# Patient Record
Sex: Male | Born: 1960 | Race: White | Hispanic: No | Marital: Single | Smoking: Never smoker
Health system: Southern US, Community
[De-identification: ages and names within clinical notes are randomized; demographics above are authoritative.]

## PROBLEM LIST (undated history)

## (undated) DIAGNOSIS — J45909 Unspecified asthma, uncomplicated: Secondary | ICD-10-CM

## (undated) DIAGNOSIS — I10 Essential (primary) hypertension: Secondary | ICD-10-CM

## (undated) DIAGNOSIS — K760 Fatty (change of) liver, not elsewhere classified: Secondary | ICD-10-CM

## (undated) DIAGNOSIS — R37 Sexual dysfunction, unspecified: Secondary | ICD-10-CM

## (undated) DIAGNOSIS — E785 Hyperlipidemia, unspecified: Secondary | ICD-10-CM

## (undated) DIAGNOSIS — E669 Obesity, unspecified: Secondary | ICD-10-CM

## (undated) DIAGNOSIS — E559 Vitamin D deficiency, unspecified: Secondary | ICD-10-CM

## (undated) DIAGNOSIS — E119 Type 2 diabetes mellitus without complications: Secondary | ICD-10-CM

## (undated) DIAGNOSIS — D497 Neoplasm of unspecified behavior of endocrine glands and other parts of nervous system: Secondary | ICD-10-CM

## (undated) DIAGNOSIS — F5104 Psychophysiologic insomnia: Secondary | ICD-10-CM

## (undated) DIAGNOSIS — R7303 Prediabetes: Secondary | ICD-10-CM

## (undated) DIAGNOSIS — K219 Gastro-esophageal reflux disease without esophagitis: Secondary | ICD-10-CM

## (undated) DIAGNOSIS — R040 Epistaxis: Secondary | ICD-10-CM

## (undated) HISTORY — DX: Unspecified asthma, uncomplicated: J45.909

## (undated) HISTORY — DX: Fatty (change of) liver, not elsewhere classified: K76.0

## (undated) HISTORY — DX: Gastro-esophageal reflux disease without esophagitis: K21.9

## (undated) HISTORY — DX: Hyperlipidemia, unspecified: E78.5

## (undated) HISTORY — DX: Neoplasm of unspecified behavior of endocrine glands and other parts of nervous system: D49.7

## (undated) HISTORY — DX: Psychophysiologic insomnia: F51.04

## (undated) HISTORY — DX: Epistaxis: R04.0

## (undated) HISTORY — DX: Essential (primary) hypertension: I10

## (undated) HISTORY — PX: NOSE SURGERY: SHX723

## (undated) HISTORY — DX: Obesity, unspecified: E66.9

## (undated) HISTORY — DX: Vitamin D deficiency, unspecified: E55.9

## (undated) HISTORY — PX: COLONOSCOPY: SHX174

## (undated) HISTORY — DX: Sexual dysfunction, unspecified: R37

## (undated) HISTORY — DX: Prediabetes: R73.03

## (undated) HISTORY — PX: HX OTHER: 2100001105

## (undated) HISTORY — PX: HX SHOULDER SURGERY: 2100001311

---

## 2002-05-17 HISTORY — PX: HX OTHER: 2100001105

## 2008-10-29 ENCOUNTER — Ambulatory Visit (HOSPITAL_COMMUNITY): Payer: Self-pay

## 2013-05-30 DIAGNOSIS — S93499A Sprain of other ligament of unspecified ankle, initial encounter: Secondary | ICD-10-CM

## 2013-05-30 HISTORY — DX: Sprain of other ligament of unspecified ankle, initial encounter: S93.499A

## 2016-03-30 DIAGNOSIS — M25521 Pain in right elbow: Secondary | ICD-10-CM | POA: Insufficient documentation

## 2016-03-30 HISTORY — DX: Pain in right elbow: M25.521

## 2016-09-28 DIAGNOSIS — M170 Bilateral primary osteoarthritis of knee: Secondary | ICD-10-CM | POA: Insufficient documentation

## 2017-09-13 DIAGNOSIS — K5900 Constipation, unspecified: Secondary | ICD-10-CM

## 2017-09-13 HISTORY — DX: Constipation, unspecified: K59.00

## 2017-12-13 DIAGNOSIS — S56519A Strain of other extensor muscle, fascia and tendon at forearm level, unspecified arm, initial encounter: Secondary | ICD-10-CM | POA: Insufficient documentation

## 2017-12-13 HISTORY — DX: Strain of other extensor muscle, fascia and tendon at forearm level, unspecified arm, initial encounter: S56.519A

## 2017-12-15 HISTORY — PX: ELBOW SURGERY: SHX618

## 2017-12-28 DIAGNOSIS — M7542 Impingement syndrome of left shoulder: Secondary | ICD-10-CM

## 2017-12-28 HISTORY — DX: Impingement syndrome of left shoulder: M75.42

## 2018-02-15 ENCOUNTER — Other Ambulatory Visit (INDEPENDENT_AMBULATORY_CARE_PROVIDER_SITE_OTHER): Payer: Self-pay | Admitting: Family Medicine

## 2018-02-15 NOTE — Telephone Encounter (Signed)
01/28/2018 1 11/30/2017 ZOLPIDEM TARTRATE 10 MG TABLET 21.0 21 RA TAL [id number] [facility] (4106) 3 Medicare [place]     01/18/2018 1 01/18/2018 OXYCODONE-ACETAMINOPHEN 10-325 84.0 28 JA BRE [id number] [facility] ([id number]    [place] checked provider does not need to review.

## 2018-02-15 NOTE — Telephone Encounter (Signed)
zolpidem (AMBIEN) 10 mg Oral Tablet          Sig: take 1 tablet by mouth at bedtime if needed    Disp:  21 Tab  Refills:  3     [facility] to [facility] [place].

## 2018-03-06 ENCOUNTER — Encounter (INDEPENDENT_AMBULATORY_CARE_PROVIDER_SITE_OTHER): Payer: Self-pay

## 2018-03-14 LAB — COMPREHENSIVE METABOLIC PANEL
ALBUMIN: 4.6 g/dL
ALKALINE PHOSPHATASE: 66 IU/L
ALT (SGPT): 37 IU/L
AST (SGOT): 22 IU/L
BILIRUBIN, TOTAL: 1.5 mg/dL
BUN: 13 mg/dL
CALCIUM: 9.7 mg/dL
CARBON DIOXIDE, TOTAL: 29 mmol/L
CHLORIDE: 99 mmol/L
CREATININE: 1 mg/dL
GLUCOSE: 104 mg/dL
POTASSIUM: 3.3 mmol/L
PROTEIN, TOTAL: 7.5 g/dL
SODIUM: 137 mmol/L

## 2018-03-14 LAB — VITAMIN D 25 HYDROXY: VITAMIN D 25 HYDROXY: 43

## 2018-03-14 LAB — HEPATITIS C ANTIBODY: HCV ANTIBODY QUALITATIVE: NONREACTIVE

## 2018-03-15 ENCOUNTER — Encounter (INDEPENDENT_AMBULATORY_CARE_PROVIDER_SITE_OTHER): Payer: Self-pay | Admitting: Family Medicine

## 2018-03-16 ENCOUNTER — Encounter (INDEPENDENT_AMBULATORY_CARE_PROVIDER_SITE_OTHER): Payer: Self-pay | Admitting: Family Medicine

## 2018-03-21 ENCOUNTER — Encounter (INDEPENDENT_AMBULATORY_CARE_PROVIDER_SITE_OTHER): Payer: Self-pay | Admitting: Family Medicine

## 2018-03-21 DIAGNOSIS — J4530 Mild persistent asthma, uncomplicated: Secondary | ICD-10-CM

## 2018-03-21 DIAGNOSIS — M47816 Spondylosis without myelopathy or radiculopathy, lumbar region: Secondary | ICD-10-CM

## 2018-03-21 DIAGNOSIS — F5101 Primary insomnia: Secondary | ICD-10-CM | POA: Insufficient documentation

## 2018-03-21 DIAGNOSIS — M542 Cervicalgia: Secondary | ICD-10-CM | POA: Insufficient documentation

## 2018-03-21 DIAGNOSIS — IMO0001 Reserved for inherently not codable concepts without codable children: Secondary | ICD-10-CM | POA: Insufficient documentation

## 2018-03-21 DIAGNOSIS — I10 Essential (primary) hypertension: Secondary | ICD-10-CM | POA: Insufficient documentation

## 2018-03-21 DIAGNOSIS — E559 Vitamin D deficiency, unspecified: Secondary | ICD-10-CM | POA: Insufficient documentation

## 2018-03-21 DIAGNOSIS — M549 Dorsalgia, unspecified: Secondary | ICD-10-CM

## 2018-03-21 DIAGNOSIS — K2960 Other gastritis without bleeding: Secondary | ICD-10-CM | POA: Insufficient documentation

## 2018-03-21 DIAGNOSIS — R079 Chest pain, unspecified: Secondary | ICD-10-CM | POA: Insufficient documentation

## 2018-03-21 DIAGNOSIS — G8929 Other chronic pain: Secondary | ICD-10-CM | POA: Insufficient documentation

## 2018-03-21 DIAGNOSIS — E785 Hyperlipidemia, unspecified: Secondary | ICD-10-CM | POA: Insufficient documentation

## 2018-03-21 DIAGNOSIS — S93499A Sprain of other ligament of unspecified ankle, initial encounter: Secondary | ICD-10-CM | POA: Insufficient documentation

## 2018-03-21 DIAGNOSIS — M25559 Pain in unspecified hip: Secondary | ICD-10-CM | POA: Insufficient documentation

## 2018-03-21 DIAGNOSIS — E782 Mixed hyperlipidemia: Secondary | ICD-10-CM | POA: Insufficient documentation

## 2018-03-21 DIAGNOSIS — E669 Obesity, unspecified: Secondary | ICD-10-CM | POA: Insufficient documentation

## 2018-03-21 HISTORY — DX: Pain in unspecified hip: M25.559

## 2018-03-21 HISTORY — DX: Gilbert syndrome: E80.4

## 2018-03-21 HISTORY — DX: Spondylosis without myelopathy or radiculopathy, lumbar region: M47.816

## 2018-03-21 HISTORY — DX: Cervicalgia: M54.2

## 2018-03-21 HISTORY — DX: Other chronic pain: G89.29

## 2018-03-21 HISTORY — DX: Chest pain, unspecified: R07.9

## 2018-03-21 HISTORY — DX: Dorsalgia, unspecified: M54.9

## 2018-03-21 HISTORY — DX: Mild persistent asthma, uncomplicated: J45.30

## 2018-03-21 HISTORY — DX: Reserved for inherently not codable concepts without codable children: IMO0001

## 2018-03-22 ENCOUNTER — Ambulatory Visit (INDEPENDENT_AMBULATORY_CARE_PROVIDER_SITE_OTHER): Payer: Medicare Other | Admitting: Family Medicine

## 2018-03-22 ENCOUNTER — Encounter (INDEPENDENT_AMBULATORY_CARE_PROVIDER_SITE_OTHER): Payer: Self-pay | Admitting: Family Medicine

## 2018-03-22 VITALS — BP 118/78 | HR 88 | Temp 98.3°F | Ht 69.0 in | Wt 236.2 lb

## 2018-03-22 DIAGNOSIS — R7303 Prediabetes: Secondary | ICD-10-CM

## 2018-03-22 DIAGNOSIS — I10 Essential (primary) hypertension: Principal | ICD-10-CM

## 2018-03-22 DIAGNOSIS — J029 Acute pharyngitis, unspecified: Secondary | ICD-10-CM

## 2018-03-22 DIAGNOSIS — F5101 Primary insomnia: Secondary | ICD-10-CM

## 2018-03-22 DIAGNOSIS — E785 Hyperlipidemia, unspecified: Secondary | ICD-10-CM

## 2018-03-22 DIAGNOSIS — M7918 Myalgia, other site: Secondary | ICD-10-CM

## 2018-03-22 DIAGNOSIS — M170 Bilateral primary osteoarthritis of knee: Secondary | ICD-10-CM

## 2018-03-22 MED ORDER — PHENOL 1.4 % MUCOSAL AEROSOL SPRAY
1.0000 | INHALATION_SPRAY | 3 refills | Status: DC | PRN
Start: 2018-03-22 — End: 2018-10-04
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
## 2018-04-07 ENCOUNTER — Encounter (INDEPENDENT_AMBULATORY_CARE_PROVIDER_SITE_OTHER): Payer: Self-pay | Admitting: Family Medicine

## 2018-04-20 ENCOUNTER — Telehealth (INDEPENDENT_AMBULATORY_CARE_PROVIDER_SITE_OTHER): Payer: Self-pay | Admitting: Family Medicine

## 2018-04-20 DIAGNOSIS — G894 Chronic pain syndrome: Secondary | ICD-10-CM

## 2018-04-20 NOTE — Telephone Encounter (Signed)
Pt is needing a new order for PT for his neck, lower back and right shoulder.   This gets sent to [facility] here in [place]. His next apt is tomorrow.

## 2018-04-21 NOTE — Telephone Encounter (Signed)
Ordered

## 2018-04-21 NOTE — Telephone Encounter (Signed)
Pt called back.  Made patient aware that referral was sent to [facility].

## 2018-04-21 NOTE — Telephone Encounter (Signed)
ORDER AND DEMO FOR PT FAXED TO [facility] IN [place] # [phone]- LM FOR PT

## 2018-04-24 ENCOUNTER — Telehealth (INDEPENDENT_AMBULATORY_CARE_PROVIDER_SITE_OTHER): Payer: Self-pay | Admitting: Internal Medicine

## 2018-04-24 ENCOUNTER — Other Ambulatory Visit (INDEPENDENT_AMBULATORY_CARE_PROVIDER_SITE_OTHER): Payer: Self-pay | Admitting: Family Medicine

## 2018-04-24 DIAGNOSIS — M170 Bilateral primary osteoarthritis of knee: Secondary | ICD-10-CM

## 2018-04-24 DIAGNOSIS — M7918 Myalgia, other site: Secondary | ICD-10-CM

## 2018-04-24 NOTE — Telephone Encounter (Signed)
Patient calling for new orders for PT, [facility].  Said he received a letter from [facility] stating that he needs another referral.

## 2018-04-24 NOTE — Nursing Note (Signed)
Last filled 03-29-18 (90 tablets, 0 refills)  LV 03-28-18  NV 07-27-17

## 2018-04-25 NOTE — Telephone Encounter (Signed)
Ok to complete

## 2018-04-25 NOTE — Telephone Encounter (Signed)
Okay to continue physical therapy, may get order.  Thank you

## 2018-04-26 NOTE — Telephone Encounter (Signed)
New order generated per Dr.Talaman.  Attempted to call patient to determine which [facility] where order is to be sent, no answer, left message to call back.

## 2018-04-27 NOTE — Telephone Encounter (Addendum)
I FAXED ORDER AND DEMO TO [facility] IN [place]- FAX # [phone]- WE DO NOT DO REFERRALS FOR [facility], NOR DO WE DO THEM FOR PT- THEY GET AUTHORIZATIONS ON OWN - CHART STATES HE USES [facility] IN [place]
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
## 2018-05-11 ENCOUNTER — Other Ambulatory Visit (INDEPENDENT_AMBULATORY_CARE_PROVIDER_SITE_OTHER): Payer: Self-pay | Admitting: Family Medicine

## 2018-05-11 NOTE — Telephone Encounter (Signed)
Last filled- 02/15/18 #21, r-3  Refill rejected

## 2018-05-19 ENCOUNTER — Other Ambulatory Visit (INDEPENDENT_AMBULATORY_CARE_PROVIDER_SITE_OTHER): Payer: Self-pay | Admitting: Family Medicine

## 2018-05-19 NOTE — Telephone Encounter (Signed)
NV: 07/21/18  LV: 03/22/18      LF: 04/24/18 #90/0

## 2018-06-01 ENCOUNTER — Other Ambulatory Visit (INDEPENDENT_AMBULATORY_CARE_PROVIDER_SITE_OTHER): Payer: Self-pay | Admitting: Family Medicine

## 2018-06-01 NOTE — Telephone Encounter (Signed)
Reviewed active medication list. Parameters for refill met per departmental refill protocol. Refill sent electronically to pharmacy as noted in the signed orders section of this encounter. Order forwarded to the provider for co-signature.

## 2018-06-06 ENCOUNTER — Other Ambulatory Visit (INDEPENDENT_AMBULATORY_CARE_PROVIDER_SITE_OTHER): Payer: Self-pay | Admitting: Family Medicine

## 2018-06-07 ENCOUNTER — Telehealth (INDEPENDENT_AMBULATORY_CARE_PROVIDER_SITE_OTHER): Payer: Self-pay | Admitting: Family Medicine

## 2018-06-07 NOTE — Telephone Encounter (Signed)
Patient requesting refill of medication     Last ov 03/22/18  Next ov 07/21/18  Last filled  04/22/2018   1   02/15/2018   ZOLPIDEM TARTRATE 10 MG TABLET   21.0   21   RA TAL   [id number]   [facility] (4106)       I have reviewed the patient's controlled substance prescription history from the [place] and my orders/prescriptions reflect my decisions based on the patient's condition, reason for care, my evaluation, and PDMP data.  I queried the PDMP and no physician review of PDMP is required according to criteria. Pended prescription routed to the provider for review and signature.     Medication pended for signature if appropriate.

## 2018-06-07 NOTE — Telephone Encounter (Signed)
Please refer to refill encounter from 06-06-18

## 2018-06-07 NOTE — Telephone Encounter (Signed)
Please see message below.  It looks on the PDMP as if he was given 3 refills 1 it was filled in December.  Therefore he should not need to script.

## 2018-06-07 NOTE — Telephone Encounter (Signed)
When I look on the PDMP it looks as if the script he was given in December had 3 refills?

## 2018-06-07 NOTE — Telephone Encounter (Signed)
Patient called office asking why his Ambien was not filled yet.    Informed patient of the 72 hour policy.     Spoke with pharmicist from [facility].  They did fill a script on 04-22-18 for a 21 day supply.  The script does not have any refills.  It was the 3rd refill from the original script  Dated 02-15-18.      Please advise

## 2018-06-08 NOTE — Telephone Encounter (Signed)
Started.  I am misinterpreted the PDMP.  No advise identified.  Script sent to pharmacy.

## 2018-06-13 ENCOUNTER — Other Ambulatory Visit (INDEPENDENT_AMBULATORY_CARE_PROVIDER_SITE_OTHER): Payer: Self-pay | Admitting: Family Medicine

## 2018-06-13 NOTE — Telephone Encounter (Signed)
Last OV: 03/22/18  Scheduled OV: 07/21/18  Last Filled:  05/19/18 #90 r-1    Gabapentin pended for refill.

## 2018-06-13 NOTE — Telephone Encounter (Signed)
Gabapentin script sent.

## 2018-06-25 ENCOUNTER — Other Ambulatory Visit (INDEPENDENT_AMBULATORY_CARE_PROVIDER_SITE_OTHER): Payer: Self-pay | Admitting: Family

## 2018-06-26 ENCOUNTER — Telehealth (INDEPENDENT_AMBULATORY_CARE_PROVIDER_SITE_OTHER): Payer: Self-pay | Admitting: Family Medicine

## 2018-06-26 NOTE — Telephone Encounter (Signed)
Fax from [facility] - symbicort not covered.  Only medication covered is pulmicort.  All others require PA.  Will route to privider to determine if PA should be completed.     Fax scanned to chart.

## 2018-06-26 NOTE — Telephone Encounter (Signed)
Refill requested too soon.

## 2018-06-26 NOTE — Telephone Encounter (Signed)
Last filled 06-08-18 (21 tablets, 0 refills)    LV 03-22-18  NV 07-21-18      I have reviewed the patients controlled substance prescription history from the [place] web site. Physician review of [place]APDMP is not required. Pended prescription routed to provider for review and signature.

## 2018-07-01 ENCOUNTER — Other Ambulatory Visit (INDEPENDENT_AMBULATORY_CARE_PROVIDER_SITE_OTHER): Payer: Self-pay | Admitting: Family Medicine

## 2018-07-01 ENCOUNTER — Other Ambulatory Visit (INDEPENDENT_AMBULATORY_CARE_PROVIDER_SITE_OTHER): Payer: Self-pay | Admitting: Family

## 2018-07-03 NOTE — Telephone Encounter (Signed)
Albuterol script sent.

## 2018-07-03 NOTE — Telephone Encounter (Signed)
Patient requesting refill of medication     Last ov 03/22/18  Next ov 07/21/18  Last filled:  06/08/2018   1   06/08/2018   ZOLPIDEM TARTRATE 10 MG TABLET   21.0   21   TA M C   [id number]   [facility] (4106)   0     Medicare   [place]      I have reviewed the patient's controlled substance prescription history from the [place] and my orders/prescriptions reflect my decisions based on the patient's condition, reason for care, my evaluation, and PDMP data.  I queried the PDMP and no physician review of PDMP is required according to criteria. Pended prescription routed to the provider for review and signature.       Medication pended for signature if appropriate.

## 2018-07-03 NOTE — Telephone Encounter (Signed)
Patient requesting refill of medication     Last ov 03/22/18  Next ov 07/21/18    Medication pended for signature if appropriate.

## 2018-07-03 NOTE — Telephone Encounter (Signed)
Refill declined.  Too soon for refill.  Medication not to be used on a daily basis.

## 2018-07-04 NOTE — Telephone Encounter (Signed)
Spoke with patient and advised medication refused.  Patient yelling into phone "I have been taking this medication every night for years!  Without if I don't sleep at all!".  Asked patient if he had ever tried anything else for sleep. Patient says that he has tried two different medications in the past, one made him sick and the other did not work so Dr.Talaman placed him back on [place].     Patient says that he does require this medication nightly as he does not sleep without it.

## 2018-07-08 ENCOUNTER — Other Ambulatory Visit (INDEPENDENT_AMBULATORY_CARE_PROVIDER_SITE_OTHER): Payer: Self-pay | Admitting: Internal Medicine

## 2018-07-10 ENCOUNTER — Telehealth (INDEPENDENT_AMBULATORY_CARE_PROVIDER_SITE_OTHER): Payer: Self-pay | Admitting: Family Medicine

## 2018-07-10 NOTE — Telephone Encounter (Signed)
Gabapentin script sent.

## 2018-07-10 NOTE — Telephone Encounter (Signed)
Received a call from the patient asking about his Ambien medication.     Advised patient per telephone encounter from 07/04/18 that we are unable to refill as Dr. Lockie Pares determined it was too soon for the refill.    Patient began yelling at me stating his Ambien is gone and he only got a 21 day supply. I re-educated the patient that the medication is not to be used on a daily basis.     He began yelling again that he needs this medication.     I was advising that Dr. Carlisle Cater returns Wed and we could discuss this with her but at this time it would not be refilled.     The patient hung up.

## 2018-07-10 NOTE — Telephone Encounter (Signed)
NV: 07/21/18  LV: 03/22/18     LF: 06/13/18 #90/0

## 2018-07-12 ENCOUNTER — Other Ambulatory Visit (INDEPENDENT_AMBULATORY_CARE_PROVIDER_SITE_OTHER): Payer: Self-pay | Admitting: Family Medicine

## 2018-07-12 MED ORDER — ZOLPIDEM 10 MG TABLET
ORAL_TABLET | ORAL | 0 refills | Status: DC
Start: 2018-07-12 — End: 2018-08-01

## 2018-07-12 NOTE — Telephone Encounter (Signed)
Patient called requesting refill of Ambien. He was told while Dr. Carlisle Cater was out that he would have to await her return. Medication pended.     Last OV: 03/22/18  Scheduled OV: 07/21/18  Last Filled:  06/08/18 #21 r-0    I have reviewed the patient's controlled substance prescription history from the [place] and my orders/prescriptions reflect my decisions based on the patient's condition, reason for care, my evaluation, and PDMP data.  I queried the PDMP and no physician review of PDMP is required according to criteria. Pended prescription routed to MD for review and signature.

## 2018-07-17 ENCOUNTER — Other Ambulatory Visit (INDEPENDENT_AMBULATORY_CARE_PROVIDER_SITE_OTHER): Payer: Self-pay | Admitting: Family Medicine

## 2018-07-21 ENCOUNTER — Encounter (INDEPENDENT_AMBULATORY_CARE_PROVIDER_SITE_OTHER): Payer: Self-pay | Admitting: Family Medicine

## 2018-07-29 ENCOUNTER — Other Ambulatory Visit (INDEPENDENT_AMBULATORY_CARE_PROVIDER_SITE_OTHER): Payer: Self-pay | Admitting: Family Medicine

## 2018-08-01 ENCOUNTER — Other Ambulatory Visit (INDEPENDENT_AMBULATORY_CARE_PROVIDER_SITE_OTHER): Payer: Self-pay | Admitting: Family Medicine

## 2018-08-01 ENCOUNTER — Telehealth (INDEPENDENT_AMBULATORY_CARE_PROVIDER_SITE_OTHER): Payer: Self-pay | Admitting: Family Medicine

## 2018-08-01 MED ORDER — OMEPRAZOLE 40 MG CAPSULE,DELAYED RELEASE: Cap | ORAL | 1 refills | 0 days | Status: CN

## 2018-08-01 MED ORDER — OMEPRAZOLE 40 MG CAPSULE,DELAYED RELEASE: Cap | ORAL | 0 refills | 0 days | Status: AC

## 2018-08-01 NOTE — Telephone Encounter (Signed)
I will refill his Prilosec and also agrees with going to the ER for further evaluation and management.  Thank you

## 2018-08-01 NOTE — Telephone Encounter (Signed)
Reviewed active medication list.  Parameters for refill/s met per departmental refill protocol.  Refill sent electronically/called into pharmacy as noted in the signed orders section of this encounter.  Order forwarded to provider for co-signature.

## 2018-08-01 NOTE — Telephone Encounter (Signed)
Last filled 07-12-18 (21 tablets, 0 refills)  LV 07-21-18  NV 10-04-18      I have reviewed the patients controlled substance prescription history from the PAPDMP web site. Physician review of [place]DMP is not required. Pended prescription routed to provider for review and signature.

## 2018-08-01 NOTE — Telephone Encounter (Signed)
LMOM for patient to return call.

## 2018-08-01 NOTE — Telephone Encounter (Signed)
Call from patient, states that for the last 2 weeks he has been experiencing stomach pain and throat "burning" and says that he needs his Prilosec refilled because he has been out of refills. Prilosec sent in another encounter.     Also, patient says that for over two weeks he has been having episodes of chest tightness and pain, diaphoresis, fatigue and shortness of breath with activities.  Patient says that when he has this experience he tries to use inhalers and these do not help.  Advised patient to seek care at ED as these symptoms could be cardiac in nature.  Patient states that he does not want to go to ED.  Did again encourage him to go to ED to be examined.  Patient states "okay" and disconnected call.

## 2018-08-02 NOTE — Telephone Encounter (Signed)
Called patient, no answer, LMOM for patient to call office back.     Unable to contact letter mailed.

## 2018-08-04 ENCOUNTER — Other Ambulatory Visit (INDEPENDENT_AMBULATORY_CARE_PROVIDER_SITE_OTHER): Payer: Self-pay | Admitting: Internal Medicine

## 2018-08-04 NOTE — Telephone Encounter (Signed)
Last filled 07-10-18 (90 tablets, 0 refills)  LV 03-22-18  NV 10-04-18

## 2018-08-11 ENCOUNTER — Other Ambulatory Visit (INDEPENDENT_AMBULATORY_CARE_PROVIDER_SITE_OTHER): Payer: Self-pay | Admitting: Family Medicine

## 2018-08-11 NOTE — Telephone Encounter (Signed)
Patient requesting refill of medication     Last ov 03/22/2018  Next ov 10/04/18    Medication pended for signature if appropriate.

## 2018-08-15 ENCOUNTER — Telehealth (INDEPENDENT_AMBULATORY_CARE_PROVIDER_SITE_OTHER): Payer: Self-pay | Admitting: Family Medicine

## 2018-08-15 DIAGNOSIS — R05 Cough: Principal | ICD-10-CM

## 2018-08-15 DIAGNOSIS — R059 Cough, unspecified: Secondary | ICD-10-CM

## 2018-08-15 DIAGNOSIS — R6883 Chills (without fever): Secondary | ICD-10-CM

## 2018-08-15 DIAGNOSIS — R0602 Shortness of breath: Secondary | ICD-10-CM

## 2018-08-15 NOTE — Telephone Encounter (Signed)
Please schedule telephone visit. May get covid test. Thank you.

## 2018-08-15 NOTE — Telephone Encounter (Signed)
URI    Symptoms:   Cough? yes    Productive? sometimes    Sputum Color? green   Congestion? yes   Fever? sometimes    Duration:   How long have symptoms been present? For a couple weeks    Treatment:   Have you tried anything over the counter? yes    If yes, what? variety   Treated at a Hospital or Urgent Care? no    If yes, where and when?    Allergies: no    Pharmacy: [facility] [address]    Additional documentation:  Shortness of breathe  fatique  Body aches  Chills  Traveled to [place] last week.    Ask about having testing.

## 2018-08-15 NOTE — Telephone Encounter (Signed)
Please schedule patient for appointment and advise script ordered. Can be faxed to [facility] testing site at [facility].

## 2018-08-15 NOTE — Telephone Encounter (Signed)
Faxed orders to registration for the patient, also patient is scheduled for a virtual visit on 08/16/18 at 8:30 am with Dr. Carlisle Cater.

## 2018-08-16 ENCOUNTER — Telehealth (INDEPENDENT_AMBULATORY_CARE_PROVIDER_SITE_OTHER): Payer: Self-pay | Admitting: Family Medicine

## 2018-08-16 ENCOUNTER — Other Ambulatory Visit: Payer: Self-pay

## 2018-08-16 ENCOUNTER — Telehealth (INDEPENDENT_AMBULATORY_CARE_PROVIDER_SITE_OTHER): Payer: Medicare Other | Admitting: Family Medicine

## 2018-08-16 DIAGNOSIS — R0789 Other chest pain: Secondary | ICD-10-CM

## 2018-08-16 DIAGNOSIS — R0602 Shortness of breath: Secondary | ICD-10-CM

## 2018-08-16 DIAGNOSIS — J4541 Moderate persistent asthma with (acute) exacerbation: Secondary | ICD-10-CM

## 2018-08-16 DIAGNOSIS — R1084 Generalized abdominal pain: Secondary | ICD-10-CM

## 2018-08-16 MED ORDER — BUDESONIDE-FORMOTEROL HFA 160 MCG-4.5 MCG/ACTUATION AEROSOL INHALER
2.00 | INHALATION_SPRAY | Freq: Two times a day (BID) | RESPIRATORY_TRACT | 3 refills | Status: DC
Start: 2018-08-16 — End: 2019-03-02

## 2018-08-16 MED ORDER — ZAFIRLUKAST 20 MG TABLET
20.00 mg | ORAL_TABLET | Freq: Two times a day (BID) | ORAL | 3 refills | Status: DC
Start: 2018-08-16 — End: 2018-12-07

## 2018-08-16 NOTE — Progress Notes (Signed)
Subjective:      Chief Complaint   Patient presents with   . Cough,Cold,Sore Throat   . Abdominal Pain   . COPD     Steve Young is an 58 y.o. male for evaluation over the phone. He has cough which is nonproductive now but states he had episode of cough with greenish phlegm. Onset of symptoms was a few weeks ago, gradually worsening since that time. He sometimes has fever. He has tried multiple OTC medications for cough. The cough is without wheezing or hemoptysis and is aggravated by exercise. Associated symptoms include chest pain, SOB, fatigue, body aches, chills and sore throat. He denies having palpitations or orthopnea. Patient does have a history of asthma. Patient does not have a history of environmental allergens. Patient has recent travel. He was in [place] about 1 and a half weeks ago. Patient does not have a history of smoking. Patient  had previous chest x-ray on 02/2018 which was negative. He states he is bothered by chronic right sided pain in his chest under his rib cage. He had normal Lexiscan cardiac test on 10/2015. He states he also has chronic generalized abdominal pain for several months. He describes the pain as achy and severe at times. He states he gets full easily and when he gets full his stomach hurts. He has no loss of appetite. He has no changes in bowel movements. He denies having significant weight loss. He has no nausea or vomiting. His colonoscopy is UTD and is due on 05/2019.    Past Medical History:   Diagnosis Date   . Asthma    . Esophageal reflux    . Hypertension    . Obesity    . Pure hyperglyceridemia    . 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 albuterol sulfate (PROVENTIL OR VENTOLIN OR PROAIR) 90 mcg/actuation Inhalation HFA Aerosol Inhaler inhale 1 puff by mouth every 4 hours if needed wheezing 18 g 1   . amLODIPine (NORVASC) 10 mg Oral Tablet Take 10 mg by mouth     . aspirin  (ECOTRIN) 81 mg Oral Tablet, Delayed Release (E.C.) Take 81 mg by mouth     . atorvastatin (LIPITOR) 80 mg Oral Tablet Take 80 mg by mouth     . cetirizine (ZYRTEC) 10 mg Oral Tablet Take 10 mg by mouth     . chlorthalidone (HYGROTON) 25 mg Oral Tablet Take 25 mg by mouth     . cholecalciferol, vitamin D3, 5,000 unit Oral Capsule Take 5,000 Units by mouth     . gabapentin (NEURONTIN) 600 mg Oral Tablet take 1 tablet by mouth three times a day 90 Tab 0   . losartan (COZAAR) 100 mg Oral Tablet Take 100 mg by mouth     . magnesium oxide (MAG-OX) 400 mg (241.3 mg magnesium) Oral Tablet Take 400 mg by mouth     . methocarbamol (ROBAXIN) 500 mg Oral Tablet      . naproxen (NAPROSYN) 500 mg Oral Tablet Take 500 mg by mouth     . omega-3 fatty acid (LOVAZA) 1 gram Oral Capsule Take 2 g by mouth     . omeprazole (PRILOSEC) 40 mg Oral Capsule, Delayed Release(E.C.) take 1 capsule by mouth once daily 60 Cap 0   . oxyCODONE-acetaminophen (PERCOCET) 10-325 mg Oral Tablet take 1 tablet by mouth three times a day if  needed for pain  0   . phenol 1.4% (CHLORASEPTIC THROAT SPRAY) 1.4 % Mucous Membrane Aerosol, Spray 1 Spray by Mouth/Throat route Every 4 hours as needed 1 Bottle 3   . polyethylene glycol (MIRALAX) 17 gram Oral Powder in Packet Take 17 g by mouth     . SYMBICORT 80-4.5 mcg/actuation Inhalation HFA Aerosol Inhaler inhale 2 puffs by mouth twice a day 10.2 g 3   . zolpidem (AMBIEN) 10 mg Oral Tablet take 1 tablet by mouth at bedtime if needed 21 Tab 0     No Known Allergies     Social History     Tobacco Use   . Smoking status: Never Smoker   . Smokeless tobacco: Never Used   Substance Use Topics   . Alcohol use: Not Currently     Review of Systems  Other than ROS in the HPI, all other systems were negative.    Objective:     He was alert and oriented. He had no distress over the phone. No wheezing when talking. Cooperative. Speech clear and coherent.     Assessment:       ICD-10-CM    1. SOB (shortness of breath) R06.02  ECG 12-LEAD     XR CHEST PA AND LATERAL     PULMONARY FUNCTION TESTING - ADULT  He will get COVID-19 screening ordered yesterday.   2. Chest discomfort R07.89 ECG 12-LEAD     XR CHEST PA AND LATERAL   3. Generalized abdominal pain R10.84 US ABDOMEN COMPLETE (ADULT)   4. Moderate persistent asthma with acute exacerbation J45.41 budesonide-formoteroL (SYMBICORT) 160-4.5 mcg/actuation Inhalation HFA Aerosol Inhaler - dose increased     zafirlukast (ACCOLATE) 20 mg Oral Tablet - new     PULMONARY FUNCTION TESTING - ADULT     Orders Placed This Encounter   . XR CHEST PA AND LATERAL   . US ABDOMEN COMPLETE (ADULT)   . ECG 12-LEAD   . PULMONARY FUNCTION TESTING - ADULT   . budesonide-formoteroL (SYMBICORT) 160-4.5 mcg/actuation Inhalation HFA Aerosol Inhaler   . zafirlukast (ACCOLATE) 20 mg Oral Tablet       Plan:     1. CXR. Full PFT. Increased dose of Symbicort. Added Accolate.  2. Call if with shortness of breath that worsens, blood in sputum, change in character of cough, development of fever or chills, inability to maintain nutrition and hydration. Avoid exposure to tobacco smoke, fumes.  3. Call sooner as needed.   Medications as ordered. Medication side effects discussed and patient verbalized understanding. Patient agrees with above treatment. Questions answered to patients satisfaction. Risk, benefits, and alternatives to above treatment discussed with patient.   Stop new medication and call office if with adverse reactions.  He  was advised to contact the office with any questions or concerns.  Time spent with patient was 16:18 minutes.

## 2018-08-16 NOTE — Telephone Encounter (Signed)
Called the patient to let him know that his [place] is scheduled at the [facility] on 08/17/18 7:30 am arrival, 8 am test.  Also informed the patient he is to have Covid-19, xray & EKG at the same time.  Faxed these orders to registration.    He is scheduled for his PFT's at the Hospital on 10/17/18 8:15 am arrival, 8:45 am test.  I am sending this order along with his AVS in the mail.    Patient is aware and understands.

## 2018-08-16 NOTE — Telephone Encounter (Signed)
[facility] called stated pt was scheduled for [place] they had to reschedule pt for may 19 th because pt is getting tested for covid. Please advise

## 2018-08-16 NOTE — Telephone Encounter (Signed)
Placed orders for patient. Covid test, PFT, Chest XR, EKG and abdominal US. Please send. Thank you.

## 2018-08-17 ENCOUNTER — Other Ambulatory Visit (INDEPENDENT_AMBULATORY_CARE_PROVIDER_SITE_OTHER): Payer: Self-pay | Admitting: Family Medicine

## 2018-08-18 ENCOUNTER — Telehealth (INDEPENDENT_AMBULATORY_CARE_PROVIDER_SITE_OTHER): Payer: Self-pay | Admitting: Family Medicine

## 2018-08-18 MED ORDER — IBUPROFEN 800 MG TABLET
800.00 mg | ORAL_TABLET | Freq: Three times a day (TID) | ORAL | 0 refills | Status: DC | PRN
Start: 2018-08-18 — End: 2018-10-04

## 2018-08-18 NOTE — Telephone Encounter (Signed)
Reviewed active medication list.  Parameters for refill met per departmental refill protocol.  Refill sent electronically to pharmacy as noted in the signed orders section of this encounter.  Order forwarded to provider for co-signature.

## 2018-08-18 NOTE — Telephone Encounter (Signed)
May discontinue Naproxen if he is to take Ibuprofen. Thank you.

## 2018-08-18 NOTE — Telephone Encounter (Signed)
Attempted to contact patient.  Left message to return call to office.

## 2018-08-18 NOTE — Telephone Encounter (Signed)
PT CALLED AND MADE AWARE AND VOICED UNDERSTANDING

## 2018-08-18 NOTE — Telephone Encounter (Signed)
Patient requesting Ibuprofen 800mg   States he is now cutting grass again and it causes his back to ache    Please advise

## 2018-08-19 ENCOUNTER — Other Ambulatory Visit (INDEPENDENT_AMBULATORY_CARE_PROVIDER_SITE_OTHER): Payer: Self-pay | Admitting: Family Medicine

## 2018-08-21 ENCOUNTER — Other Ambulatory Visit (INDEPENDENT_AMBULATORY_CARE_PROVIDER_SITE_OTHER): Payer: Self-pay | Admitting: Family Medicine

## 2018-08-21 NOTE — Telephone Encounter (Signed)
Reviewed active medication list.  Parameters for refill met per departmental refill protocol.  Refill sent electronically to pharmacy as noted in the signed orders section of this encounter.  Order forwarded to provider for co-signature.

## 2018-08-21 NOTE — Telephone Encounter (Signed)
Last filled- 08/01/18  Last OV- 03/22/18, Scheduled OV- 09/20/18    I have reviewed the patient's controlled substance prescription history from the [place] PDMP and my orders/prescriptions reflect my decisions based on the patient's condition, reason for care, my evaluation, and PDMP data.  I queried the PDMP and no physician review of PDMP is required according to criteria. Pended prescription routed to provider for review and signature.

## 2018-08-28 ENCOUNTER — Other Ambulatory Visit (INDEPENDENT_AMBULATORY_CARE_PROVIDER_SITE_OTHER): Payer: Self-pay | Admitting: Internal Medicine

## 2018-08-28 NOTE — Telephone Encounter (Signed)
Last filled 08-24-18

## 2018-08-29 ENCOUNTER — Other Ambulatory Visit (INDEPENDENT_AMBULATORY_CARE_PROVIDER_SITE_OTHER): Payer: Self-pay | Admitting: Family Medicine

## 2018-08-29 NOTE — Telephone Encounter (Signed)
Patient requesting refill of medication     Last ov 08/16/2018 telephone visit   Next ov 10/04/2018    Medication pended for signature if appropriate.

## 2018-08-31 ENCOUNTER — Other Ambulatory Visit (INDEPENDENT_AMBULATORY_CARE_PROVIDER_SITE_OTHER): Payer: Self-pay | Admitting: Family Medicine

## 2018-08-31 DIAGNOSIS — R0602 Shortness of breath: Secondary | ICD-10-CM

## 2018-08-31 DIAGNOSIS — R05 Cough: Secondary | ICD-10-CM

## 2018-08-31 DIAGNOSIS — R6883 Chills (without fever): Secondary | ICD-10-CM

## 2018-08-31 DIAGNOSIS — R059 Cough, unspecified: Secondary | ICD-10-CM

## 2018-09-07 ENCOUNTER — Other Ambulatory Visit (INDEPENDENT_AMBULATORY_CARE_PROVIDER_SITE_OTHER): Payer: Self-pay | Admitting: Family Medicine

## 2018-09-07 NOTE — Telephone Encounter (Signed)
Last filled 08-21-18 (21 tablets, 0 refills)  LV 08-16-18  NV 10-04-18    Reviewed active medication list. Parameters for refill/s met per department protocol.  Refill sent electronically/called into pharmacy as noted in the signed orders section of this encounter.  Order forwarded to provider for signature.

## 2018-09-22 ENCOUNTER — Other Ambulatory Visit (INDEPENDENT_AMBULATORY_CARE_PROVIDER_SITE_OTHER): Payer: Self-pay | Admitting: Internal Medicine

## 2018-09-22 NOTE — Telephone Encounter (Signed)
Reviewed active medication list.  Parameters for refill/s met per departmental refill protocol.  Refill sent electronically/called into pharmacy as noted in the signed orders section of this encounter.  Order forwarded to provider for co-signature.

## 2018-09-22 NOTE — Telephone Encounter (Signed)
Reviewed active medication list. Parameters for refill met per departmental refill protocol. Refill sent electronically to pharmacy as noted in the signed orders section of this encounter. Order forwarded to the provider for co-signature.

## 2018-09-23 ENCOUNTER — Other Ambulatory Visit (INDEPENDENT_AMBULATORY_CARE_PROVIDER_SITE_OTHER): Payer: Self-pay | Admitting: Family Medicine

## 2018-09-25 ENCOUNTER — Other Ambulatory Visit (INDEPENDENT_AMBULATORY_CARE_PROVIDER_SITE_OTHER): Payer: Self-pay | Admitting: Family Medicine

## 2018-09-25 NOTE — Telephone Encounter (Signed)
NV: 10/04/18  LV: 03/22/18    LF: 08/29/18 #90/0

## 2018-09-25 NOTE — Telephone Encounter (Signed)
NV: 10/04/18  LV: 03/22/18    LF: 09/07/18 #21

## 2018-10-04 ENCOUNTER — Telehealth (INDEPENDENT_AMBULATORY_CARE_PROVIDER_SITE_OTHER): Payer: Self-pay | Admitting: Family Medicine

## 2018-10-04 ENCOUNTER — Encounter (INDEPENDENT_AMBULATORY_CARE_PROVIDER_SITE_OTHER): Payer: Self-pay | Admitting: Family Medicine

## 2018-10-04 ENCOUNTER — Other Ambulatory Visit: Payer: Self-pay

## 2018-10-04 ENCOUNTER — Ambulatory Visit (INDEPENDENT_AMBULATORY_CARE_PROVIDER_SITE_OTHER): Payer: Medicare Other | Admitting: Family Medicine

## 2018-10-04 VITALS — BP 124/76 | HR 59 | Temp 96.0°F | Resp 16 | Ht 69.0 in | Wt 240.0 lb

## 2018-10-04 DIAGNOSIS — R109 Unspecified abdominal pain: Secondary | ICD-10-CM

## 2018-10-04 DIAGNOSIS — G8929 Other chronic pain: Secondary | ICD-10-CM

## 2018-10-04 DIAGNOSIS — I10 Essential (primary) hypertension: Secondary | ICD-10-CM

## 2018-10-04 DIAGNOSIS — J4540 Moderate persistent asthma, uncomplicated: Secondary | ICD-10-CM | POA: Insufficient documentation

## 2018-10-04 DIAGNOSIS — K760 Fatty (change of) liver, not elsewhere classified: Secondary | ICD-10-CM | POA: Insufficient documentation

## 2018-10-04 DIAGNOSIS — R911 Solitary pulmonary nodule: Secondary | ICD-10-CM

## 2018-10-04 DIAGNOSIS — M7918 Myalgia, other site: Secondary | ICD-10-CM

## 2018-10-04 DIAGNOSIS — R1084 Generalized abdominal pain: Secondary | ICD-10-CM

## 2018-10-04 DIAGNOSIS — K769 Liver disease, unspecified: Secondary | ICD-10-CM

## 2018-10-04 DIAGNOSIS — F5101 Primary insomnia: Secondary | ICD-10-CM

## 2018-10-04 DIAGNOSIS — R0781 Pleurodynia: Secondary | ICD-10-CM

## 2018-10-04 DIAGNOSIS — Z5181 Encounter for therapeutic drug level monitoring: Secondary | ICD-10-CM

## 2018-10-04 DIAGNOSIS — Z125 Encounter for screening for malignant neoplasm of prostate: Secondary | ICD-10-CM

## 2018-10-04 DIAGNOSIS — E785 Hyperlipidemia, unspecified: Secondary | ICD-10-CM

## 2018-10-04 DIAGNOSIS — K5903 Drug induced constipation: Secondary | ICD-10-CM

## 2018-10-04 HISTORY — DX: Fatty (change of) liver, not elsewhere classified: K76.0

## 2018-10-04 HISTORY — DX: Unspecified abdominal pain: R10.9

## 2018-10-04 HISTORY — DX: Other chronic pain: G89.29

## 2018-10-04 HISTORY — DX: Liver disease, unspecified: K76.9

## 2018-10-04 HISTORY — DX: Pleurodynia: R07.81

## 2018-10-04 MED ORDER — LUBIPROSTONE 24 MCG CAPSULE
24.0000 ug | ORAL_CAPSULE | Freq: Two times a day (BID) | ORAL | 3 refills | Status: DC
Start: 2018-10-04 — End: 2023-01-10

## 2018-10-04 NOTE — Telephone Encounter (Signed)
Faxed over referral to [facility]

## 2018-10-04 NOTE — Progress Notes (Signed)
Chief Complaint   Patient presents with   . Follow Up 6 Months     SUBJECTIVE:  Steve Young is a 58 y.o. male who presents for follow-up.  He is having chronic stomach abdominal discomfort which he describes as pressure.  He had abdominal ultrasound yesterday which showed diffuse hepatic steatosis and indeterminate hepatic lesion. He also has chronic rib pain which is not improving.  He had chest x-ray on 02/2018 which showed no acute cardiopulmonary pathology. There was a report of a nodule overlies the right 4th rib anteriorly and the right 5th rib posteriorly. The finding was similar to previous study on 10/09/2015, 12/08/2017 and 12/26/2017. He is worried because he has persistent pain. He has no recent chest trauma. He has chronic pain and goes to pain clinic. He has been having constipation from his pain medicine. His colonoscopy is up-to-date. The patient's hypertension is stable on current therapy. He is feeling well and denies any symptoms referable to his hypertension. He has hyperlipidemia and has no problems on current therapy. Compliance with treatment thus far has been good.  A fasting lipid profile was not done this year. Asthma is stable. He gets shortness of breath at times from his asthma. He has no increased use of his rescue inhaler. Insomnia is better with medication.  He would like to get prostate cancer screening in his next set of blood work. He denies the following symptoms today: persistent cough, wheezing, severe headache, vision changes, epistaxis, chest discomfort, TIA or stroke symptoms and palpitations. Side effects of medication: no other current medication side effects.     Current Outpatient Medications   Medication Sig   . albuterol sulfate (PROVENTIL OR VENTOLIN OR PROAIR) 90 mcg/actuation Inhalation HFA Aerosol Inhaler inhale 1 puff by mouth every 4 hours if needed wheezing   . amLODIPine (NORVASC) 10 mg Oral Tablet take 1 tablet by mouth once daily   . aspirin (ECOTRIN)  81 mg Oral Tablet, Delayed Release (E.C.) take 1 tablet by mouth once daily   . atorvastatin (LIPITOR) 80 mg Oral Tablet take 1 tablet by mouth at bedtime   . budesonide-formoteroL (SYMBICORT) 160-4.5 mcg/actuation Inhalation HFA Aerosol Inhaler Take 2 Puffs by inhalation Twice daily   . chlorthalidone (HYGROTON) 25 mg Oral Tablet take 1 tablet by mouth once daily   . cholecalciferol, vitamin D3, 125 mcg (5,000 unit) Oral Capsule take 1 capsule by mouth once daily   . gabapentin (NEURONTIN) 600 mg Oral Tablet take 1 tablet by mouth three times a day   . losartan (COZAAR) 100 mg Oral Tablet take 1 tablet by mouth once daily   . magnesium oxide (MAG-OX) 400 mg (241.3 mg magnesium) Oral Tablet Take 400 mg by mouth   . methocarbamol (ROBAXIN) 500 mg Oral Tablet    . NARCAN 4 mg/actuation Nasal Spray, Non-Aerosol INSTILL 1 SPRAY NASALLY AS NEEDED PER CDC RECOMMENDATIONS   . omega-3 fatty acid (LOVAZA) 1 gram Oral Capsule take 2 capsules by mouth twice a day   . omeprazole (PRILOSEC) 40 mg Oral Capsule, Delayed Release(E.C.) take 1 capsule by mouth once daily   . oxyCODONE-acetaminophen (PERCOCET) 10-325 mg Oral Tablet take 1 tablet by mouth three times a day if needed for pain   . zafirlukast (ACCOLATE) 20 mg Oral Tablet Take 1 Tab (20 mg total) by mouth Twice a day before meals   . zolpidem (AMBIEN) 10 mg Oral Tablet take 1 tablet by mouth at bedtime if needed  No Known Allergies     Past Surgical History:   Procedure Laterality Date   . COLONOSCOPY     . ELBOW SURGERY Left 12/2017   . HX OTHER  2004     SPINAL FUSION,ANT,EA ADNL LEVEL c3-c7     Social History     Tobacco Use   . Smoking status: Never Smoker   . Smokeless tobacco: Never Used   Substance Use Topics   . Alcohol use: Not Currently   . Drug use: Never     Family Medical History:     Family history is unknown by patient.        Past Medical History:   Diagnosis Date   . Asthma    . Esophageal reflux    . Hypertension    . Obesity    . Pure  hyperglyceridemia    . Tear of talofibular ligament 05/30/2013    RIGHT     Review of Systems:  Constitutional: No fever, no chills, no weight loss  ENT: No nasal congestion, No sore throat  Respiratory: No hemoptysis, No pleurisy  Cardiovascular: No chest pain, no palpitations  Gastrointestinal: No diarrhea, no vomiting  Musculoskeletal: No recent trauma, chronic pain  Neurologic: No seizure, no dizziness  Endocrine: No excessive urination r or thirst.  Integumentary : No rash, no pruritus  Psychiatric: No anger, no depression    OBJECTIVE:   The patient appears to be in no acute distress.  Vitals: BP 124/76 (Site: Left, Patient Position: Sitting, Cuff Size: Adult Large)   Pulse 59   Temp 35.6 C (96 F) (Tympanic)   Resp 16   Ht 1.753 m (5\' 9" )   Wt 109 kg (240 lb)   SpO2 96%   BMI 35.44 kg/m        General: alert, no acute distress  Head: normocephalic, atraumatic  Hearing: Hears conversational voices  Eye: EOM intact, normal conjunctiva  Nose: No discharge, no epistaxis  Mouth: Moist lips, no ulcer  Abdomen: Bowel sounds present, soft, protuberant, mild upper quadrant tenderness  Respiratory: Good diaphragmatic excursion. Lungs clear.  Heart: RRR  Extremities: Ambulatory, no edema  Neurologic: Awake, alert, oriented, speech clear and coherent  Skin: No jaundice, warm, no cyanosis  Psychiatric: Cooperative, pleasant, normal affect.    Blood Pressure treatment target: 140/90     ASSESSMENT:     ICD-10-CM    1. Chronic abdominal pain R10.9 COMPREHENSIVE METABOLIC PNL, FASTING    B04.88 CBC     CT CHEST ABDOMEN PELVIS W/WO IV CONTRAST     OUTSIDE CONSULT/REFERRAL PROVIDER(AMB) - gastroenterology   2. Essential hypertension I10 COMPREHENSIVE METABOLIC PNL, FASTING     CBC     LIPID PANEL     URIC ACID     MAGNESIUM     VITAMIN D  Follow a heart healthy diet, exercise as tolerated, weight loss.   3. Hyperlipidemia LDL goal <130 E78.5 COMPREHENSIVE METABOLIC PNL, FASTING     CBC     LIPID PANEL   4. Primary  insomnia F51.01 COMPREHENSIVE METABOLIC PNL, FASTING     CBC   5. Chronic musculoskeletal pain M79.18 NARCAN 4 mg/actuation Nasal Spray, Non-Aerosol    G89.29 COMPREHENSIVE METABOLIC PNL, FASTING     CBC     CT CHEST ABDOMEN PELVIS W/WO IV CONTRAST     URIC ACID     MAGNESIUM     VITAMIN D  Continue pain management.   6. Lung nodule (questionable) R91.1 COMPREHENSIVE METABOLIC  PNL, FASTING     CBC     CT CHEST ABDOMEN PELVIS W/WO IV CONTRAST   7. Hepatic steatosis K76.0 COMPREHENSIVE METABOLIC PNL, FASTING     CBC     OUTSIDE CONSULT/REFERRAL PROVIDER(AMB) - gastroenterology  Recommended weight loss.   8. Hepatic lesion K76.9 COMPREHENSIVE METABOLIC PNL, FASTING     CBC     CT CHEST ABDOMEN PELVIS W/WO IV CONTRAST     OUTSIDE CONSULT/REFERRAL PROVIDER(AMB) - gastroenterology   9. Rib pain R07.81 COMPREHENSIVE METABOLIC PNL, FASTING     CBC     CT CHEST ABDOMEN PELVIS W/WO IV CONTRAST   10. Drug-induced constipation K59.03 lubiprostone (AMITIZA) 24 mcg Oral Capsule - new   11. Medication monitoring encounter Z51.81 URIC ACID     MAGNESIUM     VITAMIN D   12. Screening PSA (prostate specific antigen) Z12.5 PSA SCREENING   13. Moderate persistent asthma without complication R42.70 COMPREHENSIVE METABOLIC PNL, FASTING     CBC       Orders Placed This Encounter   . CT CHEST ABDOMEN PELVIS W/WO IV CONTRAST   . COMPREHENSIVE METABOLIC PNL, FASTING   . CBC   . LIPID PANEL   . URIC ACID   . MAGNESIUM   . VITAMIN D   . PSA SCREENING   . OUTSIDE CONSULT/REFERRAL PROVIDER(AMB) - gastroenterology   . lubiprostone (AMITIZA) 24 mcg Oral Capsule     PLAN:  Medication as ordered. Medication side effects discussed and patient verbalized understanding. Patient agreed with above treatment. Questions answered to patient's satisfaction. Risk, benefits, and alternatives to above treatment discussed with patient. Continue other maintenance medications.  Follow-up in 3 months , sooner should new symptoms or problems arise.   Stop new  medication and call office if with adverse reactions.  He  was advised to contact the office if with any questions or concerns.

## 2018-10-04 NOTE — Nursing Note (Signed)
Patient here for follow up  Having chronic stomach trouble/pressure  Ribs hurt   Had [place] yesterday

## 2018-10-04 NOTE — Telephone Encounter (Addendum)
Patient is scheduled for CT chest Abdomen Pelvis w/wo.  Can you please get an Auth?    [employer]

## 2018-10-05 NOTE — Telephone Encounter (Signed)
Pending with insurance

## 2018-10-10 NOTE — Telephone Encounter (Signed)
CT Chest Abdomen Pelvis   Auth # CT Chest [id number]  Auth # CT [id number]/pelvis [id number]    Please schedule at [facility].

## 2018-10-11 ENCOUNTER — Telehealth (INDEPENDENT_AMBULATORY_CARE_PROVIDER_SITE_OTHER): Payer: Self-pay | Admitting: Family Medicine

## 2018-10-11 DIAGNOSIS — R109 Unspecified abdominal pain: Secondary | ICD-10-CM

## 2018-10-11 DIAGNOSIS — J4540 Moderate persistent asthma, uncomplicated: Secondary | ICD-10-CM

## 2018-10-11 DIAGNOSIS — G8929 Other chronic pain: Secondary | ICD-10-CM

## 2018-10-11 DIAGNOSIS — K769 Liver disease, unspecified: Secondary | ICD-10-CM

## 2018-10-11 DIAGNOSIS — R0781 Pleurodynia: Secondary | ICD-10-CM

## 2018-10-11 DIAGNOSIS — R911 Solitary pulmonary nodule: Secondary | ICD-10-CM

## 2018-10-11 NOTE — Telephone Encounter (Signed)
[facility] only does with or without contrast not both.    Dr. Carlisle Cater ordered w/o.    It is scheduled for 10/13/18 at the hospital at 6:30 am arrival, 8 am test.  Need to fast 3 hours prior to the testing.    Called and left a message for the patient to let him know the date and time of CT on answering machine.

## 2018-10-11 NOTE — Telephone Encounter (Signed)
Per Ruthann: [facility] will only complete CT Chest Abdomen Pelvis without contrast.     Spoke with [facility]: Reference [id number]    No change to auth numbers.

## 2018-10-16 ENCOUNTER — Telehealth (INDEPENDENT_AMBULATORY_CARE_PROVIDER_SITE_OTHER): Payer: Self-pay | Admitting: Family Medicine

## 2018-10-16 ENCOUNTER — Encounter (INDEPENDENT_AMBULATORY_CARE_PROVIDER_SITE_OTHER): Payer: Self-pay | Admitting: Family Medicine

## 2018-10-16 DIAGNOSIS — M7918 Myalgia, other site: Secondary | ICD-10-CM

## 2018-10-16 DIAGNOSIS — R0781 Pleurodynia: Secondary | ICD-10-CM

## 2018-10-16 DIAGNOSIS — R911 Solitary pulmonary nodule: Secondary | ICD-10-CM

## 2018-10-16 DIAGNOSIS — R109 Unspecified abdominal pain: Secondary | ICD-10-CM

## 2018-10-16 DIAGNOSIS — K769 Liver disease, unspecified: Secondary | ICD-10-CM

## 2018-10-16 DIAGNOSIS — J4540 Moderate persistent asthma, uncomplicated: Secondary | ICD-10-CM

## 2018-10-16 DIAGNOSIS — G8929 Other chronic pain: Secondary | ICD-10-CM

## 2018-10-16 NOTE — Telephone Encounter (Signed)
Patient called asking for results of CT Scan completed on 10/13/18.    Results in chart for review.

## 2018-10-16 NOTE — Telephone Encounter (Signed)
No acute process.  Fatty liver noted and calcifications in the blood vessel of heart.  Continue statin therapy. Will target LDL goal of less than 70. Please get the blood work ordered if not done yet.  Thank you

## 2018-10-16 NOTE — Telephone Encounter (Signed)
Patient informed of results and verbalized understanding.

## 2018-10-18 ENCOUNTER — Encounter (INDEPENDENT_AMBULATORY_CARE_PROVIDER_SITE_OTHER): Payer: Self-pay | Admitting: Family Medicine

## 2018-10-20 ENCOUNTER — Other Ambulatory Visit (INDEPENDENT_AMBULATORY_CARE_PROVIDER_SITE_OTHER): Payer: Self-pay | Admitting: Family Medicine

## 2018-10-20 NOTE — Telephone Encounter (Signed)
Ambien last filled 09-27-18 (21 tablets, 0 refills)  Gabapentin last filled 09-25-18 (90 tablets, 0 refills)  LV 10-04-18  NV 01-04-19      I have reviewed the patients controlled substance prescription history from the [place] web site. Physician review of [place]MP is not required. Pended prescription routed to provider for review and signature.

## 2018-10-23 ENCOUNTER — Encounter (INDEPENDENT_AMBULATORY_CARE_PROVIDER_SITE_OTHER): Payer: Self-pay | Admitting: Family Medicine

## 2018-10-24 ENCOUNTER — Other Ambulatory Visit (INDEPENDENT_AMBULATORY_CARE_PROVIDER_SITE_OTHER): Payer: Self-pay | Admitting: Family Medicine

## 2018-10-24 DIAGNOSIS — R0602 Shortness of breath: Secondary | ICD-10-CM

## 2018-10-24 DIAGNOSIS — J4541 Moderate persistent asthma with (acute) exacerbation: Secondary | ICD-10-CM

## 2018-11-03 ENCOUNTER — Other Ambulatory Visit (INDEPENDENT_AMBULATORY_CARE_PROVIDER_SITE_OTHER): Payer: Self-pay | Admitting: Family Medicine

## 2018-11-03 NOTE — Telephone Encounter (Signed)
Script sent 08-18-18 (90 capsules, 1 refill)

## 2018-11-05 ENCOUNTER — Other Ambulatory Visit (INDEPENDENT_AMBULATORY_CARE_PROVIDER_SITE_OTHER): Payer: Self-pay | Admitting: Family Medicine

## 2018-11-10 ENCOUNTER — Encounter (INDEPENDENT_AMBULATORY_CARE_PROVIDER_SITE_OTHER): Payer: Self-pay | Admitting: Family Medicine

## 2018-11-19 ENCOUNTER — Other Ambulatory Visit (INDEPENDENT_AMBULATORY_CARE_PROVIDER_SITE_OTHER): Payer: Self-pay | Admitting: Family Medicine

## 2018-11-20 NOTE — Telephone Encounter (Signed)
NV: 01/04/19  LV: 10/04/18    LF:   Omeprazole: 08/01/18 #60/0  Gabapentin: 10/20/18 #90/0

## 2018-11-22 ENCOUNTER — Other Ambulatory Visit (INDEPENDENT_AMBULATORY_CARE_PROVIDER_SITE_OTHER): Payer: Self-pay | Admitting: Family Medicine

## 2018-11-22 NOTE — Telephone Encounter (Signed)
Reviewed active medication list. Parameters for refill/s met per department protocol.  Refill sent electronically/called into pharmacy as noted in the signed orders section of this encounter.  Order forwarded to provider for signature.

## 2018-11-27 ENCOUNTER — Telehealth (INDEPENDENT_AMBULATORY_CARE_PROVIDER_SITE_OTHER): Payer: Self-pay | Admitting: Family Medicine

## 2018-11-27 NOTE — Telephone Encounter (Signed)
Form dropped off from patient for disability parking placard. Patient seen on 10/04/18 and no mention of disability placard. Patient would need seen for evaluation.

## 2018-11-27 NOTE — Telephone Encounter (Signed)
I left detailed message for pt to schedule appt for form

## 2018-11-28 ENCOUNTER — Ambulatory Visit (INDEPENDENT_AMBULATORY_CARE_PROVIDER_SITE_OTHER): Payer: Medicare Other | Admitting: Family Medicine

## 2018-11-28 ENCOUNTER — Encounter (INDEPENDENT_AMBULATORY_CARE_PROVIDER_SITE_OTHER): Payer: Self-pay | Admitting: Family Medicine

## 2018-11-28 ENCOUNTER — Telehealth (INDEPENDENT_AMBULATORY_CARE_PROVIDER_SITE_OTHER): Payer: Self-pay | Admitting: Family Medicine

## 2018-11-28 ENCOUNTER — Other Ambulatory Visit: Payer: Self-pay

## 2018-11-28 VITALS — BP 120/80 | HR 63 | Temp 97.3°F | Ht 69.0 in | Wt 240.0 lb

## 2018-11-28 DIAGNOSIS — G8929 Other chronic pain: Secondary | ICD-10-CM

## 2018-11-28 DIAGNOSIS — M170 Bilateral primary osteoarthritis of knee: Secondary | ICD-10-CM

## 2018-11-28 DIAGNOSIS — K2960 Other gastritis without bleeding: Secondary | ICD-10-CM

## 2018-11-28 DIAGNOSIS — E785 Hyperlipidemia, unspecified: Secondary | ICD-10-CM

## 2018-11-28 DIAGNOSIS — I10 Essential (primary) hypertension: Secondary | ICD-10-CM

## 2018-11-28 DIAGNOSIS — Z125 Encounter for screening for malignant neoplasm of prostate: Secondary | ICD-10-CM

## 2018-11-28 DIAGNOSIS — K760 Fatty (change of) liver, not elsewhere classified: Secondary | ICD-10-CM

## 2018-11-28 DIAGNOSIS — M7918 Myalgia, other site: Secondary | ICD-10-CM

## 2018-11-28 DIAGNOSIS — E559 Vitamin D deficiency, unspecified: Secondary | ICD-10-CM

## 2018-11-28 DIAGNOSIS — H538 Other visual disturbances: Secondary | ICD-10-CM

## 2018-11-28 DIAGNOSIS — J4540 Moderate persistent asthma, uncomplicated: Secondary | ICD-10-CM

## 2018-11-28 DIAGNOSIS — F5101 Primary insomnia: Secondary | ICD-10-CM

## 2018-11-28 DIAGNOSIS — K5903 Drug induced constipation: Secondary | ICD-10-CM

## 2018-11-28 MED ORDER — IBUPROFEN 800 MG TABLET
800.00 mg | ORAL_TABLET | Freq: Three times a day (TID) | ORAL | 1 refills | Status: DC | PRN
Start: 2018-11-28 — End: 2019-06-05

## 2018-11-28 NOTE — Nursing Note (Signed)
Disability Forms

## 2018-11-28 NOTE — Progress Notes (Signed)
Chief Complaint   Patient presents with   . Form Needs Completed   . Follow-up     SUBJECTIVE:  Steve Young is a 58 y.o. male who presents for follow-up.  He states he needs a parking disability placard because he has difficulty walking due to arthritis.  He has known history of having chronic musculoskeletal pain.  He is still having problems with the pain of both knees due to osteoarthritis.  He goes to pain clinic.  He has no substance abuse.  His weight is about the same as last visit. The patient's hypertension is stable on current therapy. He is feeling well and denies any symptoms referable to his hypertension. He is adherent to low salt diet.  He has hyperlipidemia and has no problems on current therapy. Compliance with treatment thus far has been good.  A fasting lipid profile was not done this year. The patient does a lot of housework like mowing grass.  He would like to get a refill of ibuprofen because it helps with the pain as needed.  Insomnia is stable.  Reflux gastritis is better with medication.  He has hepatic steatosis and has seen gastroenterologist.  Constipation is better with medication.  Has vitamin-D deficiency and has no problems taking his vitamin-D supplement.  Asthma is controlled.  He has no recent breathing problems.  He still has to get blood work ordered last time including PSA for prostate cancer screening.  He states he has blurred vision and has problems with watching TV and reading.  He has eyeglasses and would like to see the eye doctor. The patient is not known to have coronary artery disease. He denies the following symptoms today: cough, severe headache, epistaxis, chest discomfort, TIA or stroke symptoms, shortness of breath, dyspnea on exertion or palpitations. Side effects of medication: no current medication side effects.     Current Outpatient Medications   Medication Sig   . albuterol sulfate (PROVENTIL OR VENTOLIN OR PROAIR) 90 mcg/actuation Inhalation HFA  Aerosol Inhaler inhale 1 puff by mouth every 4 hours if needed wheezing   . amLODIPine (NORVASC) 10 mg Oral Tablet take 1 tablet by mouth once daily   . aspirin (ECOTRIN) 81 mg Oral Tablet, Delayed Release (E.C.) take 1 tablet by mouth once daily   . atorvastatin (LIPITOR) 80 mg Oral Tablet take 1 tablet by mouth at bedtime   . budesonide-formoteroL (SYMBICORT) 160-4.5 mcg/actuation Inhalation HFA Aerosol Inhaler Take 2 Puffs by inhalation Twice daily   . chlorthalidone (HYGROTON) 25 mg Oral Tablet take 1 tablet by mouth once daily   . cholecalciferol, vitamin D3, 125 mcg (5,000 unit) Oral Capsule take 1 capsule by mouth once daily   . gabapentin (NEURONTIN) 600 mg Oral Tablet take 1 tablet by mouth three times a day   . losartan (COZAAR) 100 mg Oral Tablet take 1 tablet by mouth once daily   . lubiprostone (AMITIZA) 24 mcg Oral Capsule Take 1 Cap (24 mcg total) by mouth Twice daily   . magnesium oxide (MAG-OX) 400 mg (241.3 mg magnesium) Oral Tablet Take 400 mg by mouth   . methocarbamol (ROBAXIN) 500 mg Oral Tablet    . NARCAN 4 mg/actuation Nasal Spray, Non-Aerosol INSTILL 1 SPRAY NASALLY AS NEEDED PER CDC RECOMMENDATIONS   . omega-3 fatty acid (LOVAZA) 1 gram Oral Capsule take 2 capsules by mouth twice a day   . omeprazole (PRILOSEC) 40 mg Oral Capsule, Delayed Release(E.C.) take 1 capsule by mouth once daily   .  oxyCODONE-acetaminophen (PERCOCET) 10-325 mg Oral Tablet take 1 tablet by mouth three times a day if needed for pain   . zafirlukast (ACCOLATE) 20 mg Oral Tablet Take 1 Tab (20 mg total) by mouth Twice a day before meals   . zolpidem (AMBIEN) 10 mg Oral Tablet take 1 tablet by mouth at bedtime if needed      No Known Allergies     Past Surgical History:   Procedure Laterality Date   . COLONOSCOPY     . ELBOW SURGERY Left 12/2017   . HX OTHER  2004     SPINAL FUSION,ANT,EA ADNL LEVEL c3-c7     Social History     Tobacco Use   . Smoking status: Never Smoker   . Smokeless tobacco: Never Used   Substance  Use Topics   . Alcohol use: Not Currently   . Drug use: Never     Review of Systems:  Constitutional: No fever, no chills  ENT: No nasal congestion, No sore throat  Respiratory: No hemoptysis, No pleurisy  Cardiovascular: No chest pain, no palpitations  Gastrointestinal: No abdominal pain, no vomiting  Musculoskeletal: No recent trauma, chronic pain  Neurologic: No confusion, no dizziness  Endocrine: No excessive hunger or thirst.  Integumentary : No rash, no pruritus  Psychiatric: No anger, no depression    OBJECTIVE:   The patient appears to be in no acute distress.  Vitals: BP 120/80   Pulse 63   Temp 36.3 C (97.3 F)   Ht 1.753 m (5\' 9" )   Wt 109 kg (240 lb)   SpO2 97%   BMI 35.44 kg/m     General: alert, no acute distress  Head: normocephalic, atraumatic  Hearing: Hears conversational voices  Eye: EOM intact, normal conjunctiva  Nose: No discharge, no epistaxis  Mouth: Moist lips, no cold sores  Abdomen: Bowel sounds present, soft, non-tender  Respiratory: Good diaphragmatic excursion. Lungs clear.  Heart: RRR  Extremities: Ambulatory with antalgic gait, no edema  Neurologic: Awake, alert, oriented, speech clear and coherent  Skin: No jaundice, warm   Psychiatric: Cooperative, pleasant, normal affect     Blood Pressure treatment target: 140/90     ASSESSMENT:     ICD-10-CM    1. Essential hypertension I10 COMPREHENSIVE METABOLIC PNL, FASTING     URIC ACID     MAGNESIUM     CBC     OUTSIDE CONSULT/REFERRAL PROVIDER(AMB) - ophthalmology  Continue heart healthy diet, regular exercise as tolerated, weight loss.   2. Hyperlipidemia LDL goal <130 E78.5 COMPREHENSIVE METABOLIC PNL, FASTING     LIPID PANEL   3. Chronic musculoskeletal pain M79.18 COMPREHENSIVE METABOLIC PNL, FASTING  Continue pain management.    G89.29 CBC     Ibuprofen (MOTRIN) 800 mg Oral Tablet   4. Moderate persistent asthma without complication Y86.57 COMPREHENSIVE METABOLIC PNL, FASTING     CBC   5. Primary insomnia F51.01 COMPREHENSIVE  METABOLIC PNL, FASTING     CBC   6. Reflux gastritis K29.60 COMPREHENSIVE METABOLIC PNL, FASTING     CBC   7. Hepatic steatosis K76.0 COMPREHENSIVE METABOLIC PNL, FASTING     CBC   8. Drug-induced constipation K59.03 COMPREHENSIVE METABOLIC PNL, FASTING     CBC   9. Vitamin D deficiency E55.9 COMPREHENSIVE METABOLIC PNL, FASTING     VITAMIN D     CBC   10. Primary osteoarthritis of both knees M17.0 COMPREHENSIVE METABOLIC PNL, FASTING     URIC ACID  MAGNESIUM     CBC     Ibuprofen (MOTRIN) 800 mg Oral Tablet  Will complete parking disability placard form.  Recommended weight loss.   11. Screening PSA (prostate specific antigen) Z12.5 PSA SCREENING   12. Blurred vision, bilateral H53.8 OUTSIDE CONSULT/REFERRAL PROVIDER(AMB) - Ophthalmology       Orders Placed This Encounter   . COMPREHENSIVE METABOLIC PNL, FASTING   . LIPID PANEL   . URIC ACID   . MAGNESIUM   . VITAMIN D   . PSA SCREENING   . CBC   . OUTSIDE CONSULT/REFERRAL PROVIDER(AMB) - ophthalmology   . Ibuprofen (MOTRIN) 800 mg Oral Tablet     PLAN:  Medication as ordered. Patient agreed with above treatment and plan. Questions answered to patient's satisfaction. Continue other maintenance medications.  Follow-up in 3 months , sooner should new symptoms or problems arise.   He  was advised to contact the office if with any questions or concerns.    Jarome Lamas. Shona Simpson, MD  Note: This chart was transcribed using voice recognition software and may contain unintended word substitution or minor typographical errors.

## 2018-11-28 NOTE — Telephone Encounter (Signed)
Gave patient number to call Dr Clabe Seal and schedule appt      Gave script to patient

## 2018-11-29 NOTE — Telephone Encounter (Signed)
OV 11-28-18  Form completed 11-29-18  Notified patient   Form at gate keepers desk

## 2018-12-01 ENCOUNTER — Other Ambulatory Visit (INDEPENDENT_AMBULATORY_CARE_PROVIDER_SITE_OTHER): Payer: Self-pay | Admitting: Family Medicine

## 2018-12-01 NOTE — Telephone Encounter (Signed)
Last filled 11-13-18 (21 tablets, 0r refills)  LV 11-28-18  NV 03-06-19      I have reviewed the patients controlled substance prescription history from the [place] web site. Physician review of [place] is not required. Pended prescription routed to provider for review and signature.

## 2018-12-02 ENCOUNTER — Other Ambulatory Visit (INDEPENDENT_AMBULATORY_CARE_PROVIDER_SITE_OTHER): Payer: Self-pay | Admitting: Family Medicine

## 2018-12-04 NOTE — Telephone Encounter (Addendum)
Incorrect dose. Declined.

## 2018-12-07 ENCOUNTER — Other Ambulatory Visit (INDEPENDENT_AMBULATORY_CARE_PROVIDER_SITE_OTHER): Payer: Self-pay | Admitting: Family Medicine

## 2018-12-07 DIAGNOSIS — J4541 Moderate persistent asthma with (acute) exacerbation: Secondary | ICD-10-CM

## 2018-12-07 NOTE — Telephone Encounter (Signed)
Patient requesting refill of medication     Last ov 11/28/2018  Next ov 03/06/2019  Last filled: 08/16/18 #60 Rf-3    Medication pended for signature if appropriate.

## 2018-12-19 ENCOUNTER — Other Ambulatory Visit (INDEPENDENT_AMBULATORY_CARE_PROVIDER_SITE_OTHER): Payer: Self-pay | Admitting: Family Medicine

## 2018-12-19 NOTE — Telephone Encounter (Signed)
Last scheduled appointment with you was 11/28/2018.  Currently scheduled future appointment is 03/06/2019.    Patient has been seen within the last year: Yes.    Confirmed preferred pharmacy for this refill encounter is   Preferred Pharmacy     [facility], [place] - [place]    [place] [address]    Phone: [phone] Fax: [phone]    Not a 24 hour pharmacy; exact hours not known.      Thom Chimes, RN  12/19/2018, 10:37

## 2019-01-03 ENCOUNTER — Other Ambulatory Visit (INDEPENDENT_AMBULATORY_CARE_PROVIDER_SITE_OTHER): Payer: Self-pay | Admitting: Family Medicine

## 2019-01-03 NOTE — Telephone Encounter (Signed)
Last scheduled appointment with you was 11/28/2018.  Currently scheduled future appointment is 03/06/2019.    Patient has been seen within the last year: Yes.    Confirmed preferred pharmacy for this refill encounter is   Preferred Pharmacy     [facility], [place] - [place]    [place] [address]    Phone: [phone] Fax: [phone]    Not a 24 hour pharmacy; exact hours not known.      Thom Chimes, RN  01/03/2019, 11:53

## 2019-01-04 ENCOUNTER — Encounter (INDEPENDENT_AMBULATORY_CARE_PROVIDER_SITE_OTHER): Payer: Self-pay | Admitting: Family Medicine

## 2019-01-08 ENCOUNTER — Telehealth (INDEPENDENT_AMBULATORY_CARE_PROVIDER_SITE_OTHER): Payer: Self-pay | Admitting: Family Medicine

## 2019-01-08 NOTE — Telephone Encounter (Signed)
Called and left a message for the patient regarding Medicare Wellness appt

## 2019-01-13 ENCOUNTER — Other Ambulatory Visit (INDEPENDENT_AMBULATORY_CARE_PROVIDER_SITE_OTHER): Payer: Self-pay | Admitting: Family Medicine

## 2019-01-15 ENCOUNTER — Other Ambulatory Visit (INDEPENDENT_AMBULATORY_CARE_PROVIDER_SITE_OTHER): Payer: Self-pay | Admitting: Family Medicine

## 2019-01-15 NOTE — Telephone Encounter (Signed)
Last scheduled appointment with you was 11/28/2018.  Currently scheduled future appointment is 01/15/2019.    Patient has been seen within the last year: Yes.    Confirmed preferred pharmacy for this refill encounter is   Preferred Pharmacy     [facility], [place] [address] [place]    [place] [address]    Phone: [phone] Fax: [phone]    Not a 24 hour pharmacy; exact hours not known.      Thom Chimes, RN  01/15/2019, 14:44

## 2019-01-16 NOTE — Telephone Encounter (Signed)
[place].  He is on chronic opiate therapy for pain and also this controlled medication for sleep.  Is he willing to try this time non controlled medication sleep aid?  Thank you

## 2019-01-16 NOTE — Telephone Encounter (Signed)
Will refill for now, please schedule office visit this month to discuss sleep. Thank you.

## 2019-01-16 NOTE — Telephone Encounter (Signed)
Last scheduled appointment with you was 11/28/2018.  Currently scheduled future appointment is 03/06/2019.    Patient has been seen within the last year: Yes.    Confirmed preferred pharmacy for this refill encounter is   Preferred Pharmacy     [facility], [place] - [place]    [place] [address]    Phone: [phone] Fax: [phone]    Not a 24 hour pharmacy; exact hours not known.      Thom Chimes, RN  01/16/2019, [id number]

## 2019-01-16 NOTE — Telephone Encounter (Signed)
Spoke with patient and advised per Dr Carlisle Cater- he became upset yelling "no no no we tried that before and it didn't work."  Patient reports only Ambien is effective with insomnia.  Please advise.

## 2019-01-17 ENCOUNTER — Other Ambulatory Visit (INDEPENDENT_AMBULATORY_CARE_PROVIDER_SITE_OTHER): Payer: Self-pay | Admitting: Family Medicine

## 2019-01-17 NOTE — Telephone Encounter (Signed)
Pt called back. Let him know already has apt scheduled and med was refilled.

## 2019-01-17 NOTE — Telephone Encounter (Signed)
Last scheduled appointment with you was 11/28/2018.  Currently scheduled future appointment is 03/06/2019.    Patient has been seen within the last year: Yes.    Confirmed preferred pharmacy for this refill encounter is   Preferred Pharmacy     [facility], [place] - [place]    [place] [address]    Phone: [phone] Fax: [phone]    Not a 24 hour pharmacy; exact hours not known.      Kathryne Gin, RN  01/17/2019, 10:18

## 2019-01-17 NOTE — Telephone Encounter (Signed)
Called and left a message asking the patient to call in and make an appt

## 2019-01-31 ENCOUNTER — Other Ambulatory Visit (INDEPENDENT_AMBULATORY_CARE_PROVIDER_SITE_OTHER): Payer: Self-pay | Admitting: Family Medicine

## 2019-01-31 NOTE — Telephone Encounter (Signed)
Last scheduled appointment with you was 11/28/2018.  Currently scheduled future appointment is 03/06/2019.    Patient has been seen within the last year: Yes.    Confirmed preferred pharmacy for this refill encounter is   Preferred Pharmacy     [facility], [place] - [place]    [place] [address]    Phone: [phone] Fax: [phone]    Not a 24 hour pharmacy; exact hours not known.      Kathryne Gin, RN  01/31/2019, 12:29

## 2019-02-03 ENCOUNTER — Other Ambulatory Visit (INDEPENDENT_AMBULATORY_CARE_PROVIDER_SITE_OTHER): Payer: Self-pay | Admitting: Family Medicine

## 2019-02-05 ENCOUNTER — Other Ambulatory Visit (INDEPENDENT_AMBULATORY_CARE_PROVIDER_SITE_OTHER): Payer: Self-pay | Admitting: Family Medicine

## 2019-02-05 NOTE — Telephone Encounter (Signed)
Last scheduled appointment with you was 11/28/2018.  Currently scheduled future appointment is 02/05/2019.    Patient has been seen within the last year: Yes.    Confirmed preferred pharmacy for this refill encounter is   Preferred Pharmacy     [facility], [place] - [place]    [place] [address]    Phone: [phone] Fax: [phone]    Not a 24 hour pharmacy; exact hours not known.      Last filled Ambien 01/16/19  I have reviewed the patient's controlled substance prescription history from the [place] PDMP and my orders/prescriptions reflect my decisions based on the patient's condition, reason for care, my evaluation, and PDMP data.  I queried the PDMP and no physician review of PDMP is required according to criteria. Pended prescription routed to MD for review and signature.          Lupita Leash, RN  02/05/2019, 11:25

## 2019-02-05 NOTE — Telephone Encounter (Signed)
He is not due yet for refill.  He is given 21 tablets per month to help him sleep but he should have days off from taking Ambien.  Please schedule appointment to discuss sleep again.  Thank you

## 2019-02-06 NOTE — Telephone Encounter (Signed)
Last scheduled appointment with you was 11/28/2018.  Currently scheduled future appointment is 03/06/2019.    Patient has been seen within the last year: Yes.    Confirmed preferred pharmacy for this refill encounter is   Preferred Pharmacy     [facility], [place] - [place]    [place] [address]    Phone: [phone] Fax: [phone]    Not a 24 hour pharmacy; exact hours not known.      Kathryne Gin, RN  02/06/2019, 09:27

## 2019-02-06 NOTE — Telephone Encounter (Signed)
Advised patient too soon for fill. He verbalized understanding.

## 2019-02-06 NOTE — Telephone Encounter (Signed)
Called patient to advise too early for refill and of how to use PRN medications.  No answer, LMOM for patient to call office back.

## 2019-02-11 ENCOUNTER — Other Ambulatory Visit (INDEPENDENT_AMBULATORY_CARE_PROVIDER_SITE_OTHER): Payer: Self-pay | Admitting: Family Medicine

## 2019-02-14 ENCOUNTER — Other Ambulatory Visit (INDEPENDENT_AMBULATORY_CARE_PROVIDER_SITE_OTHER): Payer: Self-pay | Admitting: Family Medicine

## 2019-02-14 NOTE — Telephone Encounter (Signed)
Last scheduled appointment with you was 11/28/2018.  Currently scheduled future appointment is 03/06/2019.    Patient has been seen within the last year: Yes.      I have reviewed the patient's controlled substance prescription history [place] and my orders/prescriptions reflect my decisions based on the patient's condition, reason for care, my evaluation, and PDMP data.  I queried the PDMP and no physician review of PDMP is required according to criteria. Pended prescription routed to the provider for review and signature.       01/17/2019 1 01/16/2019   ZOLPIDEM TARTRATE 10 MG TABLET  21.0 21 RA TAL [id number]    Confirmed preferred pharmacy for this refill encounter is   Preferred Pharmacy     [facility], [place]    [place] [address]    Phone: [phone] Fax: [phone]    Not a 24 hour pharmacy; exact hours not known.      Christianne Dolin, RN  02/14/2019, 15:39

## 2019-02-14 NOTE — Telephone Encounter (Signed)
Please review [place]. Thank you.

## 2019-02-14 NOTE — Telephone Encounter (Signed)
Last scheduled appointment with you was 11/28/2018.  Currently scheduled future appointment is 03/06/2019.    Patient has been seen within the last year: Yes.    Confirmed preferred pharmacy for this refill encounter is   Preferred Pharmacy     [facility], [place] - [place]    [place] [address]    Phone: [phone] Fax: [phone]    Not a 24 hour pharmacy; exact hours not known.      Kathryne Gin, RN  02/14/2019, 13:09

## 2019-02-15 NOTE — Telephone Encounter (Signed)
I have reviewed the patient's controlled substance prescription history [place] and my orders/prescriptions reflect my decisions based on the patient's condition, reason for care, my evaluation, and PDMP data.  I queried the PDMP and no physician review of PDMP is required according to criteria. Pended prescription routed to the provider for review and signature.

## 2019-02-27 ENCOUNTER — Telehealth (INDEPENDENT_AMBULATORY_CARE_PROVIDER_SITE_OTHER): Payer: Self-pay | Admitting: Family Medicine

## 2019-02-27 MED ORDER — SILDENAFIL 25 MG TABLET
25.00 mg | ORAL_TABLET | ORAL | 1 refills | Status: DC | PRN
Start: 2019-02-27 — End: 2019-03-06

## 2019-02-27 NOTE — Telephone Encounter (Signed)
I sent medication.  Sometimes insurance does not pay for the medication so if affordable may pay.  Will start with 25 mg if not effective will try up to 100 mg of Viagra.  Thank you

## 2019-02-27 NOTE — Telephone Encounter (Signed)
Pt was here with another pt and said he asked about getting medication for ED.  He said that you advised him to come and talk to [place].  He said it was the blue pill.  Can you advise if he may need an appt or if this is something you can order.  Thank you.

## 2019-02-28 NOTE — Telephone Encounter (Signed)
Spoke with patient informed him per Dr Carlisle Cater sent medication.  Sometimes insurance does not pay for the medication so if affordable may pay.  Will start with 25 mg if not effective will try up to 100 mg of Viagra.    He verbalized understanding and denied questions

## 2019-03-02 ENCOUNTER — Other Ambulatory Visit (INDEPENDENT_AMBULATORY_CARE_PROVIDER_SITE_OTHER): Payer: Self-pay | Admitting: Family Medicine

## 2019-03-02 DIAGNOSIS — J4541 Moderate persistent asthma with (acute) exacerbation: Secondary | ICD-10-CM

## 2019-03-02 NOTE — Telephone Encounter (Signed)
Last Visit:11/28/2018     Upcoming appointments: 03/06/2019           Christianne Dolin, RN  03/02/2019, 14:54

## 2019-03-03 ENCOUNTER — Other Ambulatory Visit (INDEPENDENT_AMBULATORY_CARE_PROVIDER_SITE_OTHER): Payer: Self-pay | Admitting: Family Medicine

## 2019-03-05 LAB — COMPREHENSIVE METABOLIC PNL, FASTING
ALBUMIN: 3.8
ALKALINE PHOSPHATASE: 50
ALT (SGPT): 17
ANION GAP: 5
AST (SGOT): 13
BILIRUBIN, TOTAL: 1.1
BUN: 15
CALCIUM: 9.3
CARBON DIOXIDE: 32
CHLORIDE: 103
CREATININE: 1.1
GLUCOSE, FASTING: 114
POTASSIUM: 3.4
SODIUM: 140
TOTAL PROTEIN: 6.1

## 2019-03-05 LAB — VITAMIN D: VITAMIN D: 58 ng/mL

## 2019-03-05 LAB — CBC
HCT: 40
HGB: 13.6
MCH: 29.7
MCHC: 34.1
MCV: 87.2
MPV: 9.9
PLATELET COUNT: 192
RBC: 4.59
RDW: 14.6
WBC: 8.8

## 2019-03-05 LAB — PSA SCREENING: PROSTATE SPECIFIC AG: 1.5

## 2019-03-05 LAB — MAGNESIUM: MAGNESIUM: 1.8

## 2019-03-05 LAB — LIPID PANEL
CHOLESTEROL: 119
HDL-CHOLESTEROL: 33
LDL (CALCULATED): 55
TRIGLYCERIDES: 155

## 2019-03-05 LAB — URIC ACID: URIC ACID: 4.8

## 2019-03-05 NOTE — Telephone Encounter (Signed)
Last Visit:11/28/2018     Upcoming appointments: 03/06/2019           Kathryne Gin, RN  03/05/2019, 16:05    I have reviewed the patient's controlled substance [place] and my orders/prescriptions reflect my decisions based on the patient's condition, reason for care, my evaluation, and PDMP data.  I queried the PDMP and no physician review of PDMP is required according to criteria. Pended prescription routed to the provider for review and signature.

## 2019-03-06 ENCOUNTER — Other Ambulatory Visit (INDEPENDENT_AMBULATORY_CARE_PROVIDER_SITE_OTHER): Payer: Self-pay

## 2019-03-06 ENCOUNTER — Ambulatory Visit (INDEPENDENT_AMBULATORY_CARE_PROVIDER_SITE_OTHER): Payer: Medicare Other | Admitting: Family Medicine

## 2019-03-06 ENCOUNTER — Other Ambulatory Visit: Payer: Self-pay

## 2019-03-06 ENCOUNTER — Encounter (INDEPENDENT_AMBULATORY_CARE_PROVIDER_SITE_OTHER): Payer: Self-pay | Admitting: Family Medicine

## 2019-03-06 VITALS — BP 130/84 | HR 68 | Temp 97.5°F | Ht 69.0 in | Wt 232.0 lb

## 2019-03-06 DIAGNOSIS — R37 Sexual dysfunction, unspecified: Secondary | ICD-10-CM

## 2019-03-06 DIAGNOSIS — E785 Hyperlipidemia, unspecified: Secondary | ICD-10-CM

## 2019-03-06 DIAGNOSIS — J4540 Moderate persistent asthma, uncomplicated: Secondary | ICD-10-CM

## 2019-03-06 DIAGNOSIS — M7918 Myalgia, other site: Secondary | ICD-10-CM

## 2019-03-06 DIAGNOSIS — K2960 Other gastritis without bleeding: Secondary | ICD-10-CM

## 2019-03-06 DIAGNOSIS — K5903 Drug induced constipation: Secondary | ICD-10-CM

## 2019-03-06 DIAGNOSIS — I10 Essential (primary) hypertension: Secondary | ICD-10-CM

## 2019-03-06 DIAGNOSIS — K760 Fatty (change of) liver, not elsewhere classified: Secondary | ICD-10-CM

## 2019-03-06 DIAGNOSIS — G8929 Other chronic pain: Secondary | ICD-10-CM

## 2019-03-06 DIAGNOSIS — R7303 Prediabetes: Secondary | ICD-10-CM

## 2019-03-06 DIAGNOSIS — F4310 Post-traumatic stress disorder, unspecified: Secondary | ICD-10-CM

## 2019-03-06 DIAGNOSIS — F5101 Primary insomnia: Secondary | ICD-10-CM

## 2019-03-06 DIAGNOSIS — E559 Vitamin D deficiency, unspecified: Secondary | ICD-10-CM

## 2019-03-06 DIAGNOSIS — M170 Bilateral primary osteoarthritis of knee: Secondary | ICD-10-CM

## 2019-03-06 DIAGNOSIS — F5104 Psychophysiologic insomnia: Secondary | ICD-10-CM | POA: Insufficient documentation

## 2019-03-06 MED ORDER — FOLIC ACID 1 MG TABLET
1.0000 mg | ORAL_TABLET | Freq: Every day | ORAL | 3 refills | Status: DC
Start: 2019-03-06 — End: 2020-01-30

## 2019-03-06 MED ORDER — SILDENAFIL (PULMONARY HYPERTENSION) 20 MG TABLET
ORAL_TABLET | ORAL | 3 refills | Status: DC
Start: 2019-03-06 — End: 2019-05-08

## 2019-03-06 MED ORDER — PAROXETINE 10 MG TABLET
10.00 mg | ORAL_TABLET | Freq: Every morning | ORAL | 3 refills | Status: DC
Start: 2019-03-06 — End: 2019-05-07

## 2019-03-06 NOTE — Nursing Note (Signed)
Pt is here for a f/u. Pt states the bottoms of his feet her and feels like tingling. Pt states this has been happening for a awhile.

## 2019-03-06 NOTE — Progress Notes (Signed)
Chief Complaint   Patient presents with   . Follow-up       SUBJECTIVE:  Steve Young is a 58 y.o. male who presents for follow-up.  He states the bottoms of his feet are hurting but worse in the right foot.  He has intermittent tingling sensation and is concerned about having diabetes.  He has lost weight because he has been trying to lose weight.  His recent fasting glucose was 114. The patient's hypertension is stable on current therapy. He is feeling well and denies any symptoms referable to his hypertension. He has hyperlipidemia and has no problems on current therapy. Compliance with treatment thus far has been good.  A fasting lipid profile was done recently and  His LDL was 55 with HDL of 33, cholesterol of 119 and triglycerides of 115. The patient is known to have chronic musculoskeletal pain and goes to pain clinic.  He denies substance abuse.  He has vitamin-D deficiency and has no problems taking his vitamin-D supplement.  Asthma is controlled.  He has chronic insomnia and sleeps well with Ambien.  He states he always has to take Ambien every night because he is suffering from posttraumatic stress syndrome.  He states in 1987 he injured his [place] forearm [place] and in 2002 he injured his neck while working in [place].  He states he used to work in the [employer] and fell 30 ft from the ladder.  He has sexual dysfunction and gets relief from taking Viagra but the cost is too expensive.  He would like to have an alternative medication for erectile dysfunction. He does not have the following symptoms today: cough, headache, epistaxis, chest discomfort, TIA or stroke symptoms, shortness of breath, dyspnea on exertion or palpitations. Side effects of medications: no current medication side effects.     Current Outpatient Medications   Medication Sig   . albuterol sulfate (PROVENTIL OR VENTOLIN OR PROAIR) 90 mcg/actuation Inhalation HFA Aerosol Inhaler inhale 1 puff by mouth  every 4 hours if needed wheezing   . amLODIPine (NORVASC) 10 mg Oral Tablet take 1 tablet by mouth once daily   . aspirin (ECOTRIN) 81 mg Oral Tablet, Delayed Release (E.C.) take 1 tablet by mouth once daily   . atorvastatin (LIPITOR) 80 mg Oral Tablet take 1 tablet by mouth at bedtime   . chlorthalidone (HYGROTON) 25 mg Oral Tablet take 1 tablet by mouth once daily   . cholecalciferol, vitamin D3, 125 mcg (5,000 unit) Oral Capsule take 1 capsule by mouth once daily   . gabapentin (NEURONTIN) 600 mg Oral Tablet take 1 tablet by mouth three times a day   . Ibuprofen (MOTRIN) 800 mg Oral Tablet Take 1 Tab (800 mg total) by mouth Three times a day as needed for Pain (with food)   . losartan (COZAAR) 100 mg Oral Tablet take 1 tablet by mouth once daily   . lubiprostone (AMITIZA) 24 mcg Oral Capsule Take 1 Cap (24 mcg total) by mouth Twice daily   . magnesium oxide (MAG-OX) 400 mg (241.3 mg magnesium) Oral Tablet Take 400 mg by mouth   . methocarbamol (ROBAXIN) 500 mg Oral Tablet    . NARCAN 4 mg/actuation Nasal Spray, Non-Aerosol INSTILL 1 SPRAY NASALLY AS NEEDED PER CDC RECOMMENDATIONS   . omega-3 fatty acid (LOVAZA) 1 gram Oral Capsule take 2 capsules by mouth twice a day   . omeprazole (PRILOSEC) 40 mg Oral Capsule, Delayed Release(E.C.) take 1 capsule by mouth once  daily   . oxyCODONE-acetaminophen (PERCOCET) 10-325 mg Oral Tablet take 1 tablet by mouth three times a day if needed for pain   . Sildenafil (VIAGRA) 25 mg Oral Tablet Take 1 Tab (25 mg total) by mouth Every 24 hours as needed   . SYMBICORT 160-4.5 mcg/actuation Inhalation HFA Aerosol Inhaler inhale 2 puffs by mouth twice a day   . zafirlukast (ACCOLATE) 20 mg Oral Tablet take 1 tablet by mouth twice a day before meals   . zolpidem (AMBIEN) 10 mg Oral Tablet take 1 tablet by mouth at bedtime if needed      No Known Allergies    Past Surgical History:   Procedure Laterality Date   . COLONOSCOPY     . ELBOW SURGERY Left 12/2017   . HX OTHER  2004      SPINAL FUSION,ANT,EA ADNL LEVEL c3-c7       Social History     Tobacco Use   . Smoking status: Never Smoker   . Smokeless tobacco: Never Used   Substance Use Topics   . Alcohol use: Not Currently   . Drug use: Never       Review of Systems:  Constitutional: No fever, no chills  ENT: No nasal congestion, No sore throat  Respiratory: No wheezing, No pleurisy  Cardiovascular: No chest pain, no palpitations  Gastrointestinal: No abdominal pain, no vomiting  Musculoskeletal: No recent fall, chronic pain  Neurologic: No confusion, no dizziness  Endocrine: No excessive hunger or thirst.  Integumentary : No rash, no pruritus  Psychiatric: No anger, chronic insomnia    OBJECTIVE:   The patient appears to be in no acute distress.  Vitals: BP 130/84 (Site: Left, Patient Position: Sitting, Cuff Size: Adult Large)   Pulse 68   Temp 36.4 C (97.5 F) (Thermal Scan)   Ht 1.753 m (5\' 9" )   Wt 105 kg (232 lb)   SpO2 96%   BMI 34.26 kg/m     General: alert, no acute distress  Head: normocephalic, atraumatic  Hearing: Hears conversational voices  Eye: EOM intact, normal conjunctiva  Nose: No discharge, no epistaxis  Mouth: Moist lips, no cold sores  Abdomen: Bowel sounds present, soft, non-tender  Respiratory: Good diaphragmatic excursion. Lungs clear.  Heart: RRR  Extremities: Ambulatory with normal gait, no edema, bilateral foot deformity, right foot with plantar tenderness  Neurologic: Awake, alert, oriented, speech clear and coherent  Skin: No jaundice, warm   Psychiatric: Cooperative, normal judgment    Blood Pressure treatment target below: 140/90     ASSESSMENT AND PLAN:     ICD-10-CM    1. Essential hypertension  I10 TRIGLYCERIDE     H & H   2. Hyperlipidemia LDL goal <130  E78.5 TRIGLYCERIDE   3. Chronic musculoskeletal pain  M79.18 Foot, Right Xray    G89.29 Continue pain management.   4. Moderate persistent asthma without complication  123456 H & H  Continue inhalers.   5. Vitamin D deficiency  E55.9 Continue  vitamin-D supplement.   6. Chronic insomnia  F51.04 H & H   7. Prediabetes  R73.03 HGA1C (HEMOGLOBIN A1C WITH EST AVG GLUCOSE)  Follow a healthy ADA diet, continue regular exercise as tolerated, weight loss.     TRIGLYCERIDE     H & H   8. Sexual dysfunction  R37 sildenafiL, pulm.hypertension, (REVATIO) 20 mg Tablet - new  Will change Viagra to Revatio due to cost.   9. PTSD (post-traumatic stress disorder)  0000000 folic  acid (FOLVITE) 1 mg Oral Tablet - new     PARoxetine (PAXIL) 10 mg Oral Tablet - new       Orders Placed This Encounter   . Foot, Right Xray   . HGA1C (HEMOGLOBIN A1C WITH EST AVG GLUCOSE)   . TRIGLYCERIDE   . H & H   . sildenafiL, pulm.hypertension, (REVATIO) 20 mg Tablet   . folic acid (FOLVITE) 1 mg Oral Tablet   . PARoxetine (PAXIL) 10 mg Oral Tablet       PLAN:  Medications as ordered. Most common medication side effects of new medications discussed and patient verbalized understanding. Patient agreed with above treatment and plan. Questions answered to patient's satisfaction. Risk, benefits, and alternatives to above treatment discussed with the patient. Continue other maintenance medications. Recheck blood work prior to next visit.  Follow-up in 2 months, sooner should new symptoms or problems arise.   Stop new medications and call office if with adverse reactions.  He was advised to contact the office if with any questions or concerns.    Jarome Lamas. Shona Simpson, MD  Note: This chart was transcribed using voice recognition software and may contain unintended word substitution or minor typographical errors.

## 2019-03-06 NOTE — Patient Instructions (Signed)
Treating Insomnia     Learning to relax before bedtime can improve your sleep.   Good sleeping habits are a key part of treatment. If needed, some medicines may help you sleep better at first. But, making healthy lifestyle changes and learning to relax can improve your sleep long-term. Treating insomnia takes commitment. But trust that your efforts will pay off. Be sure to talk with your healthcare provider before taking any medicine.   Healthy lifestyle  Your lifestyle affects your health and your sleep. Here are some healthy habits:   Keep a regular sleep schedule. Go to bed and get up at the same time each day.   Exercise regularly. It may help you reduce stress. Don't do strenuous exercise for 2 to 4 hours before bedtime.   Avoid or limit naps, especially in the late afternoon.   Use your bed only for sleep and sex.   Don't spend too much time in bed trying to fall asleep. If you can't fall asleep, get up and do something (no electronics)until you become tired and drowsy.   Avoid or limit caffeine and nicotinefor up to 6 hours before bedtime. They can keep you awake at night.   Also avoid alcoholfor at least 4 to 6 hoursbefore bedtime. It may help you fall asleep at first. Butyou will have more awakenings during the night. Andyour sleep will not be restful.  Before bedtime  To sleep better every night, try these tips:   Have a bedtime routine to let your body and mind know when it's time to sleep.   Think of going to bed as relaxing and enjoyable. Sleep will come sooner.   If your worries don't let you sleep, write them down in a diary. Then close it, and go to bed.   Make sure the room is not too hot or too cold. If it's not dark enough, an eye mask can help. If it's noisy, try using earplugs.   Don't eat alarge meal just before bedtime. If you are hungry, eat a light, healthy snack.   Remove noises, bright lights, TVs, cell phones, and computers from your sleeping environment.   Use a  comfortable mattress and pillow.  Learn to relax  Stress, anxiety, and body tension may keep you awake at night. To unwind before bedtime, try a warm bath, meditation, or yoga. Also try the following:    Deep breathing. Sit or lie back in a chair. Take a slow, deep breath. Hold it for 5 counts. Then breathe out slowly through your mouth. Keep doing this until you feel relaxed.   Progressive muscle relaxation. Tense and then relax the muscles in your body as you breathe deeply. Start with your feet and work up your body to your neck and face.  [employer]Well last reviewed this educational content on 01/15/2018   2000-2020 [employer]. [address] [place] [address]. All rights reserved. This information is not intended as a substitute for professional medical care. Always follow your healthcare professional's instructions.        Insomnia  Insomnia is repeated difficulty going to sleep or staying asleep, or both. Whether you have insomnia is not defined by a specific amount of sleep. Different people need different amounts of sleep, and you may need more or less sleep at different times of your life.  There are3 major types of insomnia: short-term, chronic, and "other." Short-term, or acute insomnia lasts less than3 months. The symptoms are temporary and can  be linked directly to a stressor, such as the death of a loved one, financial problems, or a new physical problem. Short-term insomnia stops when the stressor resolves or the person adapts to its presence.  Chronic insomnia occurs at least3 times a week and lasts longer than 3 months. Chronic insomnia can occur when either the cause of the sleeping problem is not clear, or the insomnia does not get better when the stressor is resolved. A number of other criteria are also used to make the diagnosis of chronic insomnia.  "Other insomnia" is the third type of insomnia-related sleep disorders. This description applies to people who have  problems getting to sleep or staying asleep, but don't meet all of the factors that describe either short-term or chronic insomnia.  Many things cause insomnia. Different people may have different causes. It can be from an underlying medical or psychological condition, or lifestyle. It can also be primary insomnia, which means no cause can be found.  Causes of insomnia include:   Chronic medical problems- heart disease, gastrointestinal problems, hormonal changes, breathing problems   Anxiety   Stress   Depression   Pain   Work schedule   Sleep apnea   Illegal drugs   Certain medicines  Many different medicines can affect your sleep, such as stimulants, caffeine, alcohol, some decongestants, and diet pills. Other medicines may include some types of blood pressure pills, steroids, asthma medicines, antihistamines, antidepressants, seizure medicines and statins. Not all of these will affect your sleep,and they shouldn't be stopped without talking to your doctor.  Symptoms of insomnia can include:   Lying awake for long periods at night before falling asleep   Waking up several times during the night   Waking up early in the morning and not being able to get back to sleep   Feeling tired and not refreshed by sleep   Not being able to function properly during the day and finding it hard to concentrate   Irritability   Tiredness and fatigue during the day  Home care   Review your medicines with your doctor or pharmacist to find out if they can cause insomnia. Not all medicines will affect your sleep,but they shouldn't be stopped without reviewing them with your doctor. There may be serious side effects and consequences from suddenly stopping your medicines. Not taking them may cause strokes, heart attacks, and many other problems.   Caffeine, smoking, and alcohol also affect sleep. Limit your daily use and don't use these before bedtime. Alcohol may make you sleepy at first, but as its effects wear  off, you may awaken a few hours later and have trouble returning to sleep.   Don't exercise, eat or drink large amounts of liquid within 2 hours of your bedtime.   Improve your sleep habits. Have a fixed bed and wake-up time. Try to keep noise, light and heat in your bedroom at a comfortable level. Try using earplugs or eyeshades if needed.   Don't watch TV in bed.   If you don't fall asleep within 30 minutes, try to relax by reading or listening to soft music.   Limit daytime napping to one 30 minute period, early in the day.   Get regular exercise. Find other ways to lessen your stress level.   If a medicine was prescribed to help reset your sleep patterns, take it as directed. Sleeping pills are intended for short-term use, only. If taken for too long, the effect wears off while the risk  of physical addiction and psychological dependence increases.  Sleep diary  If the cause isn't obvious and it is not improving, try keeping a "sleep diary" for a couple of weeks. Include in it:   The time you go to bed   How long it takes to fall asleep   How many times you wake up   What time you wake up   Your meal times and what you eat   What time you drink alcohol and caffeine   Your exercise habits and times  Follow-up care  Follow up with your healthcare provider, or as advised. If an X-ray or CT scan was done, you will be notified if there is a change in the reading, especially if it affects treatment.  Call 911  Call 911 if any of these occur:   Trouble breathing   Confusion or trouble waking   Fainting or loss of consciousness   Rapid heart rate   New chest, arm, shoulder, neck or upper back pain   Trouble with speech or vision, weakness of an arm or leg   Trouble walking or talking, loss of balance, numbness or weakness in one side of your body, facial droop  When to seek medical advice  Call your healthcare provider right away if any of these occur:   Extreme restlessness or  irritability   Confusion or hallucinations (seeing or hearing things that are not there)   Anxiety, depression   Several days without sleeping  StayWell last reviewed this educational content on 09/14/2016   2000-2020 [employer]. [address], [place] [address]. All rights reserved. This information is not intended as a substitute for professional medical care. Always follow your healthcare professional's instructions.

## 2019-03-08 ENCOUNTER — Other Ambulatory Visit (INDEPENDENT_AMBULATORY_CARE_PROVIDER_SITE_OTHER): Payer: Self-pay

## 2019-03-08 DIAGNOSIS — Z125 Encounter for screening for malignant neoplasm of prostate: Secondary | ICD-10-CM

## 2019-03-12 ENCOUNTER — Encounter (INDEPENDENT_AMBULATORY_CARE_PROVIDER_SITE_OTHER): Payer: Self-pay | Admitting: Family Medicine

## 2019-03-13 ENCOUNTER — Other Ambulatory Visit (INDEPENDENT_AMBULATORY_CARE_PROVIDER_SITE_OTHER): Payer: Self-pay

## 2019-03-13 DIAGNOSIS — E559 Vitamin D deficiency, unspecified: Secondary | ICD-10-CM

## 2019-03-15 ENCOUNTER — Other Ambulatory Visit (INDEPENDENT_AMBULATORY_CARE_PROVIDER_SITE_OTHER): Payer: Self-pay | Admitting: Family Medicine

## 2019-03-15 NOTE — Telephone Encounter (Signed)
Last Visit:03/06/2019     Upcoming appointments: 05/07/2019           Kathryne Gin, RN  03/15/2019, 09:43

## 2019-03-19 ENCOUNTER — Other Ambulatory Visit (INDEPENDENT_AMBULATORY_CARE_PROVIDER_SITE_OTHER): Payer: Self-pay | Admitting: Family Medicine

## 2019-03-20 NOTE — Telephone Encounter (Signed)
Last Visit:03/06/2019     Upcoming appointments: 05/07/2019           Erskine Speed, RN  03/20/2019, 13:48

## 2019-03-23 ENCOUNTER — Other Ambulatory Visit (INDEPENDENT_AMBULATORY_CARE_PROVIDER_SITE_OTHER): Payer: Self-pay | Admitting: Family Medicine

## 2019-03-23 NOTE — Telephone Encounter (Signed)
Last Visit:03/06/2019     Upcoming appointments: 05/07/2019           Erskine Speed, RN  03/23/2019, 13:28

## 2019-03-25 ENCOUNTER — Other Ambulatory Visit (INDEPENDENT_AMBULATORY_CARE_PROVIDER_SITE_OTHER): Payer: Self-pay | Admitting: Family Medicine

## 2019-03-25 DIAGNOSIS — J4541 Moderate persistent asthma with (acute) exacerbation: Secondary | ICD-10-CM

## 2019-03-26 NOTE — Telephone Encounter (Signed)
Last Visit:03/06/2019     Upcoming appointments: 05/07/2019           Erskine Speed, RN  03/26/2019, 10:59

## 2019-04-01 ENCOUNTER — Other Ambulatory Visit (INDEPENDENT_AMBULATORY_CARE_PROVIDER_SITE_OTHER): Payer: Self-pay | Admitting: Family Medicine

## 2019-04-02 NOTE — Telephone Encounter (Signed)
Last Visit:03/06/2019     Upcoming appointments: 05/07/2019           Erskine Speed, RN  04/02/2019, 10:52

## 2019-04-09 ENCOUNTER — Other Ambulatory Visit (INDEPENDENT_AMBULATORY_CARE_PROVIDER_SITE_OTHER): Payer: Self-pay | Admitting: Family Medicine

## 2019-04-09 NOTE — Telephone Encounter (Signed)
Last Visit:03/06/2019     Upcoming appointments: 05/07/2019           Erskine Speed, RN  04/09/2019, 11:35

## 2019-04-12 ENCOUNTER — Other Ambulatory Visit (INDEPENDENT_AMBULATORY_CARE_PROVIDER_SITE_OTHER): Payer: Self-pay | Admitting: Family Medicine

## 2019-04-16 NOTE — Telephone Encounter (Signed)
Last Visit:03/06/2019     Upcoming appointments: 05/07/2019           Erskine Speed, RN  04/16/2019, 09:08

## 2019-04-16 NOTE — Telephone Encounter (Signed)
[place]MP reviewed

## 2019-04-25 ENCOUNTER — Other Ambulatory Visit (INDEPENDENT_AMBULATORY_CARE_PROVIDER_SITE_OTHER): Payer: Self-pay | Admitting: Family Medicine

## 2019-04-25 NOTE — Telephone Encounter (Signed)
Last Visit:03/06/2019     Upcoming appointments: 05/07/2019           Erskine Speed, RN  04/25/2019, 13:51

## 2019-05-06 ENCOUNTER — Other Ambulatory Visit (INDEPENDENT_AMBULATORY_CARE_PROVIDER_SITE_OTHER): Payer: Self-pay | Admitting: Family Medicine

## 2019-05-07 ENCOUNTER — Encounter (INDEPENDENT_AMBULATORY_CARE_PROVIDER_SITE_OTHER): Payer: Self-pay | Admitting: Internal Medicine

## 2019-05-07 ENCOUNTER — Encounter (INDEPENDENT_AMBULATORY_CARE_PROVIDER_SITE_OTHER): Payer: Self-pay | Admitting: Family Medicine

## 2019-05-07 ENCOUNTER — Ambulatory Visit (INDEPENDENT_AMBULATORY_CARE_PROVIDER_SITE_OTHER): Payer: Medicare Other | Admitting: Internal Medicine

## 2019-05-07 ENCOUNTER — Other Ambulatory Visit (INDEPENDENT_AMBULATORY_CARE_PROVIDER_SITE_OTHER): Payer: Self-pay | Admitting: Family Medicine

## 2019-05-07 ENCOUNTER — Other Ambulatory Visit: Payer: Self-pay

## 2019-05-07 VITALS — BP 120/80 | HR 70 | Temp 97.1°F | Ht 69.0 in | Wt 220.5 lb

## 2019-05-07 DIAGNOSIS — I10 Essential (primary) hypertension: Secondary | ICD-10-CM

## 2019-05-07 DIAGNOSIS — R079 Chest pain, unspecified: Secondary | ICD-10-CM

## 2019-05-07 DIAGNOSIS — E559 Vitamin D deficiency, unspecified: Secondary | ICD-10-CM

## 2019-05-07 DIAGNOSIS — Z23 Encounter for immunization: Secondary | ICD-10-CM

## 2019-05-07 DIAGNOSIS — R7303 Prediabetes: Secondary | ICD-10-CM

## 2019-05-07 DIAGNOSIS — F5104 Psychophysiologic insomnia: Secondary | ICD-10-CM

## 2019-05-07 DIAGNOSIS — R37 Sexual dysfunction, unspecified: Secondary | ICD-10-CM

## 2019-05-07 DIAGNOSIS — F4310 Post-traumatic stress disorder, unspecified: Secondary | ICD-10-CM

## 2019-05-07 DIAGNOSIS — J4540 Moderate persistent asthma, uncomplicated: Secondary | ICD-10-CM

## 2019-05-07 DIAGNOSIS — E785 Hyperlipidemia, unspecified: Secondary | ICD-10-CM

## 2019-05-07 DIAGNOSIS — G8929 Other chronic pain: Secondary | ICD-10-CM

## 2019-05-07 DIAGNOSIS — M7918 Myalgia, other site: Secondary | ICD-10-CM

## 2019-05-07 NOTE — Telephone Encounter (Signed)
Last Visit:03/06/2019     Upcoming appointments: 05/07/2019           Rocco Serene, PA-C  05/07/2019, 16:49

## 2019-05-07 NOTE — Progress Notes (Signed)
[facility]  [address]  [place] [id number]    Chief Complaint:    Chief Complaint   Patient presents with   . Follow-up     HPI:  Patient presents for follow-up regarding chronic medical problems.  He has hypertension.  Denies any problems with his medication.  Denies any palpitations but he does note presence of chest pain or pressure.  He states that is been somewhat constant.  He had a stress test several years ago that was negative.  He is not sure if it is related to his asthma or not.  It does not seem to radiate.  He has hyperlipidemia treated with Lipitor.  Tolerating medication well without leg pain or muscle cramping.  He is trying to watch his diet.  He had some prior chronic musculoskeletal pain complaints previously.  He had some prior complaints of foot pain.  Dr. Carlisle Cater had ordered an x-ray of his foot but he did not have that completed.  He states that his symptoms improved.  He does have asthma.  He had prior PFTs that were stable.  He uses albuterol on a somewhat regular basis.  He does use at goal late and Symbicort.  No recent ED visit, antibiotic or steroid uses.  He does uses albuterol on a daily basis but some of the doses are because he wants to take the medication to "make sure" things are stable.  He denies any nighttime symptoms.  He does not smoke.  He is on vitamin-D supplement.  Denies any falls or fractures.  Has chronic insomnia.  Uses Ambien for treatment.  Denies drinking a lot of caffeine in the evening hours or per pop consumption.  However, he does take his albuterol pretty regularly at bedtime before he goes to sleep.  He does have PTSD.  He thought maybe is Paxil dose needed increased but then he changed his mind.  He wants to continue Revatio for treatment of sexual dysfunction.  Reports that he has had asthma for about 18 years.  Prior chest x-ray was negative.  He did work in a shipyard in the past.    Past Medical History:      Diagnosis Date   . Arthralgia of hip 03/21/2018   . Arthralgia of right upper arm 03/30/2016   . Asthma    . Cervicalgia 03/21/2018   . Chronic abdominal pain 10/04/2018   . Chronic back pain 03/21/2018   . Chronic chest pain 03/21/2018   . Chronic insomnia    . Constipation in male 09/13/2017   . Esophageal reflux    . 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 Hepatic lesion 10/04/2018   . Hyperlipidemia    . Hypertension    . Impingement syndrome of left shoulder 12/28/2017   . Mild persistent asthma without complication 0000000   . Normal cardiac stress test 03/21/2018   . Obesity    . 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 Partial tear of common extensor tendon of elbow 12/13/2017   . Pre-diabetes    . Rib pain 10/04/2018   . Sexual dysfunction    . Tear of talofibular ligament 05/30/2013    RIGHT   . Vitamin D deficiency      Past Surgical History:   Procedure Laterality Date   . COLONOSCOPY     . ELBOW SURGERY Left 12/2017   . HX OTHER  2004     SPINAL FUSION,ANT,EA ADNL LEVEL c3-c7  Family Medical History:     Family history is unknown by patient.        Social History     Tobacco Use   . Smoking status: Never Smoker   . Smokeless tobacco: Never Used   Substance Use Topics   . Alcohol use: Not Currently   . Drug use: Never     No Known Allergies    Current Outpatient Medications   Medication Sig   . albuterol sulfate (PROVENTIL OR VENTOLIN OR PROAIR) 90 mcg/actuation Inhalation HFA Aerosol Inhaler inhale 1 puff by mouth every 4 hours if needed wheezing   . amLODIPine (NORVASC) 10 mg Oral Tablet take 1 tablet by mouth once daily   . aspirin (ECOTRIN) 81 mg Oral Tablet, Delayed Release (E.C.) take 1 tablet by mouth once daily   . atorvastatin (LIPITOR) 80 mg Oral Tablet take 1 tablet by mouth at bedtime   . chlorthalidone (HYGROTON) 25 mg Oral Tablet take 1 tablet by mouth once daily   . cholecalciferol, vitamin D3, 125 mcg (5,000 unit) Oral Capsule take 1 capsule by mouth once daily   . folic acid  (FOLVITE) 1 mg Oral Tablet Take 1 Tab (1 mg total) by mouth Once a day   . gabapentin (NEURONTIN) 600 mg Oral Tablet take 1 tablet by mouth three times a day   . Ibuprofen (MOTRIN) 800 mg Oral Tablet Take 1 Tab (800 mg total) by mouth Three times a day as needed for Pain (with food)   . losartan (COZAAR) 100 mg Oral Tablet take 1 tablet by mouth once daily   . lubiprostone (AMITIZA) 24 mcg Oral Capsule Take 1 Cap (24 mcg total) by mouth Twice daily   . magnesium oxide (MAG-OX) 400 mg (241.3 mg magnesium) Oral Tablet Take 400 mg by mouth   . methocarbamol (ROBAXIN) 500 mg Oral Tablet    . NARCAN 4 mg/actuation Nasal Spray, Non-Aerosol INSTILL 1 SPRAY NASALLY AS NEEDED PER CDC RECOMMENDATIONS   . omega-3 fatty acid (LOVAZA) 1 gram Oral Capsule take 2 capsules by mouth twice a day   . omeprazole (PRILOSEC) 40 mg Oral Capsule, Delayed Release(E.C.) take 1 capsule by mouth once daily   . oxyCODONE-acetaminophen (PERCOCET) 10-325 mg Oral Tablet take 1 tablet by mouth three times a day if needed for pain   . PARoxetine (PAXIL) 10 mg Oral Tablet Take 1 Tab (10 mg total) by mouth Every morning   . sildenafiL, pulm.hypertension, (REVATIO) 20 mg Tablet Use 1-5 tablets as needed prior to intercourse for ED   . SYMBICORT 160-4.5 mcg/actuation Inhalation HFA Aerosol Inhaler inhale 2 puffs by mouth twice a day   . zafirlukast (ACCOLATE) 20 mg Oral Tablet take 1 tablet by mouth twice a day before meals   . zolpidem (AMBIEN) 10 mg Oral Tablet take 1 tablet by mouth at bedtime if needed     ROS:  Constitutional:  No fever or chills.  No recent illnesses.  Denies any known exposure to individuals with coronavirus infection.  HENT:  No nasal drainage or congestion, URI symptoms or pharyngitis.  Eyes:  No eye drainage or redness.  Respiratory:  As noted above.  No current cough or wheezing.  Cardiovascular:  Chest pain as noted above.  Denies palpitations or lower extremity edema.  GI:  No abdominal pain or rectal bleeding.  Notes  occasional heartburn.  GU:  No dysuria or hematuria.  Musculoskeletal:  Prior foot pain resolved as noted above.  No recent joint injuries, falls  or fractures.  Skin:  No rashes or lesions.  No jaundice.  Neuro:  No seizures, tremors or syncope.  Psych/Behavioral:  Insomnia and PTSD as noted above.  Heme:  No bruising or bleeding.    Physical Exam:  BP 120/80   Pulse 70   Temp 36.2 C (97.1 F)   Ht 1.753 m (5\' 9" )   Wt 100 kg (220 lb 8 oz)   SpO2 97%   BMI 32.56 kg/m     Alert and pleasant.  Talkative.  No acute distress.  HEENT:  Pupils reactive without icterus.  No eye drainage.  Mild cerumen in the ear canals bilaterally.  Nares patent without drainage.  Moist mucous membranes without erythema or exudate.  Neck:  Supple, nontender without adenopathy or masses.  No carotid bruits.  Lungs:  Clear to auscultation bilaterally.  No wheezing, rales or rhonchi.  Chest wall expansion symmetrical.  No labored breathing.  No cough.  Cardiac:  Regular rate and rhythm.  No murmur, gallop or rub.  No ectopic beats.  No irregularity.  Abdomen:  Soft, nontender, non-distended with normoactive bowel sounds.  No appreciable organomegaly or masses.  Extremities:  Warm with no cyanosis or edema.  No erythema.  Capillary refill less than 3 seconds.  Normal skin turgor.  Neuro:  Speech is clear and tongue is midline.  No facial asymmetry.  No tremor.  Ambulated without use of assistive device.  Skin:  No rash or jaundice.  Psych:  Answered questions appropriately.  Did not appear anxious.  Patient was alert and interactive during the visit.  Affect appeared upbeat.      ICD-10-CM    1. Essential hypertension  I10    2. Hyperlipidemia LDL goal <130  E78.5    3. Chronic musculoskeletal pain  M79.18     G89.29    4. Moderate persistent asthma without complication  123456    5. Vitamin D deficiency  E55.9    6. Chronic insomnia  F51.04    7. PTSD (post-traumatic stress disorder)  F43.10    8. Prediabetes  R73.03    9. Sexual  dysfunction  R37    10. Chest pain, unspecified type  R07.9 Stress Test-Adult   11. Need for pneumococcal vaccination  Z23 PNEUMOCOCCAL POLYVALENT VACCINE(PNEUMOVAX)(ADMIN)     Labs completed 03/05/2019 with stable electrolytes which GFR greater than 60.  Glucose level was 114. Hepatic studies stable.  Cholesterol 119, triglycerides 155 and LDL 55. Uric acid level 4.8.  Magnesium 1.8.  CBC with normal WBC, hemoglobin 13.6 and platelets 192. PSA level 1.5.  Continue current cardiac regimen as blood pressure currently stable.  Continue Lipitor for treatment of hyperlipidemia.  Patient did not have x-ray completed of the foot.  Reports resolution of prior complaints of pain.  Prior PFTs appear to be stable.  Patient had stress test completed several years ago per review of Care everywhere.  Will re-evaluate cardiac status with stress test.  Discussed proper use of albuterol with patient.  Continue Symbicort at present dosing.  Continue vitamin-D supplementation.  Nighttime use of albuterol may be factoring in with his insomnia complaints.  Discussed use of Ambien on an intermittent basis rather than nightly.  Discussed potentially changing Paxil dose to 20 mg but patient declined 1 disc stay at the current dosing.  Continue Revatio for treatment of sexual dysfunction.  Discussed that this can sometimes be a side effect of medications such as Paxil.  Patient wishes to continue current therapy  as noted above.  Pneumovax ordered.  Follow-up in 2 months or sooner as needed.    Lorelee New, MD    This dictation was transcribed using voice recognition software and may contain minor typographical errors.

## 2019-05-07 NOTE — Nursing Note (Signed)
Follow UP

## 2019-05-14 ENCOUNTER — Other Ambulatory Visit: Payer: Self-pay

## 2019-05-14 ENCOUNTER — Emergency Department: Payer: Medicare (Managed Care)

## 2019-05-14 ENCOUNTER — Emergency Department
Admission: EM | Admit: 2019-05-14 | Discharge: 2019-05-14 | Disposition: A | Discharge status: Home / Self Care | Payer: Medicare (Managed Care) | Attending: Student | Admitting: Student

## 2019-05-14 DIAGNOSIS — E119 Type 2 diabetes mellitus without complications: Secondary | ICD-10-CM | POA: Insufficient documentation

## 2019-05-14 DIAGNOSIS — J45909 Unspecified asthma, uncomplicated: Secondary | ICD-10-CM | POA: Diagnosis not present

## 2019-05-14 DIAGNOSIS — R55 Syncope and collapse: Secondary | ICD-10-CM | POA: Insufficient documentation

## 2019-05-14 DIAGNOSIS — I10 Essential (primary) hypertension: Secondary | ICD-10-CM | POA: Insufficient documentation

## 2019-05-14 DIAGNOSIS — I259 Chronic ischemic heart disease, unspecified: Secondary | ICD-10-CM | POA: Diagnosis not present

## 2019-05-14 DIAGNOSIS — R079 Chest pain, unspecified: Secondary | ICD-10-CM | POA: Insufficient documentation

## 2019-05-14 DIAGNOSIS — R4020 Unspecified coma: Secondary | ICD-10-CM

## 2019-05-14 DIAGNOSIS — R1013 Epigastric pain: Secondary | ICD-10-CM | POA: Diagnosis not present

## 2019-05-14 HISTORY — DX: Essential (primary) hypertension: I10

## 2019-05-14 HISTORY — DX: Unspecified asthma, uncomplicated: J45.909

## 2019-05-14 LAB — URINALYSIS, COMPLETE (UACMP) WITH MICROSCOPIC
Bacteria, UA: NONE SEEN
Bilirubin Urine: NEGATIVE
Glucose, UA: NEGATIVE mg/dL
Hgb urine dipstick: NEGATIVE
Ketones, ur: NEGATIVE mg/dL
Leukocytes,Ua: NEGATIVE
Nitrite: NEGATIVE
Protein, ur: NEGATIVE mg/dL
Specific Gravity, Urine: 1.011 (ref 1.005–1.030)
Squamous Epithelial / HPF: NONE SEEN (ref 0–5)
WBC, UA: NONE SEEN WBC/hpf (ref 0–5)
pH: 7 (ref 5.0–8.0)

## 2019-05-14 LAB — URINE DRUG SCREEN, QUALITATIVE (ARMC ONLY)
Amphetamines, Ur Screen: NOT DETECTED
Barbiturates, Ur Screen: NOT DETECTED
Benzodiazepine, Ur Scrn: NOT DETECTED
Cannabinoid 50 Ng, Ur ~~LOC~~: NOT DETECTED
Cocaine Metabolite,Ur ~~LOC~~: NOT DETECTED
MDMA (Ecstasy)Ur Screen: NOT DETECTED
Methadone Scn, Ur: NOT DETECTED
Opiate, Ur Screen: POSITIVE — AB
Phencyclidine (PCP) Ur S: NOT DETECTED
Tricyclic, Ur Screen: NOT DETECTED

## 2019-05-14 LAB — CBC WITH DIFFERENTIAL/PLATELET
Abs Immature Granulocytes: 0.02 10*3/uL (ref 0.00–0.07)
Basophils Absolute: 0.1 10*3/uL (ref 0.0–0.1)
Basophils Relative: 1 %
Eosinophils Absolute: 0.2 10*3/uL (ref 0.0–0.5)
Eosinophils Relative: 2 %
HCT: 43 % (ref 39.0–52.0)
Hemoglobin: 15.1 g/dL (ref 13.0–17.0)
Immature Granulocytes: 0 %
Lymphocytes Relative: 33 %
Lymphs Abs: 2.7 10*3/uL (ref 0.7–4.0)
MCH: 28 pg (ref 26.0–34.0)
MCHC: 35.1 g/dL (ref 30.0–36.0)
MCV: 79.8 fL — ABNORMAL LOW (ref 80.0–100.0)
Monocytes Absolute: 0.7 10*3/uL (ref 0.1–1.0)
Monocytes Relative: 9 %
Neutro Abs: 4.6 10*3/uL (ref 1.7–7.7)
Neutrophils Relative %: 55 %
Platelets: 218 10*3/uL (ref 150–400)
RBC: 5.39 MIL/uL (ref 4.22–5.81)
RDW: 13.5 % (ref 11.5–15.5)
WBC: 8.3 10*3/uL (ref 4.0–10.5)
nRBC: 0 % (ref 0.0–0.2)

## 2019-05-14 LAB — COMPREHENSIVE METABOLIC PANEL
ALT: 17 U/L (ref 0–44)
AST: 15 U/L (ref 15–41)
Albumin: 4.2 g/dL (ref 3.5–5.0)
Alkaline Phosphatase: 52 U/L (ref 38–126)
Anion gap: 13 (ref 5–15)
BUN: 16 mg/dL (ref 6–20)
CO2: 22 mmol/L (ref 22–32)
Calcium: 9.5 mg/dL (ref 8.9–10.3)
Chloride: 103 mmol/L (ref 98–111)
Creatinine, Ser: 0.88 mg/dL (ref 0.61–1.24)
GFR calc Af Amer: >60 mL/min (ref >60)
GFR calc non Af Amer: >60 mL/min (ref >60)
Glucose, Bld: 96 mg/dL (ref 70–99)
Potassium: 3.5 mmol/L (ref 3.5–5.1)
Sodium: 138 mmol/L (ref 135–145)
Total Bilirubin: 0.7 mg/dL (ref 0.3–1.2)
Total Protein: 6.9 g/dL (ref 6.5–8.1)

## 2019-05-14 LAB — GLUCOSE, CAPILLARY: Glucose-Capillary: 94 mg/dL (ref 70–99)

## 2019-05-14 LAB — LIPASE, BLOOD: Lipase: 21 U/L (ref 11–51)

## 2019-05-14 LAB — TROPONIN I (HIGH SENSITIVITY): Troponin I (High Sensitivity): 4 ng/L (ref <18)

## 2019-05-14 IMAGING — MR MR HEAD W/O CM
11 series · 42 of 48 positions shown · non-contrast
Comparison: CT head [DATE]

CLINICAL DATA: Confusion.  Word finding difficulty

EXAM:
MRI HEAD WITHOUT CONTRAST
TECHNIQUE: Multiplanar, multiecho pulse sequences of the brain and surrounding
structures were obtained without intravenous contrast.

[Series 5: ax dwi_tracew · axial · 3.0mm · 0.60mm/px · z∈[-85,+65]mm · 5 of 48 slices shown]
[im 1/48]
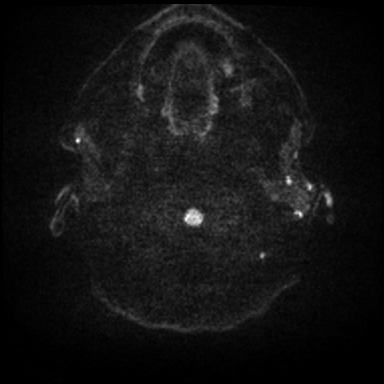
[im 12/48]
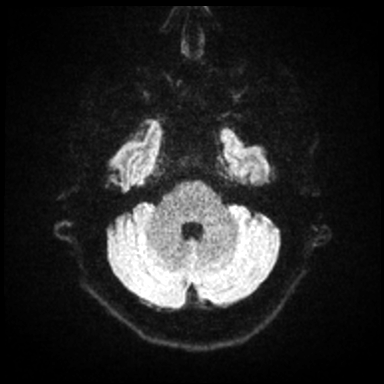
[im 24/48]
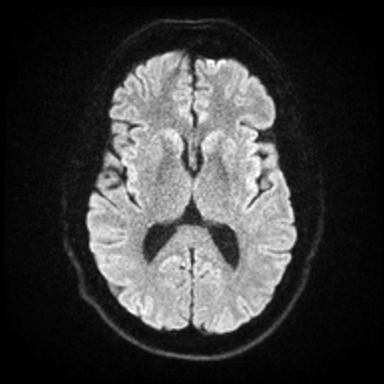
[im 36/48]
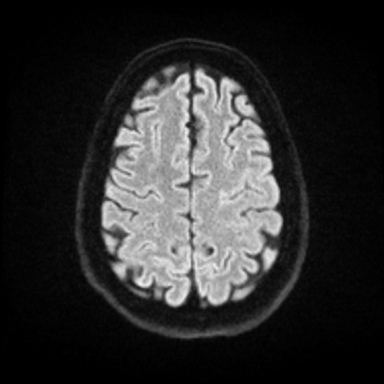
[im 48/48]
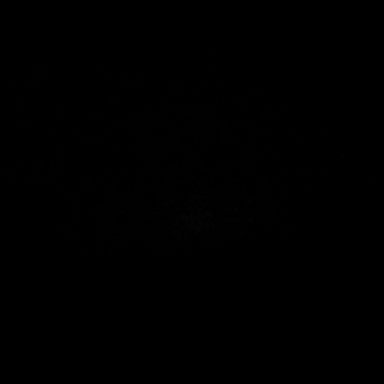

[Series 6: ax dwi_adc · axial · 3.0mm · 0.60mm/px · z∈[-85,+62]mm · 4 of 47 slices shown]
[im 1/47]
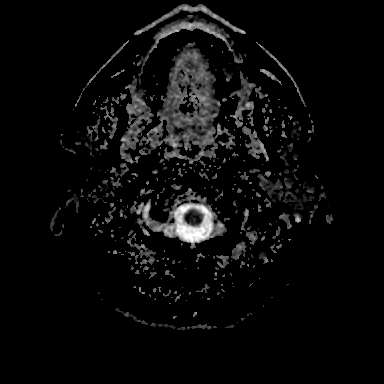
[im 16/47]
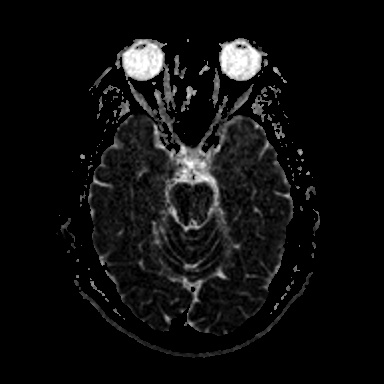
[im 31/47]
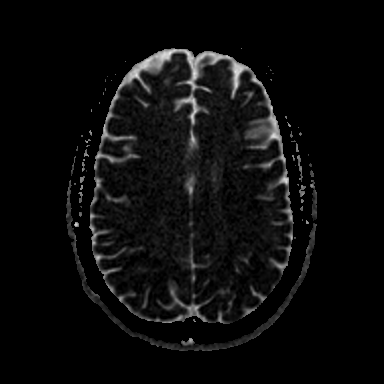
[im 47/47]
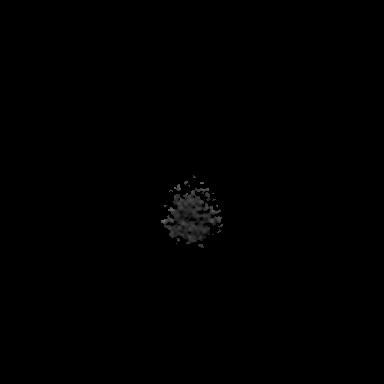

[Series 7: cor dwi_tracew · coronal · 5.0mm · 0.60mm/px · 3 of 40 slices shown]
[im 1/40]
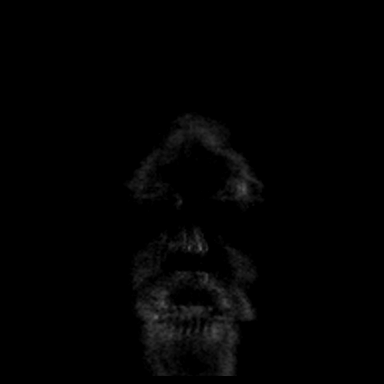
[im 20/40]
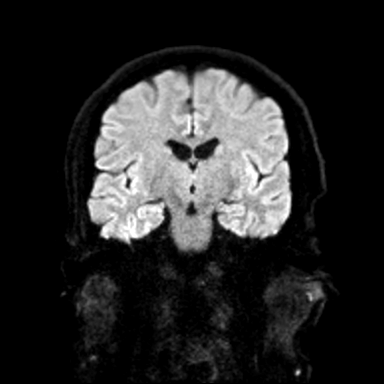
[im 40/40]
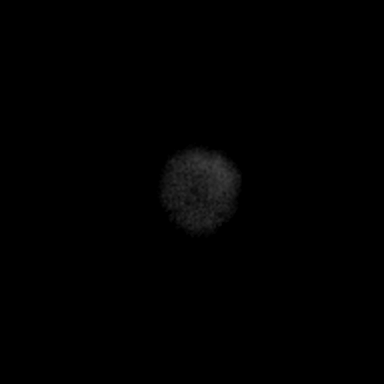

[Series 8: cor dwi_adc · coronal · 5.0mm · 0.60mm/px · 3 of 40 slices shown]
[im 1/40]
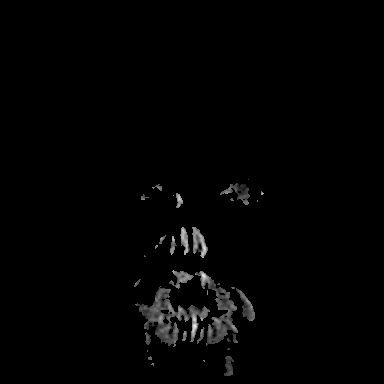
[im 20/40]
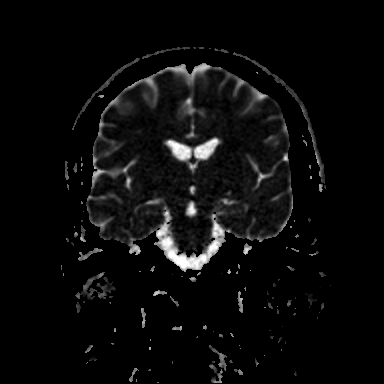
[im 40/40]
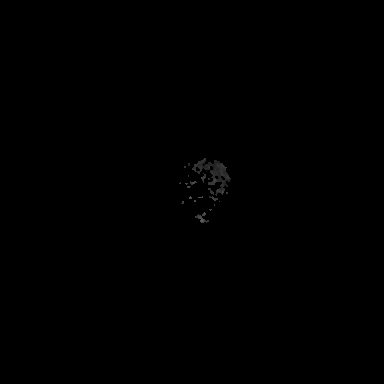

[Series 9: T1 · sagittal · 5.0mm · 0.62mm/px · 2 of 25 slices shown (1 of 2)]
[im 1/25]
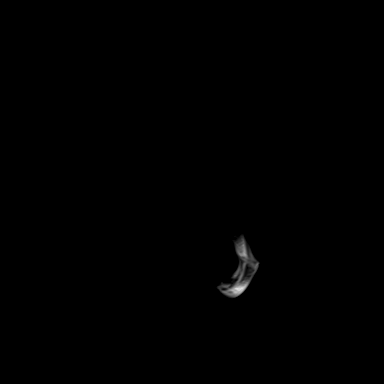
[im 25/25]
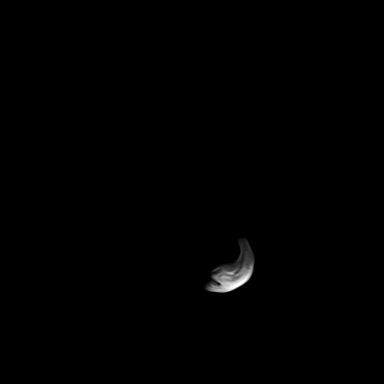

[Series 10: T2 · axial · 5.0mm · 0.53mm/px · z∈[-78,+61]mm · 2 of 25 slices shown (1 of 2)]
[im 1/25]
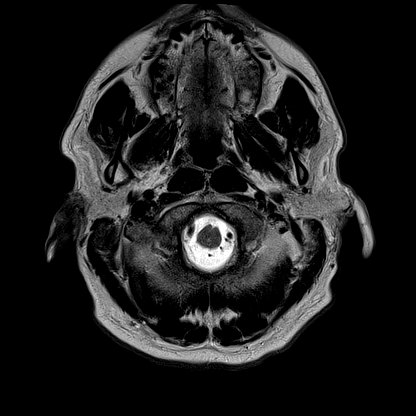
[im 25/25]
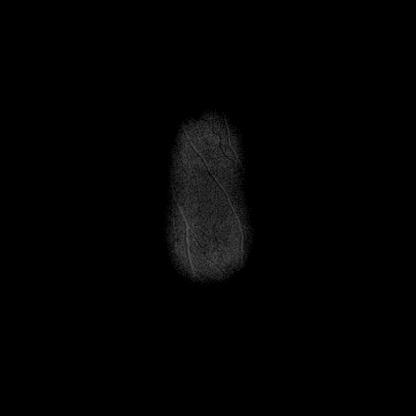

[Series 12: pha_images · axial · 3.0mm · 0.90mm/px · z∈[-95,+65]mm · 4 of 56 slices shown]
[im 1/56]
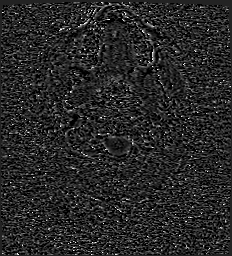
[im 19/56]
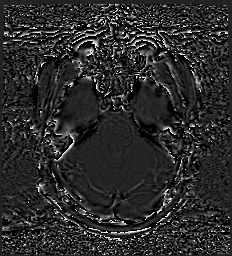
[im 37/56]
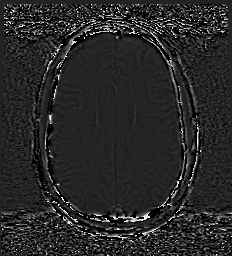
[im 56/56]
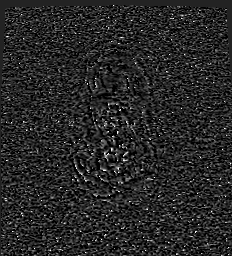

[Series 13: swi_images · axial · 3.0mm · 0.90mm/px · z∈[-95,+77]mm · 5 of 60 slices shown]
[im 1/60]
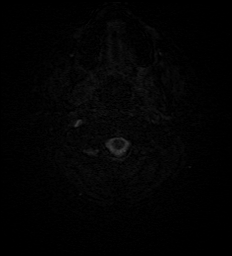
[im 15/60]
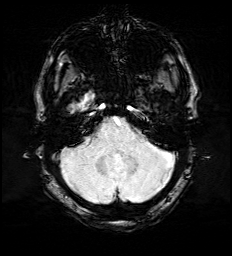
[im 30/60]
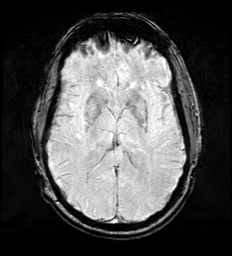
[im 45/60]
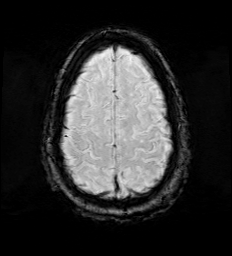
[im 60/60]
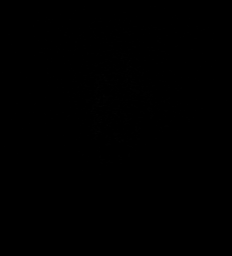

[Series 15: FLAIR · axial · 3.0mm · 0.53mm/px · z∈[-86,+70]mm · 4 of 55 slices shown]
[im 1/55]
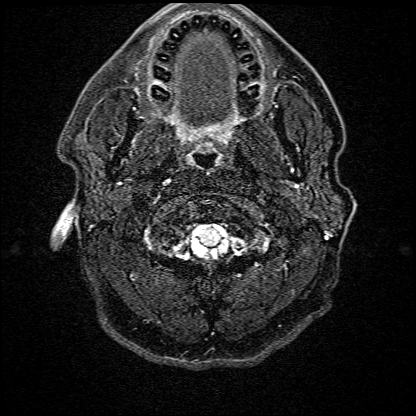
[im 19/55]
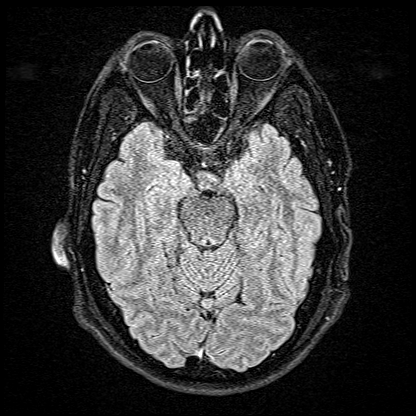
[im 37/55]
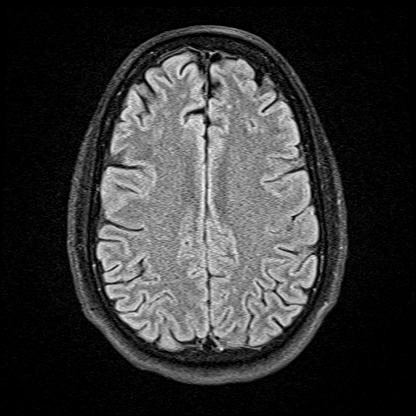
[im 55/55]
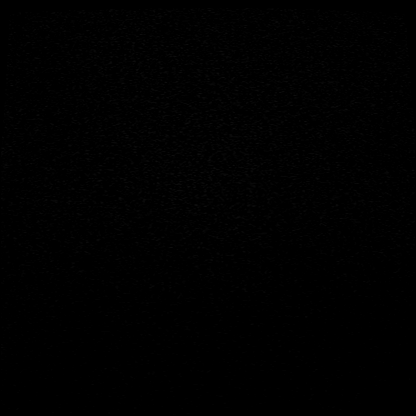

[Series 16: T1 · axial · 1.0mm · 0.98mm/px · z∈[-103,+66]mm · 8 of 176 slices shown (2 of 2)]
[im 1/176]
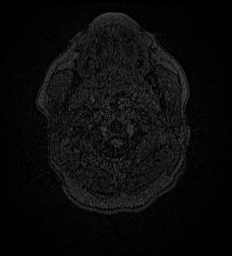
[im 27/176]
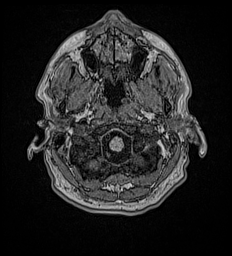
[im 54/176]
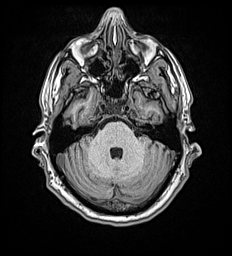
[im 81/176]
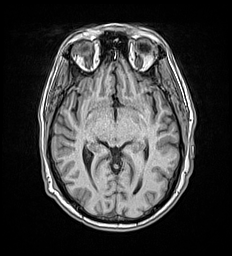
[im 95/176]
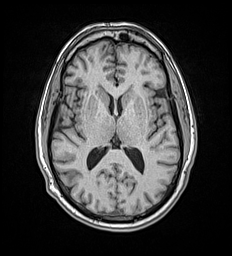
[im 122/176]
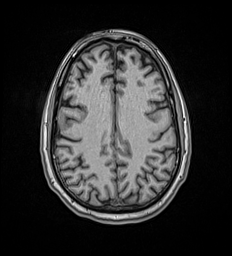
[im 149/176]
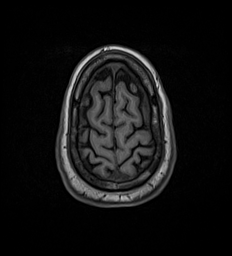
[im 176/176]
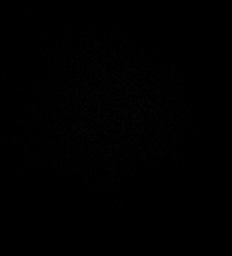

[Series 17: T2 · coronal · 5.0mm · 0.57mm/px · 2 of 31 slices shown (2 of 2)]
[im 1/31]
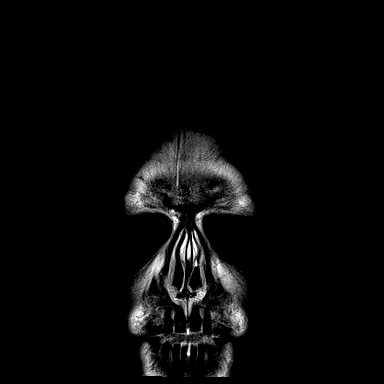
[im 31/31]
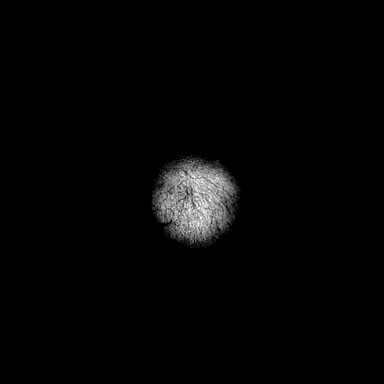

[42 of 48 positions shown; findings below may reference images not displayed]

FINDINGS: Brain: Negative for acute infarct. Chronic encephalomalacia right
anterior and inferior frontal lobe. Small area of encephalomalacia
left anterior frontal lobe. Probable chronic trauma. Patchy white
matter hypodensities, small and mostly in the frontal lobe.
Brainstem and cerebellum normal. Negative for hemorrhage

9 mm nodule in the sella best seen on sagittal images. This is not
hyperdense on CT. This appears hypointense on T2. Limited evaluation
due to small size.

Vascular: Normal arterial flow voids.

Skull and upper cervical spine: No focal skeletal lesion.

Sinuses/Orbits: Negative

Other: None
IMPRESSION: No acute abnormality

Mild encephalomalacia in both frontal lobes anteriorly likely due to
prior head trauma.

Multiple small white matter hyperintensities. Possible chronic
microvascular ischemia, vasculitis, complex migraine headache.

9 mm lesion within the sella possibly Rathke's cleft cyst or
pituitary adenoma.

## 2019-05-14 MED ORDER — IOHEXOL 350 MG/ML SOLN
100.0000 mL | Freq: Once | INTRAVENOUS | Status: AC | PRN
  Administered 2019-05-14: 100 mL via INTRAVENOUS

## 2019-05-14 MED ORDER — ALUM & MAG HYDROXIDE-SIMETH 200-200-20 MG/5ML PO SUSP
30.0000 mL | Freq: Once | ORAL | Status: AC
  Administered 2019-05-14: 20:00:00 30 mL via ORAL
  Filled 2019-05-14: qty 30

## 2019-05-14 MED ORDER — MORPHINE SULFATE (PF) 4 MG/ML IV SOLN
4.0000 mg | Freq: Once | INTRAVENOUS | Status: AC
  Administered 2019-05-14: 4 mg via INTRAVENOUS
  Filled 2019-05-14: qty 1

## 2019-05-14 MED ORDER — SODIUM CHLORIDE 0.9 % IV BOLUS
1000.0000 mL | Freq: Once | INTRAVENOUS | Status: AC
  Administered 2019-05-14: 1000 mL via INTRAVENOUS

## 2019-05-14 NOTE — ED Notes (Signed)
Called CT to inquire when would be completed. Stated waiting on labs to result.

## 2019-05-14 NOTE — ED Notes (Signed)
Clarence Lee pt's significant other updated by pt's verbal request.

## 2019-05-14 NOTE — ED Notes (Signed)
EKG completed

## 2019-05-14 NOTE — ED Notes (Signed)
Called CT. CT states will take pt for imaging soon.

## 2019-05-14 NOTE — ED Notes (Signed)
Pt given warm blanket; bed locked low; rails up; call bell within reach; urinal attached to bedrail. Pt agrees to provide urine sample when possible.
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
## 2019-05-14 NOTE — ED Notes (Signed)
2nd EKG completed and given to [facility] as pt reported pain getting worse in chest and arms.

## 2019-05-14 NOTE — ED Notes (Signed)
Pt signed printed d/c paperwork as topaz frozen. Shelly, pt's significant other, is out at front of ER.

## 2019-05-14 NOTE — Discharge Instructions (Addendum)
Thank you for letting us take care of you in the emergency department today.   Please continue to take any regular, prescribed medications.   Please establish care with a primary care doctor, as well as a Neurologist, and follow-up in the clinic.  Information for this is provided below.  It is important that you have follow-up for your MRI results, which we have listed below.  Please return to the ER for any new or worsening symptoms.   MRI: IMPRESSION: No acute abnormality Mild encephalomalacia in both frontal lobes anteriorly likely due to prior head trauma. Multiple small white matter hyperintensities. Possible chronic microvascular ischemia, vasculitis, complex migraine headache. 9 mm lesion within the sella possibly Rathke's cleft cyst or pituitary adenoma.

## 2019-05-14 NOTE — ED Triage Notes (Signed)
Pt in via EMS from parking of [employer] d/t sudden CP/HA/Abd pain causing pt to become unresponsive. Pt AMS with EMS. Has been having these symptoms for weeks and had negative CT two days ago at [facility]. BP 156/82, HR 76, RR 20, BG 142 with EMS. 18g L ac placed by EMS and pt given 4mg  Zofran by EMS.

## 2019-05-20 ENCOUNTER — Other Ambulatory Visit (INDEPENDENT_AMBULATORY_CARE_PROVIDER_SITE_OTHER): Payer: Self-pay | Admitting: Family Medicine

## 2019-05-21 ENCOUNTER — Encounter (INDEPENDENT_AMBULATORY_CARE_PROVIDER_SITE_OTHER): Payer: Self-pay | Admitting: NURSE PRACTITIONER

## 2019-05-21 ENCOUNTER — Other Ambulatory Visit: Payer: Self-pay

## 2019-05-21 ENCOUNTER — Ambulatory Visit (INDEPENDENT_AMBULATORY_CARE_PROVIDER_SITE_OTHER): Payer: Medicare Other | Admitting: NURSE PRACTITIONER

## 2019-05-21 VITALS — BP 140/90 | HR 77 | Temp 97.7°F | Ht 69.0 in | Wt 219.0 lb

## 2019-05-21 DIAGNOSIS — E669 Obesity, unspecified: Secondary | ICD-10-CM

## 2019-05-21 DIAGNOSIS — R930 Abnormal findings on diagnostic imaging of skull and head, not elsewhere classified: Secondary | ICD-10-CM

## 2019-05-21 DIAGNOSIS — R42 Dizziness and giddiness: Secondary | ICD-10-CM

## 2019-05-21 DIAGNOSIS — G939 Disorder of brain, unspecified: Secondary | ICD-10-CM

## 2019-05-21 MED ORDER — AMLODIPINE 10 MG TABLET
ORAL_TABLET | ORAL | 0 refills | Status: DC
Start: 2019-05-21 — End: 2019-08-08

## 2019-05-21 MED ORDER — LOSARTAN 100 MG TABLET
ORAL_TABLET | ORAL | 0 refills | Status: DC
Start: 2019-05-21 — End: 2019-08-06

## 2019-05-21 MED ORDER — PAROXETINE 10 MG TABLET
10.0000 mg | ORAL_TABLET | Freq: Every day | ORAL | 0 refills | Status: DC
Start: 2019-05-21 — End: 2019-06-21

## 2019-05-21 MED ORDER — OMEPRAZOLE 40 MG CAPSULE,DELAYED RELEASE
40.0000 mg | DELAYED_RELEASE_CAPSULE | Freq: Every day | ORAL | 2 refills | Status: DC
Start: 2019-05-21 — End: 2019-08-17

## 2019-05-21 MED ORDER — CHLORTHALIDONE 25 MG TABLET
ORAL_TABLET | ORAL | 1 refills | Status: DC
Start: 2019-05-21 — End: 2019-09-03

## 2019-05-21 NOTE — Progress Notes (Signed)
PRIMARY CARE, [place]  [place] [address]  Phone: [phone]  Fax: [phone]    Encounter Date: 05/21/2019    Patient ID:  Steve Young  [id number]    DOB: 07/02/1960  Age: 59 y.o. male    Subjective:     Chief Complaint   Patient presents with   . Dizziness     The patient is here today for an acute visit.  He reports he started having dizziness for the past 2 weeks.  He reports he had a syncopal episode last week and was seen in the Emergency Department in [place].  He says he felt confused prior to passing out.  He tells me he had a MRI which showed "three different things including a brain lesion".  He was discharged and told to follow-up with a neurosurgeon.  He reports that he was staying with an ex-girlfriend in [place] and is now back here living permanently.  Today the patient says he is still having episodes of dizziness.  He denies chest pain, shortness of breath, weakness, trouble with gait.  He also tells me he needs some refills of medication today.          Current Outpatient Medications   Medication Sig   . albuterol sulfate (PROVENTIL OR VENTOLIN OR PROAIR) 90 mcg/actuation Inhalation HFA Aerosol Inhaler inhale 1 puff by mouth every 4 hours if needed wheezing   . amLODIPine (NORVASC) 10 mg Oral Tablet take 1 tablet by mouth once daily   . aspirin (ECOTRIN) 81 mg Oral Tablet, Delayed Release (E.C.) take 1 tablet by mouth once daily   . atorvastatin (LIPITOR) 80 mg Oral Tablet take 1 tablet by mouth at bedtime   . chlorthalidone (HYGROTON) 25 mg Oral Tablet take 1 tablet by mouth once daily   . cholecalciferol, vitamin D3, 125 mcg (5,000 unit) Oral Capsule take 1 capsule by mouth once daily   . folic acid (FOLVITE) 1 mg Oral Tablet Take 1 Tab (1 mg total) by mouth Once a day   . gabapentin (NEURONTIN) 600 mg Oral Tablet take 1 tablet by mouth three times a day   . Ibuprofen (MOTRIN) 800 mg Oral Tablet Take 1 Tab (800 mg total) by  mouth Three times a day as needed for Pain (with food)   . losartan (COZAAR) 100 mg Oral Tablet take 1 tablet by mouth once daily   . lubiprostone (AMITIZA) 24 mcg Oral Capsule Take 1 Cap (24 mcg total) by mouth Twice daily   . magnesium oxide (MAG-OX) 400 mg (241.3 mg magnesium) Oral Tablet Take 400 mg by mouth   . methocarbamol (ROBAXIN) 500 mg Oral Tablet    . NARCAN 4 mg/actuation Nasal Spray, Non-Aerosol INSTILL 1 SPRAY NASALLY AS NEEDED PER CDC RECOMMENDATIONS   . omega-3 fatty acid (LOVAZA) 1 gram Oral Capsule take 2 capsules by mouth twice a day   . omeprazole (PRILOSEC) 40 mg Oral Capsule, Delayed Release(E.C.) take 1 capsule by mouth once daily   . oxyCODONE-acetaminophen (PERCOCET) 10-325 mg Oral Tablet take 1 tablet by mouth three times a day if needed for pain   . PARoxetine (PAXIL) 10 mg Oral Tablet Take 10 mg by mouth Once a day   . Sildenafil (VIAGRA) 25 mg Oral Tablet take 1 tablet by mouth every 24 hours if needed   . SYMBICORT 160-4.5 mcg/actuation Inhalation HFA Aerosol Inhaler inhale 2 puffs by mouth twice a day   . zafirlukast (  ACCOLATE) 20 mg Oral Tablet take 1 tablet by mouth twice a day before meals   . zolpidem (AMBIEN) 10 mg Oral Tablet take 1 tablet by mouth at bedtime if needed     No Known Allergies  Past Medical History:   Diagnosis Date   . Arthralgia of hip 03/21/2018   . Arthralgia of right upper arm 03/30/2016   . Asthma    . Cervicalgia 03/21/2018   . Chronic abdominal pain 10/04/2018   . Chronic back pain 03/21/2018   . Chronic chest pain 03/21/2018   . Chronic insomnia    . Constipation in male 09/13/2017   . Esophageal reflux    . 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 Hepatic lesion 10/04/2018   . Hyperlipidemia    . Hypertension    . Impingement syndrome of left shoulder 12/28/2017   . Mild persistent asthma without complication 0000000   . Normal cardiac stress test 03/21/2018   . Obesity    . 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 Partial tear of common extensor tendon  of elbow 12/13/2017   . Pre-diabetes    . Rib pain 10/04/2018   . Sexual dysfunction    . Tear of talofibular ligament 05/30/2013    RIGHT   . Vitamin D deficiency          Past Surgical History:   Procedure Laterality Date   . COLONOSCOPY     . ELBOW SURGERY Left 12/2017   . HX OTHER  2004     SPINAL FUSION,ANT,EA ADNL LEVEL c3-c7         Family Medical History:     Family history is unknown by patient.            Social History     Tobacco Use   . Smoking status: Never Smoker   . Smokeless tobacco: Never Used   Substance Use Topics   . Alcohol use: Not Currently   . Drug use: Never       Review of Systems   Constitutional: Negative.    HENT: Negative.    Eyes: Negative.    Respiratory: Negative.    Cardiovascular: Negative.    Gastrointestinal: Negative.    Endocrine: Negative.    Genitourinary: Negative.    Musculoskeletal: Negative.    Skin: Negative.    Neurological: Positive for dizziness.   Hematological: Negative.    Psychiatric/Behavioral: Negative.      Objective:   Vitals: BP (!) 140/90 (Site: Left, Patient Position: Sitting, Cuff Size: Adult Large)   Pulse 77   Temp 36.5 C (97.7 F) (Thermal Scan)   Ht 1.753 m (5\' 9" )   Wt 99.3 kg (219 lb)   SpO2 96%   BMI 32.34 kg/m         Physical Exam  Constitutional:       Appearance: Normal appearance.   HENT:      Head: Normocephalic and atraumatic.      Right Ear: Tympanic membrane normal.      Left Ear: Tympanic membrane normal.      Nose: Nose normal.      Mouth/Throat:      Mouth: Mucous membranes are moist.   Eyes:      Extraocular Movements: Extraocular movements intact.      Pupils: Pupils are equal, round, and reactive to light.   Neck:      Musculoskeletal: Normal range of motion and neck supple.   Cardiovascular:  Rate and Rhythm: Normal rate and regular rhythm.      Pulses: Normal pulses.      Heart sounds: Normal heart sounds.   Pulmonary:      Effort: Pulmonary effort is normal.      Breath sounds: Normal breath sounds.   Abdominal:       Palpations: Abdomen is soft.      Tenderness: There is no abdominal tenderness.   Musculoskeletal: Normal range of motion.   Skin:     General: Skin is warm and dry.      Capillary Refill: Capillary refill takes less than 2 seconds.   Neurological:      General: No focal deficit present.      Mental Status: He is alert and oriented to person, place, and time.   Psychiatric:         Mood and Affect: Mood normal.         Behavior: Behavior normal.         Assessment & Plan:       ICD-10-CM    1. Dizziness  R42 ECG 12-LEAD     OUTSIDE CONSULT/REFERRAL PROVIDER(AMB)   2. Brain lesion  G93.9 OUTSIDE CONSULT/REFERRAL PROVIDER(AMB)   3. Abnormal MRI of head  R93.0 OUTSIDE CONSULT/REFERRAL PROVIDER(AMB)   4. Obesity (BMI 30.0-34.9)  E66.9        Orders Placed This Encounter   . OUTSIDE CONSULT/REFERRAL PROVIDER(AMB)   . ECG 12-LEAD   . amLODIPine (NORVASC) 10 mg Oral Tablet   . chlorthalidone (HYGROTON) 25 mg Oral Tablet   . losartan (COZAAR) 100 mg Oral Tablet   . omeprazole (PRILOSEC) 40 mg Oral Capsule, Delayed Release(E.C.)   . PARoxetine (PAXIL) 10 mg Oral Tablet       The patient was unsure what was said upon his discharge from the emergency department in [place].  We will have the office staff obtain those records.  He was able to call his girlfriend did put her on speaker phone during his appointment.  His girlfriend red off the MRI report from the ER at the hospital in [place] that said he had a 9 mm lesion.  She also mentions lesion on the frontal lobe per the report.  A referral will be sent to Neurosurgery today.  I advised the patient to sign a release of records to obtain those reports to sent to Neurosurgery.  I also advised them to print off their report and bring into the office so that we can fax it with a referral in wrist surgery.  Patient was advised if symptoms worsen at all to call the office or report to the emergency department and verbalized understanding.  I did send refills of his  requested medications to the pharmacy for him today.    [employer], CRNP

## 2019-05-21 NOTE — Nursing Note (Signed)
Pt in office for fainting spells while in [place], he was there for 3 weeks.  He did not complete any testing due to not staying long enough. He states that he lost all of his medications and wants to have them refilled.

## 2019-05-21 NOTE — Telephone Encounter (Signed)
Last Visit:05/07/2019     Upcoming appointments: 05/22/19          Roe Coombs  05/21/2019, 14:43

## 2019-05-22 ENCOUNTER — Telehealth (INDEPENDENT_AMBULATORY_CARE_PROVIDER_SITE_OTHER): Payer: Self-pay | Admitting: NURSE PRACTITIONER

## 2019-05-22 ENCOUNTER — Ambulatory Visit (INDEPENDENT_AMBULATORY_CARE_PROVIDER_SITE_OTHER): Payer: Self-pay | Admitting: Internal Medicine

## 2019-05-22 ENCOUNTER — Ambulatory Visit (INDEPENDENT_AMBULATORY_CARE_PROVIDER_SITE_OTHER): Payer: Self-pay | Admitting: NURSE PRACTITIONER

## 2019-05-22 NOTE — Telephone Encounter (Signed)
Faxed script and information to [facility].  They will call the patient with date and time.

## 2019-05-24 DIAGNOSIS — R079 Chest pain, unspecified: Secondary | ICD-10-CM

## 2019-05-25 ENCOUNTER — Other Ambulatory Visit (INDEPENDENT_AMBULATORY_CARE_PROVIDER_SITE_OTHER): Payer: Self-pay

## 2019-05-25 DIAGNOSIS — R079 Chest pain, unspecified: Secondary | ICD-10-CM

## 2019-05-28 ENCOUNTER — Ambulatory Visit (INDEPENDENT_AMBULATORY_CARE_PROVIDER_SITE_OTHER): Payer: Self-pay

## 2019-05-28 ENCOUNTER — Ambulatory Visit (INDEPENDENT_AMBULATORY_CARE_PROVIDER_SITE_OTHER): Payer: Self-pay | Admitting: Neurological Surgery

## 2019-05-28 NOTE — Telephone Encounter (Addendum)
Regarding: Steve Young  ----- Message from Sharee Holster sent at 05/23/2019  3:47 PM EST -----  Patient returning call to [facility] about Imaging.  ==========================================  I called and left a message advising pt that he will need to bring his imaging on a disc to his appointment.  Hamilton Capri, RN  05/28/2019, 10:12

## 2019-05-28 NOTE — Telephone Encounter (Signed)
I sent message to Kathleen Argue 05-28-19

## 2019-05-28 NOTE — Telephone Encounter (Signed)
Regarding: Steve Young  ----- Message from Belcher sent at 05/28/2019 11:35 AM EST -----  Legrand Como was calling to speak with Sharyn Lull about the CDs he is suppose to bring tot he apt. Please call [place] and advise

## 2019-05-31 ENCOUNTER — Other Ambulatory Visit (INDEPENDENT_AMBULATORY_CARE_PROVIDER_SITE_OTHER): Payer: Self-pay | Admitting: Family Medicine

## 2019-05-31 DIAGNOSIS — J4541 Moderate persistent asthma with (acute) exacerbation: Secondary | ICD-10-CM

## 2019-05-31 NOTE — Telephone Encounter (Signed)
Last Visit:05/07/2019     Upcoming appointments: 07-09-19          Roe Coombs  05/31/2019, 13:41

## 2019-06-01 ENCOUNTER — Ambulatory Visit (INDEPENDENT_AMBULATORY_CARE_PROVIDER_SITE_OTHER): Payer: Self-pay | Admitting: Neurological Surgery

## 2019-06-01 NOTE — Telephone Encounter (Signed)
1/15/21Netta Corrigan @ [facility] [phone] who is faxing over the CT and MRI reports and mailing out the discs to [place].  I notified of address, attn to, fax number. I called Algird and notified him as well. He verbalized understanding.jlm

## 2019-06-02 ENCOUNTER — Inpatient Hospital Stay (HOSPITAL_COMMUNITY)
Admission: EM | Admit: 2019-06-02 | Discharge: 2019-06-02 | Disposition: A | Discharge status: Home / Self Care | Payer: Medicare Other | Source: Other Acute Inpatient Hospital

## 2019-06-02 ENCOUNTER — Other Ambulatory Visit (INDEPENDENT_AMBULATORY_CARE_PROVIDER_SITE_OTHER): Payer: Self-pay | Admitting: Family Medicine

## 2019-06-02 DIAGNOSIS — M25562 Pain in left knee: Secondary | ICD-10-CM

## 2019-06-04 ENCOUNTER — Ambulatory Visit (INDEPENDENT_AMBULATORY_CARE_PROVIDER_SITE_OTHER): Payer: Self-pay | Admitting: Neurological Surgery

## 2019-06-04 NOTE — Telephone Encounter (Signed)
Last Visit:05/07/2019     Upcoming appointments: 07-09-19        Roe Coombs  06/04/2019, 08:51

## 2019-06-04 NOTE — Telephone Encounter (Signed)
Patient has not had this medication since November, I typically do not prescribed controlled substances for sleep.

## 2019-06-05 ENCOUNTER — Other Ambulatory Visit (INDEPENDENT_AMBULATORY_CARE_PROVIDER_SITE_OTHER): Payer: Self-pay | Admitting: NURSE PRACTITIONER

## 2019-06-05 DIAGNOSIS — M170 Bilateral primary osteoarthritis of knee: Secondary | ICD-10-CM

## 2019-06-05 DIAGNOSIS — M7918 Myalgia, other site: Secondary | ICD-10-CM

## 2019-06-05 DIAGNOSIS — G8929 Other chronic pain: Secondary | ICD-10-CM

## 2019-06-05 MED ORDER — IBUPROFEN 800 MG TABLET
800.00 mg | ORAL_TABLET | Freq: Three times a day (TID) | ORAL | 1 refills | Status: DC | PRN
Start: 2019-06-05 — End: 2019-08-01

## 2019-06-05 NOTE — Telephone Encounter (Signed)
Regarding: DR Shona Simpson, refill and fax scripts  ----- Message from Marzetta Board sent at 06/05/2019  3:25 PM EST -----  Wynonia Hazard, MD    Pt called for refill:     Ibuprofen (MOTRIN) 800 mg Oral Tablet 90 Tab 1 11/28/2018    Sig - Route: Take 1 Tab (800 mg total) by mouth Three times a day as needed for Pain (with food) - Oral   Sent to pharmacy as: ibuprofen 800 mg tablet (MOTRIN)   Class: E-Rx   Non-formulary Exception Code: [id number] Formulary Info Available   E-Prescribing Status: Receipt confirmed by pharmacy (11/28/2018 10:41 AM EDT)     Preferred Pharmacy     [facility], [place]    [place] [address]    Phone: [phone] Fax: [phone]    Not a 24 hour pharmacy; exact hours not known.     Also, patient said he lost scripts for last lab work and x ray ordered, requests faxed to  [facility] - asking if labs are fasting? He is going next week.        Thank you,  Marzetta Board

## 2019-06-05 NOTE — Telephone Encounter (Signed)
Pt needs a refill on pinned medication. Last ov 05/07/19 next f/u on 07/09/19.

## 2019-06-10 ENCOUNTER — Other Ambulatory Visit (INDEPENDENT_AMBULATORY_CARE_PROVIDER_SITE_OTHER): Payer: Self-pay | Admitting: Family Medicine

## 2019-06-11 ENCOUNTER — Encounter (INDEPENDENT_AMBULATORY_CARE_PROVIDER_SITE_OTHER): Payer: Self-pay | Admitting: Neurological Surgery

## 2019-06-11 ENCOUNTER — Other Ambulatory Visit: Payer: Self-pay

## 2019-06-11 ENCOUNTER — Ambulatory Visit: Payer: Medicare Other | Attending: PHYSICIAN ASSISTANT | Admitting: Neurological Surgery

## 2019-06-11 ENCOUNTER — Ambulatory Visit (INDEPENDENT_AMBULATORY_CARE_PROVIDER_SITE_OTHER): Payer: Medicare Other | Admitting: Rheumatology

## 2019-06-11 ENCOUNTER — Other Ambulatory Visit (INDEPENDENT_AMBULATORY_CARE_PROVIDER_SITE_OTHER): Payer: Self-pay | Admitting: Family Medicine

## 2019-06-11 VITALS — BP 163/83 | HR 76 | Temp 96.6°F | Ht 68.78 in | Wt 226.4 lb

## 2019-06-11 DIAGNOSIS — E236 Other disorders of pituitary gland: Secondary | ICD-10-CM

## 2019-06-11 LAB — PROLACTIN: PROLACTIN: 7.3 ng/mL (ref 3.5–15.0)

## 2019-06-11 LAB — CORTISOL, PLASMA OR SERUM: CORTISOL: 6.9 ug/dL

## 2019-06-11 LAB — FSH: FSH: 10.7 IU/L (ref 1.0–12.0)

## 2019-06-11 LAB — THYROID STIMULATING HORMONE (SENSITIVE TSH): TSH: 0.562 u[IU]/mL (ref 0.350–5.000)

## 2019-06-11 LAB — LH: LUTEINIZING HORMONE: 5.5 IU/L (ref 0.5–11.0)

## 2019-06-11 LAB — THYROXINE, FREE (FREE T4): THYROXINE (T4), FREE: 0.93 ng/dL (ref 0.70–1.25)

## 2019-06-11 LAB — TESTOSTERONE, TOTAL, LCMS, SERUM

## 2019-06-11 MED ORDER — ZOLPIDEM 10 MG TABLET
ORAL_TABLET | ORAL | 0 refills | Status: DC
Start: 2019-06-11 — End: 2019-07-06

## 2019-06-11 NOTE — Telephone Encounter (Signed)
Last Visit:05/21/2019     Upcoming appointments: 07/09/2019           Erskine Speed, RN  06/11/2019, 11:57

## 2019-06-11 NOTE — Telephone Encounter (Signed)
Regarding: refill request  ----- Message from Flonnie Hailstone II sent at 06/11/2019  3:16 PM EST -----  Wynonia Hazard, MD    Pt calling in requesting refills on the below medication. Thank you.    zolpidem (AMBIEN) 10 mg Oral Tablet 21 Tab 1 04/16/2019    Sig: take 1 tablet by mouth at bedtime if needed   Sent to pharmacy as: zolpidem 10 mg tablet Lorrin Mais)   Non-formulary Exception Code: [id number] Formulary Info Available     Preferred Pharmacy     [facility], [place]    [place] [address]    Phone: [phone] Fax: [phone]    Not a 24 hour pharmacy; exact hours not known.

## 2019-06-11 NOTE — Telephone Encounter (Signed)
PDMP reviewed. Dr Carlisle Cater has been prescribing this medication. Short term refill provided.

## 2019-06-11 NOTE — Telephone Encounter (Signed)
Last ov 018/04/21 next ov 07/09/19

## 2019-06-11 NOTE — Progress Notes (Signed)
[facility]  New Outpatient/Consult    Steve Young  Date of Service: 06/11/2019  Referring physician: Wynonia Hazard, MD  [address]  [place],  [place] [address]    Gender: male  Handedness: Right handed  Marital Status: Divorced   Job Title (or Former Job): retired: ship yards and Fish farm manager Complaint:   Chief Complaint   Patient presents with   . Pituitary Tumor     or Ratke's cleft cyst w/MRI and CT 05/14/19     History is provided by patient    History of Present Illness  59 year old right handed male who is here for evaluation of his brain imaging.    The patient reports a long h/o h/a. In December 2020, he had a h/a followed by an episode of confusion and was found unresponsive in his car. 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 Here for review.    The patient has daily h/a that improve some with Advil, Motrin, or Tylenol. He gets occasional dizziness, episodic epistaxis, and subtle left sided weakness (to see Ortho for his left knee soon). The patient denies acute vision changes, syncope, sz, acute cognitive changes, recent falls/injuries, dysphagia, incontinence, or hearing loss. No recent endocrine or ophthalmology evaluations.     Past History    Current Outpatient Medications:   .  albuterol sulfate (PROVENTIL OR VENTOLIN OR PROAIR) 90 mcg/actuation Inhalation HFA Aerosol Inhaler, inhale 1 puff by mouth every 4 hours if needed wheezing, Disp: 18 g, Rfl: 1  .  amLODIPine (NORVASC) 10 mg Oral Tablet, take 1 tablet by mouth once daily, Disp: 30 Each, Rfl: 0  .  aspirin (ECOTRIN) 81 mg Oral Tablet, Delayed Release (E.C.), take 1 tablet by mouth once daily, Disp: 90 Tab, Rfl: 3  .  atorvastatin (LIPITOR) 80 mg Oral Tablet, take 1 tablet by mouth at bedtime, Disp: 90 Tab, Rfl: 3  .  chlorthalidone (HYGROTON) 25 mg Oral Tablet, take 1 tablet by mouth once daily, Disp: 30 Each, Rfl: 1  .   cholecalciferol, vitamin D3, 125 mcg (5,000 unit) Oral Capsule, take 1 capsule by mouth once daily, Disp: 90 Cap, Rfl: 1  .  folic acid (FOLVITE) 1 mg Oral Tablet, Take 1 Tab (1 mg total) by mouth Once a day, Disp: 90 Tab, Rfl: 3  .  gabapentin (NEURONTIN) 600 mg Oral Tablet, take 1 tablet by mouth three times a day, Disp: 90 Tab, Rfl: 0  .  Ibuprofen (MOTRIN) 800 mg Oral Tablet, Take 1 Tab (800 mg total) by mouth Three times a day as needed for Pain (with food), Disp: 90 Tab, Rfl: 1  .  losartan (COZAAR) 100 mg Oral Tablet, take 1 tablet by mouth once daily, Disp: 30 Each, Rfl: 0  .  lubiprostone (AMITIZA) 24 mcg Oral Capsule, Take 1 Cap (24 mcg total) by mouth Twice daily, Disp: 60 Cap, Rfl: 3  .  magnesium oxide (MAG-OX) 400 mg (241.3 mg magnesium) Oral Tablet, Take 400 mg by mouth, Disp: , Rfl:   .  methocarbamol (ROBAXIN) 500 mg Oral Tablet, , Disp: , Rfl:   .  NARCAN 4 mg/actuation Nasal Spray, Non-Aerosol, INSTILL 1 SPRAY NASALLY AS NEEDED PER CDC RECOMMENDATIONS, Disp: , Rfl:   .  omega-3 fatty acid (LOVAZA) 1 gram Oral Capsule, take 2 capsules by mouth twice a day, Disp: 360 Cap, Rfl: 1  .  omeprazole (PRILOSEC) 40 mg Oral Capsule, Delayed Release(E.C.), Take 1 Cap (40 mg total) by mouth Once a day, Disp: 30 Cap, Rfl: 2  .  oxyCODONE-acetaminophen (PERCOCET) 10-325 mg Oral Tablet, take 1 tablet by mouth three times a day if needed for pain, Disp: , Rfl: 0  .  PARoxetine (PAXIL) 10 mg Oral Tablet, Take 1 Tab (10 mg total) by mouth Once a day, Disp: 30 Tab, Rfl: 0  .  Sildenafil (VIAGRA) 25 mg Oral Tablet, take 1 tablet by mouth every 24 hours if needed, Disp: 10 Tab, Rfl: 1  .  SYMBICORT 160-4.5 mcg/actuation Inhalation HFA Aerosol Inhaler, inhale 2 puffs by mouth twice a day, Disp: 10.2 g, Rfl: 2  .  zafirlukast (ACCOLATE) 20 mg Oral Tablet, take 1 tablet by mouth twice a day before meals, Disp: 60 Tab, Rfl: 3  .  zolpidem (AMBIEN) 10 mg Oral Tablet, take 1 tablet by mouth at bedtime if needed, Disp: 21  Tab, Rfl: 1   No Known Allergies  Past Medical History:   Diagnosis Date   . Arthralgia of hip 03/21/2018   . Arthralgia of right upper arm 03/30/2016   . Asthma    . Cervicalgia 03/21/2018   . Chronic abdominal pain 10/04/2018   . Chronic back pain 03/21/2018   . Chronic chest pain 03/21/2018   . Chronic insomnia    . Constipation in male 09/13/2017   . Esophageal reflux    . 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 Hepatic lesion 10/04/2018   . Hyperlipidemia    . Hypertension    . Impingement syndrome of left shoulder 12/28/2017   . Mild persistent asthma without complication 0000000   . Normal cardiac stress test 03/21/2018   . Obesity    . 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 Partial tear of common extensor tendon of elbow 12/13/2017   . Pre-diabetes    . Rib pain 10/04/2018   . Sexual dysfunction    . Tear of talofibular ligament 05/30/2013    RIGHT   . Vitamin D deficiency          Past Surgical History:   Procedure Laterality Date   . COLONOSCOPY     . ELBOW SURGERY Left 12/2017   . HX OTHER  2004     SPINAL FUSION,ANT,EA ADNL LEVEL c3-c7   . HX OTHER Right     right great toe   . HX SHOULDER SURGERY Bilateral    . NOSE SURGERY         Family History  Family Medical History:     Problem Relation (Age of Onset)    Cancer Mother, Sister              Social History  Social History     Socioeconomic History   . Marital status: Divorced     Spouse name: Not on file   . Number of children: 1   . Years of education: Not on file   . Highest education level: Not on file   Occupational History   . 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 Smoking status: Never Smoker   . Smokeless tobacco: Never Used   Substance and Sexual Activity   . Alcohol use: Not Currently   . Drug use: Never   Social History Narrative    Right handed.        Review of Systems  Other than ROS in the  HPI, all other review of systems were negative except for: Respiratory: positive for dyspnea on  exertion  Cardiovascular: positive for chest pressure/discomfort (recent normal stress test)    Examination  BP (!) 163/83   Pulse 76   Temp 35.9 C (96.6 F) (Thermal Scan)   Ht 1.747 m (5' 8.78")   Wt 103 kg (226 lb 6.6 oz)   SpO2 97%   BMI 33.65 kg/m         Constitutional  General appearance: Normal  HENT:  Normal  Skin:  Normal,   Hem/Lymph:  Normal  Respiratory:  Normal.   Gastrointestinal:  Normal  Cardiovascular:    Carotid arteries: Normal  Musculoskeletal  Gait and Station: : Normal  Muscle strength (upper extremities): : Normal  Muscle strength (lower extremities): : Normal  Muscle tone (upper extremities): : Normal  Muscle tone (lower extremities): : Normal  Sensation: Normal  Deep tendon reflexes upper and lower extremities: Normal   Coordination: Normal  Neurological  Orientation: Normal  Recent and remote memory: Normal  Attention span and concentration: Normal  Language: Normal  Fund of knowledge: Normal  Cranial Nerves  2nd: Normal  3rd,4th,6th: Normal   5th: Normal  7th: Normal   8th: Normal   9th: Normal  11th: Normal  12th: Normal     Data reviewed  Synapse MRI brain wo contrast 05/14/19 (films/report reviewed): "74mm lesion within the sella": 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 1     THYROID STIMULATING HORMONE (SENSITIVE TSH)     THYROXINE, FREE (FREE T4)     PROLACTIN     TESTOSTERONE, FREE     TESTOSTERONE TOTAL     LH     FSH     MRI SELLA W/WO CONTRAST       Treatment Plan    The patient was seen as a shared visit with the co-signing faculty.    -The natural history, film findings, and indications for treatment were discussed.  -A panel of endocrine labs will be done on the way out of clinic today.  -RTC in 5 months with MRI sella w/wo contrast (which will be 1 year from the time of the last MRI), sooner  if problems develop.     -A copy of the note from today's clinic appointment will be sent via fax or mail to the patient's PCP and/or referring physician on file.       Sharman Crate, PA-C 06/11/2019, 15:05    With Dr. Bobby Rumpf     I personally saw and evaluated the patient. See mid-level's note for additional details. My findings/participation are questionable sellar mass with sub quality imaging.  Plan for repeat MRI and endocrine labs.    Dagmar Hait, MD

## 2019-06-12 LAB — ADRENOCORTICOTROPIC HORMONE: ACTH: 14.5 pg/mL (ref 7.0–69.0)

## 2019-06-14 LAB — TESTOSTERONE FREE (DIALYSIS) AND TOTAL,MS
TESTOSTERONE, FREE, NG/DL: 5.82 ng/dL (ref 3.67–13.9)
TESTOSTERONE, TOTAL, SERUM: 253 ng/dL (ref 240–950)

## 2019-06-15 LAB — INSULIN-LIKE GROWTH FACTOR 1, SERUM
IGF-1, LC/MS, S: 113 ng/mL (ref 34–232)
Z-SCORE: 0 SD (ref ?–2.0)

## 2019-06-20 ENCOUNTER — Ambulatory Visit (INDEPENDENT_AMBULATORY_CARE_PROVIDER_SITE_OTHER): Payer: Self-pay | Admitting: Family Medicine

## 2019-06-20 NOTE — Telephone Encounter (Signed)
Labs were last ordered in October. Unsure which labs patient is referring to. Phoned patient, no answer. Left message to return call

## 2019-06-20 NOTE — Telephone Encounter (Signed)
Regarding: Steve Young Pt \\ Orders were not sent to the hospital prior to Pt's arrival  ----- Message from [facility] sent at 06/20/2019  1:55 PM EST -----  Wynonia Hazard, MD    Pt calling in extremely upset because he had some blood work that was supposed to be sent to the [facility] hospital to be taken care of. He drove to "whatever the blue building is" and was told that they have received nothing for him. He would like a call back to discuss this and see why it was not sent over. He has already left the facility. Please call back and advise.    Thanks,  [employer]

## 2019-06-21 ENCOUNTER — Other Ambulatory Visit (INDEPENDENT_AMBULATORY_CARE_PROVIDER_SITE_OTHER): Payer: Self-pay | Admitting: NURSE PRACTITIONER

## 2019-06-21 NOTE — Telephone Encounter (Signed)
Last Visit:05/21/2019     Upcoming appointments: 07/09/2019           Erskine Speed, RN  06/21/2019, 09:39

## 2019-06-29 ENCOUNTER — Other Ambulatory Visit (INDEPENDENT_AMBULATORY_CARE_PROVIDER_SITE_OTHER): Payer: Self-pay | Admitting: Physician Assistant

## 2019-06-29 NOTE — Telephone Encounter (Signed)
Refill requested too soon.

## 2019-06-29 NOTE — Telephone Encounter (Signed)
Last Visit:05/21/2019     Upcoming appointments: 07/09/2019           Erskine Speed, RN  06/29/2019, 13:16

## 2019-07-03 ENCOUNTER — Other Ambulatory Visit (INDEPENDENT_AMBULATORY_CARE_PROVIDER_SITE_OTHER): Payer: Self-pay | Admitting: Internal Medicine

## 2019-07-03 NOTE — Telephone Encounter (Signed)
Regarding: refill request  ----- Message from Flonnie Hailstone II sent at 07/03/2019  1:08 PM EST -----  Wynonia Hazard, MD    Pt calling in requesting a refill of the below medication. Thank you.    zolpidem (AMBIEN) 10 mg Oral Tablet 21 Tab 0 06/11/2019    Sig: take 1 tablet by mouth at bedtime if needed   Sent to pharmacy as: zolpidem 10 mg tablet (AMBIEN)   Class: E-Rx   Non-formulary Exception Code: [id number] Formulary Info Available     Preferred Pharmacy     [facility], [place] - [place]    [place] [address]    Phone: [phone] Fax: [phone]    Hours: Not open 24 hours

## 2019-07-03 NOTE — Telephone Encounter (Signed)
Pt is requesting a refill on the pinned medication. Pt last appt 01/05/21next appt is 07/09/19.

## 2019-07-03 NOTE — Telephone Encounter (Signed)
Refill needs discussed at follow-up visit.

## 2019-07-05 ENCOUNTER — Other Ambulatory Visit (INDEPENDENT_AMBULATORY_CARE_PROVIDER_SITE_OTHER): Payer: Self-pay | Admitting: Physician Assistant

## 2019-07-06 NOTE — Telephone Encounter (Signed)
PDMP reviewed. Script sent.

## 2019-07-06 NOTE — Telephone Encounter (Signed)
Last Visit:05/21/2019     Upcoming appointments: 07/09/2019           Erskine Speed, RN  07/06/2019, 11:42

## 2019-07-07 ENCOUNTER — Other Ambulatory Visit (INDEPENDENT_AMBULATORY_CARE_PROVIDER_SITE_OTHER): Payer: Self-pay | Admitting: NURSE PRACTITIONER

## 2019-07-07 ENCOUNTER — Other Ambulatory Visit (INDEPENDENT_AMBULATORY_CARE_PROVIDER_SITE_OTHER): Payer: Self-pay | Admitting: Family Medicine

## 2019-07-07 DIAGNOSIS — J4541 Moderate persistent asthma with (acute) exacerbation: Secondary | ICD-10-CM

## 2019-07-09 ENCOUNTER — Encounter (INDEPENDENT_AMBULATORY_CARE_PROVIDER_SITE_OTHER): Payer: Self-pay | Admitting: NURSE PRACTITIONER

## 2019-07-09 ENCOUNTER — Encounter (INDEPENDENT_AMBULATORY_CARE_PROVIDER_SITE_OTHER): Payer: Self-pay | Admitting: Family Medicine

## 2019-07-09 ENCOUNTER — Other Ambulatory Visit: Payer: Self-pay

## 2019-07-09 ENCOUNTER — Ambulatory Visit (INDEPENDENT_AMBULATORY_CARE_PROVIDER_SITE_OTHER): Payer: Medicare Other | Admitting: NURSE PRACTITIONER

## 2019-07-09 VITALS — BP 124/86 | HR 60 | Temp 96.9°F | Ht 68.0 in | Wt 226.0 lb

## 2019-07-09 DIAGNOSIS — R7303 Prediabetes: Secondary | ICD-10-CM

## 2019-07-09 DIAGNOSIS — E785 Hyperlipidemia, unspecified: Secondary | ICD-10-CM

## 2019-07-09 DIAGNOSIS — R040 Epistaxis: Secondary | ICD-10-CM

## 2019-07-09 DIAGNOSIS — M170 Bilateral primary osteoarthritis of knee: Secondary | ICD-10-CM

## 2019-07-09 DIAGNOSIS — K2960 Other gastritis without bleeding: Secondary | ICD-10-CM

## 2019-07-09 DIAGNOSIS — Z6834 Body mass index (BMI) 34.0-34.9, adult: Secondary | ICD-10-CM

## 2019-07-09 DIAGNOSIS — F5101 Primary insomnia: Secondary | ICD-10-CM

## 2019-07-09 DIAGNOSIS — E669 Obesity, unspecified: Secondary | ICD-10-CM

## 2019-07-09 DIAGNOSIS — J4541 Moderate persistent asthma with (acute) exacerbation: Secondary | ICD-10-CM

## 2019-07-09 DIAGNOSIS — I10 Essential (primary) hypertension: Secondary | ICD-10-CM

## 2019-07-09 DIAGNOSIS — G939 Disorder of brain, unspecified: Secondary | ICD-10-CM

## 2019-07-09 MED ORDER — DICLOFENAC 1 % TOPICAL GEL
Freq: Four times a day (QID) | CUTANEOUS | 3 refills | Status: DC
Start: 2019-07-09 — End: 2023-01-10

## 2019-07-09 NOTE — Telephone Encounter (Signed)
Last Visit:05/21/2019     Upcoming appointments: 07/09/2019        Erskine Speed, RN  07/09/2019, 08:42

## 2019-07-09 NOTE — Telephone Encounter (Signed)
Last Visit:05/21/2019     Upcoming appointments: 07/09/2019           Erskine Speed, RN  07/09/2019, 08:42

## 2019-07-09 NOTE — Nursing Note (Signed)
Pt in office for follow up and med refills, pt unable to verify all meds but asked for them all to be refilled.

## 2019-07-10 ENCOUNTER — Other Ambulatory Visit (INDEPENDENT_AMBULATORY_CARE_PROVIDER_SITE_OTHER): Payer: Self-pay | Admitting: Family Medicine

## 2019-07-10 NOTE — Progress Notes (Signed)
General Exam    Subjective:    Steve Young is an 59 y.o. male who is here for a routine follow up.  Medical problems include hypertension, osteoarthritis, reflux gastritis, hyperlipidemia, prediabetes, asthma, and insomnia.  Hypertension is currently controlled with losartan, chlorthalidone, and amlodipine.  Blood pressure in the office today is 124/86.  He reports compliance with medication.  Denies headaches, dizziness, visual changes, chest pain, shortness of breath, or edema.  He has osteoarthritis of both of his knees and follows with orthopedics.  He does see pain management with fair control pain.  He takes Prilosec 40 mg once daily for GERD with good control.  He is on Lipitor 80 mg as well as Lovaza for hyperlipidemia.  His most recent lipid panels from March 05, 2019 and his triglycerides were 155, cholesterol 119, HDL 33, and LDL 55. He is prediabetic however, he has been non compliant with having recent blood work completed.  There is no recent A1c for review.  Denies polyuria, polyphagia, or polydipsia.  He tells me he does not really pay much attention to the amount or types of foods he eats.  He has asthma and uses albuterol as well as Symbicort inhalers with good control.  He denies increased sputum production, or shortness of breath, or recent infections.  She reports following social distancing guidelines and mask wearing when in public.  He has am some knee a which is well controlled with Ambien.  He says Ambien is the only thing that controls his insomnia.  At his previous appointment he was noted to have a brain lesion and was referred to neurosurgery.  He did see Neurosurgery at [facility] on June 11, 2019. Neurosurgery ordered additional blood work on the patient and will do a repeat MRI in 5 months.  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  Today, the patient tells me he has been having frequent nose bleeds.  He says this has been going  on for quite some time.  He says it just happens at any time throughout the day or night.  Sometimes it happens once a week and sometimes he has up to 3 nosebleed today.  He says he can usually get them stopped by holding pressure fairly quickly.    No Known Allergies    Current Outpatient Medications:   .  albuterol sulfate (PROVENTIL OR VENTOLIN OR PROAIR) 90 mcg/actuation Inhalation HFA Aerosol Inhaler, inhale 1 puff by mouth every 4 hours if needed wheezing, Disp: 18 g, Rfl: 1  .  amLODIPine (NORVASC) 10 mg Oral Tablet, take 1 tablet by mouth once daily, Disp: 30 Each, Rfl: 0  .  aspirin (ECOTRIN) 81 mg Oral Tablet, Delayed Release (E.C.), take 1 tablet by mouth once daily, Disp: 90 Tab, Rfl: 3  .  atorvastatin (LIPITOR) 80 mg Oral Tablet, take 1 tablet by mouth at bedtime, Disp: 90 Tab, Rfl: 3  .  chlorthalidone (HYGROTON) 25 mg Oral Tablet, take 1 tablet by mouth once daily, Disp: 30 Each, Rfl: 1  .  cholecalciferol, vitamin D3, 125 mcg (5,000 unit) Oral Capsule, take 1 capsule by mouth once daily, Disp: 90 Cap, Rfl: 1  .  diclofenac sodium (VOLTAREN) 1 % Gel, by Apply Topically route Four times a day - before meals and bedtime, Disp: 20 g, Rfl: 3  .  folic acid (FOLVITE) 1 mg Oral Tablet, Take 1 Tab (1 mg total) by mouth Once a day, Disp: 90  Tab, Rfl: 3  .  gabapentin (NEURONTIN) 600 mg Oral Tablet, take 1 tablet by mouth three times a day, Disp: 90 Tablet, Rfl: 0  .  Ibuprofen (MOTRIN) 800 mg Oral Tablet, Take 1 Tab (800 mg total) by mouth Three times a day as needed for Pain (with food), Disp: 90 Tab, Rfl: 1  .  losartan (COZAAR) 100 mg Oral Tablet, take 1 tablet by mouth once daily, Disp: 30 Each, Rfl: 0  .  lubiprostone (AMITIZA) 24 mcg Oral Capsule, Take 1 Cap (24 mcg total) by mouth Twice daily, Disp: 60 Cap, Rfl: 3  .  magnesium oxide (MAG-OX) 400 mg (241.3 mg magnesium) Oral Tablet, Take 400 mg by mouth, Disp: , Rfl:   .  methocarbamol (ROBAXIN) 500 mg Oral Tablet, , Disp: , Rfl:   .  NARCAN 4  mg/actuation Nasal Spray, Non-Aerosol, INSTILL 1 SPRAY NASALLY AS NEEDED PER CDC RECOMMENDATIONS, Disp: , Rfl:   .  omega-3 fatty acid (LOVAZA) 1 gram Oral Capsule, take 2 capsules by mouth twice a day, Disp: 360 Cap, Rfl: 1  .  omeprazole (PRILOSEC) 40 mg Oral Capsule, Delayed Release(E.C.), Take 1 Cap (40 mg total) by mouth Once a day, Disp: 30 Cap, Rfl: 2  .  oxyCODONE-acetaminophen (PERCOCET) 10-325 mg Oral Tablet, take 1 tablet by mouth three times a day if needed for pain, Disp: , Rfl: 0  .  PARoxetine (PAXIL) 10 mg Oral Tablet, take 1 tablet by mouth once daily, Disp: 30 Tab, Rfl: 2  .  Sildenafil (VIAGRA) 25 mg Oral Tablet, take 1 tablet by mouth every 24 hours if needed, Disp: 10 Tab, Rfl: 1  .  SYMBICORT 160-4.5 mcg/actuation Inhalation HFA Aerosol Inhaler, inhale 2 puffs by mouth twice a day, Disp: 10.2 g, Rfl: 2  .  zafirlukast (ACCOLATE) 20 mg Oral Tablet, take 1 tablet by mouth twice a day before meals, Disp: 60 Tablet, Rfl: 0  .  zolpidem (AMBIEN) 10 mg Oral Tablet, take 1 tablet by mouth at bedtime if needed, Disp: 21 Tablet, Rfl: 0  Past Medical History:   Diagnosis Date   . Arthralgia of hip 03/21/2018   . Arthralgia of right upper arm 03/30/2016   . Asthma    . Cervicalgia 03/21/2018   . Chronic abdominal pain 10/04/2018   . Chronic back pain 03/21/2018   . Chronic chest pain 03/21/2018   . Chronic insomnia    . Constipation in male 09/13/2017   . Esophageal reflux    . 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 Hepatic lesion 10/04/2018   . Hyperlipidemia    . Hypertension    . Impingement syndrome of left shoulder 12/28/2017   . Mild persistent asthma without complication 0000000   . Normal cardiac stress test 03/21/2018   . Obesity    . 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 Partial tear of common extensor tendon of elbow 12/13/2017   . Pre-diabetes    . Rib pain 10/04/2018   . Sexual dysfunction    . Tear of talofibular ligament 05/30/2013    RIGHT   . Vitamin D deficiency          Past  Surgical History:   Procedure Laterality Date   . COLONOSCOPY     . ELBOW SURGERY Left 12/2017   . HX OTHER  2004     SPINAL FUSION,ANT,EA ADNL LEVEL c3-c7   . HX OTHER Right     right great toe   . HX  SHOULDER SURGERY Bilateral    . NOSE SURGERY       Social History     Socioeconomic History   . Marital status: Divorced     Spouse name: Not on file   . Number of children: 1   . Years of education: Not on file   . Highest education level: Not on file   Occupational History   . 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 Smoking status: Never Smoker   . Smokeless tobacco: Never Used   Vaping Use   . Vaping Use: Never used   Substance and Sexual Activity   . Alcohol use: Not Currently   . Drug use: Never   Social History Narrative    Right handed.      Social Determinants of Health     Financial Resource Strain:    . Difficulty of Paying Living Expenses:    Food Insecurity:    . Worried About Charity fundraiser in the Last Year:    . Arboriculturist in the Last Year:    Transportation Needs:    . Film/video editor (Medical):    Marland Kitchen Lack of Transportation (Non-Medical):    Physical Activity:    . Days of Exercise per Week:    . Minutes of Exercise per Session:    Stress:    . Feeling of Stress :    Intimate Partner Violence:    . Fear of Current or Ex-Partner:    . Emotionally Abused:    Marland Kitchen Physically Abused:    . Sexually Abused:        ROS:  General:  Denies significant weight change, fevers, chills, fatigue. + sleep difficulty, controlled with Ambien.  Eyes:  Denies visual changes.  HENT:  Denies nasal congestion, hearing loss, ear pain, sore throat. + frequent nose bleeds.   Neck:  Denies neck fullness, pain.  Heart:  Denies chest pain, palpitations.  Lungs:  Denies SOB, pleuritic pain, cough.  Gastrointestinal: Denies any abd pain, N/V/D, constipation.  Genitourinary: Denies any dysuria or hematuria  Musculoskeletal: + bilateral knee pain.    Neurologic: Denies headaches, numbness,  tingling.  Integumentary : Denies any rashes, lesions, wounds.    Otherwise all other review of systems are negative    Objective:    Physical Exam:  Vitals:    07/09/19 1302   BP: 124/86   Pulse: 60   Temp: 36.1 C (96.9 F)   TempSrc: Thermal Scan   SpO2: 97%   Weight: 103 kg (226 lb)   Height: 1.727 m (5\' 8" )   BMI: 34.44       General:  Well-appearing in no acute distress.  Skin:  Warm, dry, normal.  Without rash, lesion.  Eyes:  Pupils are reactive to light.  EOM intact.  Conjunctiva pink.  No scleral icterus.  HENT:  Gross hearing intact.  Ear canals without cerumen impaction.  Tympanic membranes without erythema, effusion.  No pharyngeal lesions, exudate.  Tongue midline.  Neck:  Supple.  No JVD, thyromegaly, lymphadenopathy.  No carotid bruits.  Heart:  RRR.  No murmur, heave, lift, thrill.  Heart sounds are normal.  No S3, S4 gallop.  Lungs:  Lungs are clear to auscultation bilaterally.  No wheezes, crackles, rubs.  Abdomen:  Nontender.  No hepatomegaly, masses, bruits.  Bowel sounds are normal.  Musculoskeletal:  Back is nontender to palpation.  No clubbing, cyanosis, edema.  Neuro:  Alert, oriented, cooperative.  Gait is stable.  No focal findings.        Assessment and Plan:  Orders Placed This Encounter   . COMPREHENSIVE METABOLIC PNL, FASTING   . LIPID PANEL   . CBC   . Refer to [facility]ol[facility]ology[facility]   . diclofenac sodium (VOLTAREN) 1 % Gel     (I10) Essential hypertension  (primary encounter diagnosis)  Plan: LIPID PANEL, CBC        Stable, patient advised to continue current medication as prescribed.  Hypertension treatment plan  Patient advised to make sure blood pressure is under control at least less than 140/90 if possible and call the office if higher.  Take blood pressure medicine on a daily basis as prescribed.  Check blood pressure at home or at the pharmacy on a regular basis and call with readings. Aim for healthy weight with lifestyle modifications through heart healthy diet and  activity.  Discussed activity such as 30-45 minutes of daily walking day. Eat food low in sodium and salt with no more than 2.4 g of sodium daily. Read nutrition labels and stay away from canned soups, canned vegetables, potato chips pickles, lunch meat and TV dinners.  Advised to season food with seasoning alternative such as Mrs.DASH.  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    (M17.0) Primary osteoarthritis of both knees  Plan: COMPREHENSIVE METABOLIC PNL, FASTING, CBC        Advised to continue following pain Management and Orthopedics as scheduled.    (K29.60) Reflux gastritis  Plan: CBC        Stable, advised to continue Prilosec as ordered and avoid trigger foods.    (E78.5) Hyperlipidemia LDL goal <130  Plan: LIPID PANEL, CBC        Patient advised of blood work completed as ordered.  Continue Lovaza and Lipitor as prescribed.    (R73.03) Prediabetes  Plan:  Discussed low-carbohydrate diet with patient.  Patient advised of blood work completed including hemoglobin A1c is ordered.    (J45.41) Moderate persistent asthma with acute exacerbation  Plan:  Stable, patient advised to continue with use of inhaler as prescribed.  Advised to continue following social distancing guidelines and mask wearing when in public.    (0000000) Primary insomnia  Plan: CBC        Stable, patient advised continue with Ambien as prescribed.    (R04.0) Epistaxis  Plan: CBC, Refer to [facility]ol[facility]ology[facility]        Referral sent for ENT for further evaluation.    (E66.9) Obesity (BMI 30.0-34.9)  Plan:  Patient advised his BMI places him in the obesity category.  Discussed weight reduction through lifestyle modifications with diet and exercise.      Follow Up:  Return for routine follow-up appointment as scheduled with Dr. Carlisle Cater or if symptoms worsen or fail to improve.  Patient instructed to call the office with questions or  concerns in the interim.    This dictation was transcribed using voice recognition software and may contain minor typographical errors.

## 2019-07-12 NOTE — Telephone Encounter (Signed)
Last Visit:07/09/2019     Upcoming appointments: 10/08/2019        Erskine Speed, RN  07/12/2019, 10:33

## 2019-07-13 ENCOUNTER — Other Ambulatory Visit (INDEPENDENT_AMBULATORY_CARE_PROVIDER_SITE_OTHER): Payer: Self-pay | Admitting: NURSE PRACTITIONER

## 2019-07-13 NOTE — Telephone Encounter (Signed)
Last Visit:07/09/2019     Upcoming appointments: 10/08/2019          Erskine Speed, RN  07/13/2019, 16:04

## 2019-07-23 ENCOUNTER — Other Ambulatory Visit (INDEPENDENT_AMBULATORY_CARE_PROVIDER_SITE_OTHER): Payer: Self-pay | Admitting: Physician Assistant

## 2019-07-26 ENCOUNTER — Other Ambulatory Visit (INDEPENDENT_AMBULATORY_CARE_PROVIDER_SITE_OTHER): Payer: Self-pay | Admitting: Physician Assistant

## 2019-07-26 ENCOUNTER — Other Ambulatory Visit (INDEPENDENT_AMBULATORY_CARE_PROVIDER_SITE_OTHER): Payer: Self-pay | Admitting: NURSE PRACTITIONER

## 2019-07-26 NOTE — Telephone Encounter (Signed)
Requested refill too soon

## 2019-07-26 NOTE — Telephone Encounter (Signed)
Last Visit:07/09/2019     Upcoming appointments: 10/08/2019           Erskine Speed, RN  07/26/2019, 15:02

## 2019-07-26 NOTE — Telephone Encounter (Signed)
Regarding: [facility]RX refill  ----- Message from De Hollingshead sent at 07/26/2019  1:36 PM EST -----  Wynonia Hazard, MD    Pt requesting the following medication refill    zolpidem (AMBIEN) 10 mg Oral Tablet    Preferred Pharmacy       [facility], [place]    [place] [address]    Phone: [phone] Fax: [phone]    Hours: Not open 24 hours     Thanks    Danielle`

## 2019-07-27 ENCOUNTER — Ambulatory Visit (INDEPENDENT_AMBULATORY_CARE_PROVIDER_SITE_OTHER): Payer: Self-pay | Admitting: Otolaryngology

## 2019-07-27 NOTE — Telephone Encounter (Signed)
Requested refill too soon

## 2019-07-27 NOTE — Telephone Encounter (Signed)
Last Visit:07/09/2019     Upcoming appointments: 10/08/2019           Erskine Speed, RN  07/27/2019, 14:29    Patient requesting refill on this med. He states he has to take it every night. Last filled 07/06/19 qty 21

## 2019-07-29 ENCOUNTER — Other Ambulatory Visit (INDEPENDENT_AMBULATORY_CARE_PROVIDER_SITE_OTHER): Payer: Self-pay | Admitting: NURSE PRACTITIONER

## 2019-07-29 DIAGNOSIS — M170 Bilateral primary osteoarthritis of knee: Secondary | ICD-10-CM

## 2019-07-29 DIAGNOSIS — G8929 Other chronic pain: Secondary | ICD-10-CM

## 2019-07-30 ENCOUNTER — Other Ambulatory Visit (INDEPENDENT_AMBULATORY_CARE_PROVIDER_SITE_OTHER): Payer: Self-pay | Admitting: NURSE PRACTITIONER

## 2019-07-30 MED ORDER — ZOLPIDEM 10 MG TABLET
ORAL_TABLET | ORAL | 0 refills | Status: DC
Start: 2019-07-30 — End: 2019-08-21

## 2019-07-30 NOTE — Telephone Encounter (Signed)
Regarding: [facility]//RX refill  ----- Message from Reita Chard sent at 07/30/2019 11:44 AM EDT -----  Wynonia Hazard, MD     Pt is calling regarding the below message     Pt was very upset, he states he has been out of this, and has been requesting a refill since 3.11.2021     He is wanting this sent in     Please advise   Thank you     ----- Message from De Hollingshead sent at 07/26/2019  1:36 PM EST -----  Wynonia Hazard, MD    Pt requesting the following medication refill    zolpidem (AMBIEN) 10 mg Oral Tablet    Preferred Pharmacy       [facility], [place] [address]    [place] [address]    Phone: [phone] Fax: [phone]    Hours: Not open 24 hours     Thanks    Danielle`

## 2019-07-30 NOTE — Telephone Encounter (Signed)
Please advise this patient that this medication is a controlled substance and cannot be filled early.  He is not due to have the medication filled until 08/03/2019 and she should not be out of it.  I will send the refill but he will not be able to have the medication refilled until 08/03/2019.  Thank you.

## 2019-07-30 NOTE — Telephone Encounter (Signed)
Please see note below. Last filled 07/06/19 for a 21 day supply      Last Visit:07/09/2019     Upcoming appointments: 10/08/2019           Erskine Speed, RN  07/30/2019, 11:55

## 2019-08-01 NOTE — Telephone Encounter (Signed)
Last Visit:07/09/2019     Upcoming appointments: 10/08/2019           Erskine Speed, RN  08/01/2019, 08:57

## 2019-08-03 ENCOUNTER — Other Ambulatory Visit (INDEPENDENT_AMBULATORY_CARE_PROVIDER_SITE_OTHER): Payer: Self-pay | Admitting: NURSE PRACTITIONER

## 2019-08-03 DIAGNOSIS — J4541 Moderate persistent asthma with (acute) exacerbation: Secondary | ICD-10-CM

## 2019-08-03 NOTE — Telephone Encounter (Signed)
Last Visit:07/09/2019     Upcoming appointments: 10/08/2019        Erskine Speed, RN  08/03/2019, 12:59

## 2019-08-05 ENCOUNTER — Other Ambulatory Visit (INDEPENDENT_AMBULATORY_CARE_PROVIDER_SITE_OTHER): Payer: Self-pay | Admitting: NURSE PRACTITIONER

## 2019-08-06 ENCOUNTER — Ambulatory Visit (INDEPENDENT_AMBULATORY_CARE_PROVIDER_SITE_OTHER): Payer: Self-pay | Admitting: Otolaryngology

## 2019-08-06 NOTE — Telephone Encounter (Signed)
Last Visit:07/09/2019     Upcoming appointments: 10/08/2019           Erskine Speed, RN  08/06/2019, 09:33

## 2019-08-08 ENCOUNTER — Other Ambulatory Visit (INDEPENDENT_AMBULATORY_CARE_PROVIDER_SITE_OTHER): Payer: Self-pay | Admitting: NURSE PRACTITIONER

## 2019-08-08 NOTE — Telephone Encounter (Signed)
Last Visit:07/09/2019     Upcoming appointments: 10/08/2019           Erskine Speed, RN  08/08/2019, 14:55

## 2019-08-13 ENCOUNTER — Encounter (INDEPENDENT_AMBULATORY_CARE_PROVIDER_SITE_OTHER): Payer: Self-pay | Admitting: Otolaryngology

## 2019-08-13 DIAGNOSIS — D497 Neoplasm of unspecified behavior of endocrine glands and other parts of nervous system: Secondary | ICD-10-CM | POA: Insufficient documentation

## 2019-08-16 LAB — HGA1C (HEMOGLOBIN A1C WITH EST AVG GLUCOSE): HEMOGLOBIN A1C: 5.7

## 2019-08-16 LAB — GLUCOSE: GLUCOSE: 118

## 2019-08-16 LAB — CBC
HCT: 46.2
HGB: 15.2
RBC: 5.41

## 2019-08-16 LAB — TRIGLYCERIDE: TRIGLYCERIDES: 105

## 2019-08-17 ENCOUNTER — Other Ambulatory Visit (INDEPENDENT_AMBULATORY_CARE_PROVIDER_SITE_OTHER): Payer: Self-pay | Admitting: NURSE PRACTITIONER

## 2019-08-17 ENCOUNTER — Telehealth (INDEPENDENT_AMBULATORY_CARE_PROVIDER_SITE_OTHER): Payer: Self-pay | Admitting: NURSE PRACTITIONER

## 2019-08-17 ENCOUNTER — Encounter (INDEPENDENT_AMBULATORY_CARE_PROVIDER_SITE_OTHER): Payer: Self-pay | Admitting: Family Medicine

## 2019-08-17 DIAGNOSIS — F5101 Primary insomnia: Secondary | ICD-10-CM

## 2019-08-17 DIAGNOSIS — R7303 Prediabetes: Secondary | ICD-10-CM

## 2019-08-17 DIAGNOSIS — E785 Hyperlipidemia, unspecified: Secondary | ICD-10-CM

## 2019-08-17 DIAGNOSIS — I10 Essential (primary) hypertension: Secondary | ICD-10-CM

## 2019-08-17 DIAGNOSIS — R040 Epistaxis: Secondary | ICD-10-CM

## 2019-08-17 DIAGNOSIS — M170 Bilateral primary osteoarthritis of knee: Secondary | ICD-10-CM

## 2019-08-17 DIAGNOSIS — K2960 Other gastritis without bleeding: Secondary | ICD-10-CM

## 2019-08-17 NOTE — Telephone Encounter (Signed)
Last Visit:07/09/2019     Upcoming appointments: 10/08/2019           Erskine Speed, RN  08/17/2019, 12:24

## 2019-08-20 ENCOUNTER — Ambulatory Visit (INDEPENDENT_AMBULATORY_CARE_PROVIDER_SITE_OTHER): Payer: Medicare Other | Admitting: Otolaryngology

## 2019-08-20 ENCOUNTER — Other Ambulatory Visit (INDEPENDENT_AMBULATORY_CARE_PROVIDER_SITE_OTHER): Payer: Self-pay | Admitting: NURSE PRACTITIONER

## 2019-08-20 ENCOUNTER — Other Ambulatory Visit: Payer: Self-pay

## 2019-08-20 ENCOUNTER — Encounter (INDEPENDENT_AMBULATORY_CARE_PROVIDER_SITE_OTHER): Payer: Self-pay | Admitting: Otolaryngology

## 2019-08-20 VITALS — BP 129/74 | HR 61 | Temp 97.5°F | Ht 69.0 in | Wt 237.2 lb

## 2019-08-20 DIAGNOSIS — R040 Epistaxis: Secondary | ICD-10-CM

## 2019-08-20 DIAGNOSIS — J3489 Other specified disorders of nose and nasal sinuses: Secondary | ICD-10-CM

## 2019-08-20 MED ORDER — MUPIROCIN 2 % TOPICAL OINTMENT
TOPICAL_OINTMENT | Freq: Three times a day (TID) | CUTANEOUS | 3 refills | Status: DC
Start: 2019-08-20 — End: 2022-05-03

## 2019-08-20 NOTE — Progress Notes (Addendum)
[facility], [facility]  [place] [id number]  [phone]    PATIENT NAME:  Steve Young  MRN:  [id number]  DOB:  September 02, 1960  DATE OF SERVICE: 08/20/2019    Chief Complaint:  Nosebleeds and Ear Bleeding (both ears)    HPI:  Zakari Sprau is a 59 y.o. male who is an established patient that has been seen in clinic within the last 3 years. Johny Sax presents today for evaluation of epistaxis and bloody otorrhea. He states epistaxis has been happening more recently. No other complaints. He did have FESS 10 years ago and states this did resolve his bilateral sinusitis. He overall feels stable.    Review of Systems:                                                        All other systems reviewed and found to be negative except as per HPI.  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    Hepatic lesion 10/04/2018    Hyperlipidemia     Hypertension     Impingement syndrome of left shoulder 12/28/2017    Mild persistent asthma without complication 0000000    Normal cardiac stress test 03/21/2018    Nosebleed     Obesity     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    Partial tear of common extensor tendon of elbow 12/13/2017    Pituitary tumor     Pre-diabetes     Rib pain 10/04/2018    Sexual dysfunction     Tear of talofibular ligament 05/30/2013    RIGHT    Vitamin D deficiency          Past Surgical History:  Past Surgical History:   Procedure Laterality Date    COLONOSCOPY      ELBOW SURGERY Left 12/2017    HX OTHER  2004     SPINAL FUSION,ANT,EA ADNL LEVEL c3-c7    HX OTHER Right     right great toe     HX SHOULDER SURGERY Bilateral     NOSE SURGERY           Family History:  Family Medical History:     Problem Relation (Age of Onset)    Cancer Mother, Sister            Social History:  Social History     Tobacco Use   Smoking Status Never Smoker   Smokeless Tobacco Never Used     Social History     Substance and Sexual Activity   Alcohol Use Not Currently     Social History     Occupational History    Occupation: retired: ship yards, drove Surveyor, quantity        Medications:  Outpatient Medications Marked as Taking for the 08/20/19 encounter (Office Visit) with Concha Norway, MD   Medication Sig  amLODIPine (NORVASC) 10 mg Oral Tablet take 1 tablet by mouth once daily    aspirin (ECOTRIN) 81 mg Oral Tablet, Delayed Release (E.C.) take 1 tablet by mouth once daily    atorvastatin (LIPITOR) 80 mg Oral Tablet take 1 tablet by mouth at bedtime    chlorthalidone (HYGROTON) 25 mg Oral Tablet take 1 tablet by mouth once daily    cholecalciferol, vitamin D3, 125 mcg (5,000 unit) Oral Capsule take 1 capsule by mouth once daily    diclofenac sodium (VOLTAREN) 1 % Gel by Apply Topically route Four times a day - before meals and bedtime    folic acid (FOLVITE) 1 mg Oral Tablet Take 1 Tab (1 mg total) by mouth Once a day    gabapentin (NEURONTIN) 600 mg Oral Tablet take 1 tablet by mouth three times a day    Ibuprofen (MOTRIN) 800 mg Oral Tablet take 1 tablet by mouth three times a day if needed for pain take with food    losartan (COZAAR) 100 mg Oral Tablet take 1 tablet by mouth once daily    lubiprostone (AMITIZA) 24 mcg Oral Capsule Take 1 Cap (24 mcg total) by mouth Twice daily    magnesium oxide (MAG-OX) 400 mg (241.3 mg magnesium) Oral Tablet Take 400 mg by mouth    methocarbamol (ROBAXIN) 500 mg Oral Tablet     mupirocin (BACTROBAN) 2 % Ointment by Apply Topically route Three times a day    omega-3 fatty acid (LOVAZA) 1 gram Oral Capsule take 2 capsules by mouth twice a day    omeprazole  (PRILOSEC) 40 mg Oral Capsule, Delayed Release(E.C.) take 1 capsule by mouth once daily    oxyCODONE-acetaminophen (PERCOCET) 10-325 mg Oral Tablet take 1 tablet by mouth three times a day if needed for pain    PARoxetine (PAXIL) 10 mg Oral Tablet take 1 tablet by mouth once daily    Sildenafil (VIAGRA) 25 mg Oral Tablet take 1 tablet by mouth every 24 hours if needed    SYMBICORT 160-4.5 mcg/actuation Inhalation HFA Aerosol Inhaler inhale 2 puffs by mouth twice a day    zafirlukast (ACCOLATE) 20 mg Oral Tablet take 1 tablet by mouth twice a day before meals    zolpidem (AMBIEN) 10 mg Oral Tablet take 1 tablet by mouth at bedtime if needed  Do not fill until 08/03/2019 or later.       Allergies:  No Known Allergies        Physical Exam:  Blood pressure 129/74, pulse 61, temperature 36.4 C (97.5 F), height 1.753 m (5\' 9" ), weight 108 kg (237 lb 3.2 oz).  Body mass index is 35.03 kg/m.  General Appearance: Pleasant, cooperative, healthy, and in no acute distress.  Eyes: Conjunctivae/corneas clear, PERRLA, EOM's intact.  Head and Face: Normocephalic, atraumatic.  Face symmetric, no obvious lesions.   Right Ear:       Pinnae: Normal shape and position.        External auditory canals:  Patent without inflammation.       Tympanic membranes:  Intact, translucent, midposition, middle ear aerated.  Left Ear:       Pinnae: Normal shape and position.        External auditory canals:  Patent without inflammation.       Tympanic membranes:  Intact, translucent, midposition, middle ear aerated.  Nose: External pyramid midline. Mucosa normal. No purulence, polyps, or crusts.   Oral Cavity/Oropharynx: No mucosal lesions, masses, or pharyngeal asymmetry.  Tonsils: Deferred.  Nasopharynx: Deferred.  Hypopharynx/Larynx: Deferred.  Neck: no cervical adenopathy, no palpable thyroid or salivary gland masses  Thyroid: no significant thyroid abnormality by palpation  Cardiovascular: Good perfusion of upper extremities.  No  cyanosis of the hands or fingers.  Lungs: No apparent stridorous breathing. No acute distress.  Skin: Skin warm and dry.  Neurologic: Cranial nerves: grossly intact.  Psychiatric: Alert and oriented x 3.      Procedure:  Rigid nasal endoscopy  Pre-operative Dx: Epistaxis  Post-operative Dx: same    Description: bilateral side(s) of the nose anesthetized with Afrin/Pontocaine.  A 30-degree rigid endoscope was passed into the nose with the following findings: Bilateral middle meatuses with surgical changes. No polyps. No infection.  The patient tolerated the procedure well.      I am scribing for, and in the presence of, Dr. Whitney Post for services provided on 08/20/2019.  Roxana Hires, SCRIBE  Roxana Hires, SCRIBE,  08/20/2019, 09:23.      I personally performed the services described in this documentation, as scribed  in my presence, and it is both accurate  and complete.    Concha Norway, MD        Data Reviewed:  N/A    Assessment:  1. Epistaxis, worsening.  2. Nasal crusting, worsening.    Plan:  Orders Placed This Encounter    mupirocin (BACTROBAN) 2 % Ointment     1. Nasal endoscopy performed today in clinic - clear.  2. Prescribed Bactroban ointment for nasal crusting and epistaxis prevention.   3. F/U in 3 weeks for reevaluation.    Concha Norway, MD  Department of Otolaryngology  [facility]    PCP: Wynonia Hazard, MD  [address]  [place] [address]   REF: [address], [place] [address]     I am scribing for, and in the presence of, Dr. Whitney Post for services provided on 08/20/2019.  Roxana Hires, SCRIBE  Roxana Hires, Rabun  08/20/2019, 09:48    I personally performed the services described in this documentation, as scribed  in my presence, and it is both accurate  and complete.    Concha Norway, MD

## 2019-08-20 NOTE — Procedures (Signed)
Rigid nasal endoscopy  Pre-operative Dx: Epistaxis  Post-operative Dx: same    Description: bilateral side(s) of the nose anesthetized with Afrin/Pontocaine.  A 30-degree rigid endoscope was passed into the nose with the following findings: Bilateral middle meatuses with surgical changes. No polyps. No infection.  The patient tolerated the procedure well.      I am scribing for, and in the presence of, Dr. Whitney Post for services provided on 08/20/2019.  Roxana Hires, SCRIBE  Roxana Hires, SCRIBE,  08/20/2019, 09:23.      I personally performed the services described in this documentation, as scribed  in my presence, and it is both accurate  and complete.    Concha Norway, MD

## 2019-08-21 ENCOUNTER — Encounter (INDEPENDENT_AMBULATORY_CARE_PROVIDER_SITE_OTHER): Payer: Self-pay | Admitting: Family Medicine

## 2019-08-21 ENCOUNTER — Ambulatory Visit (INDEPENDENT_AMBULATORY_CARE_PROVIDER_SITE_OTHER): Payer: Self-pay | Admitting: NURSE PRACTITIONER

## 2019-08-21 ENCOUNTER — Telehealth (INDEPENDENT_AMBULATORY_CARE_PROVIDER_SITE_OTHER): Payer: Self-pay | Admitting: NURSE PRACTITIONER

## 2019-08-21 DIAGNOSIS — R7303 Prediabetes: Secondary | ICD-10-CM

## 2019-08-21 NOTE — Telephone Encounter (Signed)
Last Visit:07/09/2019     Upcoming appointments: 10/08/2019           Erskine Speed, RN  08/21/2019, 16:00

## 2019-08-21 NOTE — Telephone Encounter (Signed)
Called patient back and he is aware that per [facility] he is pre-diabetic and to watch low carbohydrate diet and sugar intake and exercise.

## 2019-08-21 NOTE — Telephone Encounter (Signed)
Regarding: [facility]rezpt// call back request  ----- Message from De Hollingshead sent at 08/21/2019  4:51 PM EDT -----  Wynonia Hazard, MD    Pt is requesting a return call as he states he missed a call from [place].    Thanks    [employer]

## 2019-08-21 NOTE — Telephone Encounter (Signed)
Left message on machine to call back regarding lab results.

## 2019-08-21 NOTE — Telephone Encounter (Signed)
Please let patient know he is pre-diabetic.  I advise lifestyle modifications with lower carbohydrate diet and sugar intake and exercise.  Thank you.

## 2019-08-22 ENCOUNTER — Encounter (INDEPENDENT_AMBULATORY_CARE_PROVIDER_SITE_OTHER): Payer: Self-pay | Admitting: Family Medicine

## 2019-08-24 ENCOUNTER — Other Ambulatory Visit (INDEPENDENT_AMBULATORY_CARE_PROVIDER_SITE_OTHER): Payer: Self-pay | Admitting: Physician Assistant

## 2019-08-24 DIAGNOSIS — J4541 Moderate persistent asthma with (acute) exacerbation: Secondary | ICD-10-CM

## 2019-08-24 NOTE — Telephone Encounter (Signed)
Last Visit:07/09/2019     Upcoming appointments: 10/08/2019        Erskine Speed, RN  08/24/2019, 11:04

## 2019-08-28 ENCOUNTER — Other Ambulatory Visit (INDEPENDENT_AMBULATORY_CARE_PROVIDER_SITE_OTHER): Payer: Self-pay | Admitting: NURSE PRACTITIONER

## 2019-08-30 NOTE — Telephone Encounter (Signed)
Last Visit:07/09/2019     Upcoming appointments: 10/08/2019          Erskine Speed, RN  08/30/2019, 09:42

## 2019-09-02 ENCOUNTER — Other Ambulatory Visit (INDEPENDENT_AMBULATORY_CARE_PROVIDER_SITE_OTHER): Payer: Self-pay | Admitting: NURSE PRACTITIONER

## 2019-09-03 NOTE — Telephone Encounter (Signed)
Last Visit:07/09/2019     Upcoming appointments: 10/08/2019           Erskine Speed, RN  09/03/2019, 13:30

## 2019-09-08 ENCOUNTER — Other Ambulatory Visit (INDEPENDENT_AMBULATORY_CARE_PROVIDER_SITE_OTHER): Payer: Self-pay | Admitting: NURSE PRACTITIONER

## 2019-09-10 ENCOUNTER — Encounter (INDEPENDENT_AMBULATORY_CARE_PROVIDER_SITE_OTHER): Payer: Self-pay | Admitting: Otolaryngology

## 2019-09-11 ENCOUNTER — Other Ambulatory Visit (INDEPENDENT_AMBULATORY_CARE_PROVIDER_SITE_OTHER): Payer: Self-pay | Admitting: NURSE PRACTITIONER

## 2019-09-12 NOTE — Telephone Encounter (Signed)
Last Visit:07/09/2019     Upcoming appointments: 10/08/2019           Erskine Speed, RN  09/12/2019, 10:45

## 2019-09-13 ENCOUNTER — Other Ambulatory Visit (INDEPENDENT_AMBULATORY_CARE_PROVIDER_SITE_OTHER): Payer: Self-pay | Admitting: Family Medicine

## 2019-09-14 NOTE — Telephone Encounter (Signed)
Last Visit:07/09/2019     Upcoming appointments: 10/08/2019           Erskine Speed, RN  09/14/2019, 08:16

## 2019-09-24 ENCOUNTER — Other Ambulatory Visit (INDEPENDENT_AMBULATORY_CARE_PROVIDER_SITE_OTHER): Payer: Self-pay | Admitting: NURSE PRACTITIONER

## 2019-09-26 NOTE — Telephone Encounter (Signed)
Last Visit:07/09/2019     Upcoming appointments: 10/08/2019        Erskine Speed, RN  09/26/2019, 13:54

## 2019-09-28 ENCOUNTER — Other Ambulatory Visit (INDEPENDENT_AMBULATORY_CARE_PROVIDER_SITE_OTHER): Payer: Self-pay | Admitting: Physician Assistant

## 2019-09-28 ENCOUNTER — Other Ambulatory Visit (INDEPENDENT_AMBULATORY_CARE_PROVIDER_SITE_OTHER): Payer: Self-pay | Admitting: Family Medicine

## 2019-09-30 ENCOUNTER — Other Ambulatory Visit (INDEPENDENT_AMBULATORY_CARE_PROVIDER_SITE_OTHER): Payer: Self-pay | Admitting: NURSE PRACTITIONER

## 2019-10-02 NOTE — Telephone Encounter (Signed)
Last Visit:07/09/2019     Upcoming appointments: 10/08/2019        Erskine Speed, RN  10/02/2019, 09:32

## 2019-10-02 NOTE — Telephone Encounter (Signed)
Last Visit:07/09/2019     Upcoming appointments: 10/08/2019           Steve Young, [place]  10/02/2019, 20:19

## 2019-10-02 NOTE — Telephone Encounter (Signed)
Last Visit:07/09/2019     Upcoming appointments: 10/08/2019           Erskine Speed, RN  10/02/2019, 09:32

## 2019-10-03 NOTE — Telephone Encounter (Signed)
Refill requested too soon.

## 2019-10-08 ENCOUNTER — Encounter (INDEPENDENT_AMBULATORY_CARE_PROVIDER_SITE_OTHER): Payer: Medicare Other | Admitting: NURSE PRACTITIONER

## 2019-10-08 ENCOUNTER — Encounter (INDEPENDENT_AMBULATORY_CARE_PROVIDER_SITE_OTHER): Payer: Self-pay | Admitting: NURSE PRACTITIONER

## 2019-10-12 ENCOUNTER — Other Ambulatory Visit (INDEPENDENT_AMBULATORY_CARE_PROVIDER_SITE_OTHER): Payer: Self-pay | Admitting: NURSE PRACTITIONER

## 2019-10-12 NOTE — Telephone Encounter (Signed)
Last Visit:07/09/2019     Upcoming appointments: Visit date not found           Erskine Speed, RN  10/12/2019, 12:34

## 2019-10-15 ENCOUNTER — Other Ambulatory Visit (INDEPENDENT_AMBULATORY_CARE_PROVIDER_SITE_OTHER): Payer: Self-pay | Admitting: Family Medicine

## 2019-10-17 NOTE — Telephone Encounter (Signed)
Last Visit:05/07/2019     Upcoming appointments: no upcoming appts          Herbert Spires, MA  10/17/2019, 15:13

## 2019-10-22 ENCOUNTER — Other Ambulatory Visit (INDEPENDENT_AMBULATORY_CARE_PROVIDER_SITE_OTHER): Payer: Self-pay | Admitting: NURSE PRACTITIONER

## 2019-10-22 NOTE — Telephone Encounter (Signed)
Last Visit:05/07/2019     Upcoming appointments: No upcoming appts          Herbert Spires, MA  10/22/2019, 13:23

## 2019-10-29 ENCOUNTER — Other Ambulatory Visit (INDEPENDENT_AMBULATORY_CARE_PROVIDER_SITE_OTHER): Payer: Self-pay | Admitting: NURSE PRACTITIONER

## 2019-10-30 ENCOUNTER — Other Ambulatory Visit (INDEPENDENT_AMBULATORY_CARE_PROVIDER_SITE_OTHER): Payer: Self-pay | Admitting: NURSE PRACTITIONER

## 2019-11-01 NOTE — Telephone Encounter (Signed)
Last Visit:07/09/2019     Upcoming appointments: Visit date not found           Erskine Speed, RN  11/01/2019, 13:04

## 2019-11-01 NOTE — Telephone Encounter (Signed)
Last Visit:07/09/2019     Upcoming appointments: Visit date not found           Erskine Speed, RN  11/01/2019, 15:45

## 2019-11-02 ENCOUNTER — Other Ambulatory Visit (INDEPENDENT_AMBULATORY_CARE_PROVIDER_SITE_OTHER): Payer: Self-pay | Admitting: Family Medicine

## 2019-11-05 ENCOUNTER — Ambulatory Visit
Admission: RE | Admit: 2019-11-05 | Discharge: 2019-11-05 | Disposition: A | Discharge status: Home / Self Care | Payer: Medicare Other | Source: Ambulatory Visit | Attending: PHYSICIAN ASSISTANT | Admitting: PHYSICIAN ASSISTANT

## 2019-11-05 ENCOUNTER — Encounter (INDEPENDENT_AMBULATORY_CARE_PROVIDER_SITE_OTHER): Payer: Self-pay | Admitting: Neurological Surgery

## 2019-11-05 ENCOUNTER — Ambulatory Visit (HOSPITAL_BASED_OUTPATIENT_CLINIC_OR_DEPARTMENT_OTHER): Payer: Medicare Other | Admitting: Neurological Surgery

## 2019-11-05 ENCOUNTER — Other Ambulatory Visit: Payer: Self-pay

## 2019-11-05 VITALS — BP 156/94 | HR 70 | Temp 96.8°F | Ht 69.02 in | Wt 237.0 lb

## 2019-11-05 DIAGNOSIS — G9389 Other specified disorders of brain: Secondary | ICD-10-CM

## 2019-11-05 DIAGNOSIS — E236 Other disorders of pituitary gland: Secondary | ICD-10-CM

## 2019-11-05 DIAGNOSIS — R040 Epistaxis: Secondary | ICD-10-CM | POA: Insufficient documentation

## 2019-11-05 MED ORDER — GADOBUTROL 1 MMOL/ML (604.72 MG/ML) INTRAVENOUS SOLUTION
10.50 mL | INTRAVENOUS | Status: AC
Start: 2019-11-05 — End: 2019-11-05
  Administered 2019-11-05: 10.5 mL via INTRAVENOUS

## 2019-11-05 NOTE — Progress Notes (Addendum)
NEUROSURGERY, PHYSICIAN OFFICE CENTER  [place] [address]  Operated by [facility]  Progress Note    Name: Steve Young MRN:  [id number]   Date: 11/05/2019 Age: 59 y.o.       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 He has occasional headaches but denies syncope. He denies vision changes, lateralized ntw or seizures.    ENT - [facility] in [place] for intermittent epistaxis and hematorrhea.  He has occasional HTN.  Objective:   Vital Signs:  BP (!) 156/94    Pulse 70    Temp 36 C (96.8 F) (Thermal Scan)    Ht 1.753 m (5' 9.02")    Wt 107 kg (236 lb 15.9 oz)    SpO2 98%    BMI 34.98 kg/m       Constitutional  General appearance: Normal  Eyes: Ophthalmic exam of optic discs and posterior segments: Normal  Cardiovascular:   Pulses pt 2+  Musculoskeletal  Gait and Station: : Normal  Muscle strength (upper extremities): : Normal  Muscle strength (lower extremities): : Normal  Muscle tone (upper extremities): : Normal  Muscle tone (lower extremities): : Normal  Sensation: Normal  Coordination: Normal    Neurological  Orientation: Normal  Recent and remote memory: Normal  Attention span and concentration: Normal  Language: Normal  Fund of knowledge: Normal    Cranial Nerves  2nd: Normal  3rd,4th,6th: Normal  5th: Normal  7th: Normal  8th: Normal  9th: Normal  11th: Normal  12th: Normal      Data reviewed    1. MRI sella performed on 11/05/2019 on [facility] PACS and it shows no change in sellar lesion.     Discussions with other providers:     Assessment:    1. Rathke's cleft cyst (CMS HCC)    2. Epistaxis        Recommendations:  RTC in 1 yr with sella mri w and wo. Will refer to hematology for epistaxis and hematorrhea.   The patient was seen as a shared visit with the co-signing  faculty.  Barrie Folk, PA-C    I personally saw and evaluated the patient. See mid-level's note for additional details. My findings/participation are sellar mass stable.  FU as above.    Dagmar Hait, MD

## 2019-11-06 ENCOUNTER — Other Ambulatory Visit: Payer: Self-pay

## 2019-11-06 ENCOUNTER — Ambulatory Visit (INDEPENDENT_AMBULATORY_CARE_PROVIDER_SITE_OTHER): Payer: Medicaid Other | Admitting: PHYSICIAN ASSISTANT

## 2019-11-06 ENCOUNTER — Encounter (INDEPENDENT_AMBULATORY_CARE_PROVIDER_SITE_OTHER): Payer: Self-pay | Admitting: PHYSICIAN ASSISTANT

## 2019-11-06 VITALS — BP 110/80 | HR 57 | Temp 97.5°F | Ht 69.02 in | Wt 237.4 lb

## 2019-11-06 DIAGNOSIS — E236 Other disorders of pituitary gland: Secondary | ICD-10-CM

## 2019-11-06 DIAGNOSIS — J4540 Moderate persistent asthma, uncomplicated: Secondary | ICD-10-CM

## 2019-11-06 DIAGNOSIS — E559 Vitamin D deficiency, unspecified: Secondary | ICD-10-CM

## 2019-11-06 DIAGNOSIS — M170 Bilateral primary osteoarthritis of knee: Secondary | ICD-10-CM

## 2019-11-06 DIAGNOSIS — R040 Epistaxis: Secondary | ICD-10-CM

## 2019-11-06 DIAGNOSIS — F5104 Psychophysiologic insomnia: Secondary | ICD-10-CM

## 2019-11-06 DIAGNOSIS — R7303 Prediabetes: Secondary | ICD-10-CM

## 2019-11-06 DIAGNOSIS — I10 Essential (primary) hypertension: Secondary | ICD-10-CM

## 2019-11-06 DIAGNOSIS — E785 Hyperlipidemia, unspecified: Secondary | ICD-10-CM

## 2019-11-06 MED ORDER — ZOLPIDEM 10 MG TABLET
ORAL_TABLET | ORAL | 0 refills | Status: DC
Start: 2019-11-06 — End: 2019-12-13

## 2019-11-06 MED ORDER — ZOLPIDEM 10 MG TABLET
ORAL_TABLET | ORAL | 0 refills | Status: DC
Start: 2019-11-06 — End: 2019-11-06

## 2019-11-06 NOTE — Telephone Encounter (Signed)
Regarding: Talaman-Perez pt//Rx issues/refill  ----- Message from Abelardo Diesel sent at 11/05/2019  4:34 PM EDT -----  Wynonia Hazard, MD    PT calling again about the below request.  Medication has been refused, but there is no notation as to why.  He is wanting a call to discuss.    Thanks,    Abelardo Diesel    ----- Message from Laurann Montana sent at 11/05/2019 12:22 PM EDT -----  The pt would like a status update on this issue. Please advise the pt at [phone]    ----- Message from Flonnie Hailstone II sent at 11/02/2019  1:00 PM EDT -----  Wynonia Hazard, MD    Pt requesting refill of the below medication. Thank you.    zolpidem (AMBIEN) 10 mg Oral Tablet 21 Tablet 0 10/12/2019    Sig: take 1 tablet by mouth at bedtime if needed   Sent to pharmacy as: zolpidem 10 mg tablet Lorrin Mais)   Non-formulary Exception Code: PHKFE/XM Formulary Info Available     Preferred Pharmacy     [facility], [place] - [place]    [place] [address]    Phone: [phone] Fax: [phone]    Hours: Not open 24 hours

## 2019-11-06 NOTE — Telephone Encounter (Signed)
Medication request sent to Dr. Shona Simpson at the [facility].  Medication was sent to [facility].  Patient has not been seen in [facility].  Katina Degree, RN  11/06/2019, 15:53

## 2019-11-06 NOTE — Patient Instructions (Signed)
Get labs done after July 1st- fasting 8 hrs.

## 2019-11-06 NOTE — Nursing Note (Signed)
Pt is here for a f/u. Pt states he has no complaints at this time.

## 2019-11-06 NOTE — Progress Notes (Signed)
PRIMARY CARE, [place]  [place] [id number]       Name: Steve Young MRN:  [id number]   Date: 11/06/2019 Age: 59 y.o.       CC:   Steve Young is a 59 y.o. male who presents to the office for  Follow-up      HPI:    Steve Young is a 59 y.o. male who presents to the office today for routine follow up and medication refills.     He has history of hypertension and reports taking medications as prescribed.  He is not check his blood pressure at home.  Denies any chest pain, shortness of breath, blurry vision.    He is following with Neurosurgery for evaluation of Rathke's cleft cyst. He has recent appointment yesterday 11/05/19. He had MRI of sella turcica yesterday but no results available at this time. He reports he was referred to specialist as well and has an upcoming appointment tomorrow but is not sure what this is for. He states that he does not wish to go as he is frustrated that he is not getting any answers. He reports having frequent episodes every few days to every few weeks of epistaxis with some blood from the ears as well. He has followed with ENT in the past as well. Review of after visit summary from is appointment yesterday shows patient is scheduled with hematology. Patient does not wish to travel to [facility] hematology and would like referral to alternative provider locally in [place].     He has history of osteoarthritis and reports stable at this time. He also has history of chronic pain and follows with the pain clinic.     He takes Prilosec daily for acid reflux and reports good control of symptoms. Denies abdominal pain, n/v/d.     He takes Ambien for chronic insomnia and reports this is the only thing that helps him.     He is due for colonoscopy but declines at this time. He states he will consider at next office visit.       REVIEW OF SYSTEMS:  GENERAL: Denies fatigue, fever, chills  SKIN: Denies rash or lesions.   HEENT: Denies  ear pain, rhinorrhea, blurry vision or sore throat.   RESPIRATORY: Denies SOB, cough, wheezing  CARDIAC: Denies chest pains; palpitations, edema.  GI: Denies NVDC, abdominal pain.  MUSCULOSKELETAL: Denies myalgias and arthralgias.  NEUROLOGIC: Admits headache. Denies numbness, tingling and weakness.  ENDOCRINE: Denies fatigue, weight gain, constipation or cold intolerence  GU: Denies urinary Sxs or flank pain.  HEMATOLOGIC: Denies bleeding or bruising tendency.  PSYCHIATRIC: Denies any anxiety, depression, SI or HI.       No Known Allergies     Current Outpatient Medications   Medication Sig   . albuterol sulfate (PROVENTIL OR VENTOLIN OR PROAIR) 90 mcg/actuation Inhalation HFA Aerosol Inhaler inhale 1 puff by mouth every 4 hours if needed wheezing   . amLODIPine (NORVASC) 10 mg Oral Tablet take 1 tablet by mouth once daily   . aspirin (ECOTRIN) 81 mg Oral Tablet, Delayed Release (E.C.) take 1 tablet by mouth once daily   . atorvastatin (LIPITOR) 80 mg Oral Tablet take 1 tablet by mouth at bedtime   . chlorthalidone (HYGROTON) 25 mg Oral Tablet take 1 tablet by mouth once daily   . cholecalciferol, vitamin D3, 125 mcg (5,000 unit) Oral Capsule take 1 capsule by mouth once daily   . diclofenac  sodium (VOLTAREN) 1 % Gel by Apply Topically route Four times a day - before meals and bedtime   . folic acid (FOLVITE) 1 mg Oral Tablet Take 1 Tab (1 mg total) by mouth Once a day   . gabapentin (NEURONTIN) 600 mg Oral Tablet take 1 tablet by mouth three times a day   . Ibuprofen (MOTRIN) 800 mg Oral Tablet take 1 tablet by mouth three times a day if needed for pain take with food   . losartan (COZAAR) 100 mg Oral Tablet take 1 tablet by mouth once daily   . lubiprostone (AMITIZA) 24 mcg Oral Capsule Take 1 Cap (24 mcg total) by mouth Twice daily   . magnesium oxide (MAG-OX) 400 mg (241.3 mg magnesium) Oral Tablet Take 400 mg by mouth   . methocarbamol (ROBAXIN) 500 mg Oral Tablet    . mupirocin (BACTROBAN) 2 % Ointment by  Apply Topically route Three times a day   . NARCAN 4 mg/actuation Nasal Spray, Non-Aerosol INSTILL 1 SPRAY NASALLY AS NEEDED PER CDC RECOMMENDATIONS   . omega-3 fatty acid (LOVAZA) 1 gram Oral Capsule take 2 capsules by mouth twice a day   . omeprazole (PRILOSEC) 40 mg Oral Capsule, Delayed Release(E.C.) take 1 capsule by mouth once daily   . oxyCODONE-acetaminophen (PERCOCET) 10-325 mg Oral Tablet take 1 tablet by mouth three times a day if needed for pain   . PARoxetine (PAXIL) 10 mg Oral Tablet take 1 tablet by mouth once daily   . Sildenafil (VIAGRA) 25 mg Oral Tablet take 1 tablet by mouth every 24 hours if needed   . SYMBICORT 160-4.5 mcg/actuation Inhalation HFA Aerosol Inhaler inhale 2 puffs by mouth twice a day Rinse mouth after use   . zafirlukast (ACCOLATE) 20 mg Oral Tablet take 1 tablet by mouth twice a day before meals   . zolpidem (AMBIEN) 10 mg Oral Tablet take 1 tablet by mouth at bedtime if needed      Past Medical History:   Diagnosis Date   . Arthralgia of hip 03/21/2018   . Arthralgia of right upper arm 03/30/2016   . Asthma    . Cervicalgia 03/21/2018   . Chronic abdominal pain 10/04/2018   . Chronic back pain 03/21/2018   . Chronic chest pain 03/21/2018   . Chronic insomnia    . Constipation in male 09/13/2017   . Esophageal reflux    . 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 Hepatic lesion 10/04/2018   . Hyperlipidemia    . Hypertension    . Impingement syndrome of left shoulder 12/28/2017   . Mild persistent asthma without complication 50/38/8828   . Normal cardiac stress test 03/21/2018   . Nosebleed    . Obesity    . 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 Partial tear of common extensor tendon of elbow 12/13/2017   . Pituitary tumor    . Pre-diabetes    . Rib pain 10/04/2018   . Sexual dysfunction    . Tear of talofibular ligament 05/30/2013    RIGHT   . Vitamin D deficiency          Patient Active Problem List   Diagnosis   . Essential hypertension   . Hyperlipidemia LDL goal <130   .  Primary osteoarthritis of both knees   . Obesity (BMI 30-39.9)   . Primary insomnia   . Reflux gastritis   . Vitamin D deficiency   . Chronic musculoskeletal pain   . Hepatic steatosis   .  Drug-induced constipation   . Moderate persistent asthma without complication   . Chronic insomnia   . PTSD (post-traumatic stress disorder)   . Pituitary tumor     Past Surgical History:   Procedure Laterality Date   . COLONOSCOPY     . ELBOW SURGERY Left 12/2017   . HX OTHER  2004     SPINAL FUSION,ANT,EA ADNL LEVEL c3-c7   . HX OTHER Right     right great toe   . HX SHOULDER SURGERY Bilateral    . NOSE SURGERY           Social History     Tobacco Use   . Smoking status: Never Smoker   . Smokeless tobacco: Never Used   Substance Use Topics   . Alcohol use: Not Currently      Family Medical History:     Problem Relation (Age of Onset)    Cancer Mother, Sister              OBJECTIVE:   PHYSICAL EXAM:     Vitals: BP 110/80 (Site: Left, Patient Position: Sitting, Cuff Size: Adult Large)   Pulse 57   Temp 36.4 C (97.5 F) (Thermal Scan)   Ht 1.753 m (5' 9.02")   Wt 108 kg (237 lb 7 oz)   SpO2 97%   BMI 35.04 kg/m       GENERAL: Alert and oriented x 3. No acute distress. Looks stated age.   HEENT:   Head Normocephalic. No masses, lesions, tenderness or abnormalities noted  Eyes: PERRLA, EOMI, conjunctiva wnl.    Ears: Bilateral TMs Pearly grey with normal cone of light reflex. Non-erythematous ear canal. No cerumen impaction.   Nose: Bilateral nares patent. No rhinorrhea.   Throat: Pharynx clear.   RESPIRATORY: No W/R/R. CTAB. No respiratory distress.   HEART: Regular rate and rhythm, no murmur, no edema.  EXTREMITIES: no edema, erythema or tenderness  NEURO: AAOx3, Gait is grossly normal. No focal neurological deficits.   MUSCULOSKELETAL:  Normal gait. Pt ambulates without aid of assistive device.     ASSESSMENT/PLAN:   1. Essential hypertension  Stable at this time.  - CBC; Future  - Comp Metabolic Panel-Fasting; Future     2. Hyperlipidemia LDL goal <130  Stable at this time.  - Lipid Panel; Future    3. Moderate persistent asthma without complication  Stable at this time.  - CBC; Future  - Comp Metabolic Panel-Fasting; Future    4. Primary osteoarthritis of both knees  Stable at this time.  - CBC; Future  - Comp Metabolic Panel-Fasting; Future    5. Vitamin D deficiency  Will check labs.  - VITAMIN D 25 TOTAL; Future    6. Chronic insomnia  - CBC; Future  - Comp Metabolic Panel-Fasting; Future  - zolpidem (AMBIEN) 10 mg Oral Tablet; take 1 tablet by mouth at bedtime if needed  Dispense: 21 Tablet; Refill: 0    7. Prediabetes  Will check labs   - Comp Metabolic Panel-Fasting; Future  - Hemoglobin A1C; Future    8. Epistaxis  Patient referred to hematology locally in [place] as he does not wish to travel to [place].   - Refer to [facility]; Future    9. Rathke's cleft cyst (CMS [facility])  Following with neurosurgery and had recent MRI not resulted yet. Continue with follow up.     Patient advised to call the office or report to the ED with any new or worsening  symptoms. He is to have fasting blood work completed and will call with any abnormal results. He is to continue to follow up with neurosurgery and schedule apt with hematology regarding epistaxis. Patient is to follow up in the office in 3 months or sooner if needed.     Orders Placed This Encounter   . CBC   . Comp Metabolic Panel-Fasting   . Lipid Panel   . Hemoglobin A1C   . VITAMIN D 25 TOTAL   . CANCELED: OUTSIDE CONSULT/REFERRAL PROVIDER(AMB)   . Refer to [facility]   . zolpidem (AMBIEN) 10 mg Oral Tablet         The supervising/collaborating physician on site for this visit was Dr. Carlisle Cater.      Rocco Serene, PA-C  11/06/2019,

## 2019-11-07 ENCOUNTER — Encounter (INDEPENDENT_AMBULATORY_CARE_PROVIDER_SITE_OTHER): Payer: Self-pay | Admitting: Otolaryngology

## 2019-11-07 ENCOUNTER — Ambulatory Visit: Payer: Medicare Other | Attending: PHYSICIAN ASSISTANT | Admitting: Hematology & Oncology

## 2019-11-07 DIAGNOSIS — H9223 Otorrhagia, bilateral: Secondary | ICD-10-CM

## 2019-11-07 DIAGNOSIS — R040 Epistaxis: Secondary | ICD-10-CM | POA: Insufficient documentation

## 2019-11-07 LAB — COMPREHENSIVE METABOLIC PANEL, NON-FASTING
ALBUMIN/GLOBULIN RATIO: 1.5 (ref >=1.0)
ALBUMIN: 4.4 g/dL (ref 3.5–5.2)
ALKALINE PHOSPHATASE: 71 U/L (ref 41–133)
ALT (SGPT): 22 U/L (ref 0–55)
ANION GAP: 8 mmol/L (ref 6–15)
AST (SGOT): 14 U/L (ref 5–34)
BILIRUBIN TOTAL: 1.1 mg/dL (ref 0.2–1.2)
BUN: 11 mg/dL (ref 7–21)
CALCIUM: 9.6 mg/dL (ref 8.0–10.6)
CHLORIDE: 101 mmol/L (ref 98–107)
CO2 TOTAL: 29 mmol/L (ref 21–32)
CREATININE: 1.08 mg/dL (ref 0.80–1.60)
ESTIMATED GFR: >60 mL/min/{1.73_m2}
GLUCOSE: 110 mg/dL — ABNORMAL HIGH (ref 70–100)
POTASSIUM: 3.5 mmol/L (ref 3.3–5.1)
PROTEIN TOTAL: 7.3 g/dL (ref 6.4–8.3)
SODIUM: 138 mmol/L (ref 136–146)

## 2019-11-07 LAB — D-DIMER: D-DIMER: 0.26 mg/L FEU (ref <0.50)

## 2019-11-07 LAB — CBC WITH DIFF
BASOPHIL #: <0.1 10*3/uL (ref <=0.20)
BASOPHIL %: 1 %
EOSINOPHIL #: 0.11 10*3/uL (ref <=0.50)
EOSINOPHIL %: 1 %
HCT: 46.3 % (ref 38.9–52.0)
HGB: 16 g/dL (ref 13.4–17.5)
IMMATURE GRANULOCYTE #: <0.1 10*3/uL (ref <0.10)
IMMATURE GRANULOCYTE %: 0 % (ref 0–1)
LYMPHOCYTE #: 2.33 10*3/uL (ref 1.00–4.80)
LYMPHOCYTE %: 22 %
MCH: 29.4 pg (ref 26.0–32.0)
MCHC: 34.6 g/dL (ref 31.0–35.5)
MCV: 85.1 fL (ref 78.0–100.0)
MONOCYTE #: 0.71 10*3/uL (ref 0.20–1.10)
MONOCYTE %: 7 %
MPV: 11.2 fL (ref 8.7–12.5)
NEUTROPHIL #: 7.35 10*3/uL (ref 1.50–7.70)
NEUTROPHIL %: 69 %
PLATELETS: 251 10*3/uL (ref 150–400)
RBC: 5.44 10*6/uL (ref 4.50–6.10)
RDW-CV: 13.2 % (ref 11.5–15.5)
WBC: 10.6 10*3/uL (ref 3.7–11.0)

## 2019-11-07 LAB — FIBRINOGEN: FIBRINOGEN: 420 mg/dL (ref 200–430)

## 2019-11-07 LAB — PTT (PARTIAL THROMBOPLASTIN TIME): APTT: 24.7 seconds (ref 23.0–32.0)

## 2019-11-07 LAB — PT/INR
INR: 0.94 (ref 0.90–1.10)
PROTHROMBIN TIME: 9.9 seconds (ref 9.0–13.0)

## 2019-11-07 NOTE — Addendum Note (Signed)
Addended by: Avie Echevaria on: 11/07/2019 05:48 PM     Modules accepted: Orders

## 2019-11-07 NOTE — Progress Notes (Signed)
[facility]  OUTPATIENT CONSULT NOTE  Eula Listen    DATE:  11/07/2019  NAME:  Steve Young  MRN:  [id number]    Referring Physician:  Rocco Serene, PA-C  Primary Care Provider:  Wynonia Hazard, MD    CHIEF COMPLAINT:    Chief Complaint   Patient presents with   . Epistaxis     HISTORY OF PRESENT ILLNESS:    59 y.o. year old male presents to clinic with symptoms of bleeding from ears and nose, q 3-4 weeks, for a year.    Patient has been bleeding from his nose and ears for over a year.      When it happens he bleeds from both his ears and it comes down the neck and his nose bleeds at the same time usually making his shirt red.     It comes on suddenly and lasts a minute.  When it happens sometimes he gets dizzy and has bad headaches.  But neither the dizziness or the headache precedes the episodes.  He has never had a seizure but once when he was driving in [place] it happened an he got woozy and had to pull the vehicle over and EMS found him and took him to a hospital.    It happens every 3-4 weeks.  It is not preceded by strenous activity or any other symptoms.    He does not note any other sites of bleeding.      There is no history of bleeding as a child or any other bleeding sites.      PMH:    Past Medical History:   Diagnosis Date   . Arthralgia of hip 03/21/2018   . Arthralgia of right upper arm 03/30/2016   . Asthma    . Cervicalgia 03/21/2018   . Chronic abdominal pain 10/04/2018   . Chronic back pain 03/21/2018   . Chronic chest pain 03/21/2018   . Chronic insomnia    . Constipation in male 09/13/2017   . Esophageal reflux    . 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 Hepatic lesion 10/04/2018   . Hyperlipidemia    . Hypertension    . Impingement syndrome of left shoulder 12/28/2017   . Mild persistent asthma without complication 23/76/2831   . Normal cardiac stress test 03/21/2018   . Nosebleed    . Obesity    . 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 Partial tear of common extensor  tendon of elbow 12/13/2017   . Pituitary tumor    . Pre-diabetes    . Rib pain 10/04/2018   . Sexual dysfunction    . Tear of talofibular ligament 05/30/2013    RIGHT   . Vitamin D deficiency      PSH:    Past Surgical History:   Procedure Laterality Date   . COLONOSCOPY     . ELBOW SURGERY Left 12/2017   . HX OTHER  2004     SPINAL FUSION,ANT,EA ADNL LEVEL c3-c7   . HX OTHER Right     right great toe   . HX SHOULDER SURGERY Bilateral    . NOSE SURGERY       SOCIAL HISTORY:    Social History     Socioeconomic History   . Marital status: Divorced     Spouse name: Not on file   . Number of children: 1   . Years of education: Not on file   . Highest education level: Not on file  Occupational History   . 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 Smoking status: Never Smoker   . Smokeless tobacco: Never Used   Vaping Use   . Vaping Use: Never used   Substance and Sexual Activity   . Alcohol use: Not Currently   . Drug use: Never   Social History Narrative    Right handed.      Social Determinants of Health     Financial Resource Strain:    . Difficulty of Paying Living Expenses:    Food Insecurity:    . Worried About Charity fundraiser in the Last Year:    . Arboriculturist in the Last Year:    Transportation Needs:    . Film/video editor (Medical):    Marland Kitchen Lack of Transportation (Non-Medical):    Physical Activity:    . Days of Exercise per Week:    . Minutes of Exercise per Session:    Stress:    . Feeling of Stress :    Intimate Partner Violence:    . Fear of Current or Ex-Partner:    . Emotionally Abused:    Marland Kitchen Physically Abused:    . Sexually Abused:        FAMILY HISTORY:    Family Medical History:     Problem Relation (Age of Onset)    Cancer Mother, Sister        MEDS:    Current Outpatient Medications:   .  albuterol sulfate (PROVENTIL OR VENTOLIN OR PROAIR) 90 mcg/actuation Inhalation HFA Aerosol Inhaler, inhale 1 puff by mouth every 4 hours if needed wheezing, Disp: 18 g,  Rfl: 3  .  amLODIPine (NORVASC) 10 mg Oral Tablet, take 1 tablet by mouth once daily, Disp: 30 Tablet, Rfl: 3  .  aspirin (ECOTRIN) 81 mg Oral Tablet, Delayed Release (E.C.), take 1 tablet by mouth once daily, Disp: 90 Tablet, Rfl: 3  .  atorvastatin (LIPITOR) 80 mg Oral Tablet, take 1 tablet by mouth at bedtime, Disp: 90 Tablet, Rfl: 3  .  chlorthalidone (HYGROTON) 25 mg Oral Tablet, take 1 tablet by mouth once daily, Disp: 30 Tablet, Rfl: 3  .  cholecalciferol, vitamin D3, 125 mcg (5,000 unit) Oral Capsule, take 1 capsule by mouth once daily, Disp: 90 Capsule, Rfl: 3  .  diclofenac sodium (VOLTAREN) 1 % Gel, by Apply Topically route Four times a day - before meals and bedtime, Disp: 20 g, Rfl: 3  .  folic acid (FOLVITE) 1 mg Oral Tablet, Take 1 Tab (1 mg total) by mouth Once a day, Disp: 90 Tab, Rfl: 3  .  gabapentin (NEURONTIN) 600 mg Oral Tablet, take 1 tablet by mouth three times a day, Disp: 90 Tablet, Rfl: 0  .  Ibuprofen (MOTRIN) 800 mg Oral Tablet, take 1 tablet by mouth three times a day if needed for pain take with food, Disp: 90 Tablet, Rfl: 3  .  losartan (COZAAR) 100 mg Oral Tablet, take 1 tablet by mouth once daily, Disp: 90 Tablet, Rfl: 3  .  lubiprostone (AMITIZA) 24 mcg Oral Capsule, Take 1 Cap (24 mcg total) by mouth Twice daily, Disp: 60 Cap, Rfl: 3  .  magnesium oxide (MAG-OX) 400 mg (241.3 mg magnesium) Oral Tablet, Take 400 mg by mouth, Disp: , Rfl:   .  methocarbamol (ROBAXIN) 500 mg Oral Tablet, , Disp: , Rfl:   .  mupirocin (BACTROBAN) 2 % Ointment,  by Apply Topically route Three times a day, Disp: 30 g, Rfl: 3  .  NARCAN 4 mg/actuation Nasal Spray, Non-Aerosol, INSTILL 1 SPRAY NASALLY AS NEEDED PER CDC RECOMMENDATIONS, Disp: , Rfl:   .  omega-3 fatty acid (LOVAZA) 1 gram Oral Capsule, take 2 capsules by mouth twice a day, Disp: 360 Capsule, Rfl: 3  .  omeprazole (PRILOSEC) 40 mg Oral Capsule, Delayed Release(E.C.), take 1 capsule by mouth once daily, Disp: 30 Capsule, Rfl: 4  .   oxyCODONE-acetaminophen (PERCOCET) 10-325 mg Oral Tablet, take 1 tablet by mouth three times a day if needed for pain, Disp: , Rfl: 0  .  PARoxetine (PAXIL) 10 mg Oral Tablet, take 1 tablet by mouth once daily, Disp: 30 Tablet, Rfl: 3  .  Sildenafil (VIAGRA) 25 mg Oral Tablet, take 1 tablet by mouth every 24 hours if needed, Disp: 10 Tab, Rfl: 1  .  SYMBICORT 160-4.5 mcg/actuation Inhalation HFA Aerosol Inhaler, inhale 2 puffs by mouth twice a day Rinse mouth after use, Disp: 1 Each, Rfl: 3  .  zafirlukast (ACCOLATE) 20 mg Oral Tablet, take 1 tablet by mouth twice a day before meals, Disp: 60 Tablet, Rfl: 3  .  zolpidem (AMBIEN) 10 mg Oral Tablet, take 1 tablet by mouth at bedtime if needed, Disp: 21 Tablet, Rfl: 0    ALLERGIES:    No Known Allergies    ROS:    Review of Systems   Constitutional: Positive for fatigue.   HENT:   Positive for nosebleeds.    Respiratory: Negative.    Cardiovascular: Negative.    Gastrointestinal: Negative.    Genitourinary: Negative.     Musculoskeletal: Negative.    Neurological: Positive for dizziness and headaches.   Hematological: Negative.      PHYSICAL EXAMINATION:    Vitals:    11/07/19 1500   BP: 124/73   Pulse: 72   Resp: 18   Temp: 37 C (98.6 F)   SpO2: 96%   Weight: 107 kg (235 lb 3.2 oz)   Height: 1.725 m (5' 7.91")   BMI: 35.93     Body mass index is 35.85 kg/m.    Performance status: (0) Fully active, able to carry on all predisease performance without restriction    Physical Exam  Constitutional:       Appearance: Normal appearance.   HENT:      Head: Normocephalic and atraumatic.      Right Ear: External ear normal.      Left Ear: External ear normal.      Ears:      Comments: Both tympanic membranes normal and no evidence of bleeding on the membranes or in the canal.  Eyes:      Extraocular Movements: Extraocular movements intact.      Pupils: Pupils are equal, round, and reactive to light.   Cardiovascular:      Rate and Rhythm: Normal rate and regular rhythm.    Pulmonary:      Effort: Pulmonary effort is normal.      Breath sounds: Normal breath sounds.   Abdominal:      General: Abdomen is flat.      Palpations: Abdomen is soft.      Tenderness: There is abdominal tenderness.   Musculoskeletal:         General: Normal range of motion.   Skin:     General: Skin is warm and dry.   Neurological:      General: No focal  deficit present.      Mental Status: He is alert and oriented to person, place, and time.       LABS:    CBC with Diff (Last 48 Hours):  No results for input(s): WBC, HGB, HCT, MCV, PLTCNT, BANDS, PMNS, LYMPHO, LYMPHOCYTES, MONOCYTES, EOSINO, EOSINOPHIL, BASOPHILS in the last 48 hours.    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 MRI brain 05/14/2019.  FINDINGS: The pituitary gland measures 8 to 9 mm in height in the mid sagittal plane. There is a hypo- or non-enhancing lesion in the left aspect of the gland that measures 10 x 8 mm. This is seen on image 7 of series 14 and is unchanged compared to prior exam dated 05/14/2019. There is associated rightward deviation of the pituitary infundibulum, which is normal in thickness. Posterior pituitary is deviated to the right but otherwise not ectopic. No suprasellar mass or mass effect on the optic chiasm. Cavernous sinuses are normal. There is no acute infarct, midline shift or extra-axial fluid collection. Focal encephalomalacia is noted in the anteroinferomedial frontal lobes, greater on the right. No acute intracranial hemorrhage or evidence of  hemosiderin deposition.  Scattered periventricular and subcortical white matter foci of T2/FLAIR hyperintensity are nonspecific, but likely due to mild chronic small vessel ischemic disease. No hydrocephalus. Basal cisterns are patent. There is no cerebellar tonsillar ectopia. Major intracranial vascular flow-voids are intact. A small mucosal retention cyst is noted in the inferior left maxillary sinus.     1. 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 This is unchanged compared to prior MRI of 05/14/2019. 2. Chronic contusions in the anteroinferomedial frontal lobes. 3. Findings consistent with mild chronic microangiopathy.     PROCEDURES:    ASSESSMENT:      59 y.o. year old male presents to clinic with symptoms of bleeding from ears and nose, q 3-4 weeks, for a year.    Patient has been bleeding from his nose and ears for over a year.      When it happens he bleeds from both his ears and it comes down the neck and his nose bleeds at the same time usually making his shirt red.     It comes on suddenly and lasts a minute.  When it happens sometimes he gets dizzy and has bad headaches.  But neither the dizziness or the headache precedes the episodes.  He has never had a seizure but once when he was driving in [place] it happened an he got woozy and had to pull the vehicle over and EMS found him and took him to a hospital.    It happens every 3-4 weeks.  It is not preceded by strenous activity or any other symptoms.    He does not note any other sites of bleeding.      There is no history of bleeding as a child or any other bleeding sites, ear infections or trauma to  the ears.    I'm not quite sure what to make of these symptoms.  Initially, with these symptoms an acquired inhibitor was suspected.  However if he was bleeding from the presence of an inhibitor - these type of bleeds do not resolve without factor supplementation to overcome the inhibitor or  other blood products.    The cyclical, self-limiting nature of the bleeding is puzzling.     He will be getting a work up with cbc, cmp, coags, vWF workup, and a protein electropheresis.    His MRI of brain was reviewed.    PLAN:    1.  Cyclical, self-limited bleeding from ears and nose:    Rule out any coagulation pathway deficiency, von Willebrand deficiency as well as will check protein electropheresis as well.  Will check for factors VIII and XI for any abnormalities and check for inhibitors if PTT is elevated.      2.  RTC in three weeks to review    3.  Instructed to stop his aspirin for now    On the day of the encounter, a total of  45 minutes was spent on this patient encounter including review of historical information, examination, documentation and post-visit activities.  Time documented does NOT include time spent performing IPPE/AWV, supervising nurse-performed AWV or addressing preventive care planning.

## 2019-11-08 ENCOUNTER — Ambulatory Visit (INDEPENDENT_AMBULATORY_CARE_PROVIDER_SITE_OTHER): Payer: Self-pay | Admitting: Family Medicine

## 2019-11-08 NOTE — Addendum Note (Signed)
Addended by: Alvira Philips on: 11/08/2019 08:48 AM     Modules accepted: Orders

## 2019-11-08 NOTE — Addendum Note (Signed)
Addended by: Alvira Philips on: 11/08/2019 09:25 AM     Modules accepted: Orders

## 2019-11-08 NOTE — Telephone Encounter (Signed)
Steve Young states the patient needs a [facility] referral. She provided 2 numbers: [phone], [id number]  Their fax # [phone]

## 2019-11-08 NOTE — Telephone Encounter (Signed)
Regarding: Referral  ----- Message from [place] sent at 11/08/2019  3:00 PM EDT -----  Wynonia Hazard, MD  Darrick Penna is calling she states the Referral for [facility] was put  in incorrectly she needs to speak with someone as soon as spossible .      Thank You Rollene Fare

## 2019-11-08 NOTE — Telephone Encounter (Signed)
Done. See media.

## 2019-11-08 NOTE — Telephone Encounter (Signed)
Patient states hematology drew labs yesterday, also including the same labs as ordered by you. Just wanted to make you aware so you could review.

## 2019-11-08 NOTE — Telephone Encounter (Signed)
Regarding: [facility]Perez, pt call  ----- Message from Era Bumpers sent at 11/07/2019  4:37 PM EDT -----  Wynonia Hazard, MD      Darrick Penna from [facility] called she wants more information and needs a call back  [phone]    Thank Wilton Canary Brim

## 2019-11-08 NOTE — Telephone Encounter (Signed)
Regarding: Tal[facility]-Perezpt//call back  ----- Message from De Hollingshead sent at 11/08/2019  2:55 PM EDT -----  Wynonia Hazard, MD    Pt requesting a call to discuss test results.    Thanks    [employer]

## 2019-11-09 LAB — PROTEIN, TOTAL AND PROTEIN ELECTROPHORESIS W/ REFL IFE
ALBUMIN: 4.3 g/dL (ref 3.8–4.8)
ALPHA 1 GLOBULIN: 0.3 g/dL (ref 0.2–0.3)
ALPHA 2 GLOBULIN: 0.8 g/dL (ref 0.5–0.9)
BETA 1 GLOBULIN: 0.5 g/dL (ref 0.4–0.6)
BETA 2 GLOBULIN: 0.3 g/dL (ref 0.2–0.5)
GAMMA GLOBULIN: 0.9 g/dL (ref 0.8–1.7)
PROTEIN, TOTAL: 7.2 g/dL (ref 6.1–8.1)

## 2019-11-12 NOTE — Telephone Encounter (Signed)
Please let patient know I reviewed the CBC and CMP which were unremarkable. His fasting glucose was slightly elevated and will need to look at the hemoglobin a1c. Unfortunately they were unable to do all the labs so would recommend he get the other labs we ordered done when he can. Thanks.

## 2019-11-12 NOTE — Telephone Encounter (Signed)
Patient aware. Verbalized understanding.

## 2019-11-12 NOTE — Telephone Encounter (Signed)
Left message on machine for patient to call the office for lab results.

## 2019-11-13 ENCOUNTER — Ambulatory Visit (INDEPENDENT_AMBULATORY_CARE_PROVIDER_SITE_OTHER): Payer: Self-pay | Admitting: Family Medicine

## 2019-11-13 NOTE — Telephone Encounter (Signed)
Regarding: pts apppointment  ----- Message from Ladean Raya sent at 11/13/2019  8:44 AM EDT -----  Wynonia Hazard, MD  Darrick Penna is wanting to speak with [facility] regarding pts appointment . Please call back advise    Thank You Rollene Fare

## 2019-11-13 NOTE — Telephone Encounter (Signed)
Steve Young states patient is refusing to go to [facility] because he is going to [facility]

## 2019-11-14 LAB — MISC TEST TO QUEST (FROZEN)

## 2019-11-15 LAB — VON WILLEBRAND COMPREHENSIVE PANEL
FVIII ACT, CLOTTING: 133 % normal (ref 50–180)
PARTIAL THROMBOPLASTIN TIME, ACTIVATED: 27 s (ref 23–32)
RISTOCETIN COFACTOR: 164 % normal (ref 42–200)
VON WILLEBRAND FACTOR ANTIGEN: 197 % (ref 50–217)

## 2019-11-16 ENCOUNTER — Other Ambulatory Visit (INDEPENDENT_AMBULATORY_CARE_PROVIDER_SITE_OTHER): Payer: Self-pay | Admitting: NURSE PRACTITIONER

## 2019-11-16 DIAGNOSIS — J4541 Moderate persistent asthma with (acute) exacerbation: Secondary | ICD-10-CM

## 2019-11-16 NOTE — Telephone Encounter (Signed)
Last Visit:11/06/2019     Upcoming appointments:02/06/2020        Erskine Speed, RN  11/16/2019, 10:13

## 2019-11-24 ENCOUNTER — Other Ambulatory Visit (INDEPENDENT_AMBULATORY_CARE_PROVIDER_SITE_OTHER): Payer: Self-pay | Admitting: NURSE PRACTITIONER

## 2019-11-26 ENCOUNTER — Other Ambulatory Visit (INDEPENDENT_AMBULATORY_CARE_PROVIDER_SITE_OTHER): Payer: Self-pay | Admitting: Family Medicine

## 2019-11-26 DIAGNOSIS — F5104 Psychophysiologic insomnia: Secondary | ICD-10-CM

## 2019-11-26 NOTE — Telephone Encounter (Signed)
Dispensed on 11/25/19. Refill too soon.

## 2019-11-26 NOTE — Telephone Encounter (Signed)
Last Visit:11/06/2019     Upcoming appointments: 02/06/2020           Erskine Speed, RN  11/26/2019, 15:53

## 2019-11-27 ENCOUNTER — Encounter (INDEPENDENT_AMBULATORY_CARE_PROVIDER_SITE_OTHER): Payer: Self-pay | Admitting: Hematology & Oncology

## 2019-11-27 ENCOUNTER — Telehealth (INDEPENDENT_AMBULATORY_CARE_PROVIDER_SITE_OTHER): Payer: Self-pay | Admitting: Family Medicine

## 2019-11-27 DIAGNOSIS — G8929 Other chronic pain: Secondary | ICD-10-CM

## 2019-11-27 NOTE — Telephone Encounter (Signed)
Last Visit:11/06/2019     Upcoming appointments: 02/06/2020           Erskine Speed, RN  11/27/2019, 11:41

## 2019-11-27 NOTE — Telephone Encounter (Signed)
Pt said he spoke with you about being referred to Dr. Celedonio Savage for his right hip.  Can you order a referral?

## 2019-11-27 NOTE — Telephone Encounter (Signed)
I approved the referral.  May schedule.  Thank you

## 2019-11-28 NOTE — Telephone Encounter (Signed)
Paperwork faxed to Dr. Celedonio Savage as requested.

## 2019-12-09 ENCOUNTER — Other Ambulatory Visit (INDEPENDENT_AMBULATORY_CARE_PROVIDER_SITE_OTHER): Payer: Self-pay | Admitting: NURSE PRACTITIONER

## 2019-12-09 DIAGNOSIS — J4541 Moderate persistent asthma with (acute) exacerbation: Secondary | ICD-10-CM

## 2019-12-10 NOTE — Telephone Encounter (Signed)
Last Visit:11/06/2019     Upcoming appointments: 02/06/2020        Erskine Speed, RN  12/10/2019, 10:06

## 2019-12-13 ENCOUNTER — Other Ambulatory Visit (INDEPENDENT_AMBULATORY_CARE_PROVIDER_SITE_OTHER): Payer: Self-pay | Admitting: Family Medicine

## 2019-12-13 DIAGNOSIS — F5104 Psychophysiologic insomnia: Secondary | ICD-10-CM

## 2019-12-13 NOTE — Telephone Encounter (Signed)
Last Visit:11/06/2019     Upcoming appointments: 02/06/2020           Erskine Speed, RN  12/13/2019, 11:25

## 2019-12-18 ENCOUNTER — Encounter (INDEPENDENT_AMBULATORY_CARE_PROVIDER_SITE_OTHER): Payer: Self-pay | Admitting: Family Medicine

## 2019-12-18 DIAGNOSIS — K760 Fatty (change of) liver, not elsewhere classified: Secondary | ICD-10-CM | POA: Insufficient documentation

## 2019-12-21 ENCOUNTER — Other Ambulatory Visit (INDEPENDENT_AMBULATORY_CARE_PROVIDER_SITE_OTHER): Payer: Self-pay | Admitting: NURSE PRACTITIONER

## 2019-12-21 NOTE — Telephone Encounter (Signed)
Last Visit:11/06/2019     Upcoming appointments: 12/24/2019           Erskine Speed, RN  12/21/2019, 10:46

## 2019-12-23 ENCOUNTER — Other Ambulatory Visit (INDEPENDENT_AMBULATORY_CARE_PROVIDER_SITE_OTHER): Payer: Self-pay | Admitting: NURSE PRACTITIONER

## 2019-12-24 ENCOUNTER — Ambulatory Visit (INDEPENDENT_AMBULATORY_CARE_PROVIDER_SITE_OTHER): Payer: Medicare Other | Admitting: Family Medicine

## 2019-12-24 ENCOUNTER — Other Ambulatory Visit: Payer: Self-pay

## 2019-12-24 VITALS — BP 126/74 | HR 87 | Temp 96.9°F | Ht 67.91 in | Wt 232.0 lb

## 2019-12-24 DIAGNOSIS — M7918 Myalgia, other site: Secondary | ICD-10-CM

## 2019-12-24 DIAGNOSIS — Z1211 Encounter for screening for malignant neoplasm of colon: Secondary | ICD-10-CM

## 2019-12-24 DIAGNOSIS — M25552 Pain in left hip: Secondary | ICD-10-CM

## 2019-12-24 DIAGNOSIS — Z7951 Long term (current) use of inhaled steroids: Secondary | ICD-10-CM

## 2019-12-24 DIAGNOSIS — Z79899 Other long term (current) drug therapy: Secondary | ICD-10-CM

## 2019-12-24 DIAGNOSIS — G8929 Other chronic pain: Secondary | ICD-10-CM

## 2019-12-24 DIAGNOSIS — E785 Hyperlipidemia, unspecified: Secondary | ICD-10-CM

## 2019-12-24 DIAGNOSIS — I10 Essential (primary) hypertension: Secondary | ICD-10-CM

## 2019-12-24 DIAGNOSIS — Z6835 Body mass index (BMI) 35.0-35.9, adult: Secondary | ICD-10-CM

## 2019-12-24 DIAGNOSIS — M25551 Pain in right hip: Secondary | ICD-10-CM

## 2019-12-24 DIAGNOSIS — Z7982 Long term (current) use of aspirin: Secondary | ICD-10-CM

## 2019-12-24 DIAGNOSIS — R7303 Prediabetes: Secondary | ICD-10-CM

## 2019-12-24 NOTE — Progress Notes (Signed)
Chief Complaint   Patient presents with   . Follow-up   .  Pain   . Hypertension   . Hyperlipidemia     Last office visit with the provider:  03/06/2019    SUBJECTIVE:  Steve Young is a 59 y.o. male. Patient is having bilateral hip pain.  He has chronic hip pain but has been more painful.  He has lost some weight since last office visit. He does not have the following symptoms today: fever, chills, cough, nasal congestion, sore throat, abdominal pain, headache, dizziness, eye pain or irritation, epistaxis, chest discomfort, stroke symptoms, shortness of breath or edema. Side effects of medications: no current medication side effects. He is due for colonoscopy.     Current Outpatient Medications   Medication Sig   . albuterol sulfate (PROVENTIL OR VENTOLIN OR PROAIR) 90 mcg/actuation Inhalation HFA Aerosol Inhaler inhale 1 puff by mouth every 4 hours if needed wheezing   . amLODIPine (NORVASC) 10 mg Oral Tablet take 1 tablet by mouth once daily   . aspirin (ECOTRIN) 81 mg Oral Tablet, Delayed Release (E.C.) take 1 tablet by mouth once daily   . atorvastatin (LIPITOR) 80 mg Oral Tablet take 1 tablet by mouth at bedtime   . chlorthalidone (HYGROTON) 25 mg Oral Tablet take 1 tablet by mouth once daily   . cholecalciferol, vitamin D3, 125 mcg (5,000 unit) Oral Capsule take 1 capsule by mouth once daily   . diclofenac sodium (VOLTAREN) 1 % Gel by Apply Topically route Four times a day - before meals and bedtime   . folic acid (FOLVITE) 1 mg Oral Tablet Take 1 Tab (1 mg total) by mouth Once a day   . gabapentin (NEURONTIN) 600 mg Oral Tablet take 1 tablet by mouth three times a day   . Ibuprofen (MOTRIN) 800 mg Oral Tablet take 1 tablet by mouth three times a day if needed for pain take with food   . losartan (COZAAR) 100 mg Oral Tablet take 1 tablet by mouth once daily   . lubiprostone (AMITIZA) 24 mcg Oral Capsule Take 1 Cap (24 mcg total) by mouth Twice daily   . magnesium oxide (MAG-OX) 400 mg (241.3 mg  magnesium) Oral Tablet Take 400 mg by mouth   . methocarbamol (ROBAXIN) 500 mg Oral Tablet    . mupirocin (BACTROBAN) 2 % Ointment by Apply Topically route Three times a day   . NARCAN 4 mg/actuation Nasal Spray, Non-Aerosol INSTILL 1 SPRAY NASALLY AS NEEDED PER CDC RECOMMENDATIONS   . omega-3 fatty acid (LOVAZA) 1 gram Oral Capsule take 2 capsules by mouth twice a day   . omeprazole (PRILOSEC) 40 mg Oral Capsule, Delayed Release(E.C.) take 1 capsule by mouth once daily   . oxyCODONE-acetaminophen (PERCOCET) 10-325 mg Oral Tablet take 1 tablet by mouth three times a day if needed for pain   . PARoxetine (PAXIL) 10 mg Oral Tablet take 1 tablet by mouth once daily   . Sildenafil (VIAGRA) 25 mg Oral Tablet take 1 tablet by mouth every 24 hours if needed   . SYMBICORT 160-4.5 mcg/actuation Inhalation HFA Aerosol Inhaler inhale 2 puffs by mouth twice a day Rinse mouth after use   . zafirlukast (ACCOLATE) 20 mg Oral Tablet take 1 tablet by mouth twice a day before meals   . zolpidem (AMBIEN) 10 mg Oral Tablet take 1 tablet by mouth at bedtime if needed      No Known Allergies    Past Surgical History:  Procedure Laterality Date   . COLONOSCOPY     . ELBOW SURGERY Left 12/2017   . HX OTHER  2004     SPINAL FUSION,ANT,EA ADNL LEVEL c3-c7   . HX OTHER Right     right great toe   . HX SHOULDER SURGERY Bilateral    . NOSE SURGERY       Social History     Tobacco Use   . Smoking status: Never Smoker   . Smokeless tobacco: Never Used   Vaping Use   . Vaping Use: Never used   Substance Use Topics   . Alcohol use: Not Currently   . Drug use: Never     OBJECTIVE:   The patient appears to be in no acute distress.  Vitals: BP 126/74 (Site: Left, Patient Position: Sitting, Cuff Size: Adult Large)   Pulse 87   Temp 36.1 C (96.9 F) (Thermal Scan)   Ht 1.725 m (5' 7.91")   Wt 105 kg (232 lb)   SpO2 98%   BMI 35.37 kg/m   General: alert, no acute distress  Head: normocephalic, atraumatic  Eye: EOM intact, normal  conjunctiva  Respiratory: Good diaphragmatic excursion. Lungs clear.  Heart: RRR  Extremities: Ambulatory with antalgic gait, no edema  Neurologic: Awake, alert, oriented, speech clear and coherent  Skin: No jaundice, warm   Psychiatric: Cooperative, normal affect     ASSESSMENT AND PLAN:     ICD-10-CM    1. Chronic hip pain, bilateral  M25.551 Refer to [facility]    M25.552 XR Hips Bilateral w Pelvis 2 Views    G89.29 He states he does not want to get physical therapy at this time. Recommended to continue weight loss.   2. Essential hypertension  I10 LIPID PANEL  Stable. Continue losartan and amlodipine.   3. Hyperlipidemia LDL goal <130  E78.5 LIPID PANEL  Stable. Continue Lovaza and atorvastatin.   4. Chronic musculoskeletal pain  M79.18 XR Hips Bilateral w Pelvis 2 Views    G89.29 XR LUMBAR SPINE SERIES   5. Severe obesity (BMI 35.0-35.9 with comorbidity) (CMS HCC)  E66.01  Z68.35 He has lost some weight.  Recommended to continue weight loss.   6. Colon cancer screening  Z12.11 [facility] REQUEST (AMB) - colonoscopy   7. Prediabetes  R73.03 HGA1C (HEMOGLOBIN A1C WITH EST AVG GLUCOSE)  Follow a healthy ADA diet, exercise as tolerated and continue weight loss.     Data reviewed:   Lab Results   Component Value Date/Time    HA1C 5.7 08/16/2019 12:00 AM    BUN 11 11/07/2019 04:45 PM    CREATININE 1.08 11/07/2019 04:45 PM    GFR >60 11/07/2019 04:45 PM    GLUCOSEFAST 114 03/05/2019 12:00 AM    SODIUM 138 11/07/2019 04:45 PM    POTASSIUM 3.5 11/07/2019 04:45 PM    WBC 10.6 11/07/2019 04:45 PM    HGB 16.0 11/07/2019 04:45 PM    HCT 46.3 11/07/2019 04:45 PM    PLTCNT 251 11/07/2019 04:45 PM    AST 14 11/07/2019 04:45 PM    ALT 22 11/07/2019 04:45 PM    CHOLESTEROL 119 03/05/2019 12:00 AM    HDLCHOL 33 03/05/2019 12:00 AM    LDLCHOL 55 03/05/2019 12:00 AM    TRIG 105 08/16/2019 12:00 AM    TSH 0.562 06/11/2019 02:06 PM    MAGNESIUM 1.8 03/05/2019 12:00 AM     Orders Placed This Encounter   . XR Hips  Bilateral w Pelvis  2 Views   . XR LUMBAR SPINE SERIES   . LIPID PANEL   . HGA1C (HEMOGLOBIN A1C WITH EST AVG GLUCOSE)   . Refer to [facility]   . [facility] REQUEST (AMB) - colonoscopy     Recheck blood work prior to next office visit. Above plan was discussed with the patient and patient verbalized understanding. Patient agreed with above treatment and plan. Questions answered to patient's satisfaction.  Follow-up in September which is already scheduled, sooner should new symptoms or problems arise.   He  was advised to contact the office if with any questions or concerns.    Jarome Lamas. Shona Simpson, MD  Note: This chart was transcribed using voice recognition software and may contain unintended word substitution or minor typographical errors.

## 2019-12-24 NOTE — Nursing Note (Signed)
Patient is here for hip pain on both sides. Patient states he has had it for awhile but has been increasing. Patient had coivd vaccine, chart updated.

## 2019-12-25 NOTE — Telephone Encounter (Signed)
Last Visit:12/24/2019     Upcoming appointments: 02/06/2020           Erskine Speed, RN  12/25/2019, 09:27

## 2019-12-26 ENCOUNTER — Other Ambulatory Visit (INDEPENDENT_AMBULATORY_CARE_PROVIDER_SITE_OTHER): Payer: Self-pay | Admitting: NURSE PRACTITIONER

## 2019-12-27 NOTE — Telephone Encounter (Signed)
Last Visit:12/24/2019     Upcoming appointments: 02/06/2020           Rito Ehrlich, RN  12/27/2019, 08:19

## 2020-01-01 ENCOUNTER — Other Ambulatory Visit (INDEPENDENT_AMBULATORY_CARE_PROVIDER_SITE_OTHER): Payer: Self-pay | Admitting: PHYSICIAN ASSISTANT

## 2020-01-01 DIAGNOSIS — F5104 Psychophysiologic insomnia: Secondary | ICD-10-CM

## 2020-01-01 NOTE — Telephone Encounter (Signed)
Last Visit:12/24/2019     Upcoming appointments: 02/06/2020           Erskine Speed, RN  01/01/2020, 11:46

## 2020-01-05 ENCOUNTER — Other Ambulatory Visit (INDEPENDENT_AMBULATORY_CARE_PROVIDER_SITE_OTHER): Payer: Self-pay | Admitting: PHYSICIAN ASSISTANT

## 2020-01-05 DIAGNOSIS — J4541 Moderate persistent asthma with (acute) exacerbation: Secondary | ICD-10-CM

## 2020-01-07 ENCOUNTER — Other Ambulatory Visit (INDEPENDENT_AMBULATORY_CARE_PROVIDER_SITE_OTHER): Payer: Self-pay | Admitting: PHYSICIAN ASSISTANT

## 2020-01-07 DIAGNOSIS — J4541 Moderate persistent asthma with (acute) exacerbation: Secondary | ICD-10-CM

## 2020-01-07 NOTE — Telephone Encounter (Signed)
Last Visit:12/24/2019    Upcoming appointments: 02/06/2020        Rito Ehrlich, RN  01/07/2020, 09:17  ]

## 2020-01-07 NOTE — Telephone Encounter (Signed)
Last Visit:12/24/2019     Upcoming appointments: 02/06/2020        Erskine Speed, RN  01/07/2020, 10:14

## 2020-01-20 ENCOUNTER — Other Ambulatory Visit (INDEPENDENT_AMBULATORY_CARE_PROVIDER_SITE_OTHER): Payer: Self-pay | Admitting: Family Medicine

## 2020-01-20 DIAGNOSIS — F5104 Psychophysiologic insomnia: Secondary | ICD-10-CM

## 2020-01-23 NOTE — Telephone Encounter (Signed)
Last Visit:12/24/2019     Upcoming appointments: 02/06/2020           Erskine Speed, RN  01/23/2020, 12:19

## 2020-01-30 ENCOUNTER — Other Ambulatory Visit (INDEPENDENT_AMBULATORY_CARE_PROVIDER_SITE_OTHER): Payer: Self-pay | Admitting: Family Medicine

## 2020-01-30 ENCOUNTER — Ambulatory Visit (HOSPITAL_COMMUNITY): Payer: Self-pay | Admitting: ORTHOPAEDIC SURGERY

## 2020-01-30 DIAGNOSIS — F4310 Post-traumatic stress disorder, unspecified: Secondary | ICD-10-CM

## 2020-01-30 NOTE — Telephone Encounter (Signed)
Last Visit:02/06/20     Upcoming appointments: Visit date not found           Tyler Deis, [place]  01/30/2020, 11:48

## 2020-02-02 ENCOUNTER — Other Ambulatory Visit (INDEPENDENT_AMBULATORY_CARE_PROVIDER_SITE_OTHER): Payer: Self-pay | Admitting: PHYSICIAN ASSISTANT

## 2020-02-04 ENCOUNTER — Ambulatory Visit (HOSPITAL_BASED_OUTPATIENT_CLINIC_OR_DEPARTMENT_OTHER)
Admission: RE | Admit: 2020-02-04 | Discharge: 2020-02-04 | Disposition: A | Discharge status: Home / Self Care | Payer: Medicare Other | Source: Ambulatory Visit | Attending: Family Medicine | Admitting: Family Medicine

## 2020-02-04 ENCOUNTER — Ambulatory Visit
Admission: RE | Admit: 2020-02-04 | Discharge: 2020-02-04 | Disposition: A | Discharge status: Home / Self Care | Payer: Medicare Other | Source: Ambulatory Visit | Attending: Family Medicine | Admitting: Family Medicine

## 2020-02-04 ENCOUNTER — Ambulatory Visit (HOSPITAL_BASED_OUTPATIENT_CLINIC_OR_DEPARTMENT_OTHER): Payer: Medicare Other

## 2020-02-04 ENCOUNTER — Telehealth (INDEPENDENT_AMBULATORY_CARE_PROVIDER_SITE_OTHER): Payer: Self-pay | Admitting: PHYSICIAN ASSISTANT

## 2020-02-04 ENCOUNTER — Other Ambulatory Visit: Payer: Self-pay

## 2020-02-04 DIAGNOSIS — M170 Bilateral primary osteoarthritis of knee: Secondary | ICD-10-CM

## 2020-02-04 DIAGNOSIS — E785 Hyperlipidemia, unspecified: Secondary | ICD-10-CM

## 2020-02-04 DIAGNOSIS — E559 Vitamin D deficiency, unspecified: Secondary | ICD-10-CM | POA: Insufficient documentation

## 2020-02-04 DIAGNOSIS — G8929 Other chronic pain: Secondary | ICD-10-CM

## 2020-02-04 DIAGNOSIS — F5104 Psychophysiologic insomnia: Secondary | ICD-10-CM | POA: Insufficient documentation

## 2020-02-04 DIAGNOSIS — R7303 Prediabetes: Secondary | ICD-10-CM | POA: Insufficient documentation

## 2020-02-04 DIAGNOSIS — J4540 Moderate persistent asthma, uncomplicated: Secondary | ICD-10-CM

## 2020-02-04 DIAGNOSIS — I10 Essential (primary) hypertension: Secondary | ICD-10-CM | POA: Insufficient documentation

## 2020-02-04 DIAGNOSIS — E876 Hypokalemia: Secondary | ICD-10-CM

## 2020-02-04 DIAGNOSIS — M7918 Myalgia, other site: Secondary | ICD-10-CM

## 2020-02-04 LAB — COMPREHENSIVE METABOLIC PNL, FASTING
ALBUMIN/GLOBULIN RATIO: 1.6 (ref >=1.0)
ALBUMIN: 4.1 g/dL (ref 3.5–5.2)
ALKALINE PHOSPHATASE: 68 U/L (ref 41–133)
ALT (SGPT): 18 U/L (ref 0–55)
ANION GAP: 7 mmol/L (ref 6–15)
AST (SGOT): 16 U/L (ref 5–34)
BILIRUBIN TOTAL: 1.1 mg/dL (ref 0.2–1.2)
BUN: 13 mg/dL (ref 7–21)
CALCIUM: 9 mg/dL (ref 8.0–10.6)
CHLORIDE: 98 mmol/L (ref 98–107)
CO2 TOTAL: 35 mmol/L — ABNORMAL HIGH (ref 21–32)
CREATININE: 1.13 mg/dL (ref 0.80–1.60)
ESTIMATED GFR: >60 mL/min/{1.73_m2}
GLUCOSE: 113 mg/dL — ABNORMAL HIGH (ref 70–100)
POTASSIUM: 3 mmol/L — CL (ref 3.3–5.1)
PROTEIN TOTAL: 6.6 g/dL (ref 6.4–8.3)
SODIUM: 140 mmol/L (ref 136–146)

## 2020-02-04 LAB — CBC
HCT: 44 % (ref 38.9–52.0)
HGB: 15.3 g/dL (ref 13.4–17.5)
MCH: 29.3 pg (ref 26.0–32.0)
MCHC: 34.8 g/dL (ref 31.0–35.5)
MCV: 84.3 fL (ref 78.0–100.0)
MPV: 11.4 fL (ref 8.7–12.5)
PLATELETS: 251 10*3/uL (ref 150–400)
RBC: 5.22 10*6/uL (ref 4.50–6.10)
RDW-CV: 13 % (ref 11.5–15.5)
WBC: 8.6 10*3/uL (ref 3.7–11.0)

## 2020-02-04 LAB — LIPID PANEL
CHOL/HDL RATIO: 4.1
CHOLESTEROL: 145 mg/dL (ref 75–200)
HDL CHOL: 35 mg/dL (ref 27–58)
LDL CALC: 78 mg/dL (ref 0–99)
TRIGLYCERIDES: 158 mg/dL — ABNORMAL HIGH (ref 28–153)
VLDL CALC: 32 mg/dL (ref 0–50)

## 2020-02-04 MED ORDER — POTASSIUM CHLORIDE ER 20 MEQ TABLET,EXTENDED RELEASE(PART/CRYST)
20.0000 meq | ORAL_TABLET | Freq: Every day | ORAL | 0 refills | Status: DC
Start: 2020-02-04 — End: 2020-02-18

## 2020-02-04 NOTE — Telephone Encounter (Signed)
Patients potassium is low. I did try to contact patient and told him to call on-call line via voicemail as he did not answer his phone. Please let patient know that potassium is low and will need to supplement and recheck in 1 week. Will send over potassium supplement to the pharmacy. He should report to the ED with any chest pain, palpitations, muscle aches.

## 2020-02-04 NOTE — Telephone Encounter (Signed)
Left vm waiting on call back

## 2020-02-05 LAB — HGA1C (HEMOGLOBIN A1C WITH EST AVG GLUCOSE)
EAG (MG/DL): 123 (calc)
EAG (MMOL/L): 6.8 (calc)
HEMOGLOBIN A1C: 5.9 % of total Hgb — ABNORMAL HIGH (ref <5.7)

## 2020-02-05 LAB — VITAMIN D 25 TOTAL: VITAMIN D,25-OH,TOTAL,IA: 42 ng/mL (ref 30–100)

## 2020-02-05 NOTE — Telephone Encounter (Signed)
Patient aware. Verbalized understanding.

## 2020-02-05 NOTE — Telephone Encounter (Signed)
Can we please try to reach patient again about this? Can also try wife. Thanks.

## 2020-02-05 NOTE — Telephone Encounter (Signed)
Left message on machine for patient to call the office.

## 2020-02-05 NOTE — Telephone Encounter (Signed)
Last Visit:12/24/2019     Upcoming appointments: Visit date not found           Erskine Speed, RN  02/05/2020, 15:22

## 2020-02-06 ENCOUNTER — Encounter (INDEPENDENT_AMBULATORY_CARE_PROVIDER_SITE_OTHER): Payer: Self-pay | Admitting: Family Medicine

## 2020-02-07 ENCOUNTER — Ambulatory Visit: Payer: Medicare Other | Attending: ORTHOPAEDIC SURGERY | Admitting: ORTHOPAEDIC SURGERY

## 2020-02-07 ENCOUNTER — Encounter (HOSPITAL_COMMUNITY): Payer: Self-pay | Admitting: ORTHOPAEDIC SURGERY

## 2020-02-07 ENCOUNTER — Encounter (INDEPENDENT_AMBULATORY_CARE_PROVIDER_SITE_OTHER): Payer: Self-pay | Admitting: Family Medicine

## 2020-02-07 VITALS — Temp 97.2°F | Ht 69.0 in | Wt 260.0 lb

## 2020-02-07 DIAGNOSIS — M7062 Trochanteric bursitis, left hip: Secondary | ICD-10-CM

## 2020-02-07 DIAGNOSIS — M25551 Pain in right hip: Secondary | ICD-10-CM | POA: Insufficient documentation

## 2020-02-07 DIAGNOSIS — G8929 Other chronic pain: Secondary | ICD-10-CM | POA: Insufficient documentation

## 2020-02-07 DIAGNOSIS — M7061 Trochanteric bursitis, right hip: Secondary | ICD-10-CM | POA: Insufficient documentation

## 2020-02-07 DIAGNOSIS — M25552 Pain in left hip: Secondary | ICD-10-CM | POA: Insufficient documentation

## 2020-02-07 MED ORDER — LIDOCAINE HCL 10 MG/ML (1 %) INJECTION SOLUTION
4.0000 mL | Freq: Once | INTRAMUSCULAR | 0 refills | Status: DC
Start: 2020-02-07 — End: 2020-02-18

## 2020-02-07 MED ORDER — NAPROXEN 500 MG TABLET
500.0000 mg | ORAL_TABLET | Freq: Two times a day (BID) | ORAL | 3 refills | Status: DC
Start: 2020-02-07 — End: 2020-08-01

## 2020-02-07 MED ORDER — METHYLPREDNISOLONE ACETATE 40 MG/ML SUSPENSION FOR INJECTION
40.0000 mg | Freq: Once | INTRAMUSCULAR | 0 refills | Status: DC
Start: 2020-02-07 — End: 2020-02-18

## 2020-02-07 NOTE — Procedures (Signed)
ORTHOPEDICS, [facility]  [place] [address]  Operated by [facility]  Procedure Note    Name: Steve Young MRN:  [id number]   Date: 02/07/2020 Age: 59 y.o.           Joint Asp/Inj    Date/Time: 02/07/2020 3:58 PM  Performed by: Josem Kaufmann, MD  Authorized by: Josem Kaufmann, MD     Consent:     Consent obtained:  Verbal    Consent given by:  Patient    Risks discussed:  Bleeding, infection, pain, nerve damage and incomplete drainage    Alternatives discussed:  No treatment  Location:     Location:  Hip    Hip joint: Left and right greater trochanter.  Anesthesia (see MAR for exact dosages):     Anesthesia method:  None  Procedure details:     Needle gauge:  22 G    Ultrasound guidance: yes      Approach:  Lateral    Steroid injected: yes      Specimen collected: no    Post-procedure details:     Dressing:  Adhesive bandage    Patient tolerance of procedure:  Tolerated with difficulty  Comments:      Medication: Depomedrol, 40 mg/ml, 23ml used  Route: Needle injection, Interarticular  Site:  The bilateral greater trochanter  NDC: 8828-0034-91  Manufacturer: [facility]

## 2020-02-07 NOTE — Nursing Note (Signed)
02/07/20 1500 02/07/20 1544   Medication Administration   Medication  DepoMedrol DepoMedrol   LOT # [id number]   Expiration date 09/14/21 09/14/21

## 2020-02-07 NOTE — Progress Notes (Signed)
[place], [facility]  [place] [address]      Name: Steve Young  MRN: [id number]    Patient ID: Steve Young is an 59 y.o. male.  Chief Complaint:     Chief Complaint   Patient presents with   . Hip Pain     bl hip         HPI/Subjective:    Patient is a very pleasant a 58 year old gentleman who has been retired for considerable period of time.  He has bilateral trochanteric bursitis.  The pain is directly over the lateral aspect of the hip.  This is pretty symmetric bilaterally.  He has not been walking with an antalgic gait.  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  I he is now been retired considerable period of time.  He feels pain when he does move his hips around.  He does have multiple chronic pain issues which include arthralgia of multiple different joints.  On this occasion he was hoping there was something we can do for his hip pain.    Past Medical History:   Diagnosis Date   . Arthralgia of hip 03/21/2018   . Arthralgia of right upper arm 03/30/2016   . Asthma    . Cervicalgia 03/21/2018   . Chronic abdominal pain 10/04/2018   . Chronic back pain 03/21/2018   . Chronic chest pain 03/21/2018   . Chronic insomnia    . Constipation in male 09/13/2017   . Esophageal reflux    . 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 Hepatic lesion 10/04/2018   . Hyperlipidemia    . Hypertension    . Impingement syndrome of left shoulder 12/28/2017   . Mild persistent asthma without complication 02/06/3006   . NAFL (nonalcoholic fatty liver)    . Normal cardiac stress test 03/21/2018   . Nosebleed    . Obesity    . 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 Partial tear of common extensor tendon of elbow 12/13/2017   . Pituitary tumor    . Pre-diabetes    . Rib pain 10/04/2018   . Sexual dysfunction    . Tear of talofibular ligament 05/30/2013    RIGHT   . Vitamin D deficiency          Past Surgical History:    Procedure Laterality Date   . COLONOSCOPY     . ELBOW SURGERY Left 12/2017   . HX OTHER  2004     SPINAL FUSION,ANT,EA ADNL LEVEL c3-c7   . HX OTHER Right     right great toe   . HX SHOULDER SURGERY Bilateral    . NOSE SURGERY           Family Medical History:     Problem Relation (Age of Onset)    Cancer Mother, Sister             Current Outpatient Medications   Medication Sig   . albuterol sulfate (PROVENTIL OR VENTOLIN OR PROAIR) 90 mcg/actuation Inhalation HFA Aerosol Inhaler inhale 1 puff by mouth every 4 hours if needed wheezing   . amLODIPine (NORVASC) 10 mg Oral Tablet take 1 tablet by mouth once daily   . aspirin (ECOTRIN) 81 mg Oral Tablet, Delayed Release (E.C.) take 1 tablet by mouth once daily   . atorvastatin (LIPITOR) 80 mg Oral Tablet take 1 tablet by mouth at bedtime   .  chlorthalidone (HYGROTON) 25 mg Oral Tablet take 1 tablet by mouth once daily   . cholecalciferol, vitamin D3, 125 mcg (5,000 unit) Oral Capsule take 1 capsule by mouth once daily   . diclofenac sodium (VOLTAREN) 1 % Gel by Apply Topically route Four times a day - before meals and bedtime   . folic acid (FOLVITE) 1 mg Oral Tablet take 1 tablet by mouth once daily   . gabapentin (NEURONTIN) 600 mg Oral Tablet take 1 tablet by mouth three times a day   . Ibuprofen (MOTRIN) 800 mg Oral Tablet take 1 tablet by mouth three times a day if needed for pain take with food   . lidocaine 10 mg/mL (1 %) Injection Solution 4 mL (40 mg total) by Injection route One time for 1 dose   . lidocaine 10 mg/mL (1 %) Injection Solution 4 mL (40 mg total) by Injection route One time for 1 dose   . losartan (COZAAR) 100 mg Oral Tablet take 1 tablet by mouth once daily   . lubiprostone (AMITIZA) 24 mcg Oral Capsule Take 1 Cap (24 mcg total) by mouth Twice daily   . magnesium oxide (MAG-OX) 400 mg (241.3 mg magnesium) Oral Tablet Take 400 mg by mouth   . methocarbamol (ROBAXIN) 500 mg Oral Tablet    . methylPREDNISolone acetate (DEPO-MEDROL) 40 mg/mL  Injection Suspension 1 mL (40 mg total) by Intra-articular route One time for 1 dose   . methylPREDNISolone acetate (DEPO-MEDROL) 40 mg/mL Injection Suspension 1 mL (40 mg total) by Intra-articular route One time for 1 dose   . mupirocin (BACTROBAN) 2 % Ointment by Apply Topically route Three times a day   . naproxen (NAPROSYN) 500 mg Oral Tablet Take 1 Tablet (500 mg total) by mouth Twice daily with food   . NARCAN 4 mg/actuation Nasal Spray, Non-Aerosol INSTILL 1 SPRAY NASALLY AS NEEDED PER CDC RECOMMENDATIONS   . omega-3 fatty acid (LOVAZA) 1 gram Oral Capsule take 2 capsules by mouth twice a day   . omeprazole (PRILOSEC) 40 mg Oral Capsule, Delayed Release(E.C.) take 1 capsule by mouth once daily   . oxyCODONE-acetaminophen (PERCOCET) 10-325 mg Oral Tablet take 1 tablet by mouth three times a day if needed for pain   . PARoxetine (PAXIL) 10 mg Oral Tablet take 1 tablet by mouth once daily   . potassium chloride (K-DUR) 20 mEq Oral Tab Sust.Rel. Particle/Crystal Take 1 Tablet (20 mEq total) by mouth Once a day for 5 days   . Sildenafil (VIAGRA) 25 mg Oral Tablet take 1 tablet by mouth every 24 hours if needed   . SYMBICORT 160-4.5 mcg/actuation Inhalation HFA Aerosol Inhaler inhale 2 puffs by mouth twice a day Rinse mouth after use   . zafirlukast (ACCOLATE) 20 mg Oral Tablet take 1 tablet by mouth twice a day before meals   . zolpidem (AMBIEN) 10 mg Oral Tablet take 1 tablet by mouth at bedtime if needed     No Known Allergies   Social History     Substance and Sexual Activity   Alcohol Use Not Currently     Social History     Tobacco Use   Smoking Status Never Smoker   Smokeless Tobacco Never Used     Social History     Substance and Sexual Activity   Drug Use Never       Review of Systems:  Constitutional: Negative for activity change, appetite change, diaphoresis and fatigue.   HENT: Negative for hearing loss, nosebleeds,  postnasal drip, rhinorrhea, sinus pain and sore throat.    Eyes: Negative for  photophobia, discharge and redness.   Respiratory: Negative for cough, choking, chest tightness, shortness of breath and wheezing.    Cardiovascular: Negative for chest pain, palpitations and leg swelling.   Gastrointestinal: Negative for abdominal distention, constipation, diarrhea, nausea and vomiting.   Genitourinary: Negative for difficulty urinating, dysuria, hematuria and urgency.   Neurological: Negative for dizziness, seizures, light-headedness and numbness.   Psychiatric/Behavioral: Negative for agitation, confusion, dysphoric mood and sleep disturbance.     Physical Exam  Vitals:    02/07/20 1528   Temp: 36.2 C (97.2 F)   Weight: 118 kg (260 lb)   Height: 1.753 m (5\' 9" )   BMI: 38.48      Constitutional:       General: She is not in acute distress.     Appearance: Normal appearance. She is not ill-appearing or diaphoretic.   Eyes:      Extraocular Movements: Extraocular movements intact.      Pupils: Pupils are equal, round, and reactive to light.   Cardiovascular:      Rate and Rhythm: Normal rate and regular rhythm.   Pulmonary:      Effort: Pulmonary effort is normal.      Breath sounds: Normal breath sounds.   Abdominal:      General: Abdomen is flat. There is no distension.      Palpations: Abdomen is soft.      Tenderness: There is no abdominal tenderness.   Skin:     Coloration: Skin is not jaundiced.      Findings: No Rash. No lesion.   Neurological:      Mental Status: She is alert.   Musculoskeletal:      Hip motion is excellent.  He flexes both to 120 or 130. He has no contracture and no tenderness with rotation.  He is exquisitely tender over the greater trochanter directly or with active abduction.  Straight leg raising is negative.  He has normal motor reflex and sensory testing L3 through S1.  Specifically vascular examination is normal.  Specifically there are no neurologic findings.    Assessment:  1. Trochanteric bursitis of both hips    2. Chronic hip pain, bilateral        I think it  is reasonable to proceed with corticosteroid injections into both greater trochanters.  We will see how that does over time.  Physical therapy and anti-inflammatory.  We did briefly discuss the surgical management which is something he wanted to talk about.  I gave him the typical odd switch our that surgery has about a 50% chance of relieving all symptoms from recalcitrant trochanteric bursitis.  I recommended that we try to manage this non operatively for considerable period of time before jumping straight to surgery.  He does have significant medical issues which include his cardiovascular system in his pulmonary system where he is typically on both amlodipine and albuterol.  At this point were going to try some naproxen see if he can tolerate this work with physical therapy and then reassess.  He had been on Neurontin and that has been discontinued.    Plan:  Orders Placed This Encounter   . AMB CONSULT/REFERRAL PHYS THERAPY- EXTERNAL   . lidocaine 10 mg/mL (1 %) Injection Solution   . lidocaine 10 mg/mL (1 %) Injection Solution   . methylPREDNISolone acetate (DEPO-MEDROL) 40 mg/mL Injection Suspension   . methylPREDNISolone acetate (DEPO-MEDROL) 40  mg/mL Injection Suspension   . naproxen (NAPROSYN) 500 mg Oral Tablet       Josem Kaufmann, MD    Wynonia Hazard, MD  [address]  [place] [address]

## 2020-02-08 ENCOUNTER — Other Ambulatory Visit (INDEPENDENT_AMBULATORY_CARE_PROVIDER_SITE_OTHER): Payer: Self-pay | Admitting: PHYSICIAN ASSISTANT

## 2020-02-08 DIAGNOSIS — E876 Hypokalemia: Secondary | ICD-10-CM

## 2020-02-08 NOTE — Telephone Encounter (Signed)
Patient it to have follow up potassium completed before any further potassium supplements to see where his level is at. Please have him get lab completed so we can determine what next steps are. Thanks.

## 2020-02-08 NOTE — Telephone Encounter (Signed)
Last Visit:12/24/2019     Upcoming appointments: 02/18/2020           Steve Young, [place]  02/08/2020, 13:20

## 2020-02-09 ENCOUNTER — Other Ambulatory Visit (INDEPENDENT_AMBULATORY_CARE_PROVIDER_SITE_OTHER): Payer: Self-pay | Admitting: Family Medicine

## 2020-02-09 DIAGNOSIS — F5104 Psychophysiologic insomnia: Secondary | ICD-10-CM

## 2020-02-11 NOTE — Telephone Encounter (Signed)
Last Visit:12/24/2019     Upcoming appointments: 02/18/2020           Erskine Speed, RN  02/11/2020, 08:55

## 2020-02-11 NOTE — Telephone Encounter (Signed)
Left message on machine for patient

## 2020-02-12 ENCOUNTER — Ambulatory Visit: Payer: Medicare Other | Attending: PHYSICIAN ASSISTANT

## 2020-02-12 ENCOUNTER — Other Ambulatory Visit: Payer: Self-pay

## 2020-02-12 ENCOUNTER — Telehealth (INDEPENDENT_AMBULATORY_CARE_PROVIDER_SITE_OTHER): Payer: Self-pay | Admitting: Family Medicine

## 2020-02-12 ENCOUNTER — Other Ambulatory Visit (INDEPENDENT_AMBULATORY_CARE_PROVIDER_SITE_OTHER): Payer: Self-pay | Admitting: PHYSICIAN ASSISTANT

## 2020-02-12 DIAGNOSIS — E876 Hypokalemia: Secondary | ICD-10-CM

## 2020-02-12 LAB — MAGNESIUM: MAGNESIUM: 2.2 mg/dL (ref 1.5–2.5)

## 2020-02-12 LAB — POTASSIUM: POTASSIUM: 2.9 mmol/L — CL (ref 3.3–5.1)

## 2020-02-12 MED ORDER — POTASSIUM CHLORIDE ER 20 MEQ TABLET,EXTENDED RELEASE(PART/CRYST)
20.0000 meq | ORAL_TABLET | Freq: Two times a day (BID) | ORAL | 0 refills | Status: DC
Start: 2020-02-12 — End: 2020-02-18

## 2020-02-12 NOTE — Telephone Encounter (Signed)
Received a call from the lab at [facility] that pt's K is low at 2.9.  I did call the patient and he states that someone from the office already called him today and put him on potassium. He would however, like to know why this keeps happening and if he needs to be on K chronically. Please f/u with pt

## 2020-02-13 ENCOUNTER — Other Ambulatory Visit (INDEPENDENT_AMBULATORY_CARE_PROVIDER_SITE_OTHER): Payer: Self-pay | Admitting: PHYSICIAN ASSISTANT

## 2020-02-13 NOTE — Telephone Encounter (Signed)
Last Visit:12/24/2019     Upcoming appointments: 02/18/2020           Steve Young, [place]  02/13/2020, 10:20

## 2020-02-14 ENCOUNTER — Other Ambulatory Visit (INDEPENDENT_AMBULATORY_CARE_PROVIDER_SITE_OTHER): Payer: Self-pay | Admitting: Family Medicine

## 2020-02-14 ENCOUNTER — Other Ambulatory Visit (INDEPENDENT_AMBULATORY_CARE_PROVIDER_SITE_OTHER): Payer: Self-pay | Admitting: NURSE PRACTITIONER

## 2020-02-14 NOTE — Telephone Encounter (Signed)
Last Visit:12/24/2019     Upcoming appointments: 02/18/2020        Erskine Speed, RN  02/14/2020, 10:58

## 2020-02-14 NOTE — Telephone Encounter (Signed)
Last Visit:12/24/2019     Upcoming appointments: 02/18/2020           Erskine Speed, RN  02/14/2020, 10:58

## 2020-02-18 ENCOUNTER — Ambulatory Visit (INDEPENDENT_AMBULATORY_CARE_PROVIDER_SITE_OTHER): Payer: Medicare Other | Admitting: Family Medicine

## 2020-02-18 ENCOUNTER — Ambulatory Visit: Payer: Medicare Other | Attending: PHYSICIAN ASSISTANT

## 2020-02-18 ENCOUNTER — Other Ambulatory Visit: Payer: Self-pay

## 2020-02-18 ENCOUNTER — Encounter (INDEPENDENT_AMBULATORY_CARE_PROVIDER_SITE_OTHER): Payer: Self-pay | Admitting: Family Medicine

## 2020-02-18 VITALS — BP 130/80 | HR 87 | Temp 97.1°F | Ht 69.0 in | Wt 224.6 lb

## 2020-02-18 DIAGNOSIS — E876 Hypokalemia: Secondary | ICD-10-CM | POA: Insufficient documentation

## 2020-02-18 DIAGNOSIS — Z791 Long term (current) use of non-steroidal anti-inflammatories (NSAID): Secondary | ICD-10-CM

## 2020-02-18 DIAGNOSIS — E782 Mixed hyperlipidemia: Secondary | ICD-10-CM

## 2020-02-18 DIAGNOSIS — F5104 Psychophysiologic insomnia: Secondary | ICD-10-CM

## 2020-02-18 DIAGNOSIS — Z79899 Other long term (current) drug therapy: Secondary | ICD-10-CM

## 2020-02-18 DIAGNOSIS — I10 Essential (primary) hypertension: Secondary | ICD-10-CM

## 2020-02-18 DIAGNOSIS — R7303 Prediabetes: Secondary | ICD-10-CM | POA: Insufficient documentation

## 2020-02-18 DIAGNOSIS — E785 Hyperlipidemia, unspecified: Secondary | ICD-10-CM | POA: Insufficient documentation

## 2020-02-18 DIAGNOSIS — E559 Vitamin D deficiency, unspecified: Secondary | ICD-10-CM

## 2020-02-18 DIAGNOSIS — F4310 Post-traumatic stress disorder, unspecified: Secondary | ICD-10-CM

## 2020-02-18 DIAGNOSIS — J4540 Moderate persistent asthma, uncomplicated: Secondary | ICD-10-CM

## 2020-02-18 DIAGNOSIS — Z7982 Long term (current) use of aspirin: Secondary | ICD-10-CM

## 2020-02-18 HISTORY — DX: Prediabetes: R73.03

## 2020-02-18 LAB — LIPID PANEL
CHOL/HDL RATIO: 4.6
CHOLESTEROL: 210 mg/dL — ABNORMAL HIGH (ref 75–200)
HDL CHOL: 46 mg/dL (ref 27–58)
LDL CALC: 140 mg/dL — ABNORMAL HIGH (ref 0–99)
TRIGLYCERIDES: 121 mg/dL (ref 28–153)
VLDL CALC: 24 mg/dL (ref 0–50)

## 2020-02-18 LAB — POTASSIUM: POTASSIUM: 3.9 mmol/L (ref 3.3–5.1)

## 2020-02-18 MED ORDER — SPIRIVA RESPIMAT 1.25 MCG/ACTUATION SOLUTION FOR INHALATION
2.5000 ug | Freq: Every day | RESPIRATORY_TRACT | 3 refills | Status: DC
Start: 2020-02-18 — End: 2020-10-21

## 2020-02-18 NOTE — Nursing Note (Signed)
Patient is here for a follow up. Patient has no complaints. Patient has had COVID-19 vaccine and flu shot. Patient states all medications are the same.

## 2020-02-18 NOTE — Progress Notes (Signed)
Chief Complaint   Patient presents with   . Follow-up   . Hypertension   . Hyperlipidemia   . Asthma   . Insomnia   . Vitamin D Deficiency     Last office visit with the provider:12/24/2019     SUBJECTIVE:  Steve Young is a 59 y.o. male,White. Patient states he has been using her albuterol inhaler more frequently because of shortness of breath.  He states he is also having intermittent dizziness with sudden movements.  He is not feeling dizzy today. He does not have the following symptoms today: fever, chills, cough, nasal congestion, sore throat, abdominal pain, headache, eye pain or irritation, epistaxis, chest discomfort, stroke symptoms, wheezing or palpitations. Side effects of medications: no current medication side effects.     Current Outpatient Medications   Medication Sig   . albuterol sulfate (PROVENTIL OR VENTOLIN OR PROAIR) 90 mcg/actuation Inhalation HFA Aerosol Inhaler inhale 1 puff by mouth every 4 hours if needed wheezing   . amLODIPine (NORVASC) 10 mg Oral Tablet take 1 tablet by mouth once daily   . aspirin (ECOTRIN) 81 mg Oral Tablet, Delayed Release (E.C.) take 1 tablet by mouth once daily   . atorvastatin (LIPITOR) 80 mg Oral Tablet take 1 tablet by mouth at bedtime   . chlorthalidone (HYGROTON) 25 mg Oral Tablet take 1 tablet by mouth once daily   . cholecalciferol, vitamin D3, 125 mcg (5,000 unit) Oral Capsule take 1 capsule by mouth once daily   . diclofenac sodium (VOLTAREN) 1 % Gel by Apply Topically route Four times a day - before meals and bedtime   . folic acid (FOLVITE) 1 mg Oral Tablet take 1 tablet by mouth once daily   . gabapentin (NEURONTIN) 600 mg Oral Tablet take 1 tablet by mouth three times a day   . Ibuprofen (MOTRIN) 800 mg Oral Tablet take 1 tablet by mouth three times a day if needed for pain take with food   . losartan (COZAAR) 100 mg Oral Tablet take 1 tablet by mouth once daily   . lubiprostone (AMITIZA) 24 mcg Oral Capsule Take 1 Cap (24 mcg total) by mouth  Twice daily   . magnesium oxide (MAG-OX) 400 mg (241.3 mg magnesium) Oral Tablet Take 400 mg by mouth   . methocarbamol (ROBAXIN) 500 mg Oral Tablet    . mupirocin (BACTROBAN) 2 % Ointment by Apply Topically route Three times a day   . naproxen (NAPROSYN) 500 mg Oral Tablet Take 1 Tablet (500 mg total) by mouth Twice daily with food   . NARCAN 4 mg/actuation Nasal Spray, Non-Aerosol INSTILL 1 SPRAY NASALLY AS NEEDED PER CDC RECOMMENDATIONS   . omega-3 fatty acid (LOVAZA) 1 gram Oral Capsule take 2 capsules by mouth twice a day   . omeprazole (PRILOSEC) 40 mg Oral Capsule, Delayed Release(E.C.) take 1 capsule by mouth once daily   . oxyCODONE-acetaminophen (PERCOCET) 10-325 mg Oral Tablet take 1 tablet by mouth three times a day if needed for pain   . PARoxetine (PAXIL) 10 mg Oral Tablet take 1 tablet by mouth once daily   . Sildenafil (VIAGRA) 25 mg Oral Tablet take 1 tablet by mouth every 24 hours if needed   . SYMBICORT 160-4.5 mcg/actuation Inhalation HFA Aerosol Inhaler inhale 2 puffs by mouth twice a day Rinse mouth after use   . zafirlukast (ACCOLATE) 20 mg Oral Tablet take 1 tablet by mouth twice a day before meals   . zolpidem (AMBIEN) 10 mg Oral Tablet  take 1 tablet by mouth at bedtime if needed      No Known Allergies    Past Surgical History:   Procedure Laterality Date   . COLONOSCOPY     . ELBOW SURGERY Left 12/2017   . HX OTHER  2004     SPINAL FUSION,ANT,EA ADNL LEVEL c3-c7   . HX OTHER Right     right great toe   . HX SHOULDER SURGERY Bilateral    . NOSE SURGERY       Social History     Tobacco Use   . Smoking status: Never Smoker   . Smokeless tobacco: Never Used   Vaping Use   . Vaping Use: Never used   Substance Use Topics   . Alcohol use: Not Currently   . Drug use: Never     OBJECTIVE:   The patient appears to be in no acute distress.  Vitals: BP 130/80 (Site: Left, Patient Position: Sitting, Cuff Size: Adult Large)   Pulse 87   Temp 36.2 C (97.1 F) (Thermal Scan)   Ht 1.753 m (5\' 9" )   Wt  102 kg (224 lb 9.6 oz)   SpO2 93%   BMI 33.17 kg/m     General: alert, no acute distress  Head: normocephalic, atraumatic  Eye: EOM intact, normal conjunctiva  Respiratory: Good diaphragmatic excursion. Lungs clear.  Heart: RRR  Extremities: Ambulatory, no edema  Neurologic: Awake, alert, oriented, speech clear and coherent  Skin: No jaundice, warm   Psychiatric: Cooperative, normal affect     ASSESSMENT AND PLAN:     ICD-10-CM    1. Essential hypertension  I10 CAROTID ARTERY DUPLEX  Stable.  Continue current medications.  His potassium level is now normal.  Recommended to continue weight loss.   2. Chronic insomnia  F51.04 Stable with sleep aid.  He has no suspected abuse of medication.   3. PTSD (post-traumatic stress disorder)  F43.10 Stable.  Continue Paxil.   4. Moderate persistent asthma without complication  P61.95 tiotropium bromide (SPIRIVA RESPIMAT) 1.25 mcg/actuation Inhalation Mist - new  His asthma is not well controlled.  Added Spiriva.   5. Mixed hyperlipidemia  E78.2 LIPID PANEL     CAROTID ARTERY DUPLEX  His cholesterol is getting worse.  He will continue lifestyle changes.  Continue atorvastatin and Lovaza.   6. Prediabetes  R73.03 HGA1C (HEMOGLOBIN A1C WITH EST AVG GLUCOSE)  Advised to continue watching sugar intake, no excessive carbohydrates, regular exercise as tolerated and continue weight loss recommended.   7. Vitamin D deficiency  E55.9 Improved.  Continue vitamin-D supplement.     Data reviewed:  Vitamin-D level on 02/04/2020 was normal at 42  Lab Results   Component Value Date/Time    HA1C 5.9 (H) 02/04/2020 01:05 PM    BUN 13 02/04/2020 01:05 PM    CREATININE 1.13 02/04/2020 01:05 PM    GFR >60 02/04/2020 01:05 PM    GLUCOSEFAST 114 03/05/2019 12:00 AM    SODIUM 140 02/04/2020 01:05 PM    POTASSIUM 3.9 02/18/2020 01:26 PM    WBC 8.6 02/04/2020 01:05 PM    HGB 15.3 02/04/2020 01:05 PM    HCT 44.0 02/04/2020 01:05 PM    PLTCNT 251 02/04/2020 01:05 PM    AST 16 02/04/2020 01:05 PM    ALT  18 02/04/2020 01:05 PM    CHOLESTEROL 210 (H) 02/18/2020 01:26 PM    HDLCHOL 46 02/18/2020 01:26 PM    LDLCHOL 140 (H) 02/18/2020 01:26 PM  TRIG 121 02/18/2020 01:26 PM    TSH 0.562 06/11/2019 02:06 PM    MAGNESIUM 2.2 02/12/2020 12:50 PM     Orders Placed This Encounter   . LIPID PANEL   . HGA1C (HEMOGLOBIN A1C WITH EST AVG GLUCOSE)   . CAROTID ARTERY DUPLEX   . tiotropium bromide (SPIRIVA RESPIMAT) 1.25 mcg/actuation Inhalation Mist     Recheck blood work in 6 months. Above plan was discussed with the patient and patient verbalized understanding. Patient agreed with above treatment and plan. Questions answered to patient's satisfaction. Risk, benefits, and alternatives to above treatment discussed with patient.   Follow-up in 6 months, sooner should new symptoms or problems arise.   He  was advised to contact the office if with any questions or concerns.    Jarome Lamas. Shona Simpson, MD  Note: This chart was transcribed using voice recognition software and may contain unintended word substitution or minor typographical errors.

## 2020-02-20 LAB — HGA1C (HEMOGLOBIN A1C WITH EST AVG GLUCOSE)
EAG (MG/DL): 120 (calc)
EAG (MMOL/L): 6.6 (calc)
HEMOGLOBIN A1C: 5.8 % of total Hgb — ABNORMAL HIGH (ref <5.7)

## 2020-02-28 ENCOUNTER — Ambulatory Visit
Admission: RE | Admit: 2020-02-28 | Discharge: 2020-02-28 | Disposition: A | Discharge status: Home / Self Care | Payer: Medicare Other | Source: Ambulatory Visit | Attending: Family Medicine | Admitting: Family Medicine

## 2020-02-28 ENCOUNTER — Other Ambulatory Visit: Payer: Self-pay

## 2020-02-28 DIAGNOSIS — E782 Mixed hyperlipidemia: Secondary | ICD-10-CM | POA: Insufficient documentation

## 2020-02-28 DIAGNOSIS — I10 Essential (primary) hypertension: Secondary | ICD-10-CM | POA: Insufficient documentation

## 2020-02-29 ENCOUNTER — Other Ambulatory Visit (INDEPENDENT_AMBULATORY_CARE_PROVIDER_SITE_OTHER): Payer: Self-pay | Admitting: Family Medicine

## 2020-02-29 DIAGNOSIS — F5104 Psychophysiologic insomnia: Secondary | ICD-10-CM

## 2020-02-29 NOTE — Telephone Encounter (Signed)
Last Visit:02/18/2020     Upcoming appointments: 08/18/2020           Erskine Speed, RN  02/29/2020, 10:26

## 2020-03-04 ENCOUNTER — Encounter (INDEPENDENT_AMBULATORY_CARE_PROVIDER_SITE_OTHER): Payer: Self-pay | Admitting: Family Medicine

## 2020-03-04 DIAGNOSIS — I6523 Occlusion and stenosis of bilateral carotid arteries: Secondary | ICD-10-CM | POA: Insufficient documentation

## 2020-03-04 HISTORY — DX: Occlusion and stenosis of bilateral carotid arteries: I65.23

## 2020-03-07 ENCOUNTER — Other Ambulatory Visit (INDEPENDENT_AMBULATORY_CARE_PROVIDER_SITE_OTHER): Payer: Self-pay | Admitting: Family Medicine

## 2020-03-07 NOTE — Telephone Encounter (Signed)
Last Visit:02/18/2020     Upcoming appointments: 08/18/2020           Tyler Deis, [place]  03/07/2020, 11:19

## 2020-03-10 ENCOUNTER — Other Ambulatory Visit (INDEPENDENT_AMBULATORY_CARE_PROVIDER_SITE_OTHER): Payer: Self-pay | Admitting: Family Medicine

## 2020-03-10 DIAGNOSIS — F5104 Psychophysiologic insomnia: Secondary | ICD-10-CM

## 2020-03-10 DIAGNOSIS — F4310 Post-traumatic stress disorder, unspecified: Secondary | ICD-10-CM

## 2020-03-10 NOTE — Telephone Encounter (Signed)
Last Visit:02/18/2020     Upcoming appointments: 08/18/2020           Erskine Speed, RN  03/10/2020, 15:20

## 2020-03-27 ENCOUNTER — Encounter (HOSPITAL_COMMUNITY): Payer: Self-pay | Admitting: ORTHOPAEDIC SURGERY

## 2020-04-14 ENCOUNTER — Other Ambulatory Visit (INDEPENDENT_AMBULATORY_CARE_PROVIDER_SITE_OTHER): Payer: Self-pay | Admitting: Family Medicine

## 2020-04-14 NOTE — Telephone Encounter (Signed)
Last Visit:02/18/2020     Upcoming appointments: 08/18/2020        Erskine Speed, RN  04/14/2020, 11:19

## 2020-04-23 ENCOUNTER — Other Ambulatory Visit (INDEPENDENT_AMBULATORY_CARE_PROVIDER_SITE_OTHER): Payer: Self-pay | Admitting: Family Medicine

## 2020-04-23 NOTE — Telephone Encounter (Signed)
Last Visit:02/18/2020     Upcoming appointments: 08/18/2020        Erskine Speed, RN  04/23/2020, 11:35

## 2020-05-02 ENCOUNTER — Other Ambulatory Visit (INDEPENDENT_AMBULATORY_CARE_PROVIDER_SITE_OTHER): Payer: Self-pay | Admitting: PHYSICIAN ASSISTANT

## 2020-05-02 NOTE — Telephone Encounter (Signed)
Last Visit:02/18/2020     Upcoming appointments: 08/18/2020           Erskine Speed, RN  05/02/2020, 14:21

## 2020-05-25 ENCOUNTER — Other Ambulatory Visit (INDEPENDENT_AMBULATORY_CARE_PROVIDER_SITE_OTHER): Payer: Self-pay | Admitting: Family Medicine

## 2020-05-25 DIAGNOSIS — F5104 Psychophysiologic insomnia: Secondary | ICD-10-CM

## 2020-05-25 DIAGNOSIS — F4310 Post-traumatic stress disorder, unspecified: Secondary | ICD-10-CM

## 2020-05-26 NOTE — Telephone Encounter (Signed)
Last Visit:02/18/2020     Upcoming appointments: 08/18/2020           Erskine Speed, RN  05/26/2020, 09:24

## 2020-06-03 ENCOUNTER — Other Ambulatory Visit (INDEPENDENT_AMBULATORY_CARE_PROVIDER_SITE_OTHER): Payer: Self-pay | Admitting: Family Medicine

## 2020-06-03 NOTE — Telephone Encounter (Signed)
Last Visit:02/18/2020     Upcoming appointments: 08/18/2020           Erskine Speed, RN  06/03/2020, 09:54

## 2020-06-04 ENCOUNTER — Other Ambulatory Visit (INDEPENDENT_AMBULATORY_CARE_PROVIDER_SITE_OTHER): Payer: Self-pay | Admitting: NURSE PRACTITIONER

## 2020-06-04 ENCOUNTER — Other Ambulatory Visit (INDEPENDENT_AMBULATORY_CARE_PROVIDER_SITE_OTHER): Payer: Self-pay | Admitting: Family Medicine

## 2020-06-04 DIAGNOSIS — F5104 Psychophysiologic insomnia: Secondary | ICD-10-CM

## 2020-06-04 DIAGNOSIS — F4310 Post-traumatic stress disorder, unspecified: Secondary | ICD-10-CM

## 2020-06-04 NOTE — Telephone Encounter (Signed)
Regarding: refill  ----- Message from Flonnie Hailstone II sent at 06/04/2020  1:16 PM EST -----  Wynonia Hazard, MD    Pt requesting refill of the below medication. Thank you.    zolpidem (AMBIEN) 10 mg Oral Tablet 21 Tablet 3 03/10/2020    Sig: take 1 tablet by mouth at bedtime if needed   Sent to pharmacy as: zolpidem 10 mg tablet Lorrin Mais)   Non-formulary Exception Code: DIYME/BR Formulary Info Available     Preferred Pharmacy     [id number] [place]    [place] [address]    Phone: [phone] Fax: [phone]    Hours: Not open 24 hours

## 2020-06-04 NOTE — Telephone Encounter (Signed)
Last Visit:02/18/2020     Upcoming appointments: 08/18/2020           Erskine Speed, RN  06/04/2020, 14:02

## 2020-06-04 NOTE — Telephone Encounter (Signed)
Last Visit:02/18/2020     Upcoming appointments: 08/18/2020        Erskine Speed, RN  06/04/2020, 10:17

## 2020-06-04 NOTE — Telephone Encounter (Signed)
Refill to soon, managed by Dr Carlisle Cater

## 2020-06-06 ENCOUNTER — Other Ambulatory Visit (INDEPENDENT_AMBULATORY_CARE_PROVIDER_SITE_OTHER): Payer: Self-pay | Admitting: Family Medicine

## 2020-06-06 DIAGNOSIS — F4310 Post-traumatic stress disorder, unspecified: Secondary | ICD-10-CM

## 2020-06-06 DIAGNOSIS — F5104 Psychophysiologic insomnia: Secondary | ICD-10-CM

## 2020-06-06 MED ORDER — ZOLPIDEM 10 MG TABLET
ORAL_TABLET | ORAL | 0 refills | Status: DC
Start: 2020-06-06 — End: 2020-06-25

## 2020-06-06 NOTE — Telephone Encounter (Signed)
Last Visit:02/18/2020     Upcoming appointments: 08/18/2020        Erskine Speed, RN  06/06/2020, 09:58

## 2020-06-06 NOTE — Telephone Encounter (Signed)
Last Visit:02/18/2020     Upcoming appointments: 08/18/2020           Erskine Speed, RN  06/06/2020, 15:56

## 2020-06-06 NOTE — Telephone Encounter (Signed)
Regarding: Dr Wynonia Hazard, refill medication  ----- Message from Marzetta Board sent at 06/06/2020  3:51 PM EST -----  Wynonia Hazard, MD    Patient called for refill- he is out of medication:    zolpidem (AMBIEN) 10 mg Oral Tablet 21 Tablet 3 03/10/2020    Sig: take 1 tablet by mouth at bedtime if needed   Sent to pharmacy as: zolpidem 10 mg tablet (AMBIEN)   Non-formulary Exception Code: SLPNP/YY Formulary Info Available   E-Prescribing Status: Receipt confirmed by pharmacy (03/10/2020 5:24 PM EDT)     Preferred Pharmacy     [id number], [place] - [place]    [place] [address]    Phone: [phone] Fax: [phone]    Hours: Not open 24 hours      Thank you,  Marzetta Board

## 2020-06-10 ENCOUNTER — Other Ambulatory Visit (INDEPENDENT_AMBULATORY_CARE_PROVIDER_SITE_OTHER): Payer: Self-pay | Admitting: NURSE PRACTITIONER

## 2020-06-10 DIAGNOSIS — M170 Bilateral primary osteoarthritis of knee: Secondary | ICD-10-CM

## 2020-06-10 DIAGNOSIS — M7918 Myalgia, other site: Secondary | ICD-10-CM

## 2020-06-10 DIAGNOSIS — G8929 Other chronic pain: Secondary | ICD-10-CM

## 2020-06-10 NOTE — Telephone Encounter (Signed)
Last Visit:02/18/2020     Upcoming appointments: 08/18/2020           Erskine Speed, RN  06/10/2020, 10:31

## 2020-06-15 ENCOUNTER — Other Ambulatory Visit (INDEPENDENT_AMBULATORY_CARE_PROVIDER_SITE_OTHER): Payer: Self-pay | Admitting: Family Medicine

## 2020-06-16 NOTE — Telephone Encounter (Signed)
Last Visit:02/18/2020     Upcoming appointments: 08/18/2020        Erskine Speed, RN  06/16/2020, 12:56

## 2020-06-23 ENCOUNTER — Other Ambulatory Visit (INDEPENDENT_AMBULATORY_CARE_PROVIDER_SITE_OTHER): Payer: Self-pay | Admitting: INTERNAL MEDICINE

## 2020-06-23 DIAGNOSIS — F4310 Post-traumatic stress disorder, unspecified: Secondary | ICD-10-CM

## 2020-06-23 DIAGNOSIS — F5104 Psychophysiologic insomnia: Secondary | ICD-10-CM

## 2020-06-23 NOTE — Telephone Encounter (Signed)
Last Visit:02/18/2020     Upcoming appointments: 08/18/2020           Erskine Speed, RN  06/23/2020, 12:28

## 2020-06-24 NOTE — Telephone Encounter (Signed)
I refilled this on 06/06/20.  Seems patient is requiring this more often.  If his PTSD is worsening, he needs an appointment to discuss how we can manage it better.

## 2020-06-24 NOTE — Telephone Encounter (Signed)
scheduled

## 2020-06-25 ENCOUNTER — Other Ambulatory Visit: Payer: Self-pay

## 2020-06-25 ENCOUNTER — Ambulatory Visit (INDEPENDENT_AMBULATORY_CARE_PROVIDER_SITE_OTHER): Payer: Medicare (Managed Care) | Admitting: INTERNAL MEDICINE

## 2020-06-25 ENCOUNTER — Encounter (INDEPENDENT_AMBULATORY_CARE_PROVIDER_SITE_OTHER): Payer: Self-pay | Admitting: INTERNAL MEDICINE

## 2020-06-25 VITALS — BP 120/80 | HR 71 | Temp 97.8°F | Ht 69.0 in | Wt 225.4 lb

## 2020-06-25 DIAGNOSIS — F4310 Post-traumatic stress disorder, unspecified: Secondary | ICD-10-CM

## 2020-06-25 DIAGNOSIS — F5104 Psychophysiologic insomnia: Secondary | ICD-10-CM

## 2020-06-25 DIAGNOSIS — R634 Abnormal weight loss: Secondary | ICD-10-CM

## 2020-06-25 DIAGNOSIS — F4312 Post-traumatic stress disorder, chronic: Secondary | ICD-10-CM

## 2020-06-25 DIAGNOSIS — K219 Gastro-esophageal reflux disease without esophagitis: Secondary | ICD-10-CM

## 2020-06-25 DIAGNOSIS — R630 Anorexia: Secondary | ICD-10-CM

## 2020-06-25 MED ORDER — PAROXETINE 20 MG TABLET
20.0000 mg | ORAL_TABLET | Freq: Every day | ORAL | 1 refills | Status: DC
Start: 2020-06-25 — End: 2020-12-05

## 2020-06-25 MED ORDER — ZOLPIDEM 10 MG TABLET
ORAL_TABLET | ORAL | 0 refills | Status: DC
Start: 2020-06-25 — End: 2020-07-14

## 2020-06-25 MED ORDER — ASPIRIN 81 MG TABLET,DELAYED RELEASE
81.0000 mg | DELAYED_RELEASE_TABLET | Freq: Every day | ORAL | 3 refills | Status: DC
Start: 2020-06-25 — End: 2021-08-25

## 2020-06-25 NOTE — Nursing Note (Signed)
Pt is here for a f/u. Pt states he has no complaints at this time.

## 2020-06-25 NOTE — Progress Notes (Signed)
[facility], [place]  [place] [address]  Phone: [phone]  Fax: [phone]    Encounter Date: 06/25/2020    Patient ID:  Steve Young  YQI:[id number]    DOB: 12-21-1960  Age: 60 y.o. male    Subjective:     Chief Complaint   Patient presents with   . Follow Up       HPI   Steve Young is a 60 y.o. male who presents for follow up of PTSD and chronic insomnia.  He was diagnosed with PTSD in 1987 after almost losing his arm in an incident at work.  He has been on Ambien since 2019 and Paxil since 2020.  He takes Ambien every night.  He has only been on 10 mg of Paxil.  He states he continues to have some flashbacks every week or so.  He denies feeling nervous, depressed, and hopeless.  Denies suicidal ideations, substance abuse.  He does state he has trouble sleeping every night.  Has trouble both falling asleep and maintaining sleep.  States with the Ambien he is able to sleep about 3 hours each night.  He states he can't go to sleep because he does not feel tired.  He states he sometimes wakes up and cleans/washes dishes in the middle of the night.  Denies drinking caffeine before bed.  States he has had negative sleep studies in the past.    Patient also notes decreased appetite and unintentional weight loss.  States he forces himself to eat every few days.  States she has chronic reflux with breakthrough heartburn at times.  Takes Prilosec daily.       Current Outpatient Medications   Medication Sig   . albuterol sulfate (PROVENTIL OR VENTOLIN OR PROAIR) 90 mcg/actuation Inhalation HFA Aerosol Inhaler inhale 1 puff by mouth every 4 hours if needed wheezing   . amLODIPine (NORVASC) 10 mg Oral Tablet take 1 tablet by mouth once daily   . aspirin (ECOTRIN) 81 mg Oral Tablet, Delayed Release (E.C.) Take 1 Tablet (81 mg total) by mouth Once a day   . atorvastatin (LIPITOR) 80 mg Oral Tablet take 1 tablet by mouth at bedtime   . chlorthalidone  (HYGROTON) 25 mg Oral Tablet take 1 tablet by mouth once daily   . cholecalciferol, vitamin D3, 125 mcg (5,000 unit) Oral Capsule take 1 capsule by mouth once daily   . diclofenac sodium (VOLTAREN) 1 % Gel by Apply Topically route Four times a day - before meals and bedtime   . folic acid (FOLVITE) 1 mg Oral Tablet take 1 tablet by mouth once daily   . gabapentin (NEURONTIN) 600 mg Oral Tablet take 1 tablet by mouth three times a day   . Ibuprofen (MOTRIN) 800 mg Oral Tablet take 1 tablet by mouth three times a day if needed for pain take with food   . losartan (COZAAR) 100 mg Oral Tablet take 1 tablet by mouth once daily   . lubiprostone (AMITIZA) 24 mcg Oral Capsule Take 1 Cap (24 mcg total) by mouth Twice daily   . magnesium oxide (MAG-OX) 400 mg (241.3 mg magnesium) Oral Tablet Take 400 mg by mouth   . methocarbamol (ROBAXIN) 500 mg Oral Tablet    . mupirocin (BACTROBAN) 2 % Ointment by Apply Topically route Three times a day   . naproxen (NAPROSYN) 500 mg Oral Tablet Take 1 Tablet (500 mg total) by mouth Twice daily with food   .  NARCAN 4 mg/actuation Nasal Spray, Non-Aerosol INSTILL 1 SPRAY NASALLY AS NEEDED PER CDC RECOMMENDATIONS   . omega-3 fatty acid (LOVAZA) 1 gram Oral Capsule take 2 capsules by mouth twice a day   . omeprazole (PRILOSEC) 40 mg Oral Capsule, Delayed Release(E.C.) take 1 capsule by mouth once daily   . oxyCODONE-acetaminophen (PERCOCET) 10-325 mg Oral Tablet take 1 tablet by mouth three times a day if needed for pain   . PARoxetine (PAXIL) 20 mg Oral Tablet Take 1 Tablet (20 mg total) by mouth Once a day   . Sildenafil (VIAGRA) 25 mg Oral Tablet take 1 tablet by mouth every 24 hours if needed   . SYMBICORT 160-4.5 mcg/actuation Inhalation HFA Aerosol Inhaler inhale 2 puffs by mouth twice a day Rinse mouth after use   . tiotropium bromide (SPIRIVA RESPIMAT) 1.25 mcg/actuation Inhalation Mist Take 2.5 mcg by inhalation Once a day Indications: controller medication for asthma   . zafirlukast  (ACCOLATE) 20 mg Oral Tablet take 1 tablet by mouth twice a day before meals   . zolpidem (AMBIEN) 10 mg Oral Tablet take 1 tablet by mouth at bedtime if needed Indications: difficulty falling asleep       No Known Allergies       Past Medical History:   Diagnosis Date   . Arthralgia of hip 03/21/2018   . Arthralgia of right upper arm 03/30/2016   . Asthma    . Atherosclerosis of both carotid arteries 03/04/2020   . Cervicalgia 03/21/2018   . Chronic abdominal pain 10/04/2018   . Chronic back pain 03/21/2018   . Chronic chest pain 03/21/2018   . Chronic insomnia    . Constipation in male 09/13/2017   . Esophageal reflux    . 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 Hepatic lesion 10/04/2018   . Hyperlipidemia    . Hypertension    . Impingement syndrome of left shoulder 12/28/2017   . Mild persistent asthma without complication 93/81/0175   . NAFL (nonalcoholic fatty liver)    . Normal cardiac stress test 03/21/2018   . Nosebleed    . Obesity    . 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 Partial tear of common extensor tendon of elbow 12/13/2017   . Pituitary tumor    . Pre-diabetes    . Rib pain 10/04/2018   . Sexual dysfunction    . Tear of talofibular ligament 05/30/2013    RIGHT   . Vitamin D deficiency        Past Surgical History:   Procedure Laterality Date   . COLONOSCOPY     . ELBOW SURGERY Left 12/2017   . HX OTHER  2004     SPINAL FUSION,ANT,EA ADNL LEVEL c3-c7   . HX OTHER Right     right great toe   . HX SHOULDER SURGERY Bilateral    . NOSE SURGERY         Family Medical History:     Problem Relation (Age of Onset)    Cancer Mother, Sister          Social History     Tobacco Use   . Smoking status: Never Smoker   . Smokeless tobacco: Never Used   Vaping Use   . Vaping Use: Never used   Substance Use Topics   . Alcohol use: Not Currently   . Drug use: Never       Review of Systems   Constitutional: Positive for appetite change. Negative for chills  and fever.   HENT: Negative for congestion and rhinorrhea.     Respiratory: Negative for cough and shortness of breath.    Gastrointestinal: Negative for abdominal pain and diarrhea.   Psychiatric/Behavioral: Positive for sleep disturbance. Negative for dysphoric mood and suicidal ideas. The patient is hyperactive. The patient is not nervous/anxious.        Objective:   Vitals: BP 120/80 (Site: Left, Patient Position: Sitting, Cuff Size: Adult Large)   Pulse 71   Temp 36.6 C (97.8 F) (Thermal Scan)   Ht 1.753 m (5\' 9" )   Wt 102 kg (225 lb 6 oz)   SpO2 98%   BMI 33.28 kg/m       Physical Exam  Constitutional:       General: He is not in acute distress.     Appearance: He is not diaphoretic.   HENT:      Head: Normocephalic and atraumatic.   Eyes:      Extraocular Movements: Extraocular movements intact.      Conjunctiva/sclera: Conjunctivae normal.   Cardiovascular:      Rate and Rhythm: Normal rate and regular rhythm.      Heart sounds: No murmur heard.    No friction rub. No gallop.   Pulmonary:      Effort: No respiratory distress.      Breath sounds: No wheezing, rhonchi or rales.   Abdominal:      General: Bowel sounds are normal.      Palpations: Abdomen is soft. There is no mass.      Tenderness: There is no abdominal tenderness. There is no guarding or rebound.   Musculoskeletal:      Right lower leg: No edema.      Left lower leg: No edema.   Skin:     General: Skin is warm and dry.   Neurological:      General: No focal deficit present.      Mental Status: He is alert and oriented to person, place, and time.      Gait: Gait normal.   Psychiatric:         Mood and Affect: Mood normal.         Behavior: Behavior normal.         Judgment: Judgment normal.       Assessment & Plan:     ENCOUNTER DIAGNOSES     ICD-10-CM   1. Chronic post-traumatic stress disorder (PTSD)  F43.12   2. Chronic insomnia  F51.04   3. Gastroesophageal reflux disease, unspecified whether esophagitis present  K21.9   4. Decreased appetite  R63.0   5. Unintended weight loss  R63.4   6.  PTSD (post-traumatic stress disorder)  F43.10       Orders Placed This Encounter   . OUTSIDE CONSULT/REFERRAL PROVIDER(AMB)   . PARoxetine (PAXIL) 20 mg Oral Tablet   . aspirin (ECOTRIN) 81 mg Oral Tablet, Delayed Release (E.C.)   . zolpidem (AMBIEN) 10 mg Oral Tablet       Chronic PTSD  Chronic insomnia  - Patient continues to have some flashbacks as well as uncontrolled insomnia  - Will increase Paxil to 20 mg daily  - Refilled his Ambien; hope we can reduce the need for this as Paxil is increased    GERD  Unintentional weight loss  Decreased appetite   - Refer to GI due to uncontrolled GERD with the above associated symptoms  - Additionally he has failed discontinuation trial of PPI; states  he cannot go without his Prilosec  - Continue Prilosec at this time and refer      Return for routine follow up as scheduled.    Amalia Hailey, DO

## 2020-07-12 ENCOUNTER — Other Ambulatory Visit (INDEPENDENT_AMBULATORY_CARE_PROVIDER_SITE_OTHER): Payer: Self-pay | Admitting: INTERNAL MEDICINE

## 2020-07-12 DIAGNOSIS — F5104 Psychophysiologic insomnia: Secondary | ICD-10-CM

## 2020-07-12 DIAGNOSIS — F4310 Post-traumatic stress disorder, unspecified: Secondary | ICD-10-CM

## 2020-07-13 ENCOUNTER — Other Ambulatory Visit (INDEPENDENT_AMBULATORY_CARE_PROVIDER_SITE_OTHER): Payer: Self-pay | Admitting: NURSE PRACTITIONER

## 2020-07-14 NOTE — Telephone Encounter (Signed)
Last Visit:06/25/2020     Upcoming appointments: 08/18/2020    Erskine Speed, RN  07/14/2020, 09:23

## 2020-07-14 NOTE — Telephone Encounter (Signed)
Last Visit:06/25/2020     Upcoming appointments: 08/18/2020           Erskine Speed, RN  07/14/2020, 10:21

## 2020-07-31 ENCOUNTER — Other Ambulatory Visit (INDEPENDENT_AMBULATORY_CARE_PROVIDER_SITE_OTHER): Payer: Self-pay | Admitting: INTERNAL MEDICINE

## 2020-07-31 DIAGNOSIS — F4310 Post-traumatic stress disorder, unspecified: Secondary | ICD-10-CM

## 2020-07-31 DIAGNOSIS — F5104 Psychophysiologic insomnia: Secondary | ICD-10-CM

## 2020-07-31 NOTE — Telephone Encounter (Signed)
Last Visit:06/25/2020     Upcoming appointments: 08/18/2020        Erskine Speed, RN  07/31/2020, 09:45

## 2020-08-01 ENCOUNTER — Other Ambulatory Visit (INDEPENDENT_AMBULATORY_CARE_PROVIDER_SITE_OTHER): Payer: Self-pay | Admitting: NURSE PRACTITIONER

## 2020-08-01 DIAGNOSIS — F4310 Post-traumatic stress disorder, unspecified: Secondary | ICD-10-CM

## 2020-08-01 DIAGNOSIS — M7918 Myalgia, other site: Secondary | ICD-10-CM

## 2020-08-01 DIAGNOSIS — M170 Bilateral primary osteoarthritis of knee: Secondary | ICD-10-CM

## 2020-08-01 DIAGNOSIS — F5104 Psychophysiologic insomnia: Secondary | ICD-10-CM

## 2020-08-01 MED ORDER — ZOLPIDEM 10 MG TABLET
ORAL_TABLET | ORAL | 0 refills | Status: DC
Start: 2020-08-01 — End: 2020-08-22

## 2020-08-01 MED ORDER — IBUPROFEN 800 MG TABLET
ORAL_TABLET | ORAL | 0 refills | Status: DC
Start: 2020-08-01 — End: 2020-08-19

## 2020-08-01 NOTE — Telephone Encounter (Signed)
Patient needing refill on Ambien

## 2020-08-01 NOTE — Addendum Note (Signed)
Addended by: Erskine Speed on: 08/01/2020 04:12 PM     Modules accepted: Orders

## 2020-08-01 NOTE — Telephone Encounter (Signed)
Removed naproxen from his list.  Refilled ibuprofen.  I have refilled 15 tabs of Ambien, which will last him until Dr. Carlisle Cater returns.

## 2020-08-01 NOTE — Telephone Encounter (Signed)
Amalia Hailey, DO  [facility] [place] [facility] 49 minutes ago (2:17 PM)     EM    Patient is prescribed both ibuprofen and naproxen. I tried to call to discuss but patient did not answer. Please confirm which of these he is taking.      Left message on machine for patient to return call

## 2020-08-01 NOTE — Telephone Encounter (Signed)
Last Visit:06/25/2020     Upcoming appointments: 08/18/2020           Erskine Speed, RN  08/01/2020, 09:53

## 2020-08-01 NOTE — Addendum Note (Signed)
Addended by: Amalia Hailey on: 08/01/2020 04:16 PM     Modules accepted: Orders

## 2020-08-01 NOTE — Telephone Encounter (Signed)
Steve Hailey, DO  [facility] [place] [facility] 46 minutes ago (2:19 PM)     EM    This is a PRN controlled substance. I will refill if patient requests, but will not automatically refill upon pharmacy request.      Left message on machine for patient to return call

## 2020-08-01 NOTE — Telephone Encounter (Signed)
Patient states he is not taking naproxen, just ibuprofen

## 2020-08-08 ENCOUNTER — Other Ambulatory Visit (INDEPENDENT_AMBULATORY_CARE_PROVIDER_SITE_OTHER): Payer: Self-pay | Admitting: Family

## 2020-08-08 NOTE — Telephone Encounter (Signed)
Last Visit:06/25/2020     Upcoming appointments: 08/18/2020      Erskine Speed, RN  08/08/2020, 09:46

## 2020-08-14 ENCOUNTER — Other Ambulatory Visit (INDEPENDENT_AMBULATORY_CARE_PROVIDER_SITE_OTHER): Payer: Self-pay | Admitting: INTERNAL MEDICINE

## 2020-08-14 DIAGNOSIS — F4310 Post-traumatic stress disorder, unspecified: Secondary | ICD-10-CM

## 2020-08-14 DIAGNOSIS — F5104 Psychophysiologic insomnia: Secondary | ICD-10-CM

## 2020-08-18 ENCOUNTER — Emergency Department (HOSPITAL_COMMUNITY): Payer: Auto Insurance (includes no fault)

## 2020-08-18 ENCOUNTER — Ambulatory Visit (INDEPENDENT_AMBULATORY_CARE_PROVIDER_SITE_OTHER): Payer: Medicare (Managed Care) | Admitting: Family Medicine

## 2020-08-18 ENCOUNTER — Encounter (INDEPENDENT_AMBULATORY_CARE_PROVIDER_SITE_OTHER): Payer: Self-pay | Admitting: Family Medicine

## 2020-08-18 ENCOUNTER — Other Ambulatory Visit: Payer: Self-pay

## 2020-08-18 ENCOUNTER — Other Ambulatory Visit (INDEPENDENT_AMBULATORY_CARE_PROVIDER_SITE_OTHER): Payer: Self-pay | Admitting: Family Medicine

## 2020-08-18 ENCOUNTER — Encounter (HOSPITAL_COMMUNITY): Payer: Self-pay

## 2020-08-18 ENCOUNTER — Other Ambulatory Visit (INDEPENDENT_AMBULATORY_CARE_PROVIDER_SITE_OTHER): Payer: Self-pay | Admitting: INTERNAL MEDICINE

## 2020-08-18 ENCOUNTER — Emergency Department
Admission: EM | Admit: 2020-08-18 | Discharge: 2020-08-19 | Disposition: A | Discharge status: Home / Self Care | Payer: Auto Insurance (includes no fault) | Attending: Emergency Medicine | Admitting: Emergency Medicine

## 2020-08-18 VITALS — BP 120/82 | HR 74 | Temp 96.9°F | Resp 18 | Ht 69.0 in | Wt 226.0 lb

## 2020-08-18 DIAGNOSIS — E782 Mixed hyperlipidemia: Secondary | ICD-10-CM

## 2020-08-18 DIAGNOSIS — I10 Essential (primary) hypertension: Secondary | ICD-10-CM

## 2020-08-18 DIAGNOSIS — Y92410 Unspecified street and highway as the place of occurrence of the external cause: Secondary | ICD-10-CM

## 2020-08-18 DIAGNOSIS — R9431 Abnormal electrocardiogram [ECG] [EKG]: Secondary | ICD-10-CM

## 2020-08-18 DIAGNOSIS — M7918 Myalgia, other site: Secondary | ICD-10-CM

## 2020-08-18 DIAGNOSIS — G8929 Other chronic pain: Secondary | ICD-10-CM

## 2020-08-18 DIAGNOSIS — M549 Dorsalgia, unspecified: Secondary | ICD-10-CM | POA: Insufficient documentation

## 2020-08-18 DIAGNOSIS — J4540 Moderate persistent asthma, uncomplicated: Secondary | ICD-10-CM

## 2020-08-18 DIAGNOSIS — Z125 Encounter for screening for malignant neoplasm of prostate: Secondary | ICD-10-CM

## 2020-08-18 DIAGNOSIS — Z1211 Encounter for screening for malignant neoplasm of colon: Secondary | ICD-10-CM

## 2020-08-18 DIAGNOSIS — F4310 Post-traumatic stress disorder, unspecified: Secondary | ICD-10-CM

## 2020-08-18 DIAGNOSIS — M542 Cervicalgia: Secondary | ICD-10-CM | POA: Insufficient documentation

## 2020-08-18 DIAGNOSIS — I440 Atrioventricular block, first degree: Secondary | ICD-10-CM

## 2020-08-18 DIAGNOSIS — E876 Hypokalemia: Secondary | ICD-10-CM | POA: Insufficient documentation

## 2020-08-18 DIAGNOSIS — R7303 Prediabetes: Secondary | ICD-10-CM

## 2020-08-18 DIAGNOSIS — M170 Bilateral primary osteoarthritis of knee: Secondary | ICD-10-CM

## 2020-08-18 DIAGNOSIS — M25511 Pain in right shoulder: Secondary | ICD-10-CM | POA: Insufficient documentation

## 2020-08-18 DIAGNOSIS — K760 Fatty (change of) liver, not elsewhere classified: Secondary | ICD-10-CM | POA: Insufficient documentation

## 2020-08-18 HISTORY — DX: Type 2 diabetes mellitus without complications (CMS HCC): E11.9

## 2020-08-18 LAB — CBC WITH DIFF
BASOPHIL #: <0.1 10*3/uL (ref <=0.20)
BASOPHIL %: 1 %
EOSINOPHIL #: 0.22 10*3/uL (ref <=0.50)
EOSINOPHIL %: 2 %
HCT: 40.6 % (ref 38.9–52.0)
HGB: 13.9 g/dL (ref 13.4–17.5)
IMMATURE GRANULOCYTE #: <0.1 10*3/uL (ref <0.10)
IMMATURE GRANULOCYTE %: 0 % (ref 0–1)
LYMPHOCYTE #: 2.89 10*3/uL (ref 1.00–4.80)
LYMPHOCYTE %: 21 %
MCH: 29.3 pg (ref 26.0–32.0)
MCHC: 34.2 g/dL (ref 31.0–35.5)
MCV: 85.7 fL (ref 78.0–100.0)
MONOCYTE #: 0.84 10*3/uL (ref 0.20–1.10)
MONOCYTE %: 6 %
MPV: 11.1 fL (ref 8.7–12.5)
NEUTROPHIL #: 9.79 10*3/uL — ABNORMAL HIGH (ref 1.50–7.70)
NEUTROPHIL %: 70 %
PLATELETS: 257 10*3/uL (ref 150–400)
RBC: 4.74 10*6/uL (ref 4.50–6.10)
RDW-CV: 13.1 % (ref 11.5–15.5)
WBC: 13.9 10*3/uL — ABNORMAL HIGH (ref 3.7–11.0)

## 2020-08-18 LAB — COMPREHENSIVE METABOLIC PANEL, NON-FASTING
ALBUMIN/GLOBULIN RATIO: 1.7 (ref >=1.0)
ALBUMIN: 4.3 g/dL (ref 3.5–5.2)
ALKALINE PHOSPHATASE: 64 U/L (ref 41–133)
ALT (SGPT): 13 U/L (ref 0–55)
ANION GAP: 10 mmol/L (ref 6–15)
AST (SGOT): 14 U/L (ref 5–34)
BILIRUBIN TOTAL: 1.4 mg/dL — ABNORMAL HIGH (ref 0.2–1.2)
BUN: 10 mg/dL (ref 7–21)
CALCIUM: 9.2 mg/dL (ref 8.0–10.6)
CHLORIDE: 99 mmol/L (ref 98–107)
CO2 TOTAL: 29 mmol/L (ref 21–32)
CREATININE: 1.05 mg/dL (ref 0.80–1.60)
ESTIMATED GFR: >60 mL/min/{1.73_m2}
GLUCOSE: 92 mg/dL (ref 70–100)
POTASSIUM: 2.9 mmol/L — CL (ref 3.3–5.1)
PROTEIN TOTAL: 6.9 g/dL (ref 6.4–8.3)
SODIUM: 138 mmol/L (ref 136–146)

## 2020-08-18 LAB — LIPASE: LIPASE: 7 U/L — ABNORMAL LOW (ref 8–78)

## 2020-08-18 LAB — PTT (PARTIAL THROMBOPLASTIN TIME): APTT: 24.2 seconds (ref 23.0–32.0)

## 2020-08-18 LAB — CREATINE KINASE (CK), TOTAL, SERUM: CREATINE KINASE: 98 U/L (ref 30–200)

## 2020-08-18 LAB — LACTIC ACID LEVEL W/ REFLEX FOR LEVEL >2.0: LACTIC ACID: 3.2 mmol/L — ABNORMAL HIGH (ref 0.5–2.0)

## 2020-08-18 LAB — PT/INR
INR: 1.02 (ref 0.90–1.10)
PROTHROMBIN TIME: 10.4 seconds (ref 9.0–13.0)

## 2020-08-18 LAB — TROPONIN I, HIGH SENSITIVITY: TROPONIN I, HIGH SENSITIVITY: 4.2 pg/mL (ref 2.00–20.00)

## 2020-08-18 MED ORDER — MORPHINE 4 MG/ML INJECTION SYRINGE
4.0000 mg | INJECTION | INTRAMUSCULAR | Status: AC
Start: 2020-08-18 — End: 2020-08-18
  Administered 2020-08-18 (×2): 4 mg via INTRAVENOUS
  Filled 2020-08-18: qty 1

## 2020-08-18 MED ORDER — IOHEXOL 300 MG IODINE/ML INTRAVENOUS SOLUTION
95.0000 mL | INTRAVENOUS | Status: AC
Start: 2020-08-18 — End: 2020-08-18
  Administered 2020-08-18: 23:00:00 95 mL via INTRAVENOUS

## 2020-08-18 MED ORDER — SODIUM CHLORIDE 0.9 % IV BOLUS
1000.0000 mL | INJECTION | Status: AC
Start: 2020-08-18 — End: 2020-08-18
  Administered 2020-08-18: 1000 mL via INTRAVENOUS
  Administered 2020-08-18: 0 mL via INTRAVENOUS

## 2020-08-18 NOTE — Nursing Note (Signed)
Pt is here for a follow up

## 2020-08-18 NOTE — ED Nurses Note (Signed)
Security at bedside to lock up valuables
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
## 2020-08-18 NOTE — ED Nurses Note (Signed)
Pt encouraged to provide cc urine sample

## 2020-08-18 NOTE — Progress Notes (Signed)
Chief Complaint   Patient presents with   . Follow Up      Hypertension, hyperlipidemia, asthma, PTSD, prediabetes, musculoskeletal pain     Last office visit with the provider: 02/18/2020    SUBJECTIVE:  Steve Young is a 60 y.o. male,White. Patient is feeling well today. He has no weight loss. He has chronic musculoskeletal pain and states he has more pain in his neck. He does not recall any recent injuries. He states he had physical therapy before for his neck pain but the therapy made the pain worse. He does not have the following symptoms today: fever, chills, cough, nasal congestion, sore throat, abdominal pain, headache, dizziness, eye pain or irritation, epistaxis, chest discomfort, stroke symptoms, shortness of breath, dyspnea on exertion or palpitations. Side effects of medications: no current medication side effects.     Current Outpatient Medications   Medication Sig   . albuterol sulfate (PROVENTIL OR VENTOLIN OR PROAIR) 90 mcg/actuation Inhalation HFA Aerosol Inhaler inhale 1 puff by mouth every 4 hours if needed wheezing   . amLODIPine (NORVASC) 10 mg Oral Tablet take 1 tablet by mouth once daily   . aspirin (ECOTRIN) 81 mg Oral Tablet, Delayed Release (E.C.) Take 1 Tablet (81 mg total) by mouth Once a day   . atorvastatin (LIPITOR) 80 mg Oral Tablet take 1 tablet by mouth at bedtime   . chlorthalidone (HYGROTON) 25 mg Oral Tablet take 1 tablet by mouth once daily   . cholecalciferol, vitamin D3, 125 mcg (5,000 unit) Oral Capsule take 1 capsule by mouth once daily   . diclofenac sodium (VOLTAREN) 1 % Gel by Apply Topically route Four times a day - before meals and bedtime   . folic acid (FOLVITE) 1 mg Oral Tablet take 1 tablet by mouth once daily   . gabapentin (NEURONTIN) 600 mg Oral Tablet take 1 tablet by mouth three times a day   . Ibuprofen (MOTRIN) 800 mg Oral Tablet take 1 tablet by mouth three times a day if needed for pain take with food   . losartan (COZAAR) 100 mg Oral Tablet take 1  tablet by mouth once daily   . lubiprostone (AMITIZA) 24 mcg Oral Capsule Take 1 Cap (24 mcg total) by mouth Twice daily   . magnesium oxide (MAG-OX) 400 mg (241.3 mg magnesium) Oral Tablet Take 400 mg by mouth   . methocarbamol (ROBAXIN) 500 mg Oral Tablet    . mupirocin (BACTROBAN) 2 % Ointment by Apply Topically route Three times a day   . omega-3 fatty acid (LOVAZA) 1 gram Oral Capsule take 2 capsules by mouth twice a day   . omeprazole (PRILOSEC) 40 mg Oral Capsule, Delayed Release(E.C.) take 1 capsule by mouth once daily   . oxyCODONE-acetaminophen (PERCOCET) 10-325 mg Oral Tablet take 1 tablet by mouth three times a day if needed for pain   . PARoxetine (PAXIL) 20 mg Oral Tablet Take 1 Tablet (20 mg total) by mouth Once a day   . Sildenafil (VIAGRA) 25 mg Oral Tablet take 1 tablet by mouth every 24 hours if needed   . SYMBICORT 160-4.5 mcg/actuation Inhalation HFA Aerosol Inhaler inhale 2 puffs by mouth twice a day Rinse mouth after use   . tiotropium bromide (SPIRIVA RESPIMAT) 1.25 mcg/actuation Inhalation Mist Take 2.5 mcg by inhalation Once a day Indications: controller medication for asthma   . zafirlukast (ACCOLATE) 20 mg Oral Tablet take 1 tablet by mouth twice a day before meals   . zolpidem (AMBIEN)  10 mg Oral Tablet take 1 tablet by mouth at bedtime if needed      No Known Allergies    Past Surgical History:   Procedure Laterality Date   . COLONOSCOPY     . ELBOW SURGERY Left 12/2017   . HX OTHER  2004     SPINAL FUSION,ANT,EA ADNL LEVEL c3-c7   . HX OTHER Right     right great toe   . HX SHOULDER SURGERY Bilateral    . NOSE SURGERY       Social History     Tobacco Use   . Smoking status: Never Smoker   . Smokeless tobacco: Never Used   Vaping Use   . Vaping Use: Never used   Substance Use Topics   . Alcohol use: Not Currently   . Drug use: Never     OBJECTIVE:   The patient appears to be in no acute distress.  Vitals: BP 120/82   Pulse 74   Temp 36.1 C (96.9 F)   Resp 18   Ht 1.753 m (5\' 9" )    Wt 103 kg (226 lb)   SpO2 97%   BMI 33.37 kg/m     General: alert, no acute distress  Head: normocephalic, atraumatic  Eye: EOM intact, normal conjunctiva  Neck: Left posterior neck pain without swelling  Respiratory: Good diaphragmatic excursion. Lungs clear.  Heart: RRR  Extremities: Ambulatory with normal gait, no edema  Neurologic: Awake, alert, oriented, speech clear and coherent  Skin: No jaundice, warm   Psychiatric: Cooperative, normal affect     ASSESSMENT AND PLAN:    2 ICD-10-CM    1. Essential hypertension  I10 LIPID PANEL     COMPREHENSIVE METABOLIC PNL, FASTING  Stable on current medications.   2. Mixed hyperlipidemia  E78.2 LIPID PANEL     COMPREHENSIVE METABOLIC PNL, FASTING  Needs improvement. Continue current medications and lifestyle changes.   3. Moderate persistent asthma without complication  U27.25 COMPREHENSIVE METABOLIC PNL, FASTING  Stable current medications.   4. PTSD (post-traumatic stress disorder)  F43.10 COMPREHENSIVE METABOLIC PNL, FASTING  Stable on current medications   5. Colon cancer screening  Z12.11 [facility] ENDOSCOPY REQUEST (AMB) -  colonoscopy  His last colonoscopy was in 2011.   6. Chronic musculoskeletal pain  M79.18 Cervical Spine Series Xray    G89.29 COMPREHENSIVE METABOLIC PNL, FASTING  Continue pain management.   7. Prediabetes  R73.03 HGA1C (HEMOGLOBIN A1C WITH EST AVG GLUCOSE)     COMPREHENSIVE METABOLIC PNL, FASTING  Stable. Continue ADA diet, regular exercise as tolerated and weight loss recommended.   8. Screening PSA (prostate specific antigen)  Z12.5 PSA TOTAL AND FREE  He would still like to get prostate cancer screening. He has no urinary discomfort.     Data reviewed:  Vitamin-D level on 02/04/2020 was normal at 42. PSA screening on 03/05/2019 was normal at 1.5  Lab Results   Component Value Date/Time    HA1C 5.8 (H) 02/18/2020 01:26 PM    BUN 13 02/04/2020 01:05 PM    CREATININE 1.13 02/04/2020 01:05 PM    GFR >60 02/04/2020 01:05 PM    GLUCOSEFAST 114  03/05/2019 12:00 AM    SODIUM 140 02/04/2020 01:05 PM    POTASSIUM 3.9 02/18/2020 01:26 PM    WBC 8.6 02/04/2020 01:05 PM    HGB 15.3 02/04/2020 01:05 PM    HCT 44.0 02/04/2020 01:05 PM    PLTCNT 251 02/04/2020 01:05 PM    AST 16  02/04/2020 01:05 PM    ALT 18 02/04/2020 01:05 PM    CHOLESTEROL 210 (H) 02/18/2020 01:26 PM    HDLCHOL 46 02/18/2020 01:26 PM    LDLCHOL 140 (H) 02/18/2020 01:26 PM    TRIG 121 02/18/2020 01:26 PM    TSH 0.562 06/11/2019 02:06 PM    MAGNESIUM 2.2 02/12/2020 12:50 PM     Orders Placed This Encounter   . Cervical Spine Series Xray   . LIPID PANEL   . HGA1C (HEMOGLOBIN A1C WITH EST AVG GLUCOSE)   . COMPREHENSIVE METABOLIC PNL, FASTING   . PSA TOTAL AND FREE   . [facility] ENDOSCOPY REQUEST (AMB) -  colonoscopy     Recheck blood work again prior to next office visit. Above plan was discussed with the patient and patient verbalized understanding. Patient agreed with above treatment and plan. Questions answered to patient's satisfaction. Continue maintenance medications.  Follow-up in 3 months , sooner should new symptoms or problems arise.   He  was advised to contact the office if with any questions or concerns.    Jarome Lamas. Shona Simpson, MD  Note: This chart was transcribed using voice recognition software and may contain unintended word substitution or minor typographical errors.
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
## 2020-08-19 ENCOUNTER — Telehealth (INDEPENDENT_AMBULATORY_CARE_PROVIDER_SITE_OTHER): Payer: Self-pay | Admitting: Family Medicine

## 2020-08-19 LAB — URINALYSIS, MACRO/MICRO
BILIRUBIN: NEGATIVE mg/dL
BLOOD: NEGATIVE mg/dL
GLUCOSE: NEGATIVE mg/dL
KETONES: NEGATIVE mg/dL
LEUKOCYTES: NEGATIVE WBCs/uL
NITRITE: NEGATIVE
PH: 7.5 (ref 5.0–9.0)
PROTEIN: NEGATIVE mg/dL
SPECIFIC GRAVITY: 1.074 — ABNORMAL HIGH (ref 1.001–1.030)
UROBILINOGEN: 1 mg/dL (ref 0.2–1.0)

## 2020-08-19 LAB — LACTIC ACID TIMED: LACTIC ACID: 0.9 mmol/L (ref 0.5–2.0)

## 2020-08-19 LAB — ECG 12 LEAD
Atrial Rate: 68 {beats}/min
Calculated P Axis: 30 degrees
PR Interval: 232 ms
QRS Duration: 86 ms
QT Interval: 398 ms
QTC Calculation: 423 ms
Ventricular rate: 68 {beats}/min

## 2020-08-19 LAB — RED TOP TUBE

## 2020-08-19 MED ORDER — KETOROLAC 30 MG/ML (1 ML) INJECTION SOLUTION
30.0000 mg | INTRAMUSCULAR | Status: AC
Start: 2020-08-19 — End: 2020-08-19
  Administered 2020-08-19: 01:00:00 30 mg via INTRAVENOUS
  Filled 2020-08-19: qty 1

## 2020-08-19 MED ORDER — POTASSIUM BICARBONATE-CITRIC ACID 20 MEQ EFFERVESCENT TABLET
40.0000 meq | EFFERVESCENT_TABLET | ORAL | Status: AC
Start: 2020-08-19 — End: 2020-08-19
  Administered 2020-08-19 (×2): 40 meq via ORAL
  Filled 2020-08-19: qty 2
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
## 2020-08-19 NOTE — Telephone Encounter (Signed)
Pt is willing to do in the hospital

## 2020-08-19 NOTE — Telephone Encounter (Signed)
Last Visit:08/18/20     Upcoming appointments: [id number]           Erskine Speed, RN  08/19/2020, 14:19

## 2020-08-19 NOTE — Telephone Encounter (Signed)
Please check if willing to do in the hospital. If willing, may proceed with internal order. Thank you.

## 2020-08-19 NOTE — Discharge Instructions (Addendum)
I recommend ibuprofen every 4-6 hours as needed for minor aches and pains.  Please take this medication with a snack as it can upset your stomach.

## 2020-08-19 NOTE — Telephone Encounter (Signed)
Good afternoon, patient was ordered scope the order went internally which means he will be scheduled at the hospital did you want him internally or externally to [facility], if external we need new order.Marland Kitchen

## 2020-08-19 NOTE — Telephone Encounter (Signed)
Last Visit:08/18/20     Upcoming appointments:11/18/2020           Erskine Speed, RN  08/19/2020, 14:19

## 2020-08-20 ENCOUNTER — Other Ambulatory Visit (INDEPENDENT_AMBULATORY_CARE_PROVIDER_SITE_OTHER): Payer: Self-pay | Admitting: Physician Assistant

## 2020-08-20 ENCOUNTER — Other Ambulatory Visit (INDEPENDENT_AMBULATORY_CARE_PROVIDER_SITE_OTHER): Payer: Self-pay | Admitting: NURSE PRACTITIONER

## 2020-08-20 NOTE — Telephone Encounter (Signed)
Next appt 7/5  Last appt 4/4

## 2020-08-20 NOTE — Telephone Encounter (Signed)
Next appt 7/5  Last follow up 4/4

## 2020-08-22 ENCOUNTER — Other Ambulatory Visit (INDEPENDENT_AMBULATORY_CARE_PROVIDER_SITE_OTHER): Payer: Self-pay | Admitting: Family Medicine

## 2020-08-22 DIAGNOSIS — F4310 Post-traumatic stress disorder, unspecified: Secondary | ICD-10-CM

## 2020-08-22 DIAGNOSIS — F5104 Psychophysiologic insomnia: Secondary | ICD-10-CM

## 2020-08-22 MED ORDER — ZOLPIDEM 10 MG TABLET
ORAL_TABLET | ORAL | 2 refills | Status: DC
Start: 2020-08-22 — End: 2020-10-01

## 2020-08-22 NOTE — Telephone Encounter (Signed)
Last Visit:08/18/20    Upcoming appointments: 11/18/20          Mercy Riding Graft  08/22/2020, 10:09

## 2020-08-22 NOTE — Telephone Encounter (Signed)
Regarding: Talaman-Perez Pt // RX Refill Request  ----- Message from Dionne Bucy sent at 08/21/2020  3:31 PM EDT -----  Wynonia Hazard, MD    Pt called in requesting a refill for the following Rx:    Current Outpatient Medications:  zolpidem (AMBIEN) 10 mg Oral Tablet, take 1 tablet by mouth at bedtime if needed    Preferred Pharmacy     [facility] [place] [address]    [place] [address]    Phone: [phone] Fax: [phone]    Hours: Not open 24 hours        Thanks,  Dionne Bucy

## 2020-08-27 ENCOUNTER — Other Ambulatory Visit (INDEPENDENT_AMBULATORY_CARE_PROVIDER_SITE_OTHER): Payer: Self-pay | Admitting: NURSE PRACTITIONER

## 2020-08-27 NOTE — Telephone Encounter (Signed)
Last Visit:08/18/20    Upcoming appointments:  11/18/20          Mercy Riding Graft  08/27/2020, 10:58

## 2020-09-02 ENCOUNTER — Other Ambulatory Visit (INDEPENDENT_AMBULATORY_CARE_PROVIDER_SITE_OTHER): Payer: Self-pay | Admitting: INTERNAL MEDICINE

## 2020-09-02 NOTE — Telephone Encounter (Signed)
Last Visit:08/18/2020     Upcoming appointments: 11/18/2020        Erskine Speed, RN  09/02/2020, 13:54

## 2020-09-20 ENCOUNTER — Other Ambulatory Visit (INDEPENDENT_AMBULATORY_CARE_PROVIDER_SITE_OTHER): Payer: Self-pay | Admitting: Family Medicine

## 2020-09-22 ENCOUNTER — Ambulatory Visit (INDEPENDENT_AMBULATORY_CARE_PROVIDER_SITE_OTHER): Payer: Self-pay | Admitting: Internal Medicine

## 2020-09-22 NOTE — Telephone Encounter (Signed)
Last Visit:08/18/2020     Upcoming appointments: 11/18/2020           Erskine Speed, RN  09/22/2020, 11:09

## 2020-09-28 ENCOUNTER — Other Ambulatory Visit (INDEPENDENT_AMBULATORY_CARE_PROVIDER_SITE_OTHER): Payer: Self-pay | Admitting: INTERNAL MEDICINE

## 2020-09-29 NOTE — Telephone Encounter (Signed)
Last Visit:08/18/2020     Upcoming appointments: 11/18/2020           Coralee Pesa, [place]  09/29/2020, 16:17

## 2020-09-30 ENCOUNTER — Other Ambulatory Visit (INDEPENDENT_AMBULATORY_CARE_PROVIDER_SITE_OTHER): Payer: Self-pay | Admitting: NURSE PRACTITIONER

## 2020-10-01 ENCOUNTER — Other Ambulatory Visit (INDEPENDENT_AMBULATORY_CARE_PROVIDER_SITE_OTHER): Payer: Self-pay | Admitting: Family Medicine

## 2020-10-01 DIAGNOSIS — F5104 Psychophysiologic insomnia: Secondary | ICD-10-CM

## 2020-10-01 DIAGNOSIS — F4310 Post-traumatic stress disorder, unspecified: Secondary | ICD-10-CM

## 2020-10-01 NOTE — Telephone Encounter (Signed)
Will he try Belsomra to help his sleep?  Thank you

## 2020-10-01 NOTE — Telephone Encounter (Signed)
Last Visit:08/18/2020     Upcoming appointments: 11/18/2020      Erskine Speed, RN  10/01/2020, 12:39

## 2020-10-01 NOTE — Telephone Encounter (Signed)
Patient came in to the office - he is not willing to change this med

## 2020-10-01 NOTE — Telephone Encounter (Signed)
Left message on machine for patient to return call

## 2020-10-13 ENCOUNTER — Other Ambulatory Visit (INDEPENDENT_AMBULATORY_CARE_PROVIDER_SITE_OTHER): Payer: Self-pay | Admitting: Family Medicine

## 2020-10-21 ENCOUNTER — Other Ambulatory Visit (INDEPENDENT_AMBULATORY_CARE_PROVIDER_SITE_OTHER): Payer: Self-pay | Admitting: Family Medicine

## 2020-10-21 DIAGNOSIS — J4540 Moderate persistent asthma, uncomplicated: Secondary | ICD-10-CM

## 2020-10-23 ENCOUNTER — Other Ambulatory Visit (INDEPENDENT_AMBULATORY_CARE_PROVIDER_SITE_OTHER): Payer: Self-pay | Admitting: Family Medicine

## 2020-10-23 NOTE — Telephone Encounter (Signed)
Last Visit:4.4.22    Upcoming appointments: 7.5.22          Raiford Noble, MA  10/23/2020, 10:22

## 2020-10-30 ENCOUNTER — Other Ambulatory Visit (INDEPENDENT_AMBULATORY_CARE_PROVIDER_SITE_OTHER): Payer: Self-pay | Admitting: Family Medicine

## 2020-10-30 DIAGNOSIS — G8929 Other chronic pain: Secondary | ICD-10-CM

## 2020-10-30 DIAGNOSIS — M170 Bilateral primary osteoarthritis of knee: Secondary | ICD-10-CM

## 2020-10-30 DIAGNOSIS — M7918 Myalgia, other site: Secondary | ICD-10-CM

## 2020-10-30 NOTE — Telephone Encounter (Signed)
lv 08/18/20  Nv7/5/22

## 2020-11-05 ENCOUNTER — Other Ambulatory Visit (INDEPENDENT_AMBULATORY_CARE_PROVIDER_SITE_OTHER): Payer: Self-pay | Admitting: Family Medicine

## 2020-11-05 NOTE — Telephone Encounter (Signed)
Last Visit:4.4.22    Upcoming appointments: 7.5.22          Raiford Noble, [place]  11/05/2020, 14:03

## 2020-11-07 ENCOUNTER — Encounter (INDEPENDENT_AMBULATORY_CARE_PROVIDER_SITE_OTHER): Payer: Self-pay | Admitting: Neurological Surgery

## 2020-11-07 ENCOUNTER — Other Ambulatory Visit (INDEPENDENT_AMBULATORY_CARE_PROVIDER_SITE_OTHER): Payer: Self-pay

## 2020-11-16 ENCOUNTER — Other Ambulatory Visit (INDEPENDENT_AMBULATORY_CARE_PROVIDER_SITE_OTHER): Payer: Self-pay | Admitting: Family Medicine

## 2020-11-16 DIAGNOSIS — G8929 Other chronic pain: Secondary | ICD-10-CM

## 2020-11-16 DIAGNOSIS — M170 Bilateral primary osteoarthritis of knee: Secondary | ICD-10-CM

## 2020-11-17 ENCOUNTER — Other Ambulatory Visit (INDEPENDENT_AMBULATORY_CARE_PROVIDER_SITE_OTHER): Payer: Self-pay | Admitting: Family Medicine

## 2020-11-17 DIAGNOSIS — J4541 Moderate persistent asthma with (acute) exacerbation: Secondary | ICD-10-CM

## 2020-11-18 ENCOUNTER — Ambulatory Visit (INDEPENDENT_AMBULATORY_CARE_PROVIDER_SITE_OTHER): Payer: 59 | Admitting: Family Medicine

## 2020-11-18 ENCOUNTER — Encounter (INDEPENDENT_AMBULATORY_CARE_PROVIDER_SITE_OTHER): Payer: Self-pay | Admitting: Family Medicine

## 2020-11-18 ENCOUNTER — Other Ambulatory Visit: Payer: Self-pay

## 2020-11-18 VITALS — BP 132/90 | HR 69 | Temp 96.0°F | Ht 69.0 in | Wt 232.8 lb

## 2020-11-18 DIAGNOSIS — J4540 Moderate persistent asthma, uncomplicated: Secondary | ICD-10-CM

## 2020-11-18 DIAGNOSIS — Z125 Encounter for screening for malignant neoplasm of prostate: Secondary | ICD-10-CM

## 2020-11-18 DIAGNOSIS — R7303 Prediabetes: Secondary | ICD-10-CM

## 2020-11-18 DIAGNOSIS — E782 Mixed hyperlipidemia: Secondary | ICD-10-CM

## 2020-11-18 DIAGNOSIS — F4310 Post-traumatic stress disorder, unspecified: Secondary | ICD-10-CM

## 2020-11-18 DIAGNOSIS — K760 Fatty (change of) liver, not elsewhere classified: Secondary | ICD-10-CM

## 2020-11-18 DIAGNOSIS — I10 Essential (primary) hypertension: Secondary | ICD-10-CM

## 2020-11-18 DIAGNOSIS — E876 Hypokalemia: Secondary | ICD-10-CM | POA: Insufficient documentation

## 2020-11-18 DIAGNOSIS — F5104 Psychophysiologic insomnia: Secondary | ICD-10-CM

## 2020-11-18 HISTORY — DX: Hypokalemia: E87.6

## 2020-11-18 MED ORDER — METFORMIN 500 MG TABLET
500.0000 mg | ORAL_TABLET | Freq: Every morning | ORAL | 1 refills | Status: DC
Start: 2020-11-18 — End: 2021-04-29

## 2020-11-18 NOTE — Progress Notes (Signed)
Chief Complaint   Patient presents with   . Follow Up     Hypertension, hyperlipidemia, PTSD, insomnia, asthma, fatty liver, hypokalemia, prediabetes     Last office visit with the provider:08/18/2020     SUBJECTIVE:  Steve Young is a 60 y.o. male,White. Patient states he has a bruise behind left leg for a few months.  He has gained weight.  He states he had 2 doses of COVID-19 vaccine and was not aware of the booster.  He states he can get the COVID-19 booster at pharmacy.  He gets intermittent shortness of breath from asthma.  He is not short of breath today. He does not have the following symptoms today: fever, chills, cough, nasal congestion, sore throat, abdominal pain, headache, dizziness, eye pain or irritation, epistaxis, chest discomfort, stroke symptoms, wheezing or palpitations. Side effects of medications: no current medication side effects.     Current Outpatient Medications   Medication Sig   . albuterol sulfate (PROVENTIL OR VENTOLIN OR PROAIR) 90 mcg/actuation Inhalation HFA Aerosol Inhaler inhale 1 puff by mouth every 4 hours if needed wheezing   . amLODIPine (NORVASC) 10 mg Oral Tablet take 1 tablet by mouth once daily   . aspirin (ECOTRIN) 81 mg Oral Tablet, Delayed Release (E.C.) Take 1 Tablet (81 mg total) by mouth Once a day   . atorvastatin (LIPITOR) 80 mg Oral Tablet take 1 tablet by mouth at bedtime   . chlorthalidone (HYGROTON) 25 mg Oral Tablet take 1 tablet by mouth once daily   . cholecalciferol, vitamin D3, 125 mcg (5,000 unit) Oral Capsule take 1 capsule by mouth once daily   . diclofenac sodium (VOLTAREN) 1 % Gel by Apply Topically route Four times a day - before meals and bedtime   . folic acid (FOLVITE) 1 mg Oral Tablet take 1 tablet by mouth once daily   . gabapentin (NEURONTIN) 600 mg Oral Tablet take 1 tablet by mouth three times a day   . Ibuprofen (MOTRIN) 800 mg Oral Tablet take 1 tablet by mouth three times a day if needed for pain   . losartan (COZAAR) 100 mg Oral  Tablet take 1 tablet by mouth once daily   . lubiprostone (AMITIZA) 24 mcg Oral Capsule Take 1 Cap (24 mcg total) by mouth Twice daily   . magnesium oxide (MAG-OX) 400 mg (241.3 mg magnesium) Oral Tablet Take 400 mg by mouth   . methocarbamol (ROBAXIN) 500 mg Oral Tablet    . mupirocin (BACTROBAN) 2 % Ointment by Apply Topically route Three times a day   . omega-3 fatty acid (LOVAZA) 1 gram Oral Capsule take 2 capsules by mouth twice a day   . omeprazole (PRILOSEC) 40 mg Oral Capsule, Delayed Release(E.C.) take 1 capsule by mouth once daily   . oxyCODONE-acetaminophen (PERCOCET) 10-325 mg Oral Tablet take 1 tablet by mouth three times a day if needed for pain   . PARoxetine (PAXIL) 20 mg Oral Tablet Take 1 Tablet (20 mg total) by mouth Once a day   . Sildenafil (VIAGRA) 25 mg Oral Tablet take 1 tablet by mouth every 24 hours if needed   . SPIRIVA RESPIMAT 1.25 mcg/actuation Inhalation Mist inhale 1 puff by mouth and INTO THE LUNGS once daily   . SYMBICORT 160-4.5 mcg/actuation Inhalation HFA Aerosol Inhaler inhale 2 puffs by mouth twice a day Rinse mouth after use   . zafirlukast (ACCOLATE) 20 mg Oral Tablet take 1 tablet by mouth twice a day before meals   .  zolpidem (AMBIEN) 10 mg Oral Tablet take 1 tablet by mouth at bedtime if needed      Past Medical History:   Diagnosis Date   . Arthralgia of hip 03/21/2018   . Arthralgia of right upper arm 03/30/2016   . Asthma    . Atherosclerosis of both carotid arteries 03/04/2020   . Cervicalgia 03/21/2018   . Chronic abdominal pain 10/04/2018   . Chronic back pain 03/21/2018   . Chronic chest pain 03/21/2018   . Chronic insomnia    . Constipation in male 09/13/2017   . Diabetes mellitus, type 2 (CMS HCC)    . Esophageal reflux    . 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 Hepatic lesion 10/04/2018   . Hyperlipidemia    . Hypertension    . Impingement syndrome of left shoulder 12/28/2017   . Mild persistent asthma without complication 33/82/5053   . NAFL (nonalcoholic fatty  liver)    . Normal cardiac stress test 03/21/2018   . Nosebleed    . Obesity    . 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 Partial tear of common extensor tendon of elbow 12/13/2017   . Pituitary tumor    . Pre-diabetes    . Rib pain 10/04/2018   . Sexual dysfunction    . Tear of talofibular ligament 05/30/2013    RIGHT   . Vitamin D deficiency      No Known Allergies    Past Surgical History:   Procedure Laterality Date   . COLONOSCOPY     . ELBOW SURGERY Left 12/2017   . HX OTHER  2004     SPINAL FUSION,ANT,EA ADNL LEVEL c3-c7   . HX OTHER Right     right great toe   . HX SHOULDER SURGERY Bilateral    . NOSE SURGERY       Social History     Tobacco Use   . Smoking status: Never Smoker   . Smokeless tobacco: Never Used   Vaping Use   . Vaping Use: Never used   Substance Use Topics   . Alcohol use: Not Currently   . Drug use: Never     OBJECTIVE:   The patient appears to be in no acute distress.  Vitals: BP (!) 132/90 (Site: Left, Patient Position: Sitting, Cuff Size: Adult Large)   Pulse 69   Temp 35.6 C (96 F) (Thermal Scan)   Ht 1.753 m (5\' 9" )   Wt 106 kg (232 lb 12.8 oz)   SpO2 96%   BMI 34.38 kg/m   General: alert, no acute distress  Head: normocephalic, atraumatic  Eye: EOM intact, normal conjunctiva  Respiratory: Good diaphragmatic excursion. Lungs clear.  Heart: RRR  Extremities: Ambulatory without assist, no edema  Neurologic: Awake, alert, oriented, speech clear and coherent  Skin: No jaundice, warm, no significant bruising noted   Psychiatric: Cooperative, normal affect     ASSESSMENT AND PLAN:     ICD-10-CM    1. Essential hypertension  I10 COMPREHENSIVE METABOLIC PNL, FASTING     CBC  His blood pressure is elevated today.  Recommended weight loss to lower blood pressure.  Continue medications and lifestyle changes.   2. Mixed hyperlipidemia  E78.2 COMPREHENSIVE METABOLIC PNL, FASTING     LIPID PANEL  Needs to improve.  Continue atorvastatin and lifestyle changes.   3. PTSD (post-traumatic  stress disorder)  F43.10 COMPREHENSIVE METABOLIC PNL, FASTING     CBC  Stable on current therapy.  4. Chronic insomnia  F51.04 COMPREHENSIVE METABOLIC PNL, FASTING     CBC  Stable on current therapy.   5. Moderate persistent asthma without complication  T66.06 COMPREHENSIVE METABOLIC PNL, FASTING     CBC  Stable on current medications.   6. NAFL (nonalcoholic fatty liver)  Y04.5 COMPREHENSIVE METABOLIC PNL, FASTING  Stable.  Recommended weight loss.   7. Hypokalemia  E87.6 COMPREHENSIVE METABOLIC PNL, FASTING  Advised to eat fruits and vegetables regularly as part of healthy diet.   8. Prediabetes  R73.03 COMPREHENSIVE METABOLIC PNL, FASTING     HGA1C (HEMOGLOBIN A1C WITH EST AVG GLUCOSE)     metFORMIN (GLUCOPHAGE) 500 mg Oral Tablet - new  He would like to try medicines so he does not get diabetes.  He may try metformin daily.   9. Screening PSA (prostate specific antigen)  Z12.5 PSA TOTAL AND FREE  He has no urinary discomfort.     Data reviewed:  Glucose on 08/18/2020 was normal at 92, normal calcium of 9.2  Lab Results   Component Value Date/Time    HA1C 5.8 (H) 02/18/2020 01:26 PM    BUN 10 08/18/2020 10:15 PM    CREATININE 1.05 08/18/2020 10:15 PM    GFR >60 08/18/2020 10:15 PM    GLUCOSEFAST 114 03/05/2019 12:00 AM    SODIUM 138 08/18/2020 10:15 PM    POTASSIUM 2.9 (LL) 08/18/2020 10:15 PM    WBC 13.9 (H) 08/18/2020 10:15 PM    HGB 13.9 08/18/2020 10:15 PM    HCT 40.6 08/18/2020 10:15 PM    PLTCNT 257 08/18/2020 10:15 PM    AST 14 08/18/2020 10:15 PM    ALT 13 08/18/2020 10:15 PM    CHOLESTEROL 210 (H) 02/18/2020 01:26 PM    HDLCHOL 46 02/18/2020 01:26 PM    LDLCHOL 140 (H) 02/18/2020 01:26 PM    TRIG 121 02/18/2020 01:26 PM    TSH 0.562 06/11/2019 02:06 PM    MAGNESIUM 2.2 02/12/2020 12:50 PM     Orders Placed This Encounter   . COMPREHENSIVE METABOLIC PNL, FASTING   . CBC   . LIPID PANEL   . HGA1C (HEMOGLOBIN A1C WITH EST AVG GLUCOSE)   . PSA TOTAL AND FREE   . metFORMIN (GLUCOPHAGE) 500 mg Oral Tablet      He can get requested blood work within a month. Above plan was discussed with the patient and patient verbalized understanding. Patient agreed with above treatment and plan. Questions answered to patient's satisfaction. Risk, benefits, and alternatives to above treatment discussed with patient.   Follow-up in 3 months, sooner should new symptoms or problems arise.   He  was advised to contact the office if with any questions or concerns.    Jarome Lamas. Shona Simpson, MD  Note: This chart was transcribed using voice recognition software and may contain unintended word substitution or minor typographical errors.

## 2020-11-18 NOTE — Nursing Note (Signed)
Patient is here for a follow up. Patient is complaining of a bruise behind left leg for a few months. Patient states all his medications are the same.

## 2020-11-19 ENCOUNTER — Other Ambulatory Visit (INDEPENDENT_AMBULATORY_CARE_PROVIDER_SITE_OTHER): Payer: Self-pay | Admitting: Family Medicine

## 2020-11-19 DIAGNOSIS — J4541 Moderate persistent asthma with (acute) exacerbation: Secondary | ICD-10-CM

## 2020-11-19 NOTE — Telephone Encounter (Signed)
Last Visit:11/18/20     Upcoming appointments: 02/18/2021           Mercy Riding Graft  11/19/2020, 08:30

## 2020-11-19 NOTE — Telephone Encounter (Signed)
Last Visit:05/07/2019     Upcoming appointments: Visit date not found           Steve Young  11/19/2020, 14:35

## 2020-11-19 NOTE — Telephone Encounter (Signed)
Last Visit 11/18/20    Upcoming appointments: 02/18/21          Andree Coss, [place]  11/19/2020, 08:33

## 2020-12-05 ENCOUNTER — Other Ambulatory Visit (INDEPENDENT_AMBULATORY_CARE_PROVIDER_SITE_OTHER): Payer: Self-pay | Admitting: INTERNAL MEDICINE

## 2020-12-05 NOTE — Telephone Encounter (Signed)
Last Visit: 11/18/20     Upcoming appointments: 02/18/21          Andree Coss, [place]  12/05/2020, 10:51

## 2020-12-12 ENCOUNTER — Other Ambulatory Visit (INDEPENDENT_AMBULATORY_CARE_PROVIDER_SITE_OTHER): Payer: Self-pay | Admitting: Family Medicine

## 2020-12-12 NOTE — Telephone Encounter (Signed)
Last Visit:11/18/2020     Upcoming appointments: 02/18/2021          Steve Young  12/12/2020, 10:24

## 2020-12-14 ENCOUNTER — Other Ambulatory Visit (INDEPENDENT_AMBULATORY_CARE_PROVIDER_SITE_OTHER): Payer: Self-pay | Admitting: Family Medicine

## 2020-12-14 DIAGNOSIS — F4310 Post-traumatic stress disorder, unspecified: Secondary | ICD-10-CM

## 2020-12-15 NOTE — Telephone Encounter (Signed)
Last Visit:11/18/20    Upcoming appointments: 02/18/21        Andree Coss, [place]  12/15/2020, 09:05

## 2020-12-16 ENCOUNTER — Other Ambulatory Visit (INDEPENDENT_AMBULATORY_CARE_PROVIDER_SITE_OTHER): Payer: Self-pay | Admitting: Family Medicine

## 2020-12-16 DIAGNOSIS — F4310 Post-traumatic stress disorder, unspecified: Secondary | ICD-10-CM

## 2020-12-16 DIAGNOSIS — F5104 Psychophysiologic insomnia: Secondary | ICD-10-CM

## 2020-12-16 NOTE — Telephone Encounter (Signed)
Last Visit:11/18/20    Upcoming appointments: 02/18/21          Andree Coss, MA  12/16/2020, 09:55

## 2021-01-04 ENCOUNTER — Other Ambulatory Visit (INDEPENDENT_AMBULATORY_CARE_PROVIDER_SITE_OTHER): Payer: Self-pay | Admitting: Family Medicine

## 2021-01-04 DIAGNOSIS — F4310 Post-traumatic stress disorder, unspecified: Secondary | ICD-10-CM

## 2021-01-04 DIAGNOSIS — F5104 Psychophysiologic insomnia: Secondary | ICD-10-CM

## 2021-01-05 ENCOUNTER — Ambulatory Visit (INDEPENDENT_AMBULATORY_CARE_PROVIDER_SITE_OTHER): Payer: Medicare Other | Admitting: Family Medicine

## 2021-01-05 ENCOUNTER — Encounter (INDEPENDENT_AMBULATORY_CARE_PROVIDER_SITE_OTHER): Payer: Self-pay | Admitting: Family Medicine

## 2021-01-05 ENCOUNTER — Other Ambulatory Visit: Payer: Self-pay

## 2021-01-05 VITALS — BP 138/84 | HR 83 | Ht 69.0 in | Wt 231.2 lb

## 2021-01-05 DIAGNOSIS — L03211 Cellulitis of face: Secondary | ICD-10-CM

## 2021-01-05 MED ORDER — PENICILLIN V POTASSIUM 500 MG TABLET
500.0000 mg | ORAL_TABLET | Freq: Four times a day (QID) | ORAL | 0 refills | Status: AC
Start: 2021-01-05 — End: 2021-01-15

## 2021-01-05 NOTE — Telephone Encounter (Signed)
Last Visit:Visit date not found     Upcoming appointments: 02/18/2021          Steve Young  01/05/2021, 11:58

## 2021-01-05 NOTE — Nursing Note (Signed)
Patient here for infected tooth x 5 days on right side of face, swelling started x 2 days ago

## 2021-01-05 NOTE — Progress Notes (Signed)
Chief Complaint   Patient presents with   . Facial Pain     Last office visit with the provider:11/18/2020     SUBJECTIVE:  Steve Young is a 60 y.o. male,White. Patient is having infected tooth for 5 days on the right side and his face is swollen which started 2 days ago after eating tomato sandwich.  He states he called his dentist but did not go because it will cost him $300. He does not have the following symptoms today: fever, chills, cough, nasal congestion, sore throat, abdominal pain, headache, dizziness, eye pain or irritation, epistaxis, chest discomfort, stroke symptoms, shortness of breath, dyspnea on exertion or palpitations. Side effects of medications: no current medication side effects.     Current Outpatient Medications   Medication Sig   . albuterol sulfate (PROVENTIL OR VENTOLIN OR PROAIR) 90 mcg/actuation Inhalation HFA Aerosol Inhaler inhale 1 puff by mouth every 4 hours if needed wheezing   . amLODIPine (NORVASC) 10 mg Oral Tablet take 1 tablet by mouth once daily   . aspirin (ECOTRIN) 81 mg Oral Tablet, Delayed Release (E.C.) Take 1 Tablet (81 mg total) by mouth Once a day   . atorvastatin (LIPITOR) 80 mg Oral Tablet take 1 tablet by mouth at bedtime   . chlorthalidone (HYGROTON) 25 mg Oral Tablet take 1 tablet by mouth once daily   . cholecalciferol, vitamin D3, 125 mcg (5,000 unit) Oral Capsule take 1 capsule by mouth once daily   . diclofenac sodium (VOLTAREN) 1 % Gel by Apply Topically route Four times a day - before meals and bedtime   . folic acid (FOLVITE) 1 mg Oral Tablet take 1 tablet by mouth once daily   . gabapentin (NEURONTIN) 600 mg Oral Tablet take 1 tablet by mouth three times a day   . Ibuprofen (MOTRIN) 800 mg Oral Tablet take 1 tablet by mouth three times a day if needed for pain   . losartan (COZAAR) 100 mg Oral Tablet take 1 tablet by mouth once daily   . lubiprostone (AMITIZA) 24 mcg Oral Capsule Take 1 Cap (24 mcg total) by mouth Twice daily   . magnesium oxide  (MAG-OX) 400 mg (241.3 mg magnesium) Oral Tablet Take 400 mg by mouth   . metFORMIN (GLUCOPHAGE) 500 mg Oral Tablet Take 1 Tablet (500 mg total) by mouth Every morning with breakfast Indications: prevention of type 2 diabetes mellitus   . methocarbamol (ROBAXIN) 500 mg Oral Tablet    . mupirocin (BACTROBAN) 2 % Ointment by Apply Topically route Three times a day   . omega-3 fatty acid (LOVAZA) 1 gram Oral Capsule take 2 capsules by mouth twice a day   . omeprazole (PRILOSEC) 40 mg Oral Capsule, Delayed Release(E.C.) take 1 capsule by mouth once daily   . oxyCODONE-acetaminophen (PERCOCET) 10-325 mg Oral Tablet take 1 tablet by mouth three times a day if needed for pain   . PARoxetine (PAXIL) 20 mg Oral Tablet take 1 tablet by mouth once daily   . Sildenafil (VIAGRA) 25 mg Oral Tablet take 1 tablet by mouth every 24 hours if needed   . SPIRIVA RESPIMAT 1.25 mcg/actuation Inhalation Mist inhale 1 puff by mouth and INTO THE LUNGS once daily   . SYMBICORT 160-4.5 mcg/actuation Inhalation HFA Aerosol Inhaler inhale 2 puffs by mouth twice a day Rinse mouth after use   . zafirlukast (ACCOLATE) 20 mg Oral Tablet take 1 tablet by mouth twice a day before meals   . zolpidem (AMBIEN) 10  mg Oral Tablet take 1 tablet by mouth at bedtime if needed      No Known Allergies    Past Surgical History:   Procedure Laterality Date   . COLONOSCOPY     . ELBOW SURGERY Left 12/2017   . HX OTHER  2004     SPINAL FUSION,ANT,EA ADNL LEVEL c3-c7   . HX OTHER Right     right great toe   . HX SHOULDER SURGERY Bilateral    . NOSE SURGERY       Social History     Tobacco Use   . Smoking status: Never Smoker   . Smokeless tobacco: Never Used   Vaping Use   . Vaping Use: Never used   Substance Use Topics   . Alcohol use: Not Currently   . Drug use: Never     OBJECTIVE:   The patient appears to be in no acute distress.  Vitals: BP 138/84   Pulse 83   Ht 1.753 m ('5\' 9"'$ )   Wt 105 kg (231 lb 3.2 oz)   SpO2 98%   BMI 34.14 kg/m   General: alert, no  acute distress  Head: normocephalic, atraumatic  Eye: EOM intact, normal conjunctiva  Respiratory: Good diaphragmatic excursion. Lungs clear.  Heart: RRR  Extremities: Ambulatory with normal gait, no edema  Neurologic: Awake, alert, oriented, speech clear and coherent  Skin: No jaundice, warm, right facial swelling and redness   Psychiatric: Cooperative, normal judgment    ASSESSMENT AND PLAN:     ICD-10-CM    1. Facial cellulitis  L03.211 penicillin V potassium (VEETID) 500 mg Oral Tablet  He may take antibiotic for 7-10 days and will call if not better. Follow-up with dentist if having persistent dental problems.     Data reviewed:   Lab Results   Component Value Date/Time    BUN 10 08/18/2020 10:15 PM    CREATININE 1.05 08/18/2020 10:15 PM    GFR >60 08/18/2020 10:15 PM    TSH 0.562 06/11/2019 02:06 PM     Orders Placed This Encounter   . penicillin V potassium (VEETID) 500 mg Oral Tablet     Above plan was discussed with the patient and patient verbalized understanding. Patient agreed with above treatment and plan. Questions answered to patient's satisfaction. Risk, benefits, and alternatives to above treatment discussed with patient.   Follow-up in October which is already scheduled, sooner should new symptoms or problems arise.   He  was advised to contact the office if with any questions or concerns.    Jarome Lamas. Shona Simpson, MD  Note: This chart was transcribed using voice recognition software and may contain unintended word substitution or minor typographical errors.

## 2021-01-09 ENCOUNTER — Other Ambulatory Visit (INDEPENDENT_AMBULATORY_CARE_PROVIDER_SITE_OTHER): Payer: Self-pay | Admitting: Family Medicine

## 2021-01-09 NOTE — Telephone Encounter (Signed)
Last Visit:01/05/2021     Upcoming appointments: 02/18/2021           Andree Coss, [place]  01/09/2021, 12:37

## 2021-01-12 ENCOUNTER — Other Ambulatory Visit (INDEPENDENT_AMBULATORY_CARE_PROVIDER_SITE_OTHER): Payer: Self-pay | Admitting: Family Medicine

## 2021-01-12 DIAGNOSIS — G8929 Other chronic pain: Secondary | ICD-10-CM

## 2021-01-12 DIAGNOSIS — M170 Bilateral primary osteoarthritis of knee: Secondary | ICD-10-CM

## 2021-01-12 DIAGNOSIS — M7918 Myalgia, other site: Secondary | ICD-10-CM

## 2021-01-12 NOTE — Telephone Encounter (Signed)
Last Visit:01/05/2021     Upcoming appointments: 02/18/2021           [name], [place]  01/12/2021, 09:49

## 2021-01-23 ENCOUNTER — Other Ambulatory Visit (INDEPENDENT_AMBULATORY_CARE_PROVIDER_SITE_OTHER): Payer: Self-pay | Admitting: Family Medicine

## 2021-01-23 DIAGNOSIS — F4310 Post-traumatic stress disorder, unspecified: Secondary | ICD-10-CM

## 2021-01-23 DIAGNOSIS — F5104 Psychophysiologic insomnia: Secondary | ICD-10-CM

## 2021-01-23 NOTE — Telephone Encounter (Signed)
Last Visit:01/05/21    Upcoming appointments: 02/18/2021          Andree Coss, [place]  01/23/2021, 10:17

## 2021-01-25 ENCOUNTER — Other Ambulatory Visit (INDEPENDENT_AMBULATORY_CARE_PROVIDER_SITE_OTHER): Payer: Self-pay | Admitting: Family Medicine

## 2021-01-26 NOTE — Telephone Encounter (Signed)
Last Visit:01/05/2021     Upcoming appointments: 02/18/2021           Steve Young, [place]  01/26/2021, 09:34

## 2021-01-31 ENCOUNTER — Other Ambulatory Visit (INDEPENDENT_AMBULATORY_CARE_PROVIDER_SITE_OTHER): Payer: Self-pay | Admitting: Family Medicine

## 2021-02-02 NOTE — Telephone Encounter (Signed)
Last Visit:01/05/21    Upcoming appointments: 02/18/2021          Andree Coss, MA  02/02/2021, 08:51

## 2021-02-03 ENCOUNTER — Other Ambulatory Visit (INDEPENDENT_AMBULATORY_CARE_PROVIDER_SITE_OTHER): Payer: Self-pay | Admitting: Family Medicine

## 2021-02-03 DIAGNOSIS — J4540 Moderate persistent asthma, uncomplicated: Secondary | ICD-10-CM

## 2021-02-03 NOTE — Telephone Encounter (Signed)
Last Visit:01/05/2021     Upcoming appointments: 02/18/2021           Steve Young, [place]  02/03/2021, 09:56

## 2021-02-11 ENCOUNTER — Other Ambulatory Visit (INDEPENDENT_AMBULATORY_CARE_PROVIDER_SITE_OTHER): Payer: Self-pay | Admitting: Family Medicine

## 2021-02-11 DIAGNOSIS — F5104 Psychophysiologic insomnia: Secondary | ICD-10-CM

## 2021-02-11 DIAGNOSIS — F4310 Post-traumatic stress disorder, unspecified: Secondary | ICD-10-CM

## 2021-02-11 NOTE — Telephone Encounter (Signed)
He has been on Ambien for a long time. Is  he willing to see the new sleep medicine specialist?  Thank you

## 2021-02-11 NOTE — Telephone Encounter (Signed)
Last Visit: 01/05/21    Next Visit: 02/18/2021    Gaylord Shih, MA  02/11/2021 10:16

## 2021-02-11 NOTE — Telephone Encounter (Signed)
Called pt and left vm to call back regarding medications.

## 2021-02-12 NOTE — Telephone Encounter (Signed)
Patient is aware and verbally understands. Pt states that he wants to think about it and call us back when he finds out more information about the sleep specialist.

## 2021-02-16 ENCOUNTER — Encounter (INDEPENDENT_AMBULATORY_CARE_PROVIDER_SITE_OTHER): Payer: Self-pay | Admitting: Family Medicine

## 2021-02-17 ENCOUNTER — Encounter (INDEPENDENT_AMBULATORY_CARE_PROVIDER_SITE_OTHER): Payer: Self-pay | Admitting: Family Medicine

## 2021-02-18 ENCOUNTER — Ambulatory Visit (INDEPENDENT_AMBULATORY_CARE_PROVIDER_SITE_OTHER): Payer: MEDICAID | Admitting: Family Medicine

## 2021-02-18 ENCOUNTER — Encounter (INDEPENDENT_AMBULATORY_CARE_PROVIDER_SITE_OTHER): Payer: Self-pay | Admitting: Family Medicine

## 2021-02-18 ENCOUNTER — Other Ambulatory Visit: Payer: Self-pay

## 2021-02-18 VITALS — BP 122/84 | HR 87 | Temp 96.4°F | Ht 69.0 in | Wt 230.0 lb

## 2021-02-18 DIAGNOSIS — E782 Mixed hyperlipidemia: Secondary | ICD-10-CM

## 2021-02-18 DIAGNOSIS — F4310 Post-traumatic stress disorder, unspecified: Secondary | ICD-10-CM

## 2021-02-18 DIAGNOSIS — K760 Fatty (change of) liver, not elsewhere classified: Secondary | ICD-10-CM

## 2021-02-18 DIAGNOSIS — F5104 Psychophysiologic insomnia: Secondary | ICD-10-CM

## 2021-02-18 DIAGNOSIS — I10 Essential (primary) hypertension: Secondary | ICD-10-CM

## 2021-02-18 DIAGNOSIS — M5030 Other cervical disc degeneration, unspecified cervical region: Secondary | ICD-10-CM

## 2021-02-18 DIAGNOSIS — E876 Hypokalemia: Secondary | ICD-10-CM

## 2021-02-18 DIAGNOSIS — R7303 Prediabetes: Secondary | ICD-10-CM

## 2021-02-18 DIAGNOSIS — M170 Bilateral primary osteoarthritis of knee: Secondary | ICD-10-CM

## 2021-02-18 DIAGNOSIS — Z125 Encounter for screening for malignant neoplasm of prostate: Secondary | ICD-10-CM

## 2021-02-18 DIAGNOSIS — J4540 Moderate persistent asthma, uncomplicated: Secondary | ICD-10-CM

## 2021-02-18 DIAGNOSIS — L989 Disorder of the skin and subcutaneous tissue, unspecified: Secondary | ICD-10-CM

## 2021-02-18 MED ORDER — CYCLOBENZAPRINE 10 MG TABLET
10.0000 mg | ORAL_TABLET | Freq: Every evening | ORAL | 2 refills | Status: DC | PRN
Start: 2021-02-18 — End: 2023-01-10

## 2021-02-18 MED ORDER — TRIAMTERENE 37.5 MG-HYDROCHLOROTHIAZIDE 25 MG TABLET
1.0000 | ORAL_TABLET | Freq: Every morning | ORAL | 3 refills | Status: DC
Start: 2021-02-18 — End: 2022-01-14

## 2021-02-18 NOTE — Nursing Note (Signed)
Patient is here for a follow up. Patient is complaining of a sore on the top of his head for a couple of months.

## 2021-02-18 NOTE — Progress Notes (Signed)
Chief Complaint   Patient presents with   . Follow Up     Asthma, hypertension, hyperlipidemia, fatty liver, PTSD, insomnia, osteoarthritis   . Skin Problem     Last office visit with the provider:01/05/2021     SUBJECTIVE:  Steve Young is a 60 y.o. male,White. Patient is having sore on the top of his head for a couple of months which is nonhealing.  His weight is stable. He does not have the following symptoms today: fever, chills, cough, nasal congestion, sore throat, abdominal pain, headache, dizziness, eye pain or irritation, epistaxis, chest discomfort, stroke symptoms, shortness of breath, dyspnea on exertion or palpitations. Side effects of medications: no current medication side effects.     Current Outpatient Medications   Medication Sig   . albuterol sulfate (PROVENTIL OR VENTOLIN OR PROAIR) 90 mcg/actuation Inhalation HFA Aerosol Inhaler inhale 1 puff by mouth every 4 hours if needed wheezing   . amLODIPine (NORVASC) 10 mg Oral Tablet take 1 tablet by mouth once daily   . aspirin (ECOTRIN) 81 mg Oral Tablet, Delayed Release (E.C.) Take 1 Tablet (81 mg total) by mouth Once a day   . atorvastatin (LIPITOR) 80 mg Oral Tablet take 1 tablet by mouth at bedtime   . chlorTHALIDONE (HYGROTON) 25 mg Oral Tablet take 1 tablet by mouth once daily   . cholecalciferol, vitamin D3, 125 mcg (5,000 unit) Oral Capsule take 1 capsule by mouth once daily   . diclofenac sodium (VOLTAREN) 1 % Gel by Apply Topically route Four times a day - before meals and bedtime   . folic acid (FOLVITE) 1 mg Oral Tablet take 1 tablet by mouth once daily   . gabapentin (NEURONTIN) 600 mg Oral Tablet take 1 tablet by mouth three times a day   . Ibuprofen (MOTRIN) 800 mg Oral Tablet take 1 tablet by mouth three times a day if needed for pain   . losartan (COZAAR) 100 mg Oral Tablet take 1 tablet by mouth once daily   . lubiprostone (AMITIZA) 24 mcg Oral Capsule Take 1 Cap (24 mcg total) by mouth Twice daily   . magnesium oxide (MAG-OX)  400 mg (241.3 mg magnesium) Oral Tablet Take 400 mg by mouth   . metFORMIN (GLUCOPHAGE) 500 mg Oral Tablet Take 1 Tablet (500 mg total) by mouth Every morning with breakfast Indications: prevention of type 2 diabetes mellitus   . methocarbamol (ROBAXIN) 500 mg Oral Tablet    . mupirocin (BACTROBAN) 2 % Ointment by Apply Topically route Three times a day   . omega-3 fatty acid (LOVAZA) 1 gram Oral Capsule take 2 capsules by mouth twice a day   . omeprazole (PRILOSEC) 40 mg Oral Capsule, Delayed Release(E.C.) take 1 capsule by mouth once daily   . oxyCODONE-acetaminophen (PERCOCET) 10-325 mg Oral Tablet take 1 tablet by mouth three times a day if needed for pain   . PARoxetine (PAXIL) 20 mg Oral Tablet take 1 tablet by mouth once daily   . Sildenafil (VIAGRA) 25 mg Oral Tablet take 1 tablet by mouth every 24 hours if needed   . SPIRIVA RESPIMAT 1.25 mcg/actuation Inhalation Mist inhale 1 puff by mouth and INTO THE LUNGS once daily   . SYMBICORT 160-4.5 mcg/actuation Inhalation HFA Aerosol Inhaler inhale 2 puffs by mouth twice a day Rinse mouth after use   . zafirlukast (ACCOLATE) 20 mg Oral Tablet take 1 tablet by mouth twice a day before meals   . zolpidem (AMBIEN) 10 mg Oral Tablet  take 1 tablet by mouth at bedtime if needed      No Known Allergies    Past Surgical History:   Procedure Laterality Date   . COLONOSCOPY     . ELBOW SURGERY Left 12/2017   . HX OTHER  2004     SPINAL FUSION,ANT,EA ADNL LEVEL c3-c7   . HX OTHER Right     right great toe   . HX SHOULDER SURGERY Bilateral    . NOSE SURGERY       Social History     Tobacco Use   . Smoking status: Never Smoker   . Smokeless tobacco: Never Used   Vaping Use   . Vaping Use: Never used   Substance Use Topics   . Alcohol use: Not Currently   . Drug use: Never     OBJECTIVE:   The patient appears to be in no acute distress.  Vitals: BP 122/84 (Site: Left, Patient Position: Sitting, Cuff Size: Adult Large)   Pulse 87   Temp 35.8 C (96.4 F) (Thermal Scan)   Ht  1.753 m (5\' 9" )   Wt 104 kg (230 lb)   SpO2 97%   BMI 33.97 kg/m   General: alert, no acute distress  Head: normocephalic, atraumatic  Eye: EOM intact, normal conjunctiva  Respiratory: Good diaphragmatic excursion. Lungs clear.  Heart: RRR  Extremities: Ambulatory with normal gait, no edema  Neurologic: Awake, alert, oriented, speech clear and coherent  Skin: No jaundice, warm, irritated dark raised lesion on top of head   Psychiatric: Cooperative, normal judgment, pleasant    ASSESSMENT AND PLAN:     ICD-10-CM    1. Moderate persistent asthma without complication  H47.42 COMPREHENSIVE METABOLIC PNL, FASTING     CBC  Stable on current medications.   2. Essential hypertension  I10 triamterene-hydroCHLOROthiazide (MAXZIDE-25) 37.5-25 mg Oral Tablet - new     COMPREHENSIVE METABOLIC PNL, FASTING     CBC  Stable. Will change chlorthalidone to potassium-sparing diuretic because of hypokalemia.   3. Mixed hyperlipidemia  E78.2 COMPREHENSIVE METABOLIC PNL, FASTING     LIPID PANEL  Continue medications and lifestyle changes.   4. NAFL (nonalcoholic fatty liver)  V95.6 COMPREHENSIVE METABOLIC PNL, FASTING     CBC     [place] RT Upper Quadrant  Recommended to continue weight loss.   5. PTSD (post-traumatic stress disorder)  F43.10 COMPREHENSIVE METABOLIC PNL, FASTING  Stable on current medications.   6. Psychophysiological insomnia  F51.04 COMPREHENSIVE METABOLIC PNL, FASTING     CBC     Refer to [facility]  He has been on Ambien for a long time because he states that Ambien helps him sleep better. He has no suspected abuse of medicine.   7. Primary osteoarthritis of both knees  M17.0 COMPREHENSIVE METABOLIC PNL, FASTING  He states his knee pain has improved.   8. Hypokalemia  E87.6 triamterene-hydroCHLOROthiazide (MAXZIDE-25) 37.5-25 mg Oral Tablet     COMPREHENSIVE METABOLIC PNL, FASTING  He states he has been eating fruits and vegetables regularly.   9. Non-healing skin lesion  L98.9 Referral to External Provider (AMB) -  dermatology   10. Prediabetes  R73.03 COMPREHENSIVE METABOLIC PNL, FASTING     HGA1C (HEMOGLOBIN A1C WITH EST AVG GLUCOSE)  Continue metformin and lifestyle changes.   11. DDD (degenerative disc disease), cervical  M50.30 cyclobenzaprine (FLEXERIL) 10 mg Oral Tablet - new  He is getting neck spasms and the Robaxin is not helping.  He may try Flexeril  at night and stop the Robaxin. He has done physical therapy without relief. Continue pain management.   12. Screening PSA (prostate specific antigen)  Z12.5 PSA TOTAL AND FREE  He has no urinary discomfort.     Data reviewed:  Glucose on 08/18/2020 was normal at 92, normal calcium of 9.2  Lab Results   Component Value Date/Time    HA1C 5.8 (H) 02/18/2020 01:26 PM    BUN 10 08/18/2020 10:15 PM    CREATININE 1.05 08/18/2020 10:15 PM    GFR >60 08/18/2020 10:15 PM    GLUCOSEFAST 114 03/05/2019 12:00 AM    SODIUM 138 08/18/2020 10:15 PM    POTASSIUM 2.9 (LL) 08/18/2020 10:15 PM    WBC 13.9 (H) 08/18/2020 10:15 PM    HGB 13.9 08/18/2020 10:15 PM    HCT 40.6 08/18/2020 10:15 PM    PLTCNT 257 08/18/2020 10:15 PM    AST 14 08/18/2020 10:15 PM    ALT 13 08/18/2020 10:15 PM    CHOLESTEROL 210 (H) 02/18/2020 01:26 PM    HDLCHOL 46 02/18/2020 01:26 PM    LDLCHOL 140 (H) 02/18/2020 01:26 PM    TRIG 121 02/18/2020 01:26 PM    TSH 0.562 06/11/2019 02:06 PM    MAGNESIUM 2.2 02/12/2020 12:50 PM     Orders Placed This Encounter   . [place] RT Upper Quadrant   . COMPREHENSIVE METABOLIC PNL, FASTING   . CBC   . HGA1C (HEMOGLOBIN A1C WITH EST AVG GLUCOSE)   . LIPID PANEL   . PSA TOTAL AND FREE   . Referral to External Provider (AMB) - dermatology   . Refer to [facility]   . triamterene-hydroCHLOROthiazide (MAXZIDE-25) 37.5-25 mg Oral Tablet   . cyclobenzaprine (FLEXERIL) 10 mg Oral Tablet     Advised to get requested blood work this month. Above plan was discussed with the patient and patient verbalized understanding. Patient agreed with above treatment and plan. Questions answered to  patient's satisfaction. Risk, benefits, and alternatives to above treatment discussed with patient.   Follow-up in 6 weeks, sooner should new symptoms or problems arise.   He  was advised to contact the office if with any questions or concerns.    Jarome Lamas. Shona Simpson, MD  Note: This chart was transcribed using voice recognition software and may contain unintended word substitution or minor typographical errors.

## 2021-02-20 ENCOUNTER — Telehealth (HOSPITAL_COMMUNITY): Payer: Self-pay | Admitting: Student in an Organized Health Care Education/Training Program

## 2021-02-27 ENCOUNTER — Telehealth (HOSPITAL_COMMUNITY): Payer: Self-pay | Admitting: Student in an Organized Health Care Education/Training Program

## 2021-03-02 ENCOUNTER — Other Ambulatory Visit (INDEPENDENT_AMBULATORY_CARE_PROVIDER_SITE_OTHER): Payer: Self-pay | Admitting: Family Medicine

## 2021-03-02 ENCOUNTER — Telehealth (HOSPITAL_COMMUNITY): Payer: Self-pay | Admitting: Student in an Organized Health Care Education/Training Program

## 2021-03-02 DIAGNOSIS — F5104 Psychophysiologic insomnia: Secondary | ICD-10-CM

## 2021-03-02 DIAGNOSIS — F4310 Post-traumatic stress disorder, unspecified: Secondary | ICD-10-CM

## 2021-03-02 NOTE — Telephone Encounter (Signed)
Last Visit:02/18/21    Upcoming appointments: 04/01/21          [name], [place]  03/02/2021, 11:58

## 2021-03-08 ENCOUNTER — Other Ambulatory Visit (INDEPENDENT_AMBULATORY_CARE_PROVIDER_SITE_OTHER): Payer: Self-pay | Admitting: Family Medicine

## 2021-03-08 DIAGNOSIS — M170 Bilateral primary osteoarthritis of knee: Secondary | ICD-10-CM

## 2021-03-08 DIAGNOSIS — G8929 Other chronic pain: Secondary | ICD-10-CM

## 2021-03-09 ENCOUNTER — Other Ambulatory Visit: Payer: Self-pay

## 2021-03-09 ENCOUNTER — Ambulatory Visit: Payer: Medicare (Managed Care) | Attending: Family Medicine

## 2021-03-09 DIAGNOSIS — M170 Bilateral primary osteoarthritis of knee: Secondary | ICD-10-CM

## 2021-03-09 DIAGNOSIS — E782 Mixed hyperlipidemia: Secondary | ICD-10-CM | POA: Insufficient documentation

## 2021-03-09 DIAGNOSIS — F5104 Psychophysiologic insomnia: Secondary | ICD-10-CM | POA: Insufficient documentation

## 2021-03-09 DIAGNOSIS — Z125 Encounter for screening for malignant neoplasm of prostate: Secondary | ICD-10-CM

## 2021-03-09 DIAGNOSIS — G8929 Other chronic pain: Secondary | ICD-10-CM

## 2021-03-09 DIAGNOSIS — I10 Essential (primary) hypertension: Secondary | ICD-10-CM | POA: Insufficient documentation

## 2021-03-09 DIAGNOSIS — M7918 Myalgia, other site: Secondary | ICD-10-CM | POA: Insufficient documentation

## 2021-03-09 DIAGNOSIS — J4540 Moderate persistent asthma, uncomplicated: Secondary | ICD-10-CM | POA: Insufficient documentation

## 2021-03-09 DIAGNOSIS — K760 Fatty (change of) liver, not elsewhere classified: Secondary | ICD-10-CM

## 2021-03-09 DIAGNOSIS — R7303 Prediabetes: Secondary | ICD-10-CM | POA: Insufficient documentation

## 2021-03-09 DIAGNOSIS — F4310 Post-traumatic stress disorder, unspecified: Secondary | ICD-10-CM | POA: Insufficient documentation

## 2021-03-09 DIAGNOSIS — E876 Hypokalemia: Secondary | ICD-10-CM

## 2021-03-09 LAB — CBC
HCT: 38.9 % (ref 38.9–52.0)
HGB: 13.2 g/dL — ABNORMAL LOW (ref 13.4–17.5)
MCH: 28.7 pg (ref 26.0–32.0)
MCHC: 33.9 g/dL (ref 31.0–35.5)
MCV: 84.6 fL (ref 78.0–100.0)
MPV: 11.1 fL (ref 8.7–12.5)
PLATELETS: 225 10*3/uL (ref 150–400)
RBC: 4.6 10*6/uL (ref 4.50–6.10)
RDW-CV: 13.9 % (ref 11.5–15.5)
WBC: 9 10*3/uL (ref 3.7–11.0)

## 2021-03-09 LAB — COMPREHENSIVE METABOLIC PNL, FASTING
ALBUMIN/GLOBULIN RATIO: 1.6 (ref >=1.0)
ALBUMIN: 3.9 g/dL (ref 3.5–5.2)
ALKALINE PHOSPHATASE: 54 U/L (ref 41–133)
ALT (SGPT): 20 U/L (ref 0–55)
ANION GAP: 8 mmol/L (ref 6–15)
AST (SGOT): 16 U/L (ref 5–34)
BILIRUBIN TOTAL: 0.8 mg/dL (ref 0.2–1.2)
BUN: 14 mg/dL (ref 7–21)
CALCIUM: 8.9 mg/dL (ref 8.0–10.6)
CHLORIDE: 103 mmol/L (ref 98–107)
CO2 TOTAL: 27 mmol/L (ref 21–32)
CREATININE: 1.12 mg/dL (ref 0.80–1.60)
ESTIMATED GFR: >60 mL/min/{1.73_m2}
GLUCOSE: 106 mg/dL — ABNORMAL HIGH (ref 70–100)
POTASSIUM: 3.2 mmol/L — ABNORMAL LOW (ref 3.3–5.1)
PROTEIN TOTAL: 6.4 g/dL (ref 6.4–8.3)
SODIUM: 138 mmol/L (ref 136–146)

## 2021-03-09 LAB — LIPID PANEL
CHOL/HDL RATIO: 3.9
CHOLESTEROL: 137 mg/dL (ref 75–200)
HDL CHOL: 35 mg/dL (ref 27–58)
LDL CALC: 80 mg/dL (ref 0–99)
TRIGLYCERIDES: 109 mg/dL (ref 28–153)
VLDL CALC: 22 mg/dL (ref 0–50)

## 2021-03-09 NOTE — Telephone Encounter (Signed)
Last Visit:02/18/2021     Upcoming appointments: 04/01/2021           [name], [place]  03/09/2021, 09:00

## 2021-03-10 LAB — PSA TOTAL AND FREE
PSA, % FREE: 16 % (calc) — ABNORMAL LOW (ref >25)
PSA, FREE: 0.3 ng/mL
PSA, TOTAL: 1.9 ng/mL (ref <=4.0)

## 2021-03-10 LAB — HGA1C (HEMOGLOBIN A1C WITH EST AVG GLUCOSE): HEMOGLOBIN A1C: 5.8 % (ref 4.0–6.0)

## 2021-03-11 ENCOUNTER — Ambulatory Visit (INDEPENDENT_AMBULATORY_CARE_PROVIDER_SITE_OTHER): Payer: Self-pay | Admitting: Family Medicine

## 2021-03-11 ENCOUNTER — Emergency Department (HOSPITAL_COMMUNITY): Payer: MEDICAID

## 2021-03-11 ENCOUNTER — Emergency Department (HOSPITAL_COMMUNITY): Payer: Medicare (Managed Care)

## 2021-03-11 ENCOUNTER — Emergency Department
Admission: EM | Admit: 2021-03-11 | Discharge: 2021-03-11 | Disposition: A | Discharge status: Home / Self Care | Payer: Medicare (Managed Care) | Attending: Emergency Medicine | Admitting: Emergency Medicine

## 2021-03-11 ENCOUNTER — Encounter (HOSPITAL_COMMUNITY): Payer: Self-pay

## 2021-03-11 ENCOUNTER — Other Ambulatory Visit: Payer: Self-pay

## 2021-03-11 DIAGNOSIS — R519 Headache, unspecified: Secondary | ICD-10-CM

## 2021-03-11 DIAGNOSIS — R200 Anesthesia of skin: Secondary | ICD-10-CM

## 2021-03-11 DIAGNOSIS — R202 Paresthesia of skin: Secondary | ICD-10-CM | POA: Insufficient documentation

## 2021-03-11 DIAGNOSIS — Z981 Arthrodesis status: Secondary | ICD-10-CM | POA: Insufficient documentation

## 2021-03-11 NOTE — ED Provider Notes (Signed)
Emergency Department  Provider Note    Name: Steve Young  Age and Gender: 60 y.o. male  Date of Birth: 02-08-1961  Date of Service: 03/11/2021  MRN: [id number]  PCP: Wynonia Hazard, MD    Arrival: The patient arrived by private car and is alone.    History Obtained by: provider  History Limitations: none    CC:  Chief Complaint   Patient presents with   . Headache   . Facial Numbness/tingling     HPI:  Steve Young is a 60 y.o. White male presenting with headache and bilateral arm numbness and tingling.  His symptoms started several weeks ago, gradually worsening.  He also reports several days of nausea and vomiting that are associated with his neck pain.  No headache.  No fevers or chills.  No trauma to the area.  However, patient does report prior cervical spinal fusions in [place].    ROS:  Constitutional: No fever, chills, or weakness.   Skin: No rash or diaphoresis.  HENT: + neck pain. No headaches or congestion.  Eyes: No vision changes or photophobia.  Cardio: No chest pain, palpitations, or leg swelling.  Respiratory: No cough, wheezing, or SOB.  GI: + nausea and vomiting. No abdominal pain or stool changes.  GU:  No dysuria, hematuria, or increased frequency.  MSK: No muscle aches, joint pain, or back pain.  Neuro: + arm tingling. No seizures, LOC, numbness, or focal weakness.  Psychiatric: No depression, SI, or substance abuse.    All other systems reviewed and are negative.    Below pertinent information reviewed with patient:  Past Medical History:   Diagnosis Date   . Arthralgia of hip 03/21/2018   . Arthralgia of right upper arm 03/30/2016   . Asthma    . Atherosclerosis of both carotid arteries 03/04/2020   . Cervicalgia 03/21/2018   . Chronic abdominal pain 10/04/2018   . Chronic back pain 03/21/2018   . Chronic chest pain 03/21/2018   . Chronic insomnia    . Constipation in male 09/13/2017   . Diabetes mellitus, type 2 (CMS HCC)    . Esophageal reflux    . 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 Hepatic lesion 10/04/2018   . Hyperlipidemia    . Hypertension    . Impingement syndrome of left shoulder 12/28/2017   . Mild persistent asthma without complication 23/53/6144   . NAFL (nonalcoholic fatty liver)    . Normal cardiac stress test 03/21/2018   . Nosebleed    . Obesity    . 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 Partial tear of common extensor tendon of elbow 12/13/2017   . Pituitary tumor    . Pre-diabetes    . Rib pain 10/04/2018   . Sexual dysfunction    . Tear of talofibular ligament 05/30/2013    RIGHT   . Vitamin D deficiency      Medications Prior to Admission     Prescriptions    albuterol sulfate (PROVENTIL OR VENTOLIN OR PROAIR) 90 mcg/actuation Inhalation HFA Aerosol Inhaler    inhale 1 puff by mouth every 4 hours if needed wheezing    amLODIPine (NORVASC) 10 mg Oral Tablet    take 1 tablet by mouth once daily    aspirin (ECOTRIN) 81 mg Oral Tablet, Delayed Release (E.C.)    Take 1 Tablet (81 mg total) by mouth Once a day    atorvastatin (LIPITOR) 80 mg Oral Tablet    take  1 tablet by mouth at bedtime    cholecalciferol, vitamin D3, 125 mcg (5,000 unit) Oral Capsule    take 1 capsule by mouth once daily    cyclobenzaprine (FLEXERIL) 10 mg Oral Tablet    Take 1 Tablet (10 mg total) by mouth Every night as needed for Muscle spasms Indications: muscle spasm    diclofenac sodium (VOLTAREN) 1 % Gel    by Apply Topically route Four times a day - before meals and bedtime    folic acid (FOLVITE) 1 mg Oral Tablet    take 1 tablet by mouth once daily    gabapentin (NEURONTIN) 600 mg Oral Tablet    take 1 tablet by mouth three times a day    Ibuprofen (MOTRIN) 800 mg Oral Tablet    take 1 tablet by mouth three times a day if needed for pain    losartan (COZAAR) 100 mg Oral Tablet    take 1 tablet by mouth once daily    lubiprostone (AMITIZA) 24 mcg Oral Capsule    Take 1 Cap (24 mcg total) by mouth Twice daily    magnesium oxide (MAG-OX) 400 mg (241.3 mg magnesium) Oral Tablet     Take 400 mg by mouth    metFORMIN (GLUCOPHAGE) 500 mg Oral Tablet    Take 1 Tablet (500 mg total) by mouth Every morning with breakfast Indications: prevention of type 2 diabetes mellitus    mupirocin (BACTROBAN) 2 % Ointment    by Apply Topically route Three times a day    omega-3 fatty acid (LOVAZA) 1 gram Oral Capsule    take 2 capsules by mouth twice a day    omeprazole (PRILOSEC) 40 mg Oral Capsule, Delayed Release(E.C.)    take 1 capsule by mouth once daily    oxyCODONE-acetaminophen (PERCOCET) 10-325 mg Oral Tablet    take 1 tablet by mouth three times a day if needed for pain    PARoxetine (PAXIL) 20 mg Oral Tablet    take 1 tablet by mouth once daily    Sildenafil (VIAGRA) 25 mg Oral Tablet    take 1 tablet by mouth every 24 hours if needed    SPIRIVA RESPIMAT 1.25 mcg/actuation Inhalation Mist    inhale 1 puff by mouth and INTO THE LUNGS once daily    SYMBICORT 160-4.5 mcg/actuation Inhalation HFA Aerosol Inhaler    inhale 2 puffs by mouth twice a day Rinse mouth after use    triamterene-hydroCHLOROthiazide (MAXZIDE-25) 37.5-25 mg Oral Tablet    Take 1 Tablet by mouth Every morning Indications: high blood pressure    zafirlukast (ACCOLATE) 20 mg Oral Tablet    take 1 tablet by mouth twice a day before meals    zolpidem (AMBIEN) 10 mg Oral Tablet    take 1 tablet by mouth at bedtime if needed        No Known Allergies    Past Surgical History:   Procedure Laterality Date   . COLONOSCOPY     . ELBOW SURGERY Left 12/2017   . HX OTHER  2004     SPINAL FUSION,ANT,EA ADNL LEVEL c3-c7   . HX OTHER Right     right great toe   . HX SHOULDER SURGERY Bilateral    . NOSE SURGERY       Family History   Problem Relation Age of Onset   . Cancer Mother    . Cancer Sister      Social History     Tobacco Use   .  Smoking status: Never   . Smokeless tobacco: Never   Substance Use Topics   . Alcohol use: Not Currently     Objective:    ED Triage Vitals   BP (Non-Invasive) 03/11/21 2014 (!) 150/105   Heart Rate 03/11/21 2014 87    Respiratory Rate 03/11/21 2014 16   Temperature 03/11/21 2014 35.7 C (96.3 F)   SpO2 03/11/21 2014 93 %   Weight 03/11/21 2011 108 kg (238 lb 8.6 oz)   Height 03/11/21 2011 1.753 m (5\' 9" )     Nursing notes and vital signs reviewed.    Constitutional - well-developed well-nourished male sitting in bed.  No acute distress.  Pleasant.  HEENT - Normocephalic. Atraumatic. PERRLA. EOMI. Conjunctiva clear. Oropharynx with no erythema, lesions, or exudates. Moist mucous membranes. No sinus tenderness to palpation.  Neck - Trachea midline. No stridor. No hoarseness.  No midline or paraspinal tenderness to palpation.  Cardiac - Regular rate and rhythm. No murmurs, rubs, or gallops appreciated. No edema. Radial pulse palpated.  Respiratory - Good air movement. Clear to auscultation bilaterally. No rales or rhonchi.  Abdomen - Non-tender. No rebound or guarding. Normoactive bowel sounds auscultated.  Musculoskeletal - Good AROM. No muscle or joint tenderness appreciated.   Skin - Warm and dry.  No acute concerning lesions.  Neuro - Moves eyes in all directions. No dysarthria. No facial numbness or asymmetry. No tactile deficits to fine touch. Moves all extremities without difficulty.  Psych - Alert and oriented. Answers questions appropriately.     Labs:   No results found for any visits on 03/11/21.    Imaging:   CT BRAIN WO IV CONTRAST   Final Result by Edi, Radresults In (10/26 2140)      No acute intracranial findings.   No cervical spine fracture or traumatic subluxation.     .         Signed by Myrene Buddy, MD      CT CERVICAL SPINE WO IV CONTRAST   Final Result by Edi, Radresults In (10/26 2139)      No acute intracranial findings.   No cervical spine fracture or traumatic subluxation.     .         Signed by Myrene Buddy, MD        MDM/Course:  Patient seen and examined.  Kaiyon Hynes is 60 y.o. male who presented with neck pain.    Orders Placed This Encounter   . CT BRAIN WO IV CONTRAST   . CT CERVICAL  SPINE WO IV CONTRAST   . FOLLOW-UP: [place], [place]     ED Course as of 03/11/21 2228   Wed Mar 11, 2021   2140 CT BRAIN WO IV CONTRAST   2140 CT CERVICAL SPINE WO IV CONTRAST      Patient evaluated upon arrival to the Emergency Department.  Diagnostic evaluation reviewed.  CT imaging consistent with prior surgical interventions.  Patient does have chronic changes to bilateral cervical spine.     He required no medications during his emergency department stay.     On re-evaluation, discussed results of workup.  Plan for outpatient follow-up with orthopedics.  He understands current plan and is in agreement this time.    Disposition: Discharged    Clinical Impression:     Encounter Diagnoses   Name Primary?   . Bilateral arm numbness and tingling while sleeping Yes   . Headache  Follow up:    Wynonia Hazard, MD  [place] [address]  [phone]    Call in 1 day      [facility]  [place] [address]  [phone]    If symptoms worsen    Orthopedics, [facility]  [place] [address]  [phone]      Future Appointments scheduled in [place]:   Future Appointments   Date Time Provider [place]   03/18/2021 11:30 AM [facility] [place] [facility]   04/01/2021  3:15 PM Wynonia Hazard, MD [place]     Discharge Medications:  None    Parts of this patients chart were completed in a retrospective fashion due to simultaneous direct patient care activities in the Emergency Department. Portions of this note may have been dictated using voice recognition software.     Pamalee Leyden, MD   03/11/2021, 22:28  Assistant Professor  [facility] Department of Emergency Medicine

## 2021-03-11 NOTE — Discharge Instructions (Signed)
Thank you for letting us care for you today. Please follow up with your PCP and with a spine surgeon and return to the ED for high fevers, severe pain, nausea/vomiting not controlled at home, or other concerning symptoms.

## 2021-03-11 NOTE — Telephone Encounter (Signed)
Pt calling in. States that he's having tingling and numbness in right cheek, vomiting x 4 days, cannot hold food or fluid down, and severe headache. States that he feels like "a steel beam is hitting" him in the right side of the head. Instructed pt to go to the ER for treatment.

## 2021-03-11 NOTE — ED Triage Notes (Signed)
Had vomiting for about 4-5 days; both sides of neck tingling for about 2 months; Cheeks both get numb and really hard for the last 2 months; Headaches that are severe for 2 months as well.

## 2021-03-12 ENCOUNTER — Ambulatory Visit (INDEPENDENT_AMBULATORY_CARE_PROVIDER_SITE_OTHER): Payer: Self-pay | Admitting: Family Medicine

## 2021-03-12 ENCOUNTER — Telehealth (INDEPENDENT_AMBULATORY_CARE_PROVIDER_SITE_OTHER): Payer: Self-pay | Admitting: NURSE PRACTITIONER

## 2021-03-12 NOTE — Telephone Encounter (Signed)
Pt calling in. States that he went to ER yesterday and "they didn't do anything for me." Continues to state he is having a severe headache, vomiting, and numbness/tingling in right cheek. Requesting appt. Appt scheduled.

## 2021-03-12 NOTE — Telephone Encounter (Signed)
RTC to pt. Left vm to call office.

## 2021-03-12 NOTE — Telephone Encounter (Signed)
Regarding: [place] Pt // Call back requested  ----- Message from [place] sent at 03/12/2021 12:58 PM EDT -----  Wynonia Hazard, MD    Pt calling in requesting a call back from the nurses as soon as possible, but refused to state a reason why other than "tell them they are the ones that sent me to the hospital last night!", he then disconnected the call.    Thanks,  [employer]

## 2021-03-12 NOTE — Telephone Encounter (Signed)
Patient is on the schedule tomorrow for a hospital follow-up, was seen in the emergency department yesterday.  Needs to be a half our visit.  Thank you.

## 2021-03-13 ENCOUNTER — Other Ambulatory Visit: Payer: Self-pay

## 2021-03-13 ENCOUNTER — Ambulatory Visit (INDEPENDENT_AMBULATORY_CARE_PROVIDER_SITE_OTHER): Payer: Medicare (Managed Care) | Admitting: NURSE PRACTITIONER

## 2021-03-13 VITALS — BP 122/90 | HR 63 | Temp 96.7°F | Ht 69.0 in | Wt 236.0 lb

## 2021-03-13 DIAGNOSIS — Z6834 Body mass index (BMI) 34.0-34.9, adult: Secondary | ICD-10-CM

## 2021-03-13 DIAGNOSIS — Z09 Encounter for follow-up examination after completed treatment for conditions other than malignant neoplasm: Secondary | ICD-10-CM

## 2021-03-13 DIAGNOSIS — R202 Paresthesia of skin: Secondary | ICD-10-CM

## 2021-03-13 DIAGNOSIS — R112 Nausea with vomiting, unspecified: Secondary | ICD-10-CM

## 2021-03-13 DIAGNOSIS — R200 Anesthesia of skin: Secondary | ICD-10-CM

## 2021-03-13 DIAGNOSIS — R519 Headache, unspecified: Secondary | ICD-10-CM

## 2021-03-13 DIAGNOSIS — M542 Cervicalgia: Secondary | ICD-10-CM

## 2021-03-13 MED ORDER — ONDANSETRON 4 MG DISINTEGRATING TABLET
4.0000 mg | ORAL_TABLET | Freq: Three times a day (TID) | ORAL | 0 refills | Status: AC | PRN
Start: 2021-03-13 — End: 2021-03-20

## 2021-03-13 NOTE — Nursing Note (Signed)
Pt is here for hospital follow up. Pt has no other concerns at this time.

## 2021-03-13 NOTE — Progress Notes (Signed)
[facility], [facility]  [place] [address]  Phone: [phone]  Fax: [phone]    Encounter Date: 03/13/2021    Patient ID:  Steve Young  TML:[id number]    DOB: 1961/01/09  Age: 60 y.o. male    Subjective:     Chief Complaint   Patient presents with   . Hospital Follow Up     The patient presents today for Emergency Department follow-up visit.  He was seen in the emergency department at the [facility] on March 11, 2021.  His chief complaint upon presentation was headache and bilateral arm numbness and tingling.  He does have a history of spinal fusions.  While in the emergency department he had the following workup:    Study Result    Narrative & Impression  INDICATION:  Neck pain.  Headache.  Facial numbness.  Tingling.    TECHNIQUE:  CT of the brain was performed without contrast.  CT of the cervical spine was performed without intravenous or intrathecal contrast.  Sagittal and coronal reconstructions were generated.      Automated mA/kV exposure control was utilized and patient examination was performed in strict accordance with principles of ALARA.    RADIATION AMOUNT:  1705.4 mGy-cm.    COMPARISON:  CT Brain and C-Spine 08/18/2020.    FINDINGS:   CT head:  There is no acute intracranial hemorrhage, midline shift, mass effect or extra-axial fluid collection.  Gray-white differentiation is preserved.       Brain volume and ventricular system are within normal limits for age.  Mild patchy areas of low-attenuation are seen in the subcortical and deep periventricular white matter suggesting areas of chronic microvascular ischemia.      The skull base and calvarium are intact.  Bilateral TMJ osteoarthritis.  The visualized paranasal sinuses are unremarkable.  The visualized orbits, globes, and mastoid air cells are unremarkable.      CT cervical spine:  The alignment is anatomic.  Postsurgical changes from ACDF from C3 to C5.Marland Kitchen  Chronic fusion  of the C5-C6 and C6-C7 discs again noted.  There is no fracture or traumatic subluxation.      The vertebral body heights are well maintained.  Sequela from multilevel degenerative disc disease is noted. These findings include disc space narrowing, endplate sclerosis, and peripheral osteophyte formation. Facet arthropathy is noted. These processes contribute to foraminal narrowing.  There is no high-grade bony central canal stenosis within limitations of CT examination.     The paravertebral soft tissues are within normal limits.  The visualized upper chest is unremarkable.    IMPRESSION:    No acute intracranial findings.  No cervical spine fracture or traumatic subluxation.      He was discharged home to follow-up with primary care as well as Orthopedics.  He has an appointment scheduled with Ortho Spine on March 16, 2021.  His next follow-up appointment here is on April 01, 2021.  Today, he tells me his symptoms have remained the same.  Today, he tells me he is having some nausea and has vomited several times.  He denies fever, chills, or abdominal pain.  He denies respiratory symptoms.  He tells me he is eating and drinking per his usual.        Current Outpatient Medications   Medication Sig   . albuterol sulfate (PROVENTIL OR VENTOLIN OR PROAIR) 90 mcg/actuation Inhalation HFA Aerosol Inhaler inhale 1 puff by mouth every 4 hours if needed  wheezing   . amLODIPine (NORVASC) 10 mg Oral Tablet take 1 tablet by mouth once daily   . aspirin (ECOTRIN) 81 mg Oral Tablet, Delayed Release (E.C.) Take 1 Tablet (81 mg total) by mouth Once a day   . atorvastatin (LIPITOR) 80 mg Oral Tablet take 1 tablet by mouth at bedtime   . cholecalciferol, vitamin D3, 125 mcg (5,000 unit) Oral Capsule take 1 capsule by mouth once daily   . cyclobenzaprine (FLEXERIL) 10 mg Oral Tablet Take 1 Tablet (10 mg total) by mouth Every night as needed for Muscle spasms Indications: muscle spasm   . diclofenac sodium (VOLTAREN) 1 % Gel  by Apply Topically route Four times a day - before meals and bedtime   . folic acid (FOLVITE) 1 mg Oral Tablet take 1 tablet by mouth once daily   . gabapentin (NEURONTIN) 600 mg Oral Tablet take 1 tablet by mouth three times a day   . Ibuprofen (MOTRIN) 800 mg Oral Tablet take 1 tablet by mouth three times a day if needed for pain   . losartan (COZAAR) 100 mg Oral Tablet take 1 tablet by mouth once daily   . lubiprostone (AMITIZA) 24 mcg Oral Capsule Take 1 Cap (24 mcg total) by mouth Twice daily   . magnesium oxide (MAG-OX) 400 mg (241.3 mg magnesium) Oral Tablet Take 400 mg by mouth   . metFORMIN (GLUCOPHAGE) 500 mg Oral Tablet Take 1 Tablet (500 mg total) by mouth Every morning with breakfast Indications: prevention of type 2 diabetes mellitus   . mupirocin (BACTROBAN) 2 % Ointment by Apply Topically route Three times a day   . omega-3 fatty acid (LOVAZA) 1 gram Oral Capsule take 2 capsules by mouth twice a day   . omeprazole (PRILOSEC) 40 mg Oral Capsule, Delayed Release(E.C.) take 1 capsule by mouth once daily   . ondansetron (ZOFRAN ODT) 4 mg Oral Tablet, Rapid Dissolve Take 1 Tablet (4 mg total) by mouth Every 8 hours as needed for Nausea/Vomiting for up to 7 days   . oxyCODONE-acetaminophen (PERCOCET) 10-325 mg Oral Tablet take 1 tablet by mouth three times a day if needed for pain   . PARoxetine (PAXIL) 20 mg Oral Tablet take 1 tablet by mouth once daily   . Sildenafil (VIAGRA) 25 mg Oral Tablet take 1 tablet by mouth every 24 hours if needed   . SPIRIVA RESPIMAT 1.25 mcg/actuation Inhalation Mist inhale 1 puff by mouth and INTO THE LUNGS once daily   . SYMBICORT 160-4.5 mcg/actuation Inhalation HFA Aerosol Inhaler inhale 2 puffs by mouth twice a day Rinse mouth after use   . triamterene-hydroCHLOROthiazide (MAXZIDE-25) 37.5-25 mg Oral Tablet Take 1 Tablet by mouth Every morning Indications: high blood pressure   . zafirlukast (ACCOLATE) 20 mg Oral Tablet take 1 tablet by mouth twice a day before meals   .  zolpidem (AMBIEN) 10 mg Oral Tablet take 1 tablet by mouth at bedtime if needed     No Known Allergies  Past Medical History:   Diagnosis Date   . Arthralgia of hip 03/21/2018   . Arthralgia of right upper arm 03/30/2016   . Asthma    . Atherosclerosis of both carotid arteries 03/04/2020   . Cervicalgia 03/21/2018   . Chronic abdominal pain 10/04/2018   . Chronic back pain 03/21/2018   . Chronic chest pain 03/21/2018   . Chronic insomnia    . Constipation in male 09/13/2017   . Diabetes mellitus, type 2 (CMS HCC)    .  Esophageal reflux    . 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 Hepatic lesion 10/04/2018   . Hyperlipidemia    . Hypertension    . Impingement syndrome of left shoulder 12/28/2017   . Mild persistent asthma without complication 78/58/8502   . NAFL (nonalcoholic fatty liver)    . Normal cardiac stress test 03/21/2018   . Nosebleed    . Obesity    . 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 Partial tear of common extensor tendon of elbow 12/13/2017   . Pituitary tumor    . Pre-diabetes    . Rib pain 10/04/2018   . Sexual dysfunction    . Tear of talofibular ligament 05/30/2013    RIGHT   . Vitamin D deficiency          Past Surgical History:   Procedure Laterality Date   . COLONOSCOPY     . ELBOW SURGERY Left 12/2017   . HX OTHER  2004     SPINAL FUSION,ANT,EA ADNL LEVEL c3-c7   . HX OTHER Right     right great toe   . HX SHOULDER SURGERY Bilateral    . NOSE SURGERY           Family Medical History:     Problem Relation (Age of Onset)    Cancer Mother, Sister          Social History     Tobacco Use   . Smoking status: Never   . Smokeless tobacco: Never   Vaping Use   . Vaping Use: Never used   Substance Use Topics   . Alcohol use: Not Currently   . Drug use: Never       Review of Systems   Constitutional: Negative.    HENT: Negative.    Eyes: Negative.    Respiratory: Negative.    Cardiovascular: Negative.    Gastrointestinal: Positive for nausea and vomiting.   Endocrine: Negative.    Genitourinary:  Negative.    Musculoskeletal: Positive for neck pain.   Skin: Negative.    Neurological: Positive for numbness and headaches.   Hematological: Negative.    Psychiatric/Behavioral: Negative.      Objective:   Vitals: BP (!) 122/90 (Site: Left, Patient Position: Sitting, Cuff Size: Adult Large)   Pulse 63   Temp 35.9 C (96.7 F) (Thermal Scan)   Ht 1.753 m (5\' 9" )   Wt 107 kg (236 lb)   SpO2 97%   BMI 34.85 kg/m         Physical Exam  HENT:      Head: Normocephalic.      Right Ear: Tympanic membrane normal.      Left Ear: Tympanic membrane normal.      Nose: Nose normal.      Mouth/Throat:      Mouth: Mucous membranes are moist.   Eyes:      Extraocular Movements: Extraocular movements intact.      Pupils: Pupils are equal, round, and reactive to light.   Cardiovascular:      Rate and Rhythm: Normal rate and regular rhythm.      Pulses: Normal pulses.      Heart sounds: Normal heart sounds.   Pulmonary:      Effort: Pulmonary effort is normal.      Breath sounds: Normal breath sounds.   Abdominal:      Palpations: Abdomen is soft.      Tenderness: There is no abdominal tenderness.   Musculoskeletal:  General: Normal range of motion.      Cervical back: Normal range of motion and neck supple.   Skin:     General: Skin is warm and dry.      Capillary Refill: Capillary refill takes less than 2 seconds.   Neurological:      General: No focal deficit present.      Mental Status: He is alert and oriented to person, place, and time.   Psychiatric:         Mood and Affect: Mood normal.         Behavior: Behavior normal.         Assessment & Plan:     On the day of the encounter, a total of  46 minutes was spent on this patient encounter including review of historical information, emergency department records, imaging, labs, examination, documentation and post-visit activities.       ICD-10-CM    1. Headache  R51.9       2. Neck pain  M54.2       3. Bilateral arm numbness and tingling while sleeping  R20.0      R20.2       4. Nausea & vomiting  E15.8 BASIC METABOLIC PANEL, FASTING      5. Encounter for examination following treatment at hospital  Z09       6. BMI 34.0-34.9,adult  Z68.34         Orders Placed This Encounter   . BASIC METABOLIC PANEL, FASTING   . ondansetron (ZOFRAN ODT) 4 mg Oral Tablet, Rapid [facility]     Emergency department records and imaging reviewed.  The patient has appointment with Ortho Spine scheduled on Monday.  He was advised to keep that appointment as scheduled.  He is having some nausea and vomiting, I sent a course of Zofran to the pharmacy to help combat nausea and vomiting.  Additionally, his potassium was minimally low at 3.4 on March 09, 2021.  He was advised to have repeat BMP completed as ordered.  Discussed brat diet with patient as well as consuming electrolyte drinks such as Pedialyte.  Advised patient should symptoms worsen all report to the emergency department and he verbalized understanding.    [employer], CRNP

## 2021-03-16 ENCOUNTER — Ambulatory Visit (HOSPITAL_COMMUNITY): Payer: Self-pay | Admitting: Orthopaedic Surgery of the Spine

## 2021-03-17 ENCOUNTER — Ambulatory Visit: Payer: Medicare (Managed Care) | Attending: Emergency Medicine | Admitting: Orthopaedic Surgery of the Spine

## 2021-03-17 ENCOUNTER — Other Ambulatory Visit: Payer: Self-pay

## 2021-03-17 ENCOUNTER — Encounter (HOSPITAL_COMMUNITY): Payer: Self-pay | Admitting: Orthopaedic Surgery of the Spine

## 2021-03-17 ENCOUNTER — Inpatient Hospital Stay (HOSPITAL_BASED_OUTPATIENT_CLINIC_OR_DEPARTMENT_OTHER)
Admission: RE | Admit: 2021-03-17 | Discharge: 2021-03-17 | Disposition: A | Discharge status: Home / Self Care | Payer: Medicare (Managed Care) | Source: Ambulatory Visit | Admitting: Radiology

## 2021-03-17 VITALS — BP 136/92 | HR 87 | Temp 95.9°F | Ht 69.0 in | Wt 232.2 lb

## 2021-03-17 DIAGNOSIS — M542 Cervicalgia: Secondary | ICD-10-CM | POA: Insufficient documentation

## 2021-03-17 DIAGNOSIS — R200 Anesthesia of skin: Secondary | ICD-10-CM | POA: Insufficient documentation
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
## 2021-03-18 ENCOUNTER — Ambulatory Visit (HOSPITAL_COMMUNITY): Payer: Self-pay | Admitting: Ultrasound

## 2021-03-19 ENCOUNTER — Other Ambulatory Visit (INDEPENDENT_AMBULATORY_CARE_PROVIDER_SITE_OTHER): Payer: Self-pay | Admitting: Family Medicine

## 2021-03-19 DIAGNOSIS — F4310 Post-traumatic stress disorder, unspecified: Secondary | ICD-10-CM

## 2021-03-19 DIAGNOSIS — Z981 Arthrodesis status: Secondary | ICD-10-CM

## 2021-03-19 DIAGNOSIS — F5104 Psychophysiologic insomnia: Secondary | ICD-10-CM

## 2021-03-20 ENCOUNTER — Other Ambulatory Visit (INDEPENDENT_AMBULATORY_CARE_PROVIDER_SITE_OTHER): Payer: Self-pay | Admitting: Family Medicine

## 2021-03-20 DIAGNOSIS — F5104 Psychophysiologic insomnia: Secondary | ICD-10-CM

## 2021-03-20 DIAGNOSIS — F4310 Post-traumatic stress disorder, unspecified: Secondary | ICD-10-CM

## 2021-03-20 NOTE — Telephone Encounter (Signed)
Last Visit:03/13/2021     Upcoming appointments: 04/01/2021     Pended for review      Meribeth Mattes, RN  03/20/2021, 15:49

## 2021-03-20 NOTE — Telephone Encounter (Signed)
Last Visit: 03/13/21     Upcoming appointments: 04/01/21        Steve Young, [place]  03/20/2021, 08:52

## 2021-03-30 ENCOUNTER — Other Ambulatory Visit (INDEPENDENT_AMBULATORY_CARE_PROVIDER_SITE_OTHER): Payer: Self-pay | Admitting: Internal Medicine

## 2021-03-31 NOTE — Telephone Encounter (Signed)
Last Visit:02/18/2021    Upcoming appointments: 04/01/2021           Andree Coss, [place]  03/31/2021, 12:47

## 2021-04-01 ENCOUNTER — Other Ambulatory Visit: Payer: Self-pay

## 2021-04-01 ENCOUNTER — Encounter (INDEPENDENT_AMBULATORY_CARE_PROVIDER_SITE_OTHER): Payer: Self-pay | Admitting: Family Medicine

## 2021-04-01 ENCOUNTER — Ambulatory Visit (INDEPENDENT_AMBULATORY_CARE_PROVIDER_SITE_OTHER): Payer: Medicare (Managed Care) | Admitting: Family Medicine

## 2021-04-01 VITALS — BP 124/70 | HR 77 | Temp 96.8°F | Ht 69.0 in | Wt 231.0 lb

## 2021-04-01 DIAGNOSIS — I10 Essential (primary) hypertension: Secondary | ICD-10-CM

## 2021-04-01 DIAGNOSIS — E876 Hypokalemia: Secondary | ICD-10-CM

## 2021-04-01 DIAGNOSIS — F5104 Psychophysiologic insomnia: Secondary | ICD-10-CM

## 2021-04-01 DIAGNOSIS — K760 Fatty (change of) liver, not elsewhere classified: Secondary | ICD-10-CM

## 2021-04-01 DIAGNOSIS — J4540 Moderate persistent asthma, uncomplicated: Secondary | ICD-10-CM

## 2021-04-01 DIAGNOSIS — R42 Dizziness and giddiness: Secondary | ICD-10-CM | POA: Insufficient documentation

## 2021-04-01 DIAGNOSIS — F4310 Post-traumatic stress disorder, unspecified: Secondary | ICD-10-CM

## 2021-04-01 DIAGNOSIS — D649 Anemia, unspecified: Secondary | ICD-10-CM

## 2021-04-01 DIAGNOSIS — E782 Mixed hyperlipidemia: Secondary | ICD-10-CM

## 2021-04-01 MED ORDER — MECLIZINE 25 MG TABLET
25.0000 mg | ORAL_TABLET | Freq: Three times a day (TID) | ORAL | 3 refills | Status: DC | PRN
Start: 2021-04-01 — End: 2022-09-06

## 2021-04-01 NOTE — Nursing Note (Signed)
Patient is here for a follow up. Patient is complaining of right ear pain and dizziness. Patient stating he started vomiting again.

## 2021-04-01 NOTE — Progress Notes (Signed)
Chief Complaint   Patient presents with    Follow Up     Hypertension, PTSD, hypertension, hyperlipidemia, asthma, fatty liver, insomnia    Dizziness     Last office visit with the provider:02/18/2021     SUBJECTIVE:  Steve Young is a 60 y.o. male,White. Patient is having right ear pain and dizziness.  He states he started vomiting again.  His blood pressure has improved.  He has lost few lb. He has no significant headache. He does not have the following symptoms today: fever, chills, cough, nasal congestion, sore throat, abdominal pain, tremors, diaphoresis, eye pain or irritation, epistaxis, chest discomfort, stroke symptoms, shortness of breath, dyspnea on exertion or palpitations. Side effects of medications: no current medication side effects.     Current Outpatient Medications   Medication Sig    albuterol sulfate (PROVENTIL OR VENTOLIN OR PROAIR) 90 mcg/actuation Inhalation HFA Aerosol Inhaler inhale 1 puff by mouth every 4 hours if needed wheezing    amLODIPine (NORVASC) 10 mg Oral Tablet take 1 tablet by mouth once daily    aspirin (ECOTRIN) 81 mg Oral Tablet, Delayed Release (E.C.) Take 1 Tablet (81 mg total) by mouth Once a day    atorvastatin (LIPITOR) 80 mg Oral Tablet take 1 tablet by mouth at bedtime    cholecalciferol, vitamin D3, 125 mcg (5,000 unit) Oral Capsule take 1 capsule by mouth once daily    cyclobenzaprine (FLEXERIL) 10 mg Oral Tablet Take 1 Tablet (10 mg total) by mouth Every night as needed for Muscle spasms Indications: muscle spasm    diclofenac sodium (VOLTAREN) 1 % Gel by Apply Topically route Four times a day - before meals and bedtime    folic acid (FOLVITE) 1 mg Oral Tablet take 1 tablet by mouth once daily    gabapentin (NEURONTIN) 600 mg Oral Tablet take 1 tablet by mouth three times a day    Ibuprofen (MOTRIN) 800 mg Oral Tablet take 1 tablet by mouth three times a day if needed for pain    losartan (COZAAR) 100 mg Oral Tablet take 1 tablet by mouth once  daily    lubiprostone (AMITIZA) 24 mcg Oral Capsule Take 1 Cap (24 mcg total) by mouth Twice daily    magnesium oxide (MAG-OX) 400 mg (241.3 mg magnesium) Oral Tablet Take 400 mg by mouth    metFORMIN (GLUCOPHAGE) 500 mg Oral Tablet Take 1 Tablet (500 mg total) by mouth Every morning with breakfast Indications: prevention of type 2 diabetes mellitus    mupirocin (BACTROBAN) 2 % Ointment by Apply Topically route Three times a day    omega-3 fatty acid (LOVAZA) 1 gram Oral Capsule take 2 capsules by mouth twice a day    omeprazole (PRILOSEC) 40 mg Oral Capsule, Delayed Release(E.C.) take 1 capsule by mouth once daily    oxyCODONE-acetaminophen (PERCOCET) 10-325 mg Oral Tablet take 1 tablet by mouth three times a day if needed for pain    PARoxetine (PAXIL) 20 mg Oral Tablet take 1 tablet by mouth once daily    Sildenafil (VIAGRA) 25 mg Oral Tablet take 1 tablet by mouth every 24 hours if needed    SPIRIVA RESPIMAT 1.25 mcg/actuation Inhalation Mist inhale 1 puff by mouth and INTO THE LUNGS once daily    SYMBICORT 160-4.5 mcg/actuation Inhalation HFA Aerosol Inhaler inhale 2 puffs by mouth twice a day Rinse mouth after use    triamterene-hydroCHLOROthiazide (MAXZIDE-25) 37.5-25 mg Oral Tablet Take 1 Tablet by mouth Every morning Indications: high blood  pressure    zafirlukast (ACCOLATE) 20 mg Oral Tablet take 1 tablet by mouth twice a day before meals    zolpidem (AMBIEN) 10 mg Oral Tablet take 1 tablet by mouth at bedtime if needed      Past Medical History:   Diagnosis Date    Arthralgia of hip 03/21/2018    Arthralgia of right upper arm 03/30/2016    Asthma     Atherosclerosis of both carotid arteries 03/04/2020    Cervicalgia 03/21/2018    Chronic abdominal pain 10/04/2018    Chronic back pain 03/21/2018    Chronic chest pain 03/21/2018    Chronic insomnia     Constipation in male 09/13/2017    Diabetes mellitus, type 2 (CMS HCC)     Esophageal reflux     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     Hepatic lesion 10/04/2018    Hyperlipidemia     Hypertension     Impingement syndrome of left shoulder 12/28/2017    Mild persistent asthma without complication 92/03/9416    NAFL (nonalcoholic fatty liver)     Normal cardiac stress test 03/21/2018    Nosebleed     Obesity     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    Partial tear of common extensor tendon of elbow 12/13/2017    Pituitary tumor     Pre-diabetes     Rib pain 10/04/2018    Sexual dysfunction     Tear of talofibular ligament 05/30/2013    RIGHT    Vitamin D deficiency      No Known Allergies    Past Surgical History:   Procedure Laterality Date    COLONOSCOPY      ELBOW SURGERY Left 12/2017    HX OTHER  2004     SPINAL FUSION,ANT,EA ADNL LEVEL c3-c7    HX OTHER Right     right great toe    HX SHOULDER SURGERY Bilateral     NOSE SURGERY       Social History     Tobacco Use    Smoking status: Never    Smokeless tobacco: Never   Vaping Use    Vaping Use: Never used   Substance Use Topics    Alcohol use: Not Currently    Drug use: Never     OBJECTIVE:   The patient appears to be in no acute distress.  Vitals: BP 124/70 (Site: Left, Patient Position: Sitting, Cuff Size: Adult Large)    Pulse 77    Temp 36 C (96.8 F) (Thermal Scan)    Ht 1.753 m (5\' 9" )    Wt 105 kg (231 lb)    SpO2 96%    BMI 34.11 kg/m   General: alert, no acute distress  Head: normocephalic, atraumatic  Eye: EOM intact, normal conjunctiva  Ears:  No pain or cerumen impaction  Respiratory: Good diaphragmatic excursion. Lungs clear.  Heart: RRR  Extremities: Ambulatory with normal gait, no edema  Neurologic: Awake, alert, oriented, speech clear and coherent  Skin: No jaundice, warm   Psychiatric: Cooperative, normal judgment    ASSESSMENT AND PLAN:     ICD-10-CM    1. Vertigo  R42 PT EVALUATE AND TREAT     meclizine (ANTIVERT) 25 mg Oral Tablet - new     CBC     ECG 12 LEAD - ([place] LOCATION)  He can try meclizine 3 times a day as needed for dizziness,  nausea or vomiting.  Advised to avoid sudden head turning.  Maintain good hydration.      2. PTSD (post-traumatic stress disorder)  F43.10 Stable on Paxil.      3. Essential hypertension  I10 ECG 12 LEAD - ([place] LOCATION)  Improved on current medications.      4. Mixed hyperlipidemia  E78.2 Stable on atorvastatin.      5. Moderate persistent asthma without complication  S97.02 Stable on current medications. Continue weight loss.      6. NAFL (nonalcoholic fatty liver)  O37.8 Stable. Continue weight loss. He had normal liver enzymes.      7. Psychophysiological insomnia  F51.04 Stable on chronic use of Ambien without suspected abuse of medication.      8. Hypokalemia  E87.6 POTASSIUM  Advised to eat fruits and vegetables regularly as part of healthy diet.      9. Mild anemia  D64.9 CBC  Stable. He has no bleeding problems.        Data reviewed:  PSA total on 03/09/2021 was normal at 1.9, elevated glucose of 106, normal calcium of 8.9, elevated glucose of 106  Carotid artery ultrasound on 02/28/2020 showed mild atherosclerotic plaque formation of the bilateral internal carotid arteries.  Less than 50% stenosis of carotid arteries.  CT brain without IV contrast on 03/11/2021 showed no acute intracranial findings.  No cervical spine fracture or traumatic subluxation.  Lab Results   Component Value Date/Time    HA1C 5.8 03/09/2021 09:06 AM    BUN 14 03/09/2021 09:06 AM    CREATININE 1.12 03/09/2021 09:06 AM    GFR >60 03/09/2021 09:06 AM    GLUCOSEFAST 114 03/05/2019 12:00 AM    SODIUM 138 03/09/2021 09:06 AM    POTASSIUM 3.2 (L) 03/09/2021 09:06 AM    WBC 9.0 03/09/2021 09:06 AM    HGB 13.2 (L) 03/09/2021 09:06 AM    HCT 38.9 03/09/2021 09:06 AM    PLTCNT 225 03/09/2021 09:06 AM    AST 16 03/09/2021 09:06 AM    ALT 20 03/09/2021 09:06 AM    CHOLESTEROL 137 03/09/2021 09:06 AM    HDLCHOL 35 03/09/2021 09:06 AM    LDLCHOL 80 03/09/2021 09:06 AM    TRIG 109 03/09/2021 09:06 AM    TSH 0.562 06/11/2019 02:06 PM     MAGNESIUM 2.2 02/12/2020 12:50 PM     Orders Placed This Encounter    CBC    POTASSIUM    PT EVALUATE AND TREAT    ECG 12 LEAD - ([place] LOCATION)    meclizine (ANTIVERT) 25 mg Oral Tablet     Recheck blood work again this month. Above plan was discussed with the patient and patient verbalized understanding. Patient agreed with above treatment and plan. Questions answered to patient's satisfaction. Risks, benefits, and alternatives to above treatment discussed with patient.   Follow-up in 6 months, sooner should new symptoms or problems arise.   He  was advised to contact the office if with any questions or concerns.    Jarome Lamas. Shona Simpson, MD  Note: This chart was transcribed using voice recognition software and may contain unintended word substitution or minor typographical errors.

## 2021-04-02 ENCOUNTER — Ambulatory Visit (INDEPENDENT_AMBULATORY_CARE_PROVIDER_SITE_OTHER): Payer: Self-pay | Admitting: Family Medicine

## 2021-04-02 NOTE — Telephone Encounter (Signed)
RTC to pt. States that he is having right sided

## 2021-04-02 NOTE — Telephone Encounter (Signed)
Regarding: Rx refill  ----- Message from Ladean Raya sent at 04/02/2021  1:47 PM EST -----  Wynonia Hazard, MD  Pt is requesting a refill on the medication below .       penicillin V potassium (VEETID) 500 mg Oral Tablet (Expired) 40 Tablet 0 01/05/2021 01/15/2021  Sig: Take 1 Tablet (500 mg total) by mouth Four times a day for 10 days    Route: Oral      Preferred Pharmacy     [facility] [phone] [facility], [place]    [address] [place] [address]    Phone: [phone] Fax: [phone]    Hours: Not open 24 hours      Thank You Rollene Fare

## 2021-04-03 ENCOUNTER — Ambulatory Visit (HOSPITAL_COMMUNITY): Payer: Self-pay | Admitting: Student in an Organized Health Care Education/Training Program

## 2021-04-04 ENCOUNTER — Inpatient Hospital Stay
Admission: RE | Admit: 2021-04-04 | Discharge: 2021-04-04 | Disposition: A | Discharge status: Home / Self Care | Payer: Medicare (Managed Care) | Source: Ambulatory Visit | Attending: Family Medicine | Admitting: Family Medicine

## 2021-04-04 ENCOUNTER — Other Ambulatory Visit: Payer: Self-pay

## 2021-04-04 ENCOUNTER — Other Ambulatory Visit (HOSPITAL_COMMUNITY): Payer: Medicare (Managed Care)

## 2021-04-04 ENCOUNTER — Other Ambulatory Visit (INDEPENDENT_AMBULATORY_CARE_PROVIDER_SITE_OTHER): Payer: Self-pay | Admitting: Family Medicine

## 2021-04-04 DIAGNOSIS — R112 Nausea with vomiting, unspecified: Secondary | ICD-10-CM

## 2021-04-04 DIAGNOSIS — R42 Dizziness and giddiness: Secondary | ICD-10-CM

## 2021-04-04 DIAGNOSIS — E876 Hypokalemia: Secondary | ICD-10-CM | POA: Insufficient documentation

## 2021-04-04 DIAGNOSIS — D649 Anemia, unspecified: Secondary | ICD-10-CM | POA: Insufficient documentation

## 2021-04-04 DIAGNOSIS — K760 Fatty (change of) liver, not elsewhere classified: Secondary | ICD-10-CM | POA: Insufficient documentation

## 2021-04-04 LAB — BASIC METABOLIC PANEL, FASTING
ANION GAP: 8 mmol/L (ref 6–15)
BUN: 22 mg/dL — ABNORMAL HIGH (ref 7–21)
CALCIUM: 9.5 mg/dL (ref 8.0–10.6)
CHLORIDE: 99 mmol/L (ref 98–107)
CO2 TOTAL: 31 mmol/L (ref 21–32)
CREATININE: 1.29 mg/dL (ref 0.80–1.60)
ESTIMATED GFR: >60 mL/min/{1.73_m2}
GLUCOSE: 107 mg/dL — ABNORMAL HIGH (ref 70–100)
POTASSIUM: 3.6 mmol/L (ref 3.3–5.1)
SODIUM: 138 mmol/L (ref 136–146)

## 2021-04-04 LAB — CBC
HCT: 41.9 % (ref 38.9–52.0)
HGB: 14.3 g/dL (ref 13.4–17.5)
MCH: 29.6 pg (ref 26.0–32.0)
MCHC: 34.1 g/dL (ref 31.0–35.5)
MCV: 86.7 fL (ref 78.0–100.0)
MPV: 10.6 fL (ref 8.7–12.5)
PLATELETS: 265 10*3/uL (ref 150–400)
RBC: 4.83 10*6/uL (ref 4.50–6.10)
RDW-CV: 13.3 % (ref 11.5–15.5)
WBC: 10.9 10*3/uL (ref 3.7–11.0)

## 2021-04-06 NOTE — Telephone Encounter (Signed)
Last Visit:04/01/2021     Upcoming appointments: 09/29/2021           [name], [place]  04/06/2021, 11:52

## 2021-04-08 ENCOUNTER — Other Ambulatory Visit (INDEPENDENT_AMBULATORY_CARE_PROVIDER_SITE_OTHER): Payer: Self-pay | Admitting: Family Medicine

## 2021-04-08 DIAGNOSIS — F5104 Psychophysiologic insomnia: Secondary | ICD-10-CM

## 2021-04-08 DIAGNOSIS — F4310 Post-traumatic stress disorder, unspecified: Secondary | ICD-10-CM

## 2021-04-08 NOTE — Telephone Encounter (Signed)
Last Visit:04/01/21    Upcoming appointments: 09/29/21          [name], [place]  04/08/2021, 09:52

## 2021-04-13 ENCOUNTER — Other Ambulatory Visit (INDEPENDENT_AMBULATORY_CARE_PROVIDER_SITE_OTHER): Payer: Self-pay | Admitting: Family Medicine

## 2021-04-15 NOTE — Telephone Encounter (Signed)
Last Visit:04/01/2021     Upcoming appointments: 09/29/2021           Derl Barrow, [place]  04/15/2021, 10:32

## 2021-04-17 ENCOUNTER — Other Ambulatory Visit: Payer: Self-pay

## 2021-04-17 ENCOUNTER — Inpatient Hospital Stay
Admission: RE | Admit: 2021-04-17 | Discharge: 2021-04-17 | Disposition: A | Discharge status: Home / Self Care | Payer: Medicare (Managed Care) | Source: Ambulatory Visit | Attending: Orthopaedic Surgery of the Spine | Admitting: Orthopaedic Surgery of the Spine

## 2021-04-17 DIAGNOSIS — R200 Anesthesia of skin: Secondary | ICD-10-CM

## 2021-04-20 DIAGNOSIS — M2578 Osteophyte, vertebrae: Secondary | ICD-10-CM

## 2021-04-20 DIAGNOSIS — M9951 Intervertebral disc stenosis of neural canal of cervical region: Secondary | ICD-10-CM

## 2021-04-20 DIAGNOSIS — M9952 Intervertebral disc stenosis of neural canal of thoracic region: Secondary | ICD-10-CM

## 2021-04-20 DIAGNOSIS — Z981 Arthrodesis status: Secondary | ICD-10-CM

## 2021-04-20 DIAGNOSIS — M47812 Spondylosis without myelopathy or radiculopathy, cervical region: Secondary | ICD-10-CM

## 2021-04-20 DIAGNOSIS — M4802 Spinal stenosis, cervical region: Secondary | ICD-10-CM

## 2021-04-20 DIAGNOSIS — M47813 Spondylosis without myelopathy or radiculopathy, cervicothoracic region: Secondary | ICD-10-CM

## 2021-04-28 ENCOUNTER — Ambulatory Visit (INDEPENDENT_AMBULATORY_CARE_PROVIDER_SITE_OTHER): Payer: Medicare (Managed Care) | Admitting: Family

## 2021-04-28 ENCOUNTER — Other Ambulatory Visit: Payer: Medicare (Managed Care) | Attending: Family

## 2021-04-28 ENCOUNTER — Other Ambulatory Visit: Payer: Self-pay

## 2021-04-28 ENCOUNTER — Other Ambulatory Visit (INDEPENDENT_AMBULATORY_CARE_PROVIDER_SITE_OTHER): Payer: Self-pay | Admitting: Family Medicine

## 2021-04-28 ENCOUNTER — Encounter (INDEPENDENT_AMBULATORY_CARE_PROVIDER_SITE_OTHER): Payer: Self-pay | Admitting: Family

## 2021-04-28 VITALS — BP 120/80 | HR 101 | Temp 96.9°F | Resp 18 | Ht 69.0 in | Wt 228.0 lb

## 2021-04-28 DIAGNOSIS — R52 Pain, unspecified: Secondary | ICD-10-CM | POA: Insufficient documentation

## 2021-04-28 DIAGNOSIS — R6883 Chills (without fever): Secondary | ICD-10-CM

## 2021-04-28 DIAGNOSIS — R059 Cough, unspecified: Secondary | ICD-10-CM | POA: Insufficient documentation

## 2021-04-28 DIAGNOSIS — U071 COVID-19: Secondary | ICD-10-CM | POA: Insufficient documentation

## 2021-04-28 DIAGNOSIS — R7303 Prediabetes: Secondary | ICD-10-CM

## 2021-04-28 HISTORY — DX: COVID-19: U07.1

## 2021-04-28 LAB — RAPID INFLUENZA A/B ANTIGEN
INFLUENZA A ANTIGEN: NEGATIVE
INFLUENZA B ANTIGEN: NEGATIVE

## 2021-04-28 MED ORDER — BENZONATATE 200 MG CAPSULE
200.0000 mg | ORAL_CAPSULE | Freq: Three times a day (TID) | ORAL | 0 refills | Status: DC | PRN
Start: 2021-04-28 — End: 2022-09-06

## 2021-04-28 MED ORDER — ONDANSETRON 4 MG DISINTEGRATING TABLET
4.0000 mg | ORAL_TABLET | Freq: Three times a day (TID) | ORAL | 0 refills | Status: DC | PRN
Start: 2021-04-28 — End: 2023-01-10

## 2021-04-28 MED ORDER — LORATADINE 10 MG TABLET
10.0000 mg | ORAL_TABLET | Freq: Every day | ORAL | 0 refills | Status: DC
Start: 2021-04-28 — End: 2022-09-06

## 2021-04-28 MED ORDER — ALBUTEROL SULFATE HFA 90 MCG/ACTUATION AEROSOL INHALER
1.0000 | INHALATION_SPRAY | Freq: Four times a day (QID) | RESPIRATORY_TRACT | 1 refills | Status: DC | PRN
Start: 2021-04-28 — End: 2021-06-10

## 2021-04-28 NOTE — Nursing Note (Signed)
Pt is here for a cough, congestion, body ache, sore throat.

## 2021-04-28 NOTE — Progress Notes (Signed)
[facility], [place]  [place] [address]  Phone: [phone]  Fax: [phone]    Encounter Date: 04/28/2021    Patient ID:  Steve Young  OVF:[id number]    DOB: Nov 28, 1960  Age: 60 y.o. male    Subjective: acute with cough, chills and sore throat.      Chief Complaint   Patient presents with   . Cough   . Cough,Cold,Sore Throat     HPI     The patient presents today with c.o cough, congestion and body aches that started about 4 days ago.  Reports that he is having nausea and vomiting.  He reports that he is able to drink and keep fluids down. He reports that he is coughing and is sob as well.  He reports that he thinks he has had a fever.  Has been using cough medicine OTC without much relief.    Current Outpatient Medications   Medication Sig   . albuterol sulfate (PROVENTIL OR VENTOLIN OR PROAIR) 90 mcg/actuation Inhalation HFA Aerosol Inhaler inhale 1 puff by mouth every 4 hours if needed wheezing   . amLODIPine (NORVASC) 10 mg Oral Tablet take 1 tablet by mouth once daily   . aspirin (ECOTRIN) 81 mg Oral Tablet, Delayed Release (E.C.) Take 1 Tablet (81 mg total) by mouth Once a day   . atorvastatin (LIPITOR) 80 mg Oral Tablet take 1 tablet by mouth at bedtime   . cholecalciferol, vitamin D3, 125 mcg (5,000 unit) Oral Capsule take 1 capsule by mouth once daily   . cyclobenzaprine (FLEXERIL) 10 mg Oral Tablet Take 1 Tablet (10 mg total) by mouth Every night as needed for Muscle spasms Indications: muscle spasm   . diclofenac sodium (VOLTAREN) 1 % Gel by Apply Topically route Four times a day - before meals and bedtime   . folic acid (FOLVITE) 1 mg Oral Tablet take 1 tablet by mouth once daily   . gabapentin (NEURONTIN) 600 mg Oral Tablet take 1 tablet by mouth three times a day   . Ibuprofen (MOTRIN) 800 mg Oral Tablet take 1 tablet by mouth three times a day if needed for pain   . losartan (COZAAR) 100 mg Oral Tablet take 1 tablet by mouth once daily   .  lubiprostone (AMITIZA) 24 mcg Oral Capsule Take 1 Cap (24 mcg total) by mouth Twice daily   . magnesium oxide (MAG-OX) 400 mg (241.3 mg magnesium) Oral Tablet Take 400 mg by mouth   . meclizine (ANTIVERT) 25 mg Oral Tablet Take 1 Tablet (25 mg total) by mouth Every 8 hours as needed for Dizziness or Nausea/Vomiting Indications: motion sickness, sensation of spinning or whirling   . metFORMIN (GLUCOPHAGE) 500 mg Oral Tablet Take 1 Tablet (500 mg total) by mouth Every morning with breakfast Indications: prevention of type 2 diabetes mellitus   . mupirocin (BACTROBAN) 2 % Ointment by Apply Topically route Three times a day   . omega-3 fatty acid (LOVAZA) 1 gram Oral Capsule take 2 capsules by mouth twice a day   . omeprazole (PRILOSEC) 40 mg Oral Capsule, Delayed Release(E.C.) take 1 capsule by mouth once daily   . oxyCODONE-acetaminophen (PERCOCET) 10-325 mg Oral Tablet take 1 tablet by mouth three times a day if needed for pain   . PARoxetine (PAXIL) 20 mg Oral Tablet take 1 tablet by mouth once daily   . Sildenafil (VIAGRA) 25 mg Oral Tablet take 1 tablet by mouth every 24  hours if needed   . SPIRIVA RESPIMAT 1.25 mcg/actuation Inhalation Mist inhale 1 puff by mouth and INTO THE LUNGS once daily   . SYMBICORT 160-4.5 mcg/actuation Inhalation HFA Aerosol Inhaler inhale 2 puffs by mouth twice a day Rinse mouth after use   . triamterene-hydroCHLOROthiazide (MAXZIDE-25) 37.5-25 mg Oral Tablet Take 1 Tablet by mouth Every morning Indications: high blood pressure   . zafirlukast (ACCOLATE) 20 mg Oral Tablet take 1 tablet by mouth twice a day before meals   . zolpidem (AMBIEN) 10 mg Oral Tablet take 1 tablet by mouth at bedtime if needed     No Known Allergies  Past Medical History:   Diagnosis Date   . Arthralgia of hip 03/21/2018   . Arthralgia of right upper arm 03/30/2016   . Asthma    . Atherosclerosis of both carotid arteries 03/04/2020   . Cervicalgia 03/21/2018   . Chronic abdominal pain 10/04/2018   . Chronic back  pain 03/21/2018   . Chronic chest pain 03/21/2018   . Chronic insomnia    . Constipation in male 09/13/2017   . Diabetes mellitus, type 2 (CMS HCC)    . Esophageal reflux    . 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 Hepatic lesion 10/04/2018   . Hyperlipidemia    . Hypertension    . Impingement syndrome of left shoulder 12/28/2017   . Mild persistent asthma without complication 92/42/6834   . NAFL (nonalcoholic fatty liver)    . Normal cardiac stress test 03/21/2018   . Nosebleed    . Obesity    . 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 Partial tear of common extensor tendon of elbow 12/13/2017   . Pituitary tumor    . Pre-diabetes    . Rib pain 10/04/2018   . Sexual dysfunction    . Tear of talofibular ligament 05/30/2013    RIGHT   . Vitamin D deficiency          Past Surgical History:   Procedure Laterality Date   . COLONOSCOPY     . ELBOW SURGERY Left 12/2017   . HX OTHER  2004     SPINAL FUSION,ANT,EA ADNL LEVEL c3-c7   . HX OTHER Right     right great toe   . HX SHOULDER SURGERY Bilateral    . NOSE SURGERY           Family Medical History:     Problem Relation (Age of Onset)    Cancer Mother, Sister          Social History     Tobacco Use   . Smoking status: Never   . Smokeless tobacco: Never   Vaping Use   . Vaping Use: Never used   Substance Use Topics   . Alcohol use: Not Currently   . Drug use: Never       Review of Systems   Constitutional: Positive for fatigue and fever.   HENT: Positive for congestion, rhinorrhea, sinus pressure, sinus pain and sore throat.    Respiratory: Positive for cough. Negative for shortness of breath and wheezing.    Cardiovascular: Negative for chest pain.   Gastrointestinal: Positive for nausea and vomiting.   Neurological: Negative for dizziness and headaches.     Objective:   Vitals: BP 120/80   Pulse (!) 101   Temp 36.1 C (96.9 F)   Resp 18   Ht 1.753 m (5\' 9" )   Wt 103 kg (228 lb)   SpO2  96%   BMI 33.67 kg/m         Physical Exam  Constitutional:        Appearance: Normal appearance.   HENT:      Head: Normocephalic and atraumatic.      Right Ear: A middle ear effusion is present.      Left Ear: A middle ear effusion is present.      Nose: Congestion present.      Mouth/Throat:      Pharynx: Posterior oropharyngeal erythema present.   Cardiovascular:      Rate and Rhythm: Normal rate and regular rhythm.   Pulmonary:      Effort: Pulmonary effort is normal.      Breath sounds: Normal breath sounds and air entry.   Neurological:      Mental Status: He is alert and oriented to person, place, and time.         Assessment & Plan:     (L89.21) Chills  (primary encounter diagnosis)  Plan: POCT Rapid Flu, COVID-19 SCREENING - OUTPATIENT        AND DRIVE UP TESTING, Rapid Influenza A/B         Antigen        covid and influenza testing as ordered.  Recommended symptomatic care and self quarantine.  Zofran sent for nausea.  With any new or worsening symptoms recommend the ER.     (R52) Body aches  Plan: COVID-19 SCREENING - OUTPATIENT AND DRIVE UP         TESTING, Rapid Influenza A/B Antigen        As above.      (R05.9) Cough, unspecified type  Plan: COVID-19 SCREENING - OUTPATIENT AND DRIVE UP         TESTING, Rapid Influenza A/B Antigen        As above.    Orders Placed This Encounter   . Rapid Influenza A/B Antigen   . COVID-19 SCREENING - OUTPATIENT AND DRIVE UP TESTING   . POCT Rapid Flu   . ondansetron (ZOFRAN ODT) 4 mg Oral Tablet, Rapid Dissolve   . albuterol sulfate (PROVENTIL OR VENTOLIN OR PROAIR) 90 mcg/actuation Inhalation oral inhaler   . loratadine (CLARITIN) 10 mg Oral Tablet   . Benzonatate (TESSALON) 200 mg Oral Capsule     The patient was instructed to call office or go to the ER with any new or worsening symptoms.        Baird Cancer, CRNP

## 2021-04-29 ENCOUNTER — Telehealth (INDEPENDENT_AMBULATORY_CARE_PROVIDER_SITE_OTHER): Payer: Self-pay | Admitting: Family

## 2021-04-29 ENCOUNTER — Encounter (INDEPENDENT_AMBULATORY_CARE_PROVIDER_SITE_OTHER): Payer: Self-pay | Admitting: Family Medicine

## 2021-04-29 LAB — COVID-19 SCREENING - SEND-OUT: SARS-COV-2 RNA: DETECTED — AB

## 2021-04-29 NOTE — Telephone Encounter (Signed)
Patient is aware and verbally understands.

## 2021-04-29 NOTE — Telephone Encounter (Signed)
Patient is covid positive.  Please quarantine per cdc guidelines.  He is outside of the window for paxlovid.  Please continue symptomatic treatment.

## 2021-04-29 NOTE — Telephone Encounter (Signed)
Last Visit:04/28/2021     Upcoming appointments: 09/29/2021           Steve Young, [place]  04/29/2021, 08:41

## 2021-04-30 ENCOUNTER — Other Ambulatory Visit (INDEPENDENT_AMBULATORY_CARE_PROVIDER_SITE_OTHER): Payer: Self-pay | Admitting: Family Medicine

## 2021-04-30 DIAGNOSIS — F4310 Post-traumatic stress disorder, unspecified: Secondary | ICD-10-CM

## 2021-04-30 DIAGNOSIS — F5104 Psychophysiologic insomnia: Secondary | ICD-10-CM

## 2021-04-30 NOTE — Telephone Encounter (Signed)
Last Visit:04/28/21    Upcoming appointments: 09/29/21          [name], [place]  04/30/2021, 09:46

## 2021-05-01 ENCOUNTER — Other Ambulatory Visit (INDEPENDENT_AMBULATORY_CARE_PROVIDER_SITE_OTHER): Payer: Self-pay | Admitting: Family Medicine

## 2021-05-01 DIAGNOSIS — G8929 Other chronic pain: Secondary | ICD-10-CM

## 2021-05-01 DIAGNOSIS — M170 Bilateral primary osteoarthritis of knee: Secondary | ICD-10-CM

## 2021-05-01 NOTE — Telephone Encounter (Signed)
Last Visit:04/28/2021     Upcoming appointments: 09/29/2021           [name], [place]  05/01/2021, 10:25

## 2021-05-14 ENCOUNTER — Other Ambulatory Visit (INDEPENDENT_AMBULATORY_CARE_PROVIDER_SITE_OTHER): Payer: Self-pay | Admitting: Family Medicine

## 2021-05-14 NOTE — Telephone Encounter (Signed)
Last Visit: 04/28/2021    Next Visit: 09/29/2021    Gaylord Shih, MA  05/14/2021 10:46

## 2021-05-17 ENCOUNTER — Other Ambulatory Visit (INDEPENDENT_AMBULATORY_CARE_PROVIDER_SITE_OTHER): Payer: Self-pay | Admitting: Family Medicine

## 2021-05-17 DIAGNOSIS — J4540 Moderate persistent asthma, uncomplicated: Secondary | ICD-10-CM

## 2021-05-19 NOTE — Telephone Encounter (Signed)
Last Visit:04/28/2021     Upcoming appointments: 09/29/2021           [name], [place]  05/19/2021, 09:16

## 2021-05-27 ENCOUNTER — Other Ambulatory Visit (INDEPENDENT_AMBULATORY_CARE_PROVIDER_SITE_OTHER): Payer: Self-pay | Admitting: Family Medicine

## 2021-05-27 NOTE — Telephone Encounter (Signed)
Last Visit:04/28/21    Upcoming appointments: 09/29/21          [name], [place]  05/27/2021, 10:16

## 2021-06-03 ENCOUNTER — Ambulatory Visit (HOSPITAL_COMMUNITY): Payer: Self-pay

## 2021-06-08 ENCOUNTER — Other Ambulatory Visit: Payer: Self-pay

## 2021-06-08 ENCOUNTER — Inpatient Hospital Stay
Admission: RE | Admit: 2021-06-08 | Discharge: 2021-06-08 | Disposition: A | Discharge status: Home / Self Care | Payer: Medicare (Managed Care) | Source: Ambulatory Visit | Attending: Family Medicine | Admitting: Family Medicine

## 2021-06-08 DIAGNOSIS — R42 Dizziness and giddiness: Secondary | ICD-10-CM | POA: Insufficient documentation

## 2021-06-08 DIAGNOSIS — I10 Essential (primary) hypertension: Secondary | ICD-10-CM | POA: Insufficient documentation

## 2021-06-09 DIAGNOSIS — I10 Essential (primary) hypertension: Secondary | ICD-10-CM

## 2021-06-09 DIAGNOSIS — R42 Dizziness and giddiness: Secondary | ICD-10-CM

## 2021-06-09 LAB — ECG 12 LEAD
Atrial Rate: 72 {beats}/min
Calculated P Axis: 31 degrees
Calculated R Axis: 25 degrees
Calculated T Axis: 29 degrees
PR Interval: 242 ms
QRS Duration: 86 ms
QT Interval: 394 ms
QTC Calculation: 431 ms
Ventricular rate: 72 {beats}/min

## 2021-06-10 ENCOUNTER — Other Ambulatory Visit (INDEPENDENT_AMBULATORY_CARE_PROVIDER_SITE_OTHER): Payer: Self-pay | Admitting: Family

## 2021-06-10 NOTE — Telephone Encounter (Signed)
Last Visit:04/28/2021     Upcoming appointments: 09/29/2021           [name], [place]  06/10/2021, 10:06

## 2021-06-19 ENCOUNTER — Other Ambulatory Visit (INDEPENDENT_AMBULATORY_CARE_PROVIDER_SITE_OTHER): Payer: Self-pay | Admitting: Family Medicine

## 2021-06-19 NOTE — Telephone Encounter (Signed)
Last Visit:04/28/2021     Upcoming appointments: 09/29/2021           [name], [place]  06/19/2021, 10:06

## 2021-06-25 ENCOUNTER — Other Ambulatory Visit (INDEPENDENT_AMBULATORY_CARE_PROVIDER_SITE_OTHER): Payer: Self-pay | Admitting: Family Medicine

## 2021-06-25 DIAGNOSIS — M170 Bilateral primary osteoarthritis of knee: Secondary | ICD-10-CM

## 2021-06-25 DIAGNOSIS — M7918 Myalgia, other site: Secondary | ICD-10-CM

## 2021-06-25 NOTE — Telephone Encounter (Signed)
Last Visit:04/28/2021     Upcoming appointments: 09/29/2021           [name], Steve Young  06/25/2021, 09:41

## 2021-06-27 ENCOUNTER — Other Ambulatory Visit (INDEPENDENT_AMBULATORY_CARE_PROVIDER_SITE_OTHER): Payer: Self-pay | Admitting: Family Medicine

## 2021-06-27 DIAGNOSIS — F4310 Post-traumatic stress disorder, unspecified: Secondary | ICD-10-CM

## 2021-06-27 DIAGNOSIS — F5104 Psychophysiologic insomnia: Secondary | ICD-10-CM

## 2021-06-29 NOTE — Telephone Encounter (Signed)
Last Visit5.16.23  Upcoming appointments: 12.13.22          Raiford Noble, MA  06/29/2021, 08:25

## 2021-07-02 ENCOUNTER — Other Ambulatory Visit (INDEPENDENT_AMBULATORY_CARE_PROVIDER_SITE_OTHER): Payer: Self-pay | Admitting: Family Medicine

## 2021-07-02 DIAGNOSIS — F4310 Post-traumatic stress disorder, unspecified: Secondary | ICD-10-CM

## 2021-07-02 DIAGNOSIS — F5104 Psychophysiologic insomnia: Secondary | ICD-10-CM

## 2021-07-02 NOTE — Telephone Encounter (Signed)
Last Visit:12.13.22    Upcoming appointments: 5.16.23          Raiford Noble, [place]  07/02/2021, 18:03

## 2021-07-03 ENCOUNTER — Other Ambulatory Visit (INDEPENDENT_AMBULATORY_CARE_PROVIDER_SITE_OTHER): Payer: Self-pay | Admitting: Family Medicine

## 2021-07-03 DIAGNOSIS — F4310 Post-traumatic stress disorder, unspecified: Secondary | ICD-10-CM

## 2021-07-03 DIAGNOSIS — F5104 Psychophysiologic insomnia: Secondary | ICD-10-CM

## 2021-07-03 NOTE — Telephone Encounter (Signed)
Last Visit:04/28/2021     Upcoming appointments: 09/29/2021           Meribeth Mattes, RN  07/03/2021, 11:49

## 2021-07-06 ENCOUNTER — Ambulatory Visit (INDEPENDENT_AMBULATORY_CARE_PROVIDER_SITE_OTHER): Payer: Self-pay | Admitting: Family Medicine

## 2021-07-06 NOTE — Telephone Encounter (Signed)
RTC to pt. Informed that refill request was too soon. Informed that rx is written if once every night if needed therefore pt should not be taking medication every night. Patient was very angry. States that he "needs this medication every night!" therefore he takes it "every night!" offered appointment so that he may discuss other options re: sleeping with provider. Appt scheduled.

## 2021-07-06 NOTE — Telephone Encounter (Signed)
Regarding: med refill  ----- Message from [place] sent at 07/06/2021  3:45 PM EST -----  Pt is out of medication and medication has not been sent in. Please advise    ----- Message from Jules Husbands sent at 07/03/2021 11:31 AM EST -----  Wynonia Hazard, MD    Requesting med refill    zolpidem Lorrin Mais) 10 mg Oral Tablet    Preferred Pharmacy     [facility] [phone] [facility], [place]    [address] [place] [address]    Phone: [phone] Fax: [phone]    Hours: Not open 24 hours      Thanks  Jules Husbands

## 2021-07-07 ENCOUNTER — Other Ambulatory Visit: Payer: Self-pay

## 2021-07-07 ENCOUNTER — Encounter (INDEPENDENT_AMBULATORY_CARE_PROVIDER_SITE_OTHER): Payer: Self-pay | Admitting: Physician Assistant

## 2021-07-07 ENCOUNTER — Ambulatory Visit (INDEPENDENT_AMBULATORY_CARE_PROVIDER_SITE_OTHER): Payer: Medicare (Managed Care) | Admitting: Physician Assistant

## 2021-07-07 VITALS — BP 144/90 | HR 75 | Temp 96.7°F | Ht 69.0 in | Wt 233.0 lb

## 2021-07-07 DIAGNOSIS — G4700 Insomnia, unspecified: Secondary | ICD-10-CM

## 2021-07-07 MED ORDER — ZOLPIDEM 5 MG TABLET
ORAL_TABLET | ORAL | 0 refills | Status: AC
Start: 2021-07-07 — End: 2021-07-22

## 2021-07-07 NOTE — Progress Notes (Signed)
PRIMARY CARE, [place]  [place] [address]       Name: Steve Young MRN:  [id number]   Date: 07/07/2021 Age: 61 y.o.      Chief Complaint   Patient presents with   . Medication Refill/ Insomnia discussion      Subjective:  Steve Young is an 61 y.o. male who presents for an acute visit to discuss insomnia.  He was being prescribed 10 mg of Ambien to use nightly as needed for sleep aid by Dr Carlisle Cater.  Scripts were written to dispense 21 tablets to take 1 tablet by mouth at bedtime if needed.  It appears last script from 04/30/2021 with sent to dispense 21 tablets with 2 refills ordered.  Patient has exhausted all refills on this script and is upset as he did not receive any further refills from our office. He wants refills today.  He reports he has not been using this on an as-needed basis and has been taking this medication every night.  He feels his insomnia is severe and he needs to take this every night and not on an as-needed basis.  He reports several other sleep medicines have not worked well in the past.  He reports last dose of Ambien was approximately 2 nights ago.  Of note PDMP reviewed to reflect this chronic Ambien use as well as chronic Percocet use.     No Known Allergies    Current Outpatient Medications:   .  albuterol sulfate (PROVENTIL OR VENTOLIN OR PROAIR) 90 mcg/actuation Inhalation oral inhaler, inhale 1 to 2 puffs by mouth and INTO THE LUNGS every 6 hours if needed, Disp: 18 g, Rfl: 1  .  amLODIPine (NORVASC) 10 mg Oral Tablet, take 1 tablet by mouth once daily, Disp: 90 Tablet, Rfl: 1  .  aspirin (ECOTRIN) 81 mg Oral Tablet, Delayed Release (E.C.), Take 1 Tablet (81 mg total) by mouth Once a day, Disp: 90 Tablet, Rfl: 3  .  atorvastatin (LIPITOR) 80 mg Oral Tablet, take 1 tablet by mouth at bedtime, Disp: 90 Tablet, Rfl: 3  .  Benzonatate (TESSALON) 200 mg Oral Capsule, Take 1 Capsule (200 mg total) by mouth Three times a day as  needed for Cough, Disp: 30 Capsule, Rfl: 0  .  cholecalciferol, vitamin D3, 125 mcg (5,000 unit) Oral Capsule, take 1 capsule by mouth once daily, Disp: 90 Capsule, Rfl: 1  .  cyclobenzaprine (FLEXERIL) 10 mg Oral Tablet, Take 1 Tablet (10 mg total) by mouth Every night as needed for Muscle spasms Indications: muscle spasm, Disp: 30 Tablet, Rfl: 2  .  diclofenac sodium (VOLTAREN) 1 % Gel, by Apply Topically route Four times a day - before meals and bedtime, Disp: 20 g, Rfl: 3  .  folic acid (FOLVITE) 1 mg Oral Tablet, take 1 tablet by mouth once daily, Disp: 90 Tablet, Rfl: 3  .  gabapentin (NEURONTIN) 600 mg Oral Tablet, take 1 tablet by mouth three times a day, Disp: 90 Tablet, Rfl: 3  .  Ibuprofen (MOTRIN) 800 mg Oral Tablet, take 1 tablet by mouth three times a day if needed for pain, Disp: 60 Tablet, Rfl: 1  .  loratadine (CLARITIN) 10 mg Oral Tablet, Take 1 Tablet (10 mg total) by mouth Once a day, Disp: 30 Tablet, Rfl: 0  .  losartan (COZAAR) 100 mg Oral Tablet, take 1 tablet by mouth once daily, Disp: 30 Tablet, Rfl: 5  .  lubiprostone (AMITIZA) 24 mcg Oral Capsule, Take 1 Cap (24 mcg total) by mouth Twice daily, Disp: 60 Cap, Rfl: 3  .  magnesium oxide (MAG-OX) 400 mg (241.3 mg magnesium) Oral Tablet, Take 1 Tablet (400 mg total) by mouth, Disp: , Rfl:   .  meclizine (ANTIVERT) 25 mg Oral Tablet, Take 1 Tablet (25 mg total) by mouth Every 8 hours as needed for Dizziness or Nausea/Vomiting Indications: motion sickness, sensation of spinning or whirling, Disp: 30 Tablet, Rfl: 3  .  metFORMIN (GLUCOPHAGE) 500 mg Oral Tablet, take 1 tablet by mouth every morning WITH BREAKFAST, Disp: 90 Tablet, Rfl: 1  .  mupirocin (BACTROBAN) 2 % Ointment, by Apply Topically route Three times a day, Disp: 30 g, Rfl: 3  .  omega-3 fatty acid (LOVAZA) 1 gram Oral Capsule, take 2 capsules by mouth twice a day, Disp: 360 Capsule, Rfl: 0  .  omeprazole (PRILOSEC) 40 mg Oral Capsule, Delayed Release(E.C.), take 1 capsule by mouth  once daily, Disp: 60 Capsule, Rfl: 1  .  ondansetron (ZOFRAN ODT) 4 mg Oral Tablet, Rapid Dissolve, Take 1 Tablet (4 mg total) by mouth Every 8 hours as needed for Nausea/Vomiting, Disp: 20 Tablet, Rfl: 0  .  oxyCODONE-acetaminophen (PERCOCET) 10-325 mg Oral Tablet, take 1 tablet by mouth three times a day if needed for pain, Disp: , Rfl: 0  .  PARoxetine (PAXIL) 20 mg Oral Tablet, take 1 tablet by mouth once daily, Disp: 90 Tablet, Rfl: 3  .  Sildenafil (VIAGRA) 25 mg Oral Tablet, take 1 tablet by mouth every 24 hours if needed, Disp: 10 Tab, Rfl: 1  .  SPIRIVA RESPIMAT 1.25 mcg/actuation Inhalation Mist, inhale 1 puff by mouth and INTO THE LUNGS once daily, Disp: 4 g, Rfl: 2  .  SYMBICORT 160-4.5 mcg/actuation Inhalation HFA Aerosol Inhaler, inhale 2 puffs by mouth twice a day Rinse mouth after use, Disp: 10.2 g, Rfl: 11  .  triamterene-hydroCHLOROthiazide (MAXZIDE-25) 37.5-25 mg Oral Tablet, Take 1 Tablet by mouth Every morning Indications: high blood pressure, Disp: 90 Tablet, Rfl: 3  .  zafirlukast (ACCOLATE) 20 mg Oral Tablet, take 1 tablet by mouth twice a day before meals, Disp: 120 Tablet, Rfl: 5  .  zolpidem (AMBIEN) 10 mg nightly p.r.n.    Past Medical History:   Diagnosis Date   . Arthralgia of hip 03/21/2018   . Arthralgia of right upper arm 03/30/2016   . Asthma    . Atherosclerosis of both carotid arteries 03/04/2020   . Cervicalgia 03/21/2018   . Chronic abdominal pain 10/04/2018   . Chronic back pain 03/21/2018   . Chronic chest pain 03/21/2018   . Chronic insomnia    . Constipation in male 09/13/2017   . COVID-19 04/28/2021   . Diabetes mellitus, type 2 (CMS HCC)    . Esophageal reflux    . 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 Hepatic lesion 10/04/2018   . Hyperlipidemia    . Hypertension    . Impingement syndrome of left shoulder 12/28/2017   . Mild persistent asthma without complication 23/53/6144   . NAFL (nonalcoholic fatty liver)    . Normal cardiac stress test 03/21/2018   . Nosebleed    .  Obesity    . 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 Partial tear of common extensor tendon of elbow 12/13/2017   . Pituitary tumor    . Pre-diabetes    . Rib pain 10/04/2018   . Sexual dysfunction    .  Tear of talofibular ligament 05/30/2013    RIGHT   . Vitamin D deficiency      Past Surgical History:   Procedure Laterality Date   . COLONOSCOPY     . ELBOW SURGERY Left 12/2017   . HX OTHER  2004     SPINAL FUSION,ANT,EA ADNL LEVEL c3-c7   . HX OTHER Right     right great toe   . HX SHOULDER SURGERY Bilateral    . NOSE SURGERY       Social History     Tobacco Use   . Smoking status: Never   . Smokeless tobacco: Never   Vaping Use   . Vaping Use: Never used   Substance Use Topics   . Alcohol use: Not Currently   . Drug use: Never        ROS:  See HPI Above. (limited due to patient agitation)  Constitutional: Denies fevers, chills  ENT: Denies nasal congestion, sore throat, ear pain   Respiratory: Denies Cough, SOB, or wheezing.  Psych:  Admits insomnia and sleep difficulty.      Objective:  Physical Exam:  BP (!) 144/90 (Site: Left, Patient Position: Sitting, Cuff Size: Adult Large)   Pulse 75   Temp 35.9 C (96.7 F) (Thermal Scan)   Ht 1.753 m (5\' 9" )   Wt 106 kg (233 lb)   SpO2 94%   BMI 34.41 kg/m     General: Patient is alert and oriented.  He is noted to be increasingly agitated during office visit and does raise his voice and cuss several times.  Patient does terminate visit before further physical exam is able to be completed.  Head: normocephalic, atraumatic  Neurologic: Alert and oriented. No focal neurological deficits noted.   Muscluoskeltal: Gait is grossly normal. Pt ambulates without the aid of assistive devices.   Psychiatric: Patient is agitated as stated above.  Speech is clear.    Assessment and Plan:  Orders Placed This Encounter   . Refer to [facility]   . zolpidem (AMBIEN) 5 mg Oral Tablet         ICD-10-CM    1. Insomnia, unspecified type  G47.00  zolpidem (AMBIEN) 5 mg Oral Tablet     Refer to [facility]        Discussed with patient today my concerns that he is likely over using Ambien.  We did discuss current dosing recommendations of this medication are meant to be short-term only. I also have safety concerns regarding this medication as patient is also on chronic Percocet per PDMP review.  I do not feel use of Ambien chronically is safe and will not agree to take over his current dosing of this medication.  I will provide 5 mg tabs for taper and discontinuation of Ambien today.  Will also provide referral to [facility] for patient to further discuss safe treatment options for insomnia if desired.  Unfortunately patient does become upset at these recommendations and leaves office aggitated and it is unclear at this time if patient will continue to follow with this practice however he was made aware it is his choice to establish care with a new practice if desired.    On the day of the encounter, a total of 20 minutes was spent on this patient encounter including review of historical information, examination, documentation and post-visit activities. The time documented excludes procedural time.    Patient seen by: Lannie Fields, PA-C  The supervising/collaborating physician for this visit was Dr. Lockie Pares    Please note this report may have been transcribed using voice recognition software and could contain mild typographical errors.

## 2021-07-07 NOTE — Nursing Note (Signed)
Patient is here to discuss sleeping difficulty. Patient would like refill of Ambien.

## 2021-07-08 ENCOUNTER — Other Ambulatory Visit (INDEPENDENT_AMBULATORY_CARE_PROVIDER_SITE_OTHER): Payer: Self-pay | Admitting: Family Medicine

## 2021-07-08 NOTE — Telephone Encounter (Signed)
Last Visit: 07/07/2021    Next Visit: 09/29/2021    Gaylord Shih, MA  07/08/2021 09:40

## 2021-07-22 ENCOUNTER — Other Ambulatory Visit (INDEPENDENT_AMBULATORY_CARE_PROVIDER_SITE_OTHER): Payer: Self-pay | Admitting: Family

## 2021-07-22 NOTE — Telephone Encounter (Signed)
Last Visit:07/07/2021     Upcoming appointments: 09/29/2021           [name], [place]  07/22/2021, 10:08

## 2021-07-31 ENCOUNTER — Other Ambulatory Visit (INDEPENDENT_AMBULATORY_CARE_PROVIDER_SITE_OTHER): Payer: Self-pay | Admitting: Physician Assistant

## 2021-07-31 DIAGNOSIS — M170 Bilateral primary osteoarthritis of knee: Secondary | ICD-10-CM

## 2021-07-31 DIAGNOSIS — G8929 Other chronic pain: Secondary | ICD-10-CM

## 2021-07-31 NOTE — Telephone Encounter (Signed)
Last Visit:07/07/2021     Upcoming appointments: 09/29/2021           [name], [place]  07/31/2021, 09:44

## 2021-08-06 ENCOUNTER — Other Ambulatory Visit (INDEPENDENT_AMBULATORY_CARE_PROVIDER_SITE_OTHER): Payer: Self-pay | Admitting: Physician Assistant

## 2021-08-06 DIAGNOSIS — J4540 Moderate persistent asthma, uncomplicated: Secondary | ICD-10-CM

## 2021-08-06 NOTE — Telephone Encounter (Signed)
Last Visit: 07/07/2021    Next Visit: 09/29/2021    Gaylord Shih, MA  08/06/2021 09:54

## 2021-08-19 ENCOUNTER — Other Ambulatory Visit (INDEPENDENT_AMBULATORY_CARE_PROVIDER_SITE_OTHER): Payer: Self-pay | Admitting: Physician Assistant

## 2021-08-19 DIAGNOSIS — M170 Bilateral primary osteoarthritis of knee: Secondary | ICD-10-CM

## 2021-08-19 DIAGNOSIS — M7918 Myalgia, other site: Secondary | ICD-10-CM

## 2021-08-19 NOTE — Telephone Encounter (Signed)
Last Visit:07/07/2021     Upcoming appointments: 09/29/2021           [name], [place]  08/19/2021, 09:58

## 2021-08-22 ENCOUNTER — Other Ambulatory Visit (INDEPENDENT_AMBULATORY_CARE_PROVIDER_SITE_OTHER): Payer: Self-pay | Admitting: Physician Assistant

## 2021-08-24 ENCOUNTER — Other Ambulatory Visit (INDEPENDENT_AMBULATORY_CARE_PROVIDER_SITE_OTHER): Payer: Self-pay | Admitting: Family Medicine

## 2021-08-24 NOTE — Telephone Encounter (Signed)
Last Visit:07/07/21     Upcoming appointments: 09/29/21          [name], [place]  08/24/2021, 10:10

## 2021-08-24 NOTE — Telephone Encounter (Signed)
Last Visit:07/07/21    Upcoming appointments: 09/29/21          [name], [place]  08/24/2021, 09:34

## 2021-08-25 ENCOUNTER — Other Ambulatory Visit (INDEPENDENT_AMBULATORY_CARE_PROVIDER_SITE_OTHER): Payer: Self-pay | Admitting: INTERNAL MEDICINE

## 2021-08-25 NOTE — Telephone Encounter (Signed)
Last Visit: 06/25/2020    Next Visit: Visit date not found    Gaylord Shih, [place]  08/25/2021 10:28

## 2021-08-31 ENCOUNTER — Other Ambulatory Visit (INDEPENDENT_AMBULATORY_CARE_PROVIDER_SITE_OTHER): Payer: Self-pay | Admitting: Family Medicine

## 2021-09-01 ENCOUNTER — Other Ambulatory Visit (INDEPENDENT_AMBULATORY_CARE_PROVIDER_SITE_OTHER): Payer: Self-pay | Admitting: Internal Medicine

## 2021-09-01 DIAGNOSIS — J4540 Moderate persistent asthma, uncomplicated: Secondary | ICD-10-CM

## 2021-09-01 NOTE — Telephone Encounter (Signed)
Last Visit: 07/07/2021    Next Visit: 09/29/2021    Gaylord Shih, MA  09/01/2021 11:59

## 2021-09-01 NOTE — Telephone Encounter (Signed)
Last Visit: 07/07/2021    Next Visit: 09/29/2021    Gaylord Shih, MA  09/01/2021 16:04

## 2021-09-02 ENCOUNTER — Other Ambulatory Visit (INDEPENDENT_AMBULATORY_CARE_PROVIDER_SITE_OTHER): Payer: Self-pay | Admitting: Family

## 2021-09-02 NOTE — Telephone Encounter (Signed)
Last Visit:07/07/2021     Upcoming appointments: 09/29/2021           Raiford Noble, [place]  09/02/2021, 10:14

## 2021-09-07 ENCOUNTER — Other Ambulatory Visit (INDEPENDENT_AMBULATORY_CARE_PROVIDER_SITE_OTHER): Payer: Self-pay | Admitting: Family Medicine

## 2021-09-07 DIAGNOSIS — M170 Bilateral primary osteoarthritis of knee: Secondary | ICD-10-CM

## 2021-09-07 DIAGNOSIS — M7918 Myalgia, other site: Secondary | ICD-10-CM

## 2021-09-07 NOTE — Telephone Encounter (Signed)
Last Visit:07/07/2021     Upcoming appointments: 09/29/2021           [name], [place]  09/07/2021, 10:12

## 2021-09-15 ENCOUNTER — Ambulatory Visit (HOSPITAL_BASED_OUTPATIENT_CLINIC_OR_DEPARTMENT_OTHER): Payer: Self-pay | Admitting: Nurse Practitioner

## 2021-09-21 ENCOUNTER — Other Ambulatory Visit (INDEPENDENT_AMBULATORY_CARE_PROVIDER_SITE_OTHER): Payer: Self-pay | Admitting: Family Medicine

## 2021-09-21 NOTE — Telephone Encounter (Signed)
Last Visit:07/07/2021     Upcoming appointments: 09/29/2021           [name], [place]  09/21/2021, 09:53

## 2021-09-25 ENCOUNTER — Other Ambulatory Visit (INDEPENDENT_AMBULATORY_CARE_PROVIDER_SITE_OTHER): Payer: Self-pay | Admitting: Family Medicine

## 2021-09-25 DIAGNOSIS — M170 Bilateral primary osteoarthritis of knee: Secondary | ICD-10-CM

## 2021-09-25 DIAGNOSIS — M7918 Myalgia, other site: Secondary | ICD-10-CM

## 2021-09-25 NOTE — Telephone Encounter (Signed)
Last Visit:07/07/2021     Upcoming appointments: 09/29/2021           [name], [place]  09/25/2021, 09:38

## 2021-09-29 ENCOUNTER — Other Ambulatory Visit (INDEPENDENT_AMBULATORY_CARE_PROVIDER_SITE_OTHER): Payer: Self-pay | Admitting: Family Medicine

## 2021-09-29 ENCOUNTER — Encounter (INDEPENDENT_AMBULATORY_CARE_PROVIDER_SITE_OTHER): Payer: Self-pay | Admitting: Family Medicine

## 2021-09-29 DIAGNOSIS — J4541 Moderate persistent asthma with (acute) exacerbation: Secondary | ICD-10-CM

## 2021-09-29 NOTE — Telephone Encounter (Signed)
Last Visit:07/07/21    Upcoming appointments: 09/28/21          Steve Young, [place]  09/29/2021, 09:40

## 2021-10-05 ENCOUNTER — Ambulatory Visit (INDEPENDENT_AMBULATORY_CARE_PROVIDER_SITE_OTHER): Payer: Self-pay | Admitting: Family Medicine

## 2021-10-05 NOTE — Telephone Encounter (Signed)
RTC to pt. Informed office does not call in medication he will need an appt. States he will call back to schedule once he can find transportation.

## 2021-10-05 NOTE — Telephone Encounter (Signed)
Regarding: med request  ----- Message from Flonnie Hailstone II sent at 10/05/2021  2:14 PM EDT -----  Wynonia Hazard, MD    Pt calling in requesting penicillin for an infected tooth. Pt declines any appointment offered and states just send the message. Advise.    Preferred Pharmacy     [facility] [phone] [place] - [address]    [address] [place] [address]    Phone: [phone] Fax: [phone]    Hours: Not open 24 hours

## 2021-10-07 ENCOUNTER — Other Ambulatory Visit (INDEPENDENT_AMBULATORY_CARE_PROVIDER_SITE_OTHER): Payer: Self-pay | Admitting: Internal Medicine

## 2021-10-07 NOTE — Telephone Encounter (Signed)
Last Visit:07/07/2021     Upcoming appointments: Visit date not found            -Hughes, [place]  10/07/2021, 10:21

## 2021-10-16 ENCOUNTER — Other Ambulatory Visit (INDEPENDENT_AMBULATORY_CARE_PROVIDER_SITE_OTHER): Payer: Self-pay | Admitting: Family Medicine

## 2021-10-16 DIAGNOSIS — M170 Bilateral primary osteoarthritis of knee: Secondary | ICD-10-CM

## 2021-10-16 DIAGNOSIS — M7918 Myalgia, other site: Secondary | ICD-10-CM

## 2021-10-16 NOTE — Telephone Encounter (Signed)
Last Visit:07/07/2021     Upcoming appointments: Visit date not found           [name], [place]  10/16/2021, 10:09

## 2021-10-18 ENCOUNTER — Other Ambulatory Visit (INDEPENDENT_AMBULATORY_CARE_PROVIDER_SITE_OTHER): Payer: Self-pay | Admitting: Family Medicine

## 2021-10-18 DIAGNOSIS — J4541 Moderate persistent asthma with (acute) exacerbation: Secondary | ICD-10-CM

## 2021-10-19 NOTE — Telephone Encounter (Signed)
Last Visit:07/07/21    Upcoming appointments: Visit date not found           Steve Young, [place]  10/19/2021, 08:52

## 2021-10-20 ENCOUNTER — Other Ambulatory Visit (INDEPENDENT_AMBULATORY_CARE_PROVIDER_SITE_OTHER): Payer: Self-pay | Admitting: Family Medicine

## 2021-10-20 NOTE — Telephone Encounter (Signed)
Last Visit:07/07/2021     Upcoming appointments: Visit date not found           Steve Young, [place]  10/20/2021, 09:47

## 2021-10-21 ENCOUNTER — Other Ambulatory Visit (INDEPENDENT_AMBULATORY_CARE_PROVIDER_SITE_OTHER): Payer: Self-pay | Admitting: Family Medicine

## 2021-10-21 DIAGNOSIS — R7303 Prediabetes: Secondary | ICD-10-CM

## 2021-10-21 NOTE — Telephone Encounter (Signed)
Last Visit:07/07/2021     Upcoming appointments: Visit date not found           [name], [place]  10/21/2021, 09:45

## 2021-11-03 ENCOUNTER — Other Ambulatory Visit (INDEPENDENT_AMBULATORY_CARE_PROVIDER_SITE_OTHER): Payer: Self-pay | Admitting: Family Medicine

## 2021-11-03 DIAGNOSIS — M170 Bilateral primary osteoarthritis of knee: Secondary | ICD-10-CM

## 2021-11-03 DIAGNOSIS — M7918 Myalgia, other site: Secondary | ICD-10-CM

## 2021-11-03 NOTE — Telephone Encounter (Signed)
Last Visit:07/07/2021     Upcoming appointments: Visit date not found           Raiford Noble, [place]  11/03/2021, 09:52

## 2021-11-06 ENCOUNTER — Other Ambulatory Visit (INDEPENDENT_AMBULATORY_CARE_PROVIDER_SITE_OTHER): Payer: Self-pay | Admitting: Family Medicine

## 2021-11-06 NOTE — Telephone Encounter (Signed)
Last Visit:06/25/2020     Upcoming appointments: Visit date not found            -Hughes, [place]  11/06/2021, 10:21

## 2021-11-17 ENCOUNTER — Other Ambulatory Visit (INDEPENDENT_AMBULATORY_CARE_PROVIDER_SITE_OTHER): Payer: Self-pay | Admitting: Family Medicine

## 2021-11-18 NOTE — Telephone Encounter (Signed)
Last Visit: 07/07/21    Upcoming appointments: Visit date not found            -Hughes, Steve Young  11/18/2021, 06:45

## 2021-11-20 ENCOUNTER — Other Ambulatory Visit (INDEPENDENT_AMBULATORY_CARE_PROVIDER_SITE_OTHER): Payer: Self-pay | Admitting: Family Medicine

## 2021-11-20 DIAGNOSIS — M170 Bilateral primary osteoarthritis of knee: Secondary | ICD-10-CM

## 2021-11-20 DIAGNOSIS — M7918 Myalgia, other site: Secondary | ICD-10-CM

## 2021-11-20 NOTE — Telephone Encounter (Signed)
Last Visit:07/07/2021     Upcoming appointments: Visit date not found           [name], [place]  11/20/2021, 09:42

## 2021-12-03 ENCOUNTER — Other Ambulatory Visit (INDEPENDENT_AMBULATORY_CARE_PROVIDER_SITE_OTHER): Payer: Self-pay | Admitting: Family Medicine

## 2021-12-03 NOTE — Telephone Encounter (Signed)
Last Visit:07/07/2021     Upcoming appointments: Visit date not found           [name], [place]  12/03/2021, 09:37

## 2021-12-09 ENCOUNTER — Telehealth (INDEPENDENT_AMBULATORY_CARE_PROVIDER_SITE_OTHER): Payer: Self-pay | Admitting: Family Medicine

## 2021-12-15 ENCOUNTER — Other Ambulatory Visit (INDEPENDENT_AMBULATORY_CARE_PROVIDER_SITE_OTHER): Payer: Self-pay | Admitting: Family Medicine

## 2021-12-15 DIAGNOSIS — J4541 Moderate persistent asthma with (acute) exacerbation: Secondary | ICD-10-CM

## 2021-12-15 NOTE — Telephone Encounter (Signed)
Last Visit:2.21.23    Upcoming appointments: Visit date not found           Steve Young, [place]  12/15/2021, 09:39

## 2021-12-19 ENCOUNTER — Other Ambulatory Visit (INDEPENDENT_AMBULATORY_CARE_PROVIDER_SITE_OTHER): Payer: Self-pay | Admitting: Family Medicine

## 2021-12-19 DIAGNOSIS — J4540 Moderate persistent asthma, uncomplicated: Secondary | ICD-10-CM

## 2021-12-21 NOTE — Telephone Encounter (Signed)
Last Visit:07/07/2021     Upcoming appointments: Visit date not found            , MA  12/21/2021, 08:47

## 2021-12-25 ENCOUNTER — Other Ambulatory Visit (INDEPENDENT_AMBULATORY_CARE_PROVIDER_SITE_OTHER): Payer: Self-pay | Admitting: Family Medicine

## 2021-12-25 NOTE — Telephone Encounter (Signed)
Last Visit:07/07/2021     Upcoming appointments: Visit date not found            , MA  12/25/2021, 09:44

## 2022-01-14 ENCOUNTER — Other Ambulatory Visit (INDEPENDENT_AMBULATORY_CARE_PROVIDER_SITE_OTHER): Payer: Self-pay | Admitting: Family Medicine

## 2022-01-14 DIAGNOSIS — E876 Hypokalemia: Secondary | ICD-10-CM

## 2022-01-14 DIAGNOSIS — I10 Essential (primary) hypertension: Secondary | ICD-10-CM

## 2022-01-14 DIAGNOSIS — R7303 Prediabetes: Secondary | ICD-10-CM

## 2022-01-14 NOTE — Telephone Encounter (Signed)
Last Visit:07/07/2021     Upcoming appointments: Visit date not found            , MA  01/14/2022, 09:41

## 2022-01-14 NOTE — Telephone Encounter (Signed)
Did he transfer care? No follow-up scheduled. Thank you

## 2022-01-14 NOTE — Telephone Encounter (Signed)
I tried calling patient, MB is full.

## 2022-01-15 ENCOUNTER — Other Ambulatory Visit (INDEPENDENT_AMBULATORY_CARE_PROVIDER_SITE_OTHER): Payer: Self-pay | Admitting: Family Medicine

## 2022-01-15 NOTE — Telephone Encounter (Signed)
Last Visit:07/07/2021     Upcoming appointments: Visit date not found            , MA  01/15/2022, 09:47

## 2022-01-17 ENCOUNTER — Other Ambulatory Visit (INDEPENDENT_AMBULATORY_CARE_PROVIDER_SITE_OTHER): Payer: Self-pay | Admitting: Family Medicine

## 2022-01-17 DIAGNOSIS — J4541 Moderate persistent asthma with (acute) exacerbation: Secondary | ICD-10-CM

## 2022-01-19 NOTE — Telephone Encounter (Signed)
Last Visit:07/07/21    Upcoming appointments: Visit date not found           Steve Young, [place]  01/19/2022, 08:33

## 2022-01-26 ENCOUNTER — Other Ambulatory Visit (INDEPENDENT_AMBULATORY_CARE_PROVIDER_SITE_OTHER): Payer: Self-pay | Admitting: Family Medicine

## 2022-01-26 NOTE — Telephone Encounter (Signed)
Last Visit:07/07/2021     Upcoming appointments: Visit date not found           Steve Young, [place]  01/26/2022, 10:12

## 2022-02-02 ENCOUNTER — Other Ambulatory Visit (INDEPENDENT_AMBULATORY_CARE_PROVIDER_SITE_OTHER): Payer: Self-pay | Admitting: Family Medicine

## 2022-02-02 NOTE — Telephone Encounter (Signed)
Tried to call pt vm is full.

## 2022-02-02 NOTE — Telephone Encounter (Signed)
Patient mentioned he was going to find new PCP?

## 2022-02-02 NOTE — Telephone Encounter (Signed)
Last Visit:04/28/2021    Upcoming appointments: Visit date not found            , MA  02/02/2022, 09:39

## 2022-02-08 ENCOUNTER — Other Ambulatory Visit (INDEPENDENT_AMBULATORY_CARE_PROVIDER_SITE_OTHER): Payer: Self-pay | Admitting: Family Medicine

## 2022-02-08 DIAGNOSIS — I10 Essential (primary) hypertension: Secondary | ICD-10-CM

## 2022-02-08 DIAGNOSIS — R7303 Prediabetes: Secondary | ICD-10-CM

## 2022-02-08 DIAGNOSIS — E876 Hypokalemia: Secondary | ICD-10-CM

## 2022-02-08 DIAGNOSIS — J4541 Moderate persistent asthma with (acute) exacerbation: Secondary | ICD-10-CM

## 2022-02-08 NOTE — Telephone Encounter (Signed)
Last Visit:07/07/2021     Upcoming appointments: Visit date not found            , MA  02/08/2022, 09:38

## 2022-02-09 ENCOUNTER — Other Ambulatory Visit (INDEPENDENT_AMBULATORY_CARE_PROVIDER_SITE_OTHER): Payer: Self-pay | Admitting: Family Medicine

## 2022-02-09 NOTE — Telephone Encounter (Signed)
Last Visit:07/07/2021     Upcoming appointments: Visit date not found           Steve Young, [place]  02/09/2022, 09:53

## 2022-02-28 ENCOUNTER — Other Ambulatory Visit (INDEPENDENT_AMBULATORY_CARE_PROVIDER_SITE_OTHER): Payer: Self-pay | Admitting: Family Medicine

## 2022-03-01 NOTE — Telephone Encounter (Signed)
Left message for pt to call the office to schedule a routine follow up, advised med was sent to pharmacy and he will be due for lab work as well.

## 2022-03-01 NOTE — Telephone Encounter (Signed)
Is he going to change PCP? If not please schedule within 6 months. Will be due for blood work this year. Thank you

## 2022-03-01 NOTE — Telephone Encounter (Signed)
Last Visit:07/07/2021    Upcoming appointments: Visit date not found            , MA  03/01/2022, 09:26

## 2022-03-04 ENCOUNTER — Other Ambulatory Visit (INDEPENDENT_AMBULATORY_CARE_PROVIDER_SITE_OTHER): Payer: Self-pay | Admitting: Family Medicine

## 2022-03-04 NOTE — Telephone Encounter (Signed)
Last Visit:07/07/2021     Upcoming appointments: Visit date not found            , MA  03/04/2022, 09:40

## 2022-03-05 ENCOUNTER — Other Ambulatory Visit (INDEPENDENT_AMBULATORY_CARE_PROVIDER_SITE_OTHER): Payer: Self-pay | Admitting: Family Medicine

## 2022-03-05 DIAGNOSIS — E876 Hypokalemia: Secondary | ICD-10-CM

## 2022-03-05 DIAGNOSIS — I10 Essential (primary) hypertension: Secondary | ICD-10-CM

## 2022-03-05 DIAGNOSIS — R7303 Prediabetes: Secondary | ICD-10-CM

## 2022-03-05 NOTE — Telephone Encounter (Signed)
Last Visit:07/07/2021     Upcoming appointments: Visit date not found            , MA  03/05/2022, 09:40

## 2022-03-09 ENCOUNTER — Telehealth (INDEPENDENT_AMBULATORY_CARE_PROVIDER_SITE_OTHER): Payer: Self-pay | Admitting: Family Medicine

## 2022-03-18 ENCOUNTER — Other Ambulatory Visit (INDEPENDENT_AMBULATORY_CARE_PROVIDER_SITE_OTHER): Payer: Self-pay | Admitting: Family Medicine

## 2022-03-18 NOTE — Telephone Encounter (Signed)
Last Visit:07/07/2021     Upcoming appointments: Visit date not found            , MA  03/18/2022, 09:39

## 2022-03-18 NOTE — Telephone Encounter (Signed)
L/m for pt to call and schedule follow up before end of year with Dr Carlisle Cater, advised refills were sent at well.

## 2022-03-18 NOTE — Telephone Encounter (Signed)
Scripts sent.  Needs follow-up appointment.  Overdue for follow-up.

## 2022-03-29 ENCOUNTER — Other Ambulatory Visit (INDEPENDENT_AMBULATORY_CARE_PROVIDER_SITE_OTHER): Payer: Self-pay | Admitting: Family Medicine

## 2022-03-29 NOTE — Telephone Encounter (Signed)
Last Visit:07/07/2021    Upcoming appointments: Visit date not found            , MA  03/29/2022, 09:54

## 2022-04-02 ENCOUNTER — Other Ambulatory Visit (INDEPENDENT_AMBULATORY_CARE_PROVIDER_SITE_OTHER): Payer: Self-pay | Admitting: Family Medicine

## 2022-04-02 DIAGNOSIS — R7303 Prediabetes: Secondary | ICD-10-CM

## 2022-04-02 DIAGNOSIS — I10 Essential (primary) hypertension: Secondary | ICD-10-CM

## 2022-04-02 DIAGNOSIS — E876 Hypokalemia: Secondary | ICD-10-CM

## 2022-04-02 NOTE — Telephone Encounter (Signed)
Last Visit:07/07/2021     Upcoming appointments: Visit date not found            , MA  04/02/2022, 09:53

## 2022-04-04 ENCOUNTER — Other Ambulatory Visit (INDEPENDENT_AMBULATORY_CARE_PROVIDER_SITE_OTHER): Payer: Self-pay | Admitting: Family Medicine

## 2022-04-04 DIAGNOSIS — J4541 Moderate persistent asthma with (acute) exacerbation: Secondary | ICD-10-CM

## 2022-04-05 NOTE — Telephone Encounter (Signed)
Last Visit:07/07/2021     Upcoming appointments: Visit date not found            , MA  04/05/2022, 08:46

## 2022-04-07 ENCOUNTER — Other Ambulatory Visit (INDEPENDENT_AMBULATORY_CARE_PROVIDER_SITE_OTHER): Payer: Self-pay | Admitting: Family Medicine

## 2022-04-07 DIAGNOSIS — J4540 Moderate persistent asthma, uncomplicated: Secondary | ICD-10-CM

## 2022-04-07 NOTE — Telephone Encounter (Signed)
Last Visit:07/07/2021     Upcoming appointments: Visit date not found            , MA  04/07/2022, 10:04

## 2022-04-09 ENCOUNTER — Other Ambulatory Visit (INDEPENDENT_AMBULATORY_CARE_PROVIDER_SITE_OTHER): Payer: Self-pay | Admitting: Internal Medicine

## 2022-04-12 NOTE — Telephone Encounter (Signed)
Last Visit:07/07/2021     Upcoming appointments: Visit date not found            -Hughes, [place]  04/12/2022, 07:53

## 2022-04-13 ENCOUNTER — Other Ambulatory Visit (INDEPENDENT_AMBULATORY_CARE_PROVIDER_SITE_OTHER): Payer: Self-pay | Admitting: Internal Medicine

## 2022-04-13 NOTE — Telephone Encounter (Signed)
Last Visit:07/07/2021     Upcoming appointments: Visit date not found            -Hughes, [place]  04/13/2022, 11:28

## 2022-05-03 ENCOUNTER — Ambulatory Visit (INDEPENDENT_AMBULATORY_CARE_PROVIDER_SITE_OTHER): Payer: Medicare (Managed Care) | Admitting: Family Medicine

## 2022-05-03 ENCOUNTER — Other Ambulatory Visit: Payer: Self-pay

## 2022-05-03 ENCOUNTER — Encounter (INDEPENDENT_AMBULATORY_CARE_PROVIDER_SITE_OTHER): Payer: Self-pay | Admitting: Family Medicine

## 2022-05-03 VITALS — BP 126/78 | HR 74 | Temp 96.6°F | Resp 18 | Ht 69.0 in | Wt 238.0 lb

## 2022-05-03 DIAGNOSIS — Z125 Encounter for screening for malignant neoplasm of prostate: Secondary | ICD-10-CM

## 2022-05-03 DIAGNOSIS — K760 Fatty (change of) liver, not elsewhere classified: Secondary | ICD-10-CM

## 2022-05-03 DIAGNOSIS — I10 Essential (primary) hypertension: Secondary | ICD-10-CM

## 2022-05-03 DIAGNOSIS — E782 Mixed hyperlipidemia: Secondary | ICD-10-CM

## 2022-05-03 DIAGNOSIS — F4310 Post-traumatic stress disorder, unspecified: Secondary | ICD-10-CM

## 2022-05-03 DIAGNOSIS — K2960 Other gastritis without bleeding: Secondary | ICD-10-CM

## 2022-05-03 DIAGNOSIS — E669 Obesity, unspecified: Secondary | ICD-10-CM

## 2022-05-03 DIAGNOSIS — J4540 Moderate persistent asthma, uncomplicated: Secondary | ICD-10-CM

## 2022-05-03 DIAGNOSIS — F5104 Psychophysiologic insomnia: Secondary | ICD-10-CM

## 2022-05-03 DIAGNOSIS — R7303 Prediabetes: Secondary | ICD-10-CM

## 2022-05-03 DIAGNOSIS — J4541 Moderate persistent asthma with (acute) exacerbation: Secondary | ICD-10-CM | POA: Insufficient documentation

## 2022-05-03 DIAGNOSIS — Z6835 Body mass index (BMI) 35.0-35.9, adult: Secondary | ICD-10-CM

## 2022-05-03 DIAGNOSIS — Z1211 Encounter for screening for malignant neoplasm of colon: Secondary | ICD-10-CM

## 2022-05-03 MED ORDER — OMEPRAZOLE 40 MG CAPSULE,DELAYED RELEASE
40.0000 mg | DELAYED_RELEASE_CAPSULE | Freq: Every day | ORAL | 1 refills | Status: DC
Start: 2022-05-03 — End: 2022-09-06

## 2022-05-03 MED ORDER — BUDESONIDE-FORMOTEROL HFA 160 MCG-4.5 MCG/ACTUATION AEROSOL INHALER
2.0000 | INHALATION_SPRAY | Freq: Two times a day (BID) | RESPIRATORY_TRACT | 5 refills | Status: DC
Start: 2022-05-03 — End: 2022-09-06

## 2022-05-03 NOTE — Nursing Note (Signed)
05/03/22 1632   Depression Screen   Little interest or pleasure in doing things. 0   Feeling down, depressed, or hopeless 0   PHQ 2 Total 0

## 2022-05-03 NOTE — Progress Notes (Signed)
Chief Complaint   Patient presents with    Follow Up     Hypertension, PTSD, hypertension, prediabetes, asthma, insomnia, reflux, hyperlipidemia    Medication Refill     Last office visit with the provider: 04/01/2021    SUBJECTIVE:  Steve Young is a 61 y.o. male,White. Patient is feeling well today.  He has no recent weight loss.  His last colonoscopy was in 2011 and prefers not to get colonoscopy at this time. He does not have the following symptoms today: fever, chills, cough, nasal congestion, sore throat, abdominal pain, headache, dizziness, eye pain or irritation, epistaxis, chest discomfort, stroke symptoms, shortness of breath, wheezing or palpitations. Side effects of medications: no current medication side effects.     Current Outpatient Medications   Medication Sig    albuterol sulfate (PROVENTIL OR VENTOLIN OR PROAIR) 90 mcg/actuation Inhalation oral inhaler inhale 1 to 2 puffs by mouth and INTO THE LUNGS every 6 hours if needed    amLODIPine (NORVASC) 10 mg Oral Tablet take 1 tablet by mouth once daily    aspirin (ECOTRIN) 81 mg Oral Tablet, Delayed Release (E.C.) take 1 tablet by mouth once daily    atorvastatin (LIPITOR) 80 mg Oral Tablet take 1 tablet by mouth at bedtime    Benzonatate (TESSALON) 200 mg Oral Capsule Take 1 Capsule (200 mg total) by mouth Three times a day as needed for Cough    cholecalciferol, vitamin D3, 125 mcg (5,000 unit) Oral Capsule take 1 capsule by mouth once daily    cyclobenzaprine (FLEXERIL) 10 mg Oral Tablet Take 1 Tablet (10 mg total) by mouth Every night as needed for Muscle spasms Indications: muscle spasm    diclofenac sodium (VOLTAREN) 1 % Gel by Apply Topically route Four times a day - before meals and bedtime    folic acid (FOLVITE) 1 mg Oral Tablet take 1 tablet by mouth once daily    gabapentin (NEURONTIN) 600 mg Oral Tablet take 1 tablet by mouth three times a day    Ibuprofen (MOTRIN) 800 mg Oral Tablet take 1 tablet by mouth three times a day if  needed for pain    loratadine (CLARITIN) 10 mg Oral Tablet Take 1 Tablet (10 mg total) by mouth Once a day    losartan (COZAAR) 100 mg Oral Tablet take 1 tablet by mouth once daily    lubiprostone (AMITIZA) 24 mcg Oral Capsule Take 1 Cap (24 mcg total) by mouth Twice daily    magnesium oxide (MAG-OX) 400 mg (241.3 mg magnesium) Oral Tablet Take 1 Tablet (400 mg total) by mouth    meclizine (ANTIVERT) 25 mg Oral Tablet Take 1 Tablet (25 mg total) by mouth Every 8 hours as needed for Dizziness or Nausea/Vomiting Indications: motion sickness, sensation of spinning or whirling    metFORMIN (GLUCOPHAGE) 500 mg Oral Tablet take 1 tablet by mouth every morning WITH BREAKFAST    omega-3 fatty acid (LOVAZA) 1 gram Oral Capsule take 2 capsules by mouth twice a day    omeprazole (PRILOSEC) 40 mg Oral Capsule, Delayed Release(E.C.) take 1 capsule by mouth once daily    ondansetron (ZOFRAN ODT) 4 mg Oral Tablet, Rapid Dissolve Take 1 Tablet (4 mg total) by mouth Every 8 hours as needed for Nausea/Vomiting    oxyCODONE-acetaminophen (PERCOCET) 10-325 mg Oral Tablet take 1 tablet by mouth three times a day if needed for pain    PARoxetine (PAXIL) 20 mg Oral Tablet take 1 tablet by mouth once daily  Sildenafil (VIAGRA) 25 mg Oral Tablet take 1 tablet by mouth every 24 hours if needed    SPIRIVA RESPIMAT 1.25 mcg/actuation Inhalation Mist inhale 1 puff by mouth and INTO THE LUNGS once daily    SYMBICORT 160-4.5 mcg/actuation Inhalation oral inhaler inhale 2 puffs by mouth twice a day Rinse mouth after use    triamterene-hydroCHLOROthiazide (MAXZIDE-25) 37.5-25 mg Oral Tablet take 1 tablet by mouth every morning    zafirlukast (ACCOLATE) 20 mg Oral Tablet take 1 tablet by mouth twice a day before meals      No Known Allergies    Past Surgical History:   Procedure Laterality Date    COLONOSCOPY      ELBOW SURGERY Left 12/2017    HX OTHER  2004     SPINAL FUSION,ANT,EA ADNL LEVEL c3-c7    HX OTHER Right     right great toe    HX  SHOULDER SURGERY Bilateral     NOSE SURGERY       Social History     Tobacco Use    Smoking status: Never    Smokeless tobacco: Never   Vaping Use    Vaping Use: Never used   Substance Use Topics    Alcohol use: Not Currently    Drug use: Never     OBJECTIVE:   The patient appears to be in no acute distress.  Vitals: BP 126/78 (Site: Left, Patient Position: Sitting, Cuff Size: Adult Large)   Pulse 74   Temp (!) 35.9 C (96.6 F) (Temporal)   Resp 18   Ht 1.753 m ('5\' 9"'$ )   Wt 108 kg (238 lb)   SpO2 97%   BMI 35.15 kg/m   General: alert, no acute distress  Head: normocephalic, atraumatic  Eye: EOM intact, normal conjunctiva  Respiratory: Good diaphragmatic excursion. Lungs clear.  Heart: RRR  Extremities: Ambulatory, no edema  Neurologic: Awake, alert, oriented, speech clear and coherent  Skin: No jaundice, warm   Psychiatric: Cooperative, normal judgment    ASSESSMENT AND PLAN:     ICD-10-CM    1. NAFL (nonalcoholic fatty liver)  Y18.5 COMPREHENSIVE METABOLIC PNL, FASTING     CBC     [place] RT Upper Quadrant  Recommended gradual weight loss.      2. PTSD (post-traumatic stress disorder)  F43.10 COMPREHENSIVE METABOLIC PNL, FASTING     CBC  Improved.  He may continue Paxil.      3. Essential hypertension  I10 COMPREHENSIVE METABOLIC PNL, FASTING     CBC     LIPID PANEL     MAGNESIUM  Blood pressure is at goal.  Continue hypertension medicines and lifestyle changes.      4. Prediabetes  R73.03 COMPREHENSIVE METABOLIC PNL, FASTING     HGA1C (HEMOGLOBIN A1C WITH EST AVG GLUCOSE)  Follow a healthy low sugar diet, continue metformin, aspirin, regular exercise as tolerated and gradual weight loss recommended.      5. Psychophysiological insomnia  F51.04 COMPREHENSIVE METABOLIC PNL, FASTING     Refer to [facility]  He still has problems with insomnia and used to be on Ambien.      6. Moderate persistent asthma without complication  U31.49 COMPREHENSIVE METABOLIC PNL, FASTING     budesonide-formoteroL (SYMBICORT)  160-4.5 mcg/actuation Inhalation oral inhaler  Stable to continue current inhalers.      7. Obesity (BMI 30.0-34.9)  E66.9 COMPREHENSIVE METABOLIC PNL, FASTING     LIPID PANEL  Recommended gradual weight loss.  8. Colon cancer screening  Z12.11 FECAL DNA TESTING (AMB)  Advised to get colonoscopy if the stool test is positive.      9. Screening PSA (prostate specific antigen)  Z12.5 PSA SCREENING  Has no urinary discomfort.      10. Mixed hyperlipidemia  E78.2 LIPID PANEL  Continue atorvastatin, Lovaza, healthy diet, exercise and weight loss.      11. Reflux gastritis  K29.60 omeprazole (PRILOSEC) 40 mg Oral Capsule, Delayed Release(E.C.)     [place] RT Upper Quadrant     MAGNESIUM  Better when taking omeprazole.         Depression screening is negative. PHQ 2 Total: 0     Data reviewed:   Recent Results (from the past [id number] hour(s))   [place] RT Upper Quadrant    Collection Time: 04/04/21  8:41 AM    Narrative    INDICATION:  RUQ pain x 1 year. Nonalcoholic fatty liver. Elevated LFT. Nausea/vomiting x 2 months. Additional History: HTN. DM. Hyperlipidemia. Acid reflux. Weight loss.    TECHNIQUE:    Ultrasound of the Right Upper Quadrant.    COMPARISON:  US Abdomen 10/03/2018, MR Abdomen 10/20/2018, CT A/P 10/13/2018.      FINDINGS:      The pancreas is not seen due to overlying bowel gas.      The liver is diffusely echogenic with poor acoustic penetration. There is a focal area of decreased echogenicity within the gallbladder fossa consistent with an area of focal fatty sparing. This measures 3.1 x 1.7 x 2.3 cm. There is no intrahepatic biliary dilatation.    The gallbladder is anechoic.  There are no stones.  Sonographic Murphy's sign is positive.  There is no pericholecystic fluid or wall thickening.  The common bile duct measures 0.5 cm.       The right kidney measures 11.0 cm in length.  Renal cortical echotexture is normal.  There is no hydronephrosis.  There are no stones.  There are no cysts.    Limited exam due to  overlying bowel gas and body habitus.      Impression    Diffuse hepatic steatosis with fatty sparing.  Otherwise unremarkable ultrasound of the right upper quadrant.  No cholelithiasis, gallbladder wall thickening, or biliary dilatation is appreciated.    The sonographic Murphys sign was reported as positive.  This is of questionable significance in the setting of an otherwise normal appearing gallbladder.    The right hepatic lesion visualized on prior ultrasound was not clearly visualized on today's exam. Otherwise, no significant change compared to prior ultrasound.     Lab Results   Component Value Date/Time    HA1C 5.8 03/09/2021 09:06 AM    CREATININE 1.29 04/04/2021 08:41 AM    GFR >60 04/04/2021 08:41 AM    CALCIUM 9.5 04/04/2021 08:41 AM    SODIUM 138 04/04/2021 08:41 AM    POTASSIUM 3.6 04/04/2021 08:41 AM    WBC 10.9 04/04/2021 08:41 AM    HGB 14.3 04/04/2021 08:41 AM    HCT 41.9 04/04/2021 08:41 AM    PLTCNT 265 04/04/2021 08:41 AM    AST 16 03/09/2021 09:06 AM    ALT 20 03/09/2021 09:06 AM    ALKPHOS 54 03/09/2021 09:06 AM    CHOLESTEROL 137 03/09/2021 09:06 AM    HDLCHOL 35 03/09/2021 09:06 AM    LDLCHOL 80 03/09/2021 09:06 AM    TRIG 109 03/09/2021 09:06 AM    TSH 0.562 06/11/2019 02:06 PM  MAGNESIUM 2.2 02/12/2020 12:50 PM     Orders Placed This Encounter    [place] RT Upper Quadrant    FECAL DNA TESTING (AMB)    COMPREHENSIVE METABOLIC PNL, FASTING    CBC    HGA1C (HEMOGLOBIN A1C WITH EST AVG GLUCOSE)    LIPID PANEL    PSA SCREENING    MAGNESIUM    Refer to [facility]    omeprazole (PRILOSEC) 40 mg Oral Capsule, Delayed Release(E.C.)    budesonide-formoteroL (SYMBICORT) 160-4.5 mcg/actuation Inhalation oral inhaler     Recheck blood work this month. Above plan was discussed with the patient and patient verbalized understanding. Patient agreed with above treatment and plan. Questions answered to patient's satisfaction. Risks, benefits, and alternatives to above treatment discussed with patient.    Follow-up in 4 months , sooner should new symptoms or problems arise.   He  was advised to contact the office if with any questions or concerns.    Jarome Lamas. Shona Simpson, MD  Note: This chart was transcribed using voice recognition software and may contain unintended word substitution or minor typographical errors.

## 2022-05-03 NOTE — Nursing Note (Signed)
Pt is here today for follow up. No concerns. Last colonoscopy was done in 2011.

## 2022-05-11 ENCOUNTER — Telehealth (HOSPITAL_COMMUNITY): Payer: Self-pay | Admitting: Student in an Organized Health Care Education/Training Program

## 2022-05-14 ENCOUNTER — Ambulatory Visit (INDEPENDENT_AMBULATORY_CARE_PROVIDER_SITE_OTHER): Payer: Self-pay | Admitting: Family Medicine

## 2022-05-14 ENCOUNTER — Telehealth (HOSPITAL_COMMUNITY): Payer: Self-pay | Admitting: Student in an Organized Health Care Education/Training Program

## 2022-05-14 ENCOUNTER — Other Ambulatory Visit: Payer: Self-pay

## 2022-05-14 ENCOUNTER — Other Ambulatory Visit: Payer: Medicare (Managed Care) | Attending: Family Medicine

## 2022-05-14 DIAGNOSIS — E669 Obesity, unspecified: Secondary | ICD-10-CM | POA: Insufficient documentation

## 2022-05-14 DIAGNOSIS — K2960 Other gastritis without bleeding: Secondary | ICD-10-CM | POA: Insufficient documentation

## 2022-05-14 DIAGNOSIS — K760 Fatty (change of) liver, not elsewhere classified: Secondary | ICD-10-CM | POA: Insufficient documentation

## 2022-05-14 DIAGNOSIS — R7303 Prediabetes: Secondary | ICD-10-CM | POA: Insufficient documentation

## 2022-05-14 DIAGNOSIS — Z125 Encounter for screening for malignant neoplasm of prostate: Secondary | ICD-10-CM | POA: Insufficient documentation

## 2022-05-14 DIAGNOSIS — E781 Pure hyperglyceridemia: Secondary | ICD-10-CM

## 2022-05-14 DIAGNOSIS — F5104 Psychophysiologic insomnia: Secondary | ICD-10-CM | POA: Insufficient documentation

## 2022-05-14 DIAGNOSIS — F4310 Post-traumatic stress disorder, unspecified: Secondary | ICD-10-CM | POA: Insufficient documentation

## 2022-05-14 DIAGNOSIS — J4540 Moderate persistent asthma, uncomplicated: Secondary | ICD-10-CM | POA: Insufficient documentation

## 2022-05-14 DIAGNOSIS — I10 Essential (primary) hypertension: Secondary | ICD-10-CM | POA: Insufficient documentation

## 2022-05-14 LAB — LIPID PANEL
CHOL/HDL RATIO: 5.6
CHOLESTEROL: 161 mg/dL (ref 100–200)
HDL CHOL: 29 mg/dL — ABNORMAL LOW (ref >=50)
LDL CALC: 94 mg/dL (ref <100)
NON-HDL: 132 mg/dL (ref <=190)
TRIGLYCERIDES: 225 mg/dL — ABNORMAL HIGH (ref <150)
VLDL CALC: 36 mg/dL — ABNORMAL HIGH (ref <30)

## 2022-05-14 LAB — COMPREHENSIVE METABOLIC PNL, FASTING
ALBUMIN: 3.7 g/dL (ref 3.4–4.8)
ALKALINE PHOSPHATASE: 67 U/L (ref 45–115)
ALT (SGPT): 20 U/L (ref 10–55)
ANION GAP: 10 mmol/L (ref 4–13)
AST (SGOT): 15 U/L (ref 8–45)
BILIRUBIN TOTAL: 0.8 mg/dL (ref 0.3–1.3)
BUN/CREA RATIO: 12 (ref 6–22)
BUN: 13 mg/dL (ref 8–25)
CALCIUM: 9 mg/dL (ref 8.6–10.3)
CHLORIDE: 103 mmol/L (ref 96–111)
CO2 TOTAL: 28 mmol/L (ref 23–31)
CREATININE: 1.12 mg/dL (ref 0.75–1.35)
ESTIMATED GFR - MALE: 74 mL/min/BSA (ref >=60)
GLUCOSE: 110 mg/dL — ABNORMAL HIGH (ref 70–99)
POTASSIUM: 3.1 mmol/L — ABNORMAL LOW (ref 3.5–5.1)
PROTEIN TOTAL: 6.8 g/dL (ref 6.0–8.0)
SODIUM: 141 mmol/L (ref 136–145)

## 2022-05-14 LAB — CBC
HCT: 40.5 % (ref 38.9–52.0)
HGB: 13.8 g/dL (ref 13.4–17.5)
MCH: 28.9 pg (ref 26.0–32.0)
MCHC: 34.1 g/dL (ref 31.0–35.5)
MCV: 84.9 fL (ref 78.0–100.0)
MPV: 11.2 fL (ref 8.7–12.5)
PLATELETS: 273 10*3/uL (ref 150–400)
RBC: 4.77 10*6/uL (ref 4.50–6.10)
RDW-CV: 13.2 % (ref 11.5–15.5)
WBC: 8.4 10*3/uL (ref 3.7–11.0)

## 2022-05-14 LAB — HGA1C (HEMOGLOBIN A1C WITH EST AVG GLUCOSE)
ESTIMATED AVERAGE GLUCOSE: 123 mg/dL
HEMOGLOBIN A1C: 5.9 % — ABNORMAL HIGH (ref <=5.7)

## 2022-05-14 LAB — PSA SCREENING: PSA: 1.87 ng/mL (ref <=4.00)

## 2022-05-14 LAB — MAGNESIUM: MAGNESIUM: 1.9 mg/dL (ref 1.8–2.6)

## 2022-05-14 NOTE — Telephone Encounter (Signed)
Office did not call pt nothing documented

## 2022-05-14 NOTE — Telephone Encounter (Signed)
Regarding: Talaman-Perez Pt // Returning Call  ----- Message from Ronni Rumble sent at 05/14/2022  3:54 PM EST -----  Wynonia Hazard, MD,    Pt returning call to clinic, please call back when you can.     Thanks,  Marinell Blight [place]

## 2022-05-15 ENCOUNTER — Encounter (HOSPITAL_BASED_OUTPATIENT_CLINIC_OR_DEPARTMENT_OTHER): Payer: Self-pay

## 2022-05-15 DIAGNOSIS — E781 Pure hyperglyceridemia: Secondary | ICD-10-CM

## 2022-05-15 HISTORY — DX: Pure hyperglyceridemia: E78.1

## 2022-05-18 ENCOUNTER — Other Ambulatory Visit (INDEPENDENT_AMBULATORY_CARE_PROVIDER_SITE_OTHER): Payer: Self-pay

## 2022-05-18 DIAGNOSIS — E876 Hypokalemia: Secondary | ICD-10-CM

## 2022-05-18 MED ORDER — POTASSIUM CHLORIDE ER 20 MEQ TABLET,EXTENDED RELEASE(PART/CRYST)
20.0000 meq | ORAL_TABLET | Freq: Every day | ORAL | 0 refills | Status: AC
Start: 2022-05-18 — End: 2022-05-23

## 2022-05-24 ENCOUNTER — Telehealth (HOSPITAL_COMMUNITY): Payer: Self-pay | Admitting: Student in an Organized Health Care Education/Training Program

## 2022-05-30 ENCOUNTER — Emergency Department (HOSPITAL_COMMUNITY): Payer: Medicare (Managed Care)

## 2022-05-30 ENCOUNTER — Emergency Department (HOSPITAL_COMMUNITY): Payer: Auto Insurance (includes no fault)

## 2022-05-30 ENCOUNTER — Emergency Department (EMERGENCY_DEPARTMENT_HOSPITAL): Payer: Medicare (Managed Care)

## 2022-05-30 ENCOUNTER — Encounter (HOSPITAL_COMMUNITY): Payer: Self-pay

## 2022-05-30 ENCOUNTER — Other Ambulatory Visit: Payer: Self-pay

## 2022-05-30 ENCOUNTER — Emergency Department
Admission: EM | Admit: 2022-05-30 | Discharge: 2022-05-30 | Disposition: A | Discharge status: Home / Self Care | Payer: Medicare (Managed Care) | Attending: Emergency Medicine | Admitting: Emergency Medicine

## 2022-05-30 DIAGNOSIS — Y92410 Unspecified street and highway as the place of occurrence of the external cause: Secondary | ICD-10-CM

## 2022-05-30 DIAGNOSIS — R519 Headache, unspecified: Secondary | ICD-10-CM | POA: Insufficient documentation

## 2022-05-30 DIAGNOSIS — M5459 Other low back pain: Secondary | ICD-10-CM

## 2022-05-30 DIAGNOSIS — M25551 Pain in right hip: Secondary | ICD-10-CM | POA: Insufficient documentation

## 2022-05-30 DIAGNOSIS — M47812 Spondylosis without myelopathy or radiculopathy, cervical region: Secondary | ICD-10-CM

## 2022-05-30 DIAGNOSIS — I440 Atrioventricular block, first degree: Secondary | ICD-10-CM | POA: Insufficient documentation

## 2022-05-30 DIAGNOSIS — M5134 Other intervertebral disc degeneration, thoracic region: Secondary | ICD-10-CM

## 2022-05-30 DIAGNOSIS — M25552 Pain in left hip: Secondary | ICD-10-CM | POA: Insufficient documentation

## 2022-05-30 DIAGNOSIS — M549 Dorsalgia, unspecified: Secondary | ICD-10-CM | POA: Insufficient documentation

## 2022-05-30 DIAGNOSIS — S0990XA Unspecified injury of head, initial encounter: Secondary | ICD-10-CM

## 2022-05-30 DIAGNOSIS — M542 Cervicalgia: Secondary | ICD-10-CM | POA: Insufficient documentation

## 2022-05-30 LAB — PT/INR
INR: 0.93 (ref 0.90–1.10)
PROTHROMBIN TIME: 9.8 seconds (ref 9.0–13.0)

## 2022-05-30 LAB — COMPREHENSIVE METABOLIC PANEL, NON-FASTING
ALBUMIN: 3.7 g/dL (ref 3.4–4.8)
ALKALINE PHOSPHATASE: 64 U/L (ref 45–115)
ALT (SGPT): 23 U/L (ref 10–55)
ANION GAP: 8 mmol/L (ref 4–13)
AST (SGOT): 15 U/L (ref 8–45)
BILIRUBIN TOTAL: 0.9 mg/dL (ref 0.3–1.3)
BUN/CREA RATIO: 13 (ref 6–22)
BUN: 13 mg/dL (ref 8–25)
CALCIUM: 9.3 mg/dL (ref 8.6–10.3)
CHLORIDE: 104 mmol/L (ref 96–111)
CO2 TOTAL: 28 mmol/L (ref 23–31)
CREATININE: 0.98 mg/dL (ref 0.75–1.35)
ESTIMATED GFR - MALE: 87 mL/min/BSA (ref >=60)
GLUCOSE: 107 mg/dL (ref 65–125)
POTASSIUM: 3.3 mmol/L — ABNORMAL LOW (ref 3.5–5.1)
PROTEIN TOTAL: 6.9 g/dL (ref 6.0–8.0)
SODIUM: 140 mmol/L (ref 136–145)

## 2022-05-30 LAB — CBC WITH DIFF
BASOPHIL #: <0.1 10*3/uL (ref <=0.20)
BASOPHIL %: 1 %
EOSINOPHIL #: 0.15 10*3/uL (ref <=0.50)
EOSINOPHIL %: 2 %
HCT: 40.8 % (ref 38.9–52.0)
HGB: 14 g/dL (ref 13.4–17.5)
IMMATURE GRANULOCYTE #: <0.1 10*3/uL (ref <0.10)
IMMATURE GRANULOCYTE %: 0 % (ref 0.0–1.0)
LYMPHOCYTE #: 1.99 10*3/uL (ref 1.00–4.80)
LYMPHOCYTE %: 21 %
MCH: 28.7 pg (ref 26.0–32.0)
MCHC: 34.3 g/dL (ref 31.0–35.5)
MCV: 83.6 fL (ref 78.0–100.0)
MONOCYTE #: 0.66 10*3/uL (ref 0.20–1.10)
MONOCYTE %: 7 %
MPV: 10.6 fL (ref 8.7–12.5)
NEUTROPHIL #: 6.66 10*3/uL (ref 1.50–7.70)
NEUTROPHIL %: 69 %
PLATELETS: 264 10*3/uL (ref 150–400)
RBC: 4.88 10*6/uL (ref 4.50–6.10)
RDW-CV: 13.3 % (ref 11.5–15.5)
WBC: 9.6 10*3/uL (ref 3.7–11.0)

## 2022-05-30 LAB — ECG 12 LEAD
Atrial Rate: 75 {beats}/min
Calculated P Axis: 32 degrees
Calculated R Axis: 6 degrees
Calculated T Axis: -7 degrees
PR Interval: 238 ms
QRS Duration: 88 ms
QT Interval: 388 ms
QTC Calculation: 433 ms
Ventricular rate: 75 {beats}/min

## 2022-05-30 LAB — PTT (PARTIAL THROMBOPLASTIN TIME): APTT: 24 seconds (ref 23.0–32.0)

## 2022-05-30 LAB — MAGNESIUM: MAGNESIUM: 2 mg/dL (ref 1.8–2.6)

## 2022-05-30 LAB — TROPONIN-I: TROPONIN-I HS: <2.7 ng/L (ref <=35.0)

## 2022-05-30 MED ORDER — MORPHINE 4 MG/ML INJECTION SYRINGE
4.0000 mg | INJECTION | INTRAMUSCULAR | Status: AC
Start: 2022-05-30 — End: 2022-05-30
  Administered 2022-05-30: 4 mg via INTRAVENOUS
  Filled 2022-05-30: qty 1

## 2022-05-30 MED ORDER — IOHEXOL 350 MG IODINE/ML INTRAVENOUS SOLUTION
100.0000 mL | INTRAVENOUS | Status: AC
Start: 2022-05-30 — End: 2022-05-30
  Administered 2022-05-30: 100 mL via INTRAVENOUS

## 2022-05-30 NOTE — ED Nurses Note (Signed)
Patient discharged home with family per DO orders.  AVS reviewed with patient/care giver.  A written copy of the AVS and discharge instructions was given to the patient/care giver.  Questions sufficiently answered as needed.  Patient/care giver encouraged to follow up with PCP as indicated.  In the event of an emergency, patient/care giver instructed to call 911 or go to the nearest emergency room.

## 2022-05-30 NOTE — Discharge Instructions (Addendum)
Please follow-up with your primary care physician as above.  Please return to the emergency room for any worsening symptoms.
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
## 2022-09-06 ENCOUNTER — Ambulatory Visit (INDEPENDENT_AMBULATORY_CARE_PROVIDER_SITE_OTHER): Payer: Medicare (Managed Care) | Admitting: Family Medicine

## 2022-09-06 ENCOUNTER — Encounter (INDEPENDENT_AMBULATORY_CARE_PROVIDER_SITE_OTHER): Payer: Self-pay | Admitting: Family Medicine

## 2022-09-06 ENCOUNTER — Other Ambulatory Visit: Payer: Self-pay

## 2022-09-06 VITALS — BP 162/80 | HR 87 | Temp 98.2°F | Resp 18 | Ht 69.0 in | Wt 228.0 lb

## 2022-09-06 DIAGNOSIS — I10 Essential (primary) hypertension: Secondary | ICD-10-CM

## 2022-09-06 DIAGNOSIS — R7303 Prediabetes: Secondary | ICD-10-CM

## 2022-09-06 DIAGNOSIS — E782 Mixed hyperlipidemia: Secondary | ICD-10-CM

## 2022-09-06 DIAGNOSIS — E876 Hypokalemia: Secondary | ICD-10-CM

## 2022-09-06 DIAGNOSIS — K2960 Other gastritis without bleeding: Secondary | ICD-10-CM

## 2022-09-06 DIAGNOSIS — J4540 Moderate persistent asthma, uncomplicated: Secondary | ICD-10-CM

## 2022-09-06 MED ORDER — OMEPRAZOLE 40 MG CAPSULE,DELAYED RELEASE
40.0000 mg | DELAYED_RELEASE_CAPSULE | Freq: Every day | ORAL | 1 refills | Status: DC
Start: 2022-09-06 — End: 2022-12-27

## 2022-09-06 MED ORDER — BUDESONIDE-FORMOTEROL HFA 160 MCG-4.5 MCG/ACTUATION AEROSOL INHALER
2.0000 | INHALATION_SPRAY | Freq: Two times a day (BID) | RESPIRATORY_TRACT | 5 refills | Status: DC
Start: 2022-09-06 — End: 2022-10-25

## 2022-09-06 MED ORDER — METFORMIN 500 MG TABLET
500.0000 mg | ORAL_TABLET | Freq: Every morning | ORAL | 1 refills | Status: DC
Start: 2022-09-06 — End: 2023-10-27
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
## 2022-09-10 NOTE — Addendum Note (Signed)
Addended by: Peter Minium on: 09/10/2022 05:19 PM     Modules accepted: Level of Service

## 2022-10-18 ENCOUNTER — Encounter (INDEPENDENT_AMBULATORY_CARE_PROVIDER_SITE_OTHER): Payer: Self-pay | Admitting: Family Medicine

## 2022-10-21 ENCOUNTER — Other Ambulatory Visit: Payer: Medicare (Managed Care) | Attending: Family Medicine

## 2022-10-21 ENCOUNTER — Other Ambulatory Visit: Payer: Self-pay

## 2022-10-21 DIAGNOSIS — E782 Mixed hyperlipidemia: Secondary | ICD-10-CM

## 2022-10-21 DIAGNOSIS — I10 Essential (primary) hypertension: Secondary | ICD-10-CM | POA: Insufficient documentation

## 2022-10-21 DIAGNOSIS — R7303 Prediabetes: Secondary | ICD-10-CM | POA: Insufficient documentation

## 2022-10-21 DIAGNOSIS — E876 Hypokalemia: Secondary | ICD-10-CM

## 2022-10-21 LAB — LIPID PANEL
CHOL/HDL RATIO: 4.7
CHOLESTEROL: 147 mg/dL (ref 100–200)
HDL CHOL: 31 mg/dL — ABNORMAL LOW (ref >=50)
LDL CALC: 86 mg/dL (ref <100)
NON-HDL: 116 mg/dL (ref <=190)
TRIGLYCERIDES: 175 mg/dL — ABNORMAL HIGH (ref <150)
VLDL CALC: 28 mg/dL (ref <30)

## 2022-10-21 LAB — HGA1C (HEMOGLOBIN A1C WITH EST AVG GLUCOSE)
ESTIMATED AVERAGE GLUCOSE: 117 mg/dL
HEMOGLOBIN A1C: 5.7 % (ref <=5.7)

## 2022-10-21 LAB — POTASSIUM: POTASSIUM: 3.7 mmol/L (ref 3.5–5.1)

## 2022-10-25 ENCOUNTER — Ambulatory Visit (INDEPENDENT_AMBULATORY_CARE_PROVIDER_SITE_OTHER): Payer: Medicare (Managed Care) | Admitting: Family Medicine

## 2022-10-25 ENCOUNTER — Other Ambulatory Visit: Payer: Self-pay

## 2022-10-25 ENCOUNTER — Encounter (INDEPENDENT_AMBULATORY_CARE_PROVIDER_SITE_OTHER): Payer: Self-pay | Admitting: Family Medicine

## 2022-10-25 VITALS — BP 132/82 | HR 64 | Temp 97.9°F | Resp 18 | Ht 69.02 in | Wt 226.4 lb

## 2022-10-25 DIAGNOSIS — I10 Essential (primary) hypertension: Secondary | ICD-10-CM

## 2022-10-25 DIAGNOSIS — R7303 Prediabetes: Secondary | ICD-10-CM

## 2022-10-25 DIAGNOSIS — Z6833 Body mass index (BMI) 33.0-33.9, adult: Secondary | ICD-10-CM

## 2022-10-25 DIAGNOSIS — E781 Pure hyperglyceridemia: Secondary | ICD-10-CM

## 2022-10-25 DIAGNOSIS — K760 Fatty (change of) liver, not elsewhere classified: Secondary | ICD-10-CM

## 2022-10-25 DIAGNOSIS — J4540 Moderate persistent asthma, uncomplicated: Secondary | ICD-10-CM

## 2022-10-25 DIAGNOSIS — I440 Atrioventricular block, first degree: Secondary | ICD-10-CM

## 2022-10-25 DIAGNOSIS — E669 Obesity, unspecified: Secondary | ICD-10-CM

## 2022-10-25 MED ORDER — WEGOVY 0.25 MG/0.5 ML SUBCUTANEOUS PEN INJECTOR
0.2500 mg | PEN_INJECTOR | SUBCUTANEOUS | 0 refills | Status: DC
Start: 2022-10-25 — End: 2022-12-27

## 2022-10-25 MED ORDER — MOMETASONE-FORMOTEROL HFA 200 MCG-5 MCG/ACTUATION AEROSOL INHALER - RN
2.0000 | INHALATION_SPRAY | Freq: Two times a day (BID) | RESPIRATORY_TRACT | 3 refills | Status: DC
Start: 2022-10-25 — End: 2023-08-02

## 2022-10-25 NOTE — Progress Notes (Addendum)
Chief Complaint   Patient presents with    Follow Up     Hypertension, hypertriglyceridemia, asthma, prediabetes, fatty liver    Results     Last office visit with the provider:09/06/2022     SUBJECTIVE:  Steve Young is a 62 y.o. male,White. Patient is getting shortness of breath from asthma and feels like the Symbicort is not helping. He does not have the following symptoms today: fever, chills, cough, nasal congestion, sore throat, abdominal pain, headache, dizziness, eye pain or irritation, epistaxis, chest discomfort, stroke symptoms, wheezing or palpitations. Side effects of medications: no current medication side effects.     Current Outpatient Medications   Medication Sig    albuterol sulfate (PROVENTIL OR VENTOLIN OR PROAIR) 90 mcg/actuation Inhalation oral inhaler inhale 1 to 2 puffs by mouth and INTO THE LUNGS every 6 hours if needed    amLODIPine (NORVASC) 10 mg Oral Tablet take 1 tablet by mouth once daily    aspirin (ECOTRIN) 81 mg Oral Tablet, Delayed Release (E.C.) take 1 tablet by mouth once daily    atorvastatin (LIPITOR) 80 mg Oral Tablet take 1 tablet by mouth at bedtime    budesonide-formoteroL (SYMBICORT) 160-4.5 mcg/actuation Inhalation oral inhaler Take 2 Puffs by inhalation Twice daily Indications: asthma attack, controller medication for asthma    cholecalciferol, vitamin D3, 125 mcg (5,000 unit) Oral Capsule take 1 capsule by mouth once daily    cyclobenzaprine (FLEXERIL) 10 mg Oral Tablet Take 1 Tablet (10 mg total) by mouth Every night as needed for Muscle spasms Indications: muscle spasm    diclofenac sodium (VOLTAREN) 1 % Gel by Apply Topically route Four times a day - before meals and bedtime    folic acid (FOLVITE) 1 mg Oral Tablet take 1 tablet by mouth once daily    gabapentin (NEURONTIN) 600 mg Oral Tablet take 1 tablet by mouth three times a day    Ibuprofen (MOTRIN) 800 mg Oral Tablet take 1 tablet by mouth three times a day if needed for pain    losartan (COZAAR) 100 mg  Oral Tablet take 1 tablet by mouth once daily    lubiprostone (AMITIZA) 24 mcg Oral Capsule Take 1 Cap (24 mcg total) by mouth Twice daily    magnesium oxide (MAG-OX) 400 mg (241.3 mg magnesium) Oral Tablet Take 1 Tablet (400 mg total) by mouth    metFORMIN (GLUCOPHAGE) 500 mg Oral Tablet Take 1 Tablet (500 mg total) by mouth Every morning before breakfast Indications: prevention of type 2 diabetes mellitus    naloxone (NARCAN) 4 mg per spray nasal spray for accidental overdose. use 1 spray in a nostril. may repeat in alternate nostril in 2 to 3 minutes until person is responsive or EMS arriv    omega-3 fatty acid (LOVAZA) 1 gram Oral Capsule take 2 capsules by mouth twice a day    omeprazole (PRILOSEC) 40 mg Oral Capsule, Delayed Release(E.C.) Take 1 Capsule (40 mg total) by mouth Once a day Indications: gastroesophageal reflux disease, heartburn    ondansetron (ZOFRAN ODT) 4 mg Oral Tablet, Rapid Dissolve Take 1 Tablet (4 mg total) by mouth Every 8 hours as needed for Nausea/Vomiting    oxyCODONE-acetaminophen (PERCOCET) 10-325 mg Oral Tablet take 1 tablet by mouth three times a day if needed for pain    PARoxetine (PAXIL) 20 mg Oral Tablet take 1 tablet by mouth once daily    Sildenafil (VIAGRA) 25 mg Oral Tablet take 1 tablet by mouth every 24 hours if needed  SPIRIVA RESPIMAT 1.25 mcg/actuation Inhalation Mist inhale 1 puff by mouth and INTO THE LUNGS once daily    triamterene-hydroCHLOROthiazide (MAXZIDE-25) 37.5-25 mg Oral Tablet take 1 tablet by mouth every morning    zafirlukast (ACCOLATE) 20 mg Oral Tablet take 1 tablet by mouth twice a day before meals      No Known Allergies    Past Surgical History:   Procedure Laterality Date    COLONOSCOPY      ELBOW SURGERY Left 12/2017    HX OTHER  2004     SPINAL FUSION,ANT,EA ADNL LEVEL c3-c7    HX OTHER Right     right great toe    HX SHOULDER SURGERY Bilateral     NOSE SURGERY       Social History     Tobacco Use    Smoking status: Never    Smokeless tobacco:  Never   Vaping Use    Vaping status: Never Used   Substance Use Topics    Alcohol use: Not Currently    Drug use: Never     OBJECTIVE:   The patient appears to be in no acute distress.  Vitals: BP 132/82 (Site: Left, Patient Position: Sitting, Cuff Size: Adult Large)   Pulse 64   Temp 36.6 C (97.9 F)   Resp 18   Ht 1.753 m (5' 9.02")   Wt 103 kg (226 lb 6.4 oz)   SpO2 97%   BMI 33.42 kg/m   General: alert, no acute distress  Head: normocephalic, atraumatic  Eye: EOM intact, normal conjunctiva  Respiratory: Good diaphragmatic excursion. Lungs clear.  Heart: RRR  Extremities: Ambulatory, no edema  Neurologic: Awake, alert, oriented, speech clear and coherent  Skin: No jaundice, warm   Psychiatric: Cooperative, normal affect     ASSESSMENT AND PLAN:     ICD-10-CM    1. Essential hypertension  I10 TRANSTHORACIC ECHOCARDIOGRAM - ADULT  Improving.  Continue hypertension medicines and lifestyle changes.      2. Hypertriglyceridemia  E78.1 Improving.  Continue atorvastatin and lifestyle changes.      3. Prediabetes  R73.03 Improving.  Continue metformin, healthy diet, exercise and gradual weight loss recommended.      4. Moderate persistent asthma without complication  J45.40 TRANSTHORACIC ECHOCARDIOGRAM - ADULT     mometasone-formoterol (DULERA) 200-5 mcg/dose Inhalation HFA Aerosol Inhaler - new  He may stop Symbicort and change to [employer].  Continue albuterol inhaler as needed.     Pulmonary Function Testing - Adult      5. AV block, 1st degree  I44.0 TRANSTHORACIC ECHOCARDIOGRAM - ADULT      6. Metabolic dysfunction-associated steatotic liver disease (MASLD)  K76.0 [place] RT Upper Quadrant  Recommended gradual weight loss.  He may remain on aspirin.     semaglutide, weight loss, (WEGOVY) 0.25 mg/0.5 mL Subcutaneous Pen Injector      7. Obesity (BMI 30.0-34.9)  E66.9 semaglutide, weight loss, (WEGOVY) 0.25 mg/0.5 mL Subcutaneous Pen Injector - new  Not much better with diet and exercise.Added semaglutide as adjunct  to healthy reduced calorie diet with plenty of fruits, vegetables, whole grains, lean proteins and healthy fats. Recommended regular exercise to help lose weight.  Medicine will be titrated to next dose monthly up to 2.4 mg weekly if tolerated.  Patient has no personal or family history of having medullary thyroid cancer or MEN. Target weight loss is 1 lb a week. Plan on using for 68 weeks or less depending on weight loss progress.  Importance of medication adherence discussed.  Advised patient to fill medications regularly.  Current statin:   Active Statin Meds   Medication Sig    atorvastatin (LIPITOR) 80 mg Oral Tablet take 1 tablet by mouth at bedtime     Importance of medication adherence discussed.  Advised patient to fill medications regularly.  Current ACE or ARB:   Active ACE/ARB Meds   Medication Sig    losartan (COZAAR) 100 mg Oral Tablet take 1 tablet by mouth once daily     Data reviewed:   PROSTATE SPECIFIC ANTIGEN   Lab Results   Component Value Date/Time    PROSSPECAG 1.87 05/14/2022 06:23 AM        Lab Results   Component Value Date/Time    HA1C 5.7 10/21/2022 01:30 PM    BUN 13 05/30/2022 06:20 PM    CREATININE 0.98 05/30/2022 06:20 PM    GFR 87 05/30/2022 06:20 PM    GLUCOSE 107 05/30/2022 06:20 PM    CALCIUM 9.3 05/30/2022 06:20 PM    SODIUM 140 05/30/2022 06:20 PM    POTASSIUM 3.7 10/21/2022 01:30 PM    WBC 9.6 05/30/2022 06:20 PM    HGB 14.0 05/30/2022 06:20 PM    HCT 40.8 05/30/2022 06:20 PM    PLTCNT 264 05/30/2022 06:20 PM    AST 15 05/30/2022 06:20 PM    ALT 23 05/30/2022 06:20 PM    ALKPHOS 64 05/30/2022 06:20 PM    CHOLESTEROL 147 10/21/2022 01:30 PM    HDLCHOL 31 (L) 10/21/2022 01:30 PM    LDLCHOL 86 10/21/2022 01:30 PM    TRIG 175 (H) 10/21/2022 01:30 PM    TSH 0.562 06/11/2019 02:06 PM    MAGNESIUM 2.0 05/30/2022 06:20 PM     Orders Placed This Encounter    [place] RT Upper Quadrant    TRANSTHORACIC ECHOCARDIOGRAM - ADULT    Pulmonary Function Testing - Adult    mometasone-formoterol  (DULERA) 200-5 mcg/dose Inhalation HFA Aerosol Inhaler    semaglutide, weight loss, (WEGOVY) 0.25 mg/0.5 mL Subcutaneous Pen Injector     Above plan was discussed with the patient and patient verbalized understanding. Patient agreed with above treatment and plan. Questions answered to patient's satisfaction. Risks, benefits, and alternatives to above treatment discussed with patient.   Follow-up in 2 months, sooner should new symptoms or problems arise.   He  was advised to contact the office if with any questions or concerns.    Manley Mason. Norberto Sorenson, MD  Note: This chart was transcribed using voice recognition software and may contain unintended word substitution or minor typographical errors.

## 2022-10-25 NOTE — Nursing Note (Signed)
6 week follow up

## 2022-11-26 ENCOUNTER — Ambulatory Visit (INDEPENDENT_AMBULATORY_CARE_PROVIDER_SITE_OTHER): Payer: Self-pay | Admitting: Family Medicine

## 2022-11-26 NOTE — Telephone Encounter (Signed)
PA wegovy submitted to [facility]  Key: [id number]

## 2022-12-09 ENCOUNTER — Other Ambulatory Visit: Payer: Self-pay

## 2022-12-09 ENCOUNTER — Inpatient Hospital Stay (HOSPITAL_COMMUNITY)
Admission: RE | Admit: 2022-12-09 | Discharge: 2022-12-09 | Disposition: A | Discharge status: Home / Self Care | Payer: Medicare (Managed Care) | Source: Ambulatory Visit | Attending: Family Medicine | Admitting: Family Medicine

## 2022-12-09 ENCOUNTER — Inpatient Hospital Stay
Admission: RE | Admit: 2022-12-09 | Discharge: 2022-12-09 | Disposition: A | Discharge status: Home / Self Care | Payer: Medicare (Managed Care) | Source: Ambulatory Visit | Attending: Family Medicine | Admitting: Family Medicine

## 2022-12-09 DIAGNOSIS — K760 Fatty (change of) liver, not elsewhere classified: Secondary | ICD-10-CM | POA: Insufficient documentation

## 2022-12-09 DIAGNOSIS — J4540 Moderate persistent asthma, uncomplicated: Secondary | ICD-10-CM | POA: Insufficient documentation

## 2022-12-10 ENCOUNTER — Encounter (INDEPENDENT_AMBULATORY_CARE_PROVIDER_SITE_OTHER): Payer: Self-pay | Admitting: Family Medicine

## 2022-12-13 ENCOUNTER — Encounter (HOSPITAL_COMMUNITY): Payer: Self-pay

## 2022-12-15 ENCOUNTER — Telehealth (INDEPENDENT_AMBULATORY_CARE_PROVIDER_SITE_OTHER): Payer: Self-pay | Admitting: Family Medicine

## 2022-12-15 DIAGNOSIS — J4540 Moderate persistent asthma, uncomplicated: Secondary | ICD-10-CM

## 2022-12-15 NOTE — Telephone Encounter (Signed)
Ordered.  Thank you.

## 2022-12-15 NOTE — Telephone Encounter (Signed)
-----   Message from Steve Young sent at 12/14/2022  5:01 PM EDT -----  Regarding: pft  Hello,   patient arrived for his testing  but it was not completed - he missed his appt time.   Asking for a new PFT order to be placed for the patient, he called in to reschedule the test.   Thanks so much

## 2022-12-16 DIAGNOSIS — U071 COVID-19: Secondary | ICD-10-CM

## 2022-12-16 HISTORY — DX: COVID-19: U07.1

## 2022-12-24 ENCOUNTER — Ambulatory Visit (HOSPITAL_COMMUNITY): Payer: Self-pay

## 2022-12-27 ENCOUNTER — Ambulatory Visit (INDEPENDENT_AMBULATORY_CARE_PROVIDER_SITE_OTHER): Payer: Medicare (Managed Care) | Admitting: Family Medicine

## 2022-12-27 ENCOUNTER — Encounter (INDEPENDENT_AMBULATORY_CARE_PROVIDER_SITE_OTHER): Payer: Self-pay | Admitting: Family Medicine

## 2022-12-27 ENCOUNTER — Other Ambulatory Visit: Payer: Self-pay

## 2022-12-27 VITALS — BP 108/70 | HR 98 | Temp 97.0°F | Resp 18 | Ht 69.02 in | Wt 220.4 lb

## 2022-12-27 DIAGNOSIS — I440 Atrioventricular block, first degree: Secondary | ICD-10-CM

## 2022-12-27 DIAGNOSIS — J4540 Moderate persistent asthma, uncomplicated: Secondary | ICD-10-CM

## 2022-12-27 DIAGNOSIS — M792 Neuralgia and neuritis, unspecified: Secondary | ICD-10-CM

## 2022-12-27 DIAGNOSIS — M5030 Other cervical disc degeneration, unspecified cervical region: Secondary | ICD-10-CM

## 2022-12-27 DIAGNOSIS — E669 Obesity, unspecified: Secondary | ICD-10-CM

## 2022-12-27 DIAGNOSIS — K2960 Other gastritis without bleeding: Secondary | ICD-10-CM

## 2022-12-27 DIAGNOSIS — R7303 Prediabetes: Secondary | ICD-10-CM

## 2022-12-27 DIAGNOSIS — Z23 Encounter for immunization: Secondary | ICD-10-CM

## 2022-12-27 DIAGNOSIS — E781 Pure hyperglyceridemia: Secondary | ICD-10-CM

## 2022-12-27 DIAGNOSIS — Z7902 Long term (current) use of antithrombotics/antiplatelets: Secondary | ICD-10-CM

## 2022-12-27 DIAGNOSIS — Z7982 Long term (current) use of aspirin: Secondary | ICD-10-CM

## 2022-12-27 DIAGNOSIS — R9431 Abnormal electrocardiogram [ECG] [EKG]: Secondary | ICD-10-CM

## 2022-12-27 DIAGNOSIS — M170 Bilateral primary osteoarthritis of knee: Secondary | ICD-10-CM

## 2022-12-27 DIAGNOSIS — Z6832 Body mass index (BMI) 32.0-32.9, adult: Secondary | ICD-10-CM

## 2022-12-27 DIAGNOSIS — K760 Fatty (change of) liver, not elsewhere classified: Secondary | ICD-10-CM

## 2022-12-27 DIAGNOSIS — Z7985 Long-term (current) use of injectable non-insulin antidiabetic drugs: Secondary | ICD-10-CM

## 2022-12-27 DIAGNOSIS — I10 Essential (primary) hypertension: Secondary | ICD-10-CM

## 2022-12-27 MED ORDER — IBUPROFEN 800 MG TABLET
ORAL_TABLET | ORAL | 3 refills | Status: DC
Start: 2022-12-27 — End: 2023-04-29

## 2022-12-27 MED ORDER — OMEPRAZOLE 40 MG CAPSULE,DELAYED RELEASE
40.0000 mg | DELAYED_RELEASE_CAPSULE | Freq: Every day | ORAL | 1 refills | Status: DC
Start: 2022-12-27 — End: 2023-06-21

## 2022-12-27 MED ORDER — GABAPENTIN 600 MG TABLET
600.0000 mg | ORAL_TABLET | Freq: Three times a day (TID) | ORAL | 3 refills | Status: DC
Start: 2022-12-27 — End: 2023-02-07

## 2022-12-27 MED ORDER — SEMAGLUTIDE 0.25 MG OR 0.5 MG (2 MG/3 ML) SUBCUTANEOUS PEN INJECTOR
PEN_INJECTOR | SUBCUTANEOUS | 0 refills | Status: DC
Start: 2022-12-27 — End: 2023-01-31

## 2022-12-27 MED ORDER — DIPHTH,PERTUS(AC)TETANUS(PF)2 LF-(2.5-5-3-5MCG)-5 LF/0.5 ML IM SYRINGE
0.5000 mL | INJECTION | Freq: Once | INTRAMUSCULAR | 0 refills | Status: DC
Start: 2022-12-27 — End: 2023-01-10

## 2022-12-27 NOTE — Progress Notes (Signed)
Chief Complaint   Patient presents with    Follow Up     Fatty liver, hypertension, prediabetes, neuropathy, hypertriglyceridemia, asthma, reflux    Medication Refill     Gabapentin,Omeprazole, and Ibuprofen.      Last office visit with the provider:10/25/2022     SUBJECTIVE:  Steve Young is a 62 y.o. male,White. Patient is feeling well today. He does not have the following symptoms today: fever, chills, cough, nasal congestion, sore throat, abdominal pain, headache, dizziness, eye pain or irritation, epistaxis, chest discomfort, stroke symptoms, shortness of breath, dyspnea on exertion or palpitations. Side effects of medications: no current medication side effects.     Current Outpatient Medications   Medication Sig    albuterol sulfate (PROVENTIL OR VENTOLIN OR PROAIR) 90 mcg/actuation Inhalation oral inhaler inhale 1 to 2 puffs by mouth and INTO THE LUNGS every 6 hours if needed    amLODIPine (NORVASC) 10 mg Oral Tablet take 1 tablet by mouth once daily    aspirin (ECOTRIN) 81 mg Oral Tablet, Delayed Release (E.C.) take 1 tablet by mouth once daily    atorvastatin (LIPITOR) 80 mg Oral Tablet take 1 tablet by mouth at bedtime    cholecalciferol, vitamin D3, 125 mcg (5,000 unit) Oral Capsule take 1 capsule by mouth once daily    cyclobenzaprine (FLEXERIL) 10 mg Oral Tablet Take 1 Tablet (10 mg total) by mouth Every night as needed for Muscle spasms Indications: muscle spasm    diclofenac sodium (VOLTAREN) 1 % Gel by Apply Topically route Four times a day - before meals and bedtime    folic acid (FOLVITE) 1 mg Oral Tablet take 1 tablet by mouth once daily    gabapentin (NEURONTIN) 600 mg Oral Tablet take 1 tablet by mouth three times a day    Ibuprofen (MOTRIN) 800 mg Oral Tablet take 1 tablet by mouth three times a day if needed for pain    losartan (COZAAR) 100 mg Oral Tablet take 1 tablet by mouth once daily    lubiprostone (AMITIZA) 24 mcg Oral Capsule Take 1 Cap (24 mcg total) by mouth Twice daily     magnesium oxide (MAG-OX) 400 mg (241.3 mg magnesium) Oral Tablet Take 1 Tablet (400 mg total) by mouth    metFORMIN (GLUCOPHAGE) 500 mg Oral Tablet Take 1 Tablet (500 mg total) by mouth Every morning before breakfast Indications: prevention of type 2 diabetes mellitus    mometasone-formoterol (DULERA) 200-5 mcg/dose Inhalation HFA Aerosol Inhaler Take 2 Puffs by inhalation Twice daily    naloxone (NARCAN) 4 mg per spray nasal spray for accidental overdose. use 1 spray in a nostril. may repeat in alternate nostril in 2 to 3 minutes until person is responsive or EMS arriv    omega-3 fatty acid (LOVAZA) 1 gram Oral Capsule take 2 capsules by mouth twice a day    omeprazole (PRILOSEC) 40 mg Oral Capsule, Delayed Release(E.C.) Take 1 Capsule (40 mg total) by mouth Once a day Indications: gastroesophageal reflux disease, heartburn    ondansetron (ZOFRAN ODT) 4 mg Oral Tablet, Rapid Dissolve Take 1 Tablet (4 mg total) by mouth Every 8 hours as needed for Nausea/Vomiting    oxyCODONE-acetaminophen (PERCOCET) 10-325 mg Oral Tablet take 1 tablet by mouth three times a day if needed for pain    PARoxetine (PAXIL) 20 mg Oral Tablet take 1 tablet by mouth once daily    Sildenafil (VIAGRA) 25 mg Oral Tablet take 1 tablet by mouth every 24 hours if needed  SPIRIVA RESPIMAT 1.25 mcg/actuation Inhalation Mist inhale 1 puff by mouth and INTO THE LUNGS once daily    triamterene-hydroCHLOROthiazide (MAXZIDE-25) 37.5-25 mg Oral Tablet take 1 tablet by mouth every morning    zafirlukast (ACCOLATE) 20 mg Oral Tablet take 1 tablet by mouth twice a day before meals      No Known Allergies    Past Surgical History:   Procedure Laterality Date    COLONOSCOPY      ELBOW SURGERY Left 12/2017    HX OTHER  2004     SPINAL FUSION,ANT,EA ADNL LEVEL c3-c7    HX OTHER Right     right great toe    HX SHOULDER SURGERY Bilateral     NOSE SURGERY       Social History     Tobacco Use    Smoking status: Never    Smokeless tobacco: Never   Vaping Use     Vaping status: Never Used   Substance Use Topics    Alcohol use: Not Currently    Drug use: Never     OBJECTIVE:   The patient appears to be in no acute distress.  Vitals: BP 108/70 (Site: Left Arm, Patient Position: Sitting, Cuff Size: Adult Large)   Pulse 98   Temp 36.1 C (97 F) (Temporal)   Resp 18   Ht 1.753 m (5' 9.02")   Wt 100 kg (220 lb 6.4 oz)   SpO2 97%   BMI 32.53 kg/m   General: alert, no acute distress  Head: normocephalic, atraumatic  Eye: EOM intact, normal conjunctiva  Respiratory: Good diaphragmatic excursion. Lungs clear.  Heart: RRR  Extremities: Ambulatory, no edema  Neurologic: Awake, alert, oriented, speech clear and coherent  Skin: No jaundice, warm   Psychiatric: Cooperative, normal affect     ASSESSMENT AND PLAN:     ICD-10-CM    1. Metabolic dysfunction-associated steatotic liver disease (MASLD)  K76.0 semaglutide (OZEMPIC) 0.25 mg or 0.5 mg (2 mg/3 mL) Subcutaneous Pen Injector     COMPREHENSIVE METABOLIC PNL, FASTING  Recommended gradual weight loss.  He may remain on aspirin.      2. Prediabetes  R73.03 semaglutide (OZEMPIC) 0.25 mg or 0.5 mg (2 mg/3 mL) Subcutaneous Pen Injector     COMPREHENSIVE METABOLIC PNL, FASTING he   Continue lifestyle changes and metformin.      3. Obesity (BMI 30.0-34.9)  E66.9 semaglutide (OZEMPIC) 0.25 mg or 0.5 mg (2 mg/3 mL) Subcutaneous Pen Injector - new  Added semaglutide as adjunct to healthy reduced calorie diet with plenty of fruits, vegetables, whole grains, lean proteins and healthy fats. Recommended regular exercise to help lose weight.  Medicine will be titrated to next dose monthly up to 2 mg weekly if tolerated.  Patient has no personal or family history of having medullary thyroid cancer or MEN. Target weight loss is 1 lb a week. Plan on using for 68 weeks or less depending on weight loss progress. Added medicine for cardiovascular event risk reduction in obesity.     COMPREHENSIVE METABOLIC PNL, FASTING      4. Essential hypertension   I10 COMPREHENSIVE METABOLIC PNL, FASTING     TRANSTHORACIC ECHOCARDIOGRAM - ADULT  Stable. Continue hypertension medicines and lifestyle changes.      5. Neuropathic pain  M79.2 gabapentin (NEURONTIN) 600 mg Oral Tablet     COMPREHENSIVE METABOLIC PNL, FASTING  Stable on gabapentin.      6. DDD (degenerative disc disease), cervical  M50.30 gabapentin (NEURONTIN) 600 mg Oral  Tablet     Ibuprofen (MOTRIN) 800 mg Oral Tablet     COMPREHENSIVE METABOLIC PNL, FASTING  Continue pain management.      7. Primary osteoarthritis of both knees  M17.0 Ibuprofen (MOTRIN) 800 mg Oral Tablet     COMPREHENSIVE METABOLIC PNL, FASTING  Recommended gradual weight loss and continue to exercise as tolerated.  He gets relief from taking ibuprofen as needed.      8. Reflux gastritis  K29.60 omeprazole (PRILOSEC) 40 mg Oral Capsule, Delayed Release(E.C.)     COMPREHENSIVE METABOLIC PNL, FASTING  Better when taking omeprazole.      9. Need for Tdap vaccination  Z23 Diph,Pertuss,Acell,,Tet Vac-PF (ADACEL) 2 Lf-(2.5-5-3-5 mcg)-5Lf/0.5 mL IntraMUSCULAR Syringe  He can get the vaccine at pharmacy.      10. Hypertriglyceridemia  E78.1 TRIGLYCERIDE     COMPREHENSIVE METABOLIC PNL, FASTING  Continue atorvastatin, Lovaza and lifestyle changes.      11. Moderate persistent asthma without complication  J45.40 TRANSTHORACIC ECHOCARDIOGRAM - ADULT  Stable. Continue current inhalers and Accolate.      12. AV block, 1st degree  I44.0 TRANSTHORACIC ECHOCARDIOGRAM - ADULT      13. Abnormal ECG  R94.31 TRANSTHORACIC ECHOCARDIOGRAM - ADULT        Data reviewed:   Recent Results (from the past [id number] hour(s))   [place] RT Upper Quadrant    Collection Time: 12/09/22  4:36 PM    Narrative    INDICATION:  Metabolic dysfunction-associated steatotic liver disease    TECHNIQUE:  Ultrasound of the Right Upper Quadrant.    COMPARISON:  CT chest and A&P 05/30/2022, [place] RUQ 04/04/2021, abdomen 10/03/2018    FINDINGS:      The pancreas is not well seen due to overlying  bowel gas.      The liver demonstrates increased echogenicity.  There is a focal area of decreased echogenicity within the gallbladder fossa consistent with an area of focal fatty sparing.  There is no intrahepatic biliary dilatation.      The gallbladder is anechoic.  There are no stones.  There is no pericholecystic fluid or wall thickening.  The common bile duct measures 0.8 cm.  The sonographic Murphy's sign is negative.    The right kidney measures 11.4 cm in length.  Renal cortical echotexture is normal.  There is no hydronephrosis.  There are no stones.  There are no cysts.       Impression    Diffuse hepatic steatosis.    No cholelithiasis, gallbladder wall thickening, or biliary dilatation is appreciated.     No significant change from prior ultrasound dated 04/04/2021.     Lab Results   Component Value Date/Time    HA1C 5.7 10/21/2022 01:30 PM    BUN 13 05/30/2022 06:20 PM    CREATININE 0.98 05/30/2022 06:20 PM    GFR 87 05/30/2022 06:20 PM    GLUCOSE 107 05/30/2022 06:20 PM    CALCIUM 9.3 05/30/2022 06:20 PM    SODIUM 140 05/30/2022 06:20 PM    POTASSIUM 3.7 10/21/2022 01:30 PM    WBC 9.6 05/30/2022 06:20 PM    HGB 14.0 05/30/2022 06:20 PM    HCT 40.8 05/30/2022 06:20 PM    PLTCNT 264 05/30/2022 06:20 PM    AST 15 05/30/2022 06:20 PM    ALT 23 05/30/2022 06:20 PM    ALKPHOS 64 05/30/2022 06:20 PM    CHOLESTEROL 147 10/21/2022 01:30 PM    HDLCHOL 31 (L) 10/21/2022 01:30 PM  LDLCHOL 86 10/21/2022 01:30 PM    TRIG 175 (H) 10/21/2022 01:30 PM    TSH 0.562 06/11/2019 02:06 PM    MAGNESIUM 2.0 05/30/2022 06:20 PM     Recent Results (from the past [id number] hour(s))   ECG 12 LEAD    Collection Time: 05/30/22  5:59 PM   Result Value    Ventricular rate 75    Atrial Rate 75    PR Interval 238    QRS Duration 88    QT Interval 388    QTC Calculation 433    Calculated P Axis 32    Calculated R Axis 6    Calculated T Axis -7    Narrative    Sinus rhythm with 1st degree AV block  Otherwise normal ECG  When compared  with ECG of 08-Jun-2021 06:31,  Inverted T waves have replaced nonspecific T wave abnormality in Inferior leads     PROSTATE SPECIFIC ANTIGEN   Lab Results   Component Value Date/Time    PROSSPECAG 1.87 05/14/2022 06:23 AM        Orders Placed This Encounter    TRIGLYCERIDE    COMPREHENSIVE METABOLIC PNL, FASTING    TRANSTHORACIC ECHOCARDIOGRAM - ADULT    semaglutide (OZEMPIC) 0.25 mg or 0.5 mg (2 mg/3 mL) Subcutaneous Pen Injector    gabapentin (NEURONTIN) 600 mg Oral Tablet    Ibuprofen (MOTRIN) 800 mg Oral Tablet    omeprazole (PRILOSEC) 40 mg Oral Capsule, Delayed Release(E.C.)    Diph,Pertuss,Acell,,Tet Vac-PF (ADACEL) 2 Lf-(2.5-5-3-5 mcg)-5Lf/0.5 mL IntraMUSCULAR Syringe     Recheck blood work before next office visit. Above plan was discussed with the patient and patient verbalized understanding. Patient agreed with above treatment and plan. Questions answered to patient's satisfaction. Risks, benefits, and alternatives to above treatment discussed with patient.   Follow-up in 6 weeks, sooner should new symptoms or problems arise.   He  was advised to contact the office if with any questions or concerns.    Manley Mason. Norberto Sorenson, MD  Note: This chart was transcribed using voice recognition software and may contain unintended word substitution or minor typographical errors.

## 2022-12-27 NOTE — Nursing Note (Signed)
Pt is here today for 2 month follow up. No concerns. Needs refill of Gabapentin,Omeprazole, and Ibuprofen.

## 2022-12-29 ENCOUNTER — Ambulatory Visit (INDEPENDENT_AMBULATORY_CARE_PROVIDER_SITE_OTHER): Payer: Medicare (Managed Care)

## 2022-12-29 ENCOUNTER — Other Ambulatory Visit: Payer: Self-pay

## 2022-12-29 DIAGNOSIS — E669 Obesity, unspecified: Secondary | ICD-10-CM

## 2022-12-29 DIAGNOSIS — R7303 Prediabetes: Secondary | ICD-10-CM

## 2022-12-29 NOTE — Progress Notes (Signed)
Pt presents to office with questions on how to administer first dose of Ozempic. RN provided education and demonstration. Pt self administered medication. Tolerated well.

## 2023-01-03 ENCOUNTER — Other Ambulatory Visit: Payer: Self-pay

## 2023-01-03 ENCOUNTER — Inpatient Hospital Stay
Admission: RE | Admit: 2023-01-03 | Discharge: 2023-01-03 | Disposition: A | Discharge status: Home / Self Care | Payer: Medicare (Managed Care) | Source: Ambulatory Visit | Attending: Family Medicine | Admitting: Family Medicine

## 2023-01-03 DIAGNOSIS — J4540 Moderate persistent asthma, uncomplicated: Secondary | ICD-10-CM

## 2023-01-03 MED ORDER — ALBUTEROL SULFATE 2.5 MG/3 ML (0.083 %) SOLUTION FOR NEBULIZATION
2.5000 mg | INHALATION_SOLUTION | RESPIRATORY_TRACT | Status: AC
Start: 2023-01-03 — End: 2023-01-03
  Administered 2023-01-03: 2.5 mg via RESPIRATORY_TRACT

## 2023-01-06 ENCOUNTER — Ambulatory Visit (HOSPITAL_COMMUNITY): Payer: Self-pay

## 2023-01-06 NOTE — Procedures (Signed)
[facility]  PULMONARY FUNCTION TEST    Pulmonary Procedure - PFT  Patient Name:  Steve Young       Date of Birth: January 17, 1961  Sex: Male            PFT Interpretation    Quality of Data:  The ATS standards for acceptability and reproducibility were met.    Test Performed:  Complete pulmonary function tests, including spirometry with and without bronchodilator, measurement of lung volume, and diffusing capacity.    Spirometry Interpretation:  Spirometry is within normal limits.  FEV1/FVC is normal (0.75). FEV1 is 95%. FVC is 98%.   There is no significant bronchodilator response noted.    Flow Volume Loops:  Flow-Volume loops are normal.    Lung Volumes:  Plethysmographic lung volumes show total lung capacity at 95%. Residual volume is 98%. RV/TLC is normal.    Diffusing Capacity:  Uncorrected diffusing capacity of the lung is normal at 88%.      Conclusions:  Normal pulmonary function test results.  There is no evidence of obstruction.  There is no significant bronchodilator response noted. Lung volumes are normal. Diffusing capacity of the lung is normal.  Clinical and radiological correlation recommended.    Lennox Pippins, MD

## 2023-01-08 ENCOUNTER — Encounter (HOSPITAL_COMMUNITY): Payer: Self-pay

## 2023-01-08 ENCOUNTER — Emergency Department (HOSPITAL_COMMUNITY): Payer: Medicare (Managed Care)

## 2023-01-08 ENCOUNTER — Inpatient Hospital Stay (HOSPITAL_COMMUNITY): Payer: Medicare (Managed Care) | Admitting: HOSPITALIST-INTERNAL MEDICINE

## 2023-01-08 ENCOUNTER — Inpatient Hospital Stay
Admission: EM | Admit: 2023-01-08 | Discharge: 2023-01-12 | DRG: 178 | Disposition: A | Discharge status: Home / Self Care | Payer: Medicare (Managed Care) | Attending: INTERNAL MEDICINE | Admitting: INTERNAL MEDICINE

## 2023-01-08 ENCOUNTER — Other Ambulatory Visit: Payer: Self-pay

## 2023-01-08 DIAGNOSIS — A419 Sepsis, unspecified organism: Secondary | ICD-10-CM | POA: Diagnosis present

## 2023-01-08 DIAGNOSIS — Z7982 Long term (current) use of aspirin: Secondary | ICD-10-CM

## 2023-01-08 DIAGNOSIS — K8689 Other specified diseases of pancreas: Secondary | ICD-10-CM | POA: Diagnosis present

## 2023-01-08 DIAGNOSIS — I10 Essential (primary) hypertension: Secondary | ICD-10-CM

## 2023-01-08 DIAGNOSIS — F39 Unspecified mood [affective] disorder: Secondary | ICD-10-CM

## 2023-01-08 DIAGNOSIS — J45901 Unspecified asthma with (acute) exacerbation: Secondary | ICD-10-CM

## 2023-01-08 DIAGNOSIS — E781 Pure hyperglyceridemia: Secondary | ICD-10-CM | POA: Diagnosis present

## 2023-01-08 DIAGNOSIS — E1140 Type 2 diabetes mellitus with diabetic neuropathy, unspecified: Secondary | ICD-10-CM | POA: Diagnosis present

## 2023-01-08 DIAGNOSIS — K5732 Diverticulitis of large intestine without perforation or abscess without bleeding: Secondary | ICD-10-CM | POA: Diagnosis present

## 2023-01-08 DIAGNOSIS — U071 COVID-19: Principal | ICD-10-CM | POA: Diagnosis present

## 2023-01-08 DIAGNOSIS — E669 Obesity, unspecified: Secondary | ICD-10-CM | POA: Diagnosis present

## 2023-01-08 DIAGNOSIS — Z789 Other specified health status: Secondary | ICD-10-CM

## 2023-01-08 DIAGNOSIS — E874 Mixed disorder of acid-base balance: Secondary | ICD-10-CM | POA: Diagnosis present

## 2023-01-08 DIAGNOSIS — Z6832 Body mass index (BMI) 32.0-32.9, adult: Secondary | ICD-10-CM

## 2023-01-08 DIAGNOSIS — E876 Hypokalemia: Secondary | ICD-10-CM | POA: Diagnosis present

## 2023-01-08 DIAGNOSIS — G8929 Other chronic pain: Secondary | ICD-10-CM

## 2023-01-08 DIAGNOSIS — J4531 Mild persistent asthma with (acute) exacerbation: Secondary | ICD-10-CM | POA: Diagnosis present

## 2023-01-08 DIAGNOSIS — K529 Noninfective gastroenteritis and colitis, unspecified: Secondary | ICD-10-CM | POA: Diagnosis present

## 2023-01-08 DIAGNOSIS — R7989 Other specified abnormal findings of blood chemistry: Secondary | ICD-10-CM

## 2023-01-08 DIAGNOSIS — I440 Atrioventricular block, first degree: Secondary | ICD-10-CM | POA: Diagnosis present

## 2023-01-08 DIAGNOSIS — E785 Hyperlipidemia, unspecified: Secondary | ICD-10-CM

## 2023-01-08 DIAGNOSIS — Z7984 Long term (current) use of oral hypoglycemic drugs: Secondary | ICD-10-CM

## 2023-01-08 DIAGNOSIS — K219 Gastro-esophageal reflux disease without esophagitis: Secondary | ICD-10-CM | POA: Diagnosis present

## 2023-01-08 DIAGNOSIS — Z7985 Long-term (current) use of injectable non-insulin antidiabetic drugs: Secondary | ICD-10-CM

## 2023-01-08 DIAGNOSIS — K5792 Diverticulitis of intestine, part unspecified, without perforation or abscess without bleeding: Secondary | ICD-10-CM

## 2023-01-08 DIAGNOSIS — N179 Acute kidney failure, unspecified: Secondary | ICD-10-CM

## 2023-01-08 DIAGNOSIS — Z8616 Personal history of COVID-19: Secondary | ICD-10-CM

## 2023-01-08 DIAGNOSIS — E1169 Type 2 diabetes mellitus with other specified complication: Secondary | ICD-10-CM

## 2023-01-08 DIAGNOSIS — E860 Dehydration: Secondary | ICD-10-CM | POA: Diagnosis present

## 2023-01-08 LAB — ECG 12 LEAD
Atrial Rate: 68 {beats}/min
Calculated R Axis: 43 degrees
QRS Duration: 94 ms
QT Interval: 382 ms

## 2023-01-08 MED ORDER — IPRATROPIUM 0.5 MG-ALBUTEROL 3 MG (2.5 MG BASE)/3 ML NEBULIZATION SOLN
3.0000 mL | INHALATION_SOLUTION | RESPIRATORY_TRACT | Status: AC
Start: 2023-01-08 — End: ?
  Administered 2023-01-09: 3 mL via RESPIRATORY_TRACT
  Filled 2023-01-08: qty 3

## 2023-01-08 MED ORDER — ACETAMINOPHEN 325 MG TABLET
975.0000 mg | ORAL_TABLET | ORAL | Status: AC
Start: 2023-01-08 — End: 2023-01-08
  Administered 2023-01-08: 975 mg via ORAL
  Filled 2023-01-08: qty 3

## 2023-01-08 MED ORDER — ONDANSETRON HCL (PF) 4 MG/2 ML INJECTION SOLUTION
4.0000 mg | INTRAMUSCULAR | Status: AC
Start: 2023-01-08 — End: 2023-01-08
  Administered 2023-01-08: 4 mg via INTRAVENOUS
  Filled 2023-01-08: qty 2

## 2023-01-08 MED ORDER — METHYLPREDNISOLONE SOD SUCC 125 MG SOLUTION FOR INJECTION WRAPPER
125.0000 mg | INTRAVENOUS | Status: AC
Start: 2023-01-08 — End: 2023-01-08
  Administered 2023-01-08: 125 mg via INTRAVENOUS
  Filled 2023-01-08: qty 2

## 2023-01-08 NOTE — ED Provider Notes (Signed)
[facility]           Name: Steve Young  Age and Gender: 62 y.o. male  PCP: Peter Minium, MD    Chief Complaint:  Patient presents with     Chief Complaint   Patient presents with    Generalized Body Aches    Vomiting       HPI      Generalized Body Aches  Associated symptoms: vomiting    Vomiting       Steve Young, date of birth [birth year], is a 62 y.o. male who presents to the Emergency Department from home by Car with a chief complaint of fever, productive cough, shortness of breath at times, chest discomfort, nausea, abdominal discomfort.  Symptoms ongoing since Tuesday.  Started with a fever and cough.  Patient is now sitting in discussed some discomfort in his chest feels like pressure or tightness.  Patient was having some abdominal discomfort and nausea.  No diarrhea or dysuria or penile discharge.  Patient was denies sick contacts.    Other than noted above, review of systems obtained and negative.      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    Hepatic lesion 10/04/2018    Hyperlipidemia     Hypertension     Hypertriglyceridemia 05/15/2022    Hypokalemia 11/18/2020    Impingement syndrome of left shoulder 12/28/2017    Metabolic dysfunction-associated steatotic liver disease (MASLD) 10/04/2018    Mild persistent asthma without complication 03/21/2018    NAFL (nonalcoholic fatty liver)     Normal  cardiac stress test 03/21/2018    Nosebleed     Obesity     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    Partial tear of common extensor tendon of elbow 12/13/2017    Pituitary tumor     Pre-diabetes     Rib pain 10/04/2018    Sexual dysfunction     Tear of talofibular ligament 05/30/2013    RIGHT    Vitamin D deficiency        Past Surgical History:  Past Surgical History:   Procedure Laterality Date    Colonoscopy      Elbow surgery Left 12/2017    Hx other  2004    Hx other Right     Hx shoulder surgery Bilateral     Nose surgery         Family History:   Family History   Problem Relation Age of Onset    Cancer Mother     Cancer Sister        Social History     Social History     Tobacco Use    Smoking status: Never    Smokeless tobacco: Never   Vaping Use  Vaping status: Never Used   Substance Use Topics    Alcohol use: Not Currently    Drug use: Never       Social History     Substance and Sexual Activity   Drug Use Never       Peter Minium, MD    No Known Allergies    No outpatient medications have been marked as taking for the 01/08/23 encounter [facility] Encounter).       PE:   ED Triage Vitals [01/08/23 2159]   BP (Non-Invasive) 115/81   Heart Rate 79   Respiratory Rate 20   Temperature 36.4 C (97.5 F)   SpO2 98 %   Weight 99.8 kg (220 lb)   Height 1.753 m (5\' 9" )     Physical Exam  Vitals and nursing note reviewed.   Constitutional:       General: He is not in acute distress.     Appearance: Normal appearance. He is not ill-appearing, toxic-appearing or diaphoretic.   HENT:      Nose: Congestion present.   Eyes:      Extraocular Movements: Extraocular movements intact.      Conjunctiva/sclera: Conjunctivae normal.      Pupils: Pupils are equal, round, and reactive to light.   Cardiovascular:      Rate and Rhythm: Normal rate and regular rhythm.      Pulses: Normal pulses.      Heart sounds: Normal heart sounds.   Pulmonary:      Effort: Pulmonary effort is normal. No respiratory distress.       Breath sounds: Wheezing present. No rhonchi or rales.   Abdominal:      General: Bowel sounds are normal.      Palpations: Abdomen is soft.      Tenderness: There is abdominal tenderness in the left lower quadrant. There is no right CVA tenderness, left CVA tenderness, guarding or rebound. Negative signs include Murphy's sign and McBurney's sign.   Musculoskeletal:         General: Normal range of motion.   Skin:     General: Skin is warm.      Capillary Refill: Capillary refill takes less than 2 seconds.   Neurological:      General: No focal deficit present.      Mental Status: He is alert and oriented to person, place, and time.   Psychiatric:         Behavior: Behavior normal.           DDx:  Differential diagnosis includes, but is not limited to viral syndrome, COVID-19, chronic obstructive pulmonary disease exacerbation, pneumonia, acute coronary syndrome, heart failure exacerbation, diverticulitis, bowel obstruction, pancreatitis, appendicitis, gallbladder disease, peritonitis, urinary tract infection, pyelonephritis, urolithiasis    Initial workup:  Physical exam orders as below    Orders:  Orders Placed This Encounter    RAPID THROAT SCREEN, STREPTOCOCCUS, WITH REFLEX    GROUP A STREP CULTURE (UTN ONLY)    ADULT ROUTINE BLOOD CULTURE, SET OF 2 BOTTLES (BACTERIA AND YEAST)    ADULT ROUTINE BLOOD CULTURE, SET OF 2 BOTTLES (BACTERIA AND YEAST)    XR AP MOBILE CHEST    CT ABDOMEN PELVIS W IV CONTRAST    COVID-19 SCREENING - Asymptomatic    B-TYPE NATRIURETIC PEPTIDE    CBC/DIFF    COMPREHENSIVE METABOLIC PANEL, NON-FASTING    URINALYSIS WITH REFLEX MICROSCOPIC AND CULTURE IF POSITIVE    LACTIC ACID LEVEL W/ REFLEX FOR LEVEL >2.0  LIPASE    MAGNESIUM    PT/INR    PTT (PARTIAL THROMBOPLASTIN TIME)    TROPONIN-I    CBC WITH DIFF    URINALYSIS, MACRO/MICRO    VENOUS BLOOD GAS    BETA-HYDROXYBUTYRATE, PLASMA OR SERUM    EXTRA TUBES    RED TOP TUBE    URINALYSIS, MICROSCOPIC    LACTIC ACID - FIRST REFLEX     TROPONIN-I    BASIC METABOLIC PANEL    LACTIC ACID - SECOND REFLEX    ECG 12 LEAD    PATIENT CLASS/LEVEL OF CARE DESIGNATION - UTN    methylPREDNISolone sod succ (SOLU-medrol) 125 mg/2 mL injection    ipratropium-albuterol 0.5 mg-3 mg(2.5 mg base)/3 mL Solution for Nebulization    acetaminophen (TYLENOL) tablet    ondansetron (ZOFRAN) 2 mg/mL injection    NS bolus infusion 1,000 mL    potassium chloride 10 mEq in SW 100 mL premix infusion    potassium bicarbonate-citric acid (EFFER-K) effervescent tablet    iopamidol (ISOVUE-370) 76% infusion    cefTRIAXone (ROCEPHIN) 2 g in NS 100 mL IVPB minibag    metroNIDAZOLE (FLAGYL) 500 mg in NS 100 mL premix IVPB    NS bolus infusion 1,000 mL    potassium chloride 10 mEq in SW 100 mL premix infusion    electrolyte-A (PLASMALYTE-A) premix infusion    LR bolus infusion 1,000 mL           Diagnostics:  I have personally reviewed all laboratory and imaging results for this patient.  Results are as listed below.  Labs:  Results for orders placed or performed during the hospital encounter of 01/08/23 (from the past 12 hour(s))   COVID-19 SCREENING - Asymptomatic   Result Value Ref Range    SARS-COV-2 Detected (AA) Not Detected    INFLUENZA VIRUS TYPE A Not Detected Not Detected    INFLUENZA VIRUS TYPE B Not Detected Not Detected   B-TYPE NATRIURETIC PEPTIDE   Result Value Ref Range    BNP 11 <=99 pg/mL   COMPREHENSIVE METABOLIC PANEL, NON-FASTING   Result Value Ref Range    SODIUM 138 136 - 145 mmol/L    POTASSIUM 2.3 (LL) 3.5 - 5.1 mmol/L    CHLORIDE 102 96 - 111 mmol/L    CO2 TOTAL 22 (L) 23 - 31 mmol/L    ANION GAP 14 (H) 4 - 13 mmol/L    BUN 13 8 - 25 mg/dL    CREATININE 4.40 (H) 0.75 - 1.35 mg/dL    BUN/CREA RATIO 9 6 - 22    ALBUMIN 3.8 3.4 - 4.8 g/dL     CALCIUM 9.4 8.6 - 10.2 mg/dL    GLUCOSE 725 65 - 366 mg/dL    ALKALINE PHOSPHATASE 69 45 - 115 U/L    ALT (SGPT) 21 10 - 55 U/L    AST (SGOT)  19 8 - 45 U/L    BILIRUBIN TOTAL 0.8 0.3 - 1.3 mg/dL    PROTEIN TOTAL 6.9 6.0 -  8.0 g/dL    ESTIMATED GFR - MALE 52 (L) >=60 mL/min/BSA   LACTIC ACID LEVEL W/ REFLEX FOR LEVEL >2.0   Result Value Ref Range    LACTIC ACID 2.6 (H) 0.5 - 2.2 mmol/L   LIPASE   Result Value Ref Range    LIPASE 9 (L) 10 - 60 U/L   MAGNESIUM   Result Value Ref Range    MAGNESIUM 1.9 1.8 - 2.6 mg/dL   PT/INR  Result Value Ref Range    PROTHROMBIN TIME 11.0 9.0 - 13.0 seconds    INR 1.01 0.90 - 1.10   PTT (PARTIAL THROMBOPLASTIN TIME)   Result Value Ref Range    APTT 25.3 23.0 - 32.0 seconds   TROPONIN-I   Result Value Ref Range    TROPONIN-I HS 7.9 <=35.0 ng/L ng/L   CBC WITH DIFF   Result Value Ref Range    WBC 7.4 3.7 - 11.0 x10^3/uL    RBC 4.59 4.50 - 6.10 x10^6/uL    HGB 13.4 13.4 - 17.5 g/dL    HCT 52.8 (L) 41.3 - 52.0 %    MCV 80.8 78.0 - 100.0 fL    MCH 29.2 26.0 - 32.0 pg    MCHC 36.1 (H) 31.0 - 35.5 g/dL    RDW-CV 24.4 01.0 - 27.2 %    PLATELETS 233 150 - 400 x10^3/uL    MPV 10.9 8.7 - 12.5 fL    NEUTROPHIL % 57.0 %    LYMPHOCYTE % 32.0 %    MONOCYTE % 10.0 %    EOSINOPHIL % 0.0 %    BASOPHIL % 1.0 %    NEUTROPHIL # 4.22 1.50 - 7.70 x10^3/uL    LYMPHOCYTE # 2.39 1.00 - 4.80 x10^3/uL    MONOCYTE # 0.71 0.20 - 1.10 x10^3/uL    EOSINOPHIL # <0.10 <=0.50 x10^3/uL    BASOPHIL # <0.10 <=0.20 x10^3/uL    IMMATURE GRANULOCYTE % 0.0 0.0 - 1.0 %    IMMATURE GRANULOCYTE # <0.10 <0.10 x10^3/uL   VENOUS BLOOD GAS   Result Value Ref Range    BICARBONATE (VENOUS) 26.7 22.5 - 33.0 mmol/L    PCO2 (VENOUS) 33 (L) 35 - 54 mm/Hg    PH (VENOUS) 7.52 (HH) 7.34 - 7.42    PO2 (VENOUS) 52 mm/Hg    BASE EXCESS 4.3 (H) 0.0 - 2.0 mmol/L    O2 SATURATION (VENOUS)      %FIO2 (VENOUS)     BETA-HYDROXYBUTYRATE, PLASMA OR SERUM   Result Value Ref Range    BETA-HYDROXYBUTYRATE 0.1 <0.4 mmol/L   RED TOP TUBE   Result Value Ref Range    RAINBOW/EXTRA TUBE AUTO RESULT Yes    URINALYSIS, MACRO/MICRO   Result Value Ref Range    COLOR Dark Yellow (A) Yellow    APPEARANCE Clear Clear    SPECIFIC GRAVITY 1.023 1.001 - 1.030    PH 6.5 5.0 - 9.0     PROTEIN 30 (A) Negative mg/dL    GLUCOSE Negative Negative mg/dL    KETONES Negative Negative mg/dL    UROBILINOGEN 1.0 0.2 - 1.0 mg/dL    BILIRUBIN Negative Negative mg/dL    BLOOD Negative Negative mg/dL    NITRITE Negative Negative    LEUKOCYTES Negative Negative WBCs/uL   URINALYSIS, MICROSCOPIC   Result Value Ref Range    SQUAMOUS EPITHELIAL Few (A) None /hpf    WBCS 1-3 (A) None /hpf    RBCS 0-2 (A) None /hpf    BACTERIA Negative Negative /hpf    HYALINE CASTS 10-20 (A) None /lpf   TROPONIN-I   Result Value Ref Range    TROPONIN-I HS 7.1 <=35.0 ng/L ng/L   RAPID THROAT SCREEN, STREPTOCOCCUS, WITH REFLEX    Specimen: Throat; Swab   Result Value Ref Range    THROAT RAPID SCREEN, STREPTOCOCCUS Negative Negative   LACTIC ACID - FIRST REFLEX   Result Value Ref Range    LACTIC ACID 4.1 (HH) 0.5 - 2.2  mmol/L   BASIC METABOLIC PANEL   Result Value Ref Range    SODIUM 136 136 - 145 mmol/L    POTASSIUM 2.7 (LL) 3.5 - 5.1 mmol/L    CHLORIDE 101 96 - 111 mmol/L    CO2 TOTAL 19 (L) 23 - 31 mmol/L    ANION GAP 16 (H) 4 - 13 mmol/L    CALCIUM 9.3 8.6 - 10.3 mg/dL    GLUCOSE 454 (H) 65 - 125 mg/dL    BUN 13 8 - 25 mg/dL    CREATININE 0.98 (H) 0.75 - 1.35 mg/dL    BUN/CREA RATIO 9 6 - 22    ESTIMATED GFR - MALE 52 (L) >=60 mL/min/BSA     Labs reviewed and interpreted by me.    Radiology:    CT ABDOMEN PELVIS W IV CONTRAST   Final Result by Edi, Radresults In (08/25 0133)   Subtle inflammation and wall thickening of the distal colon at the junction of the descending colon and sigmoid colon which may indicate a mild diverticulitis or a low-grade early infectious or inflammatory colitis.  No evidence of bowel obstruction or perforation.   Moderate pancreatic atrophy.   Hepatic steatosis.         Signed by Arva Chafe      XR AP MOBILE CHEST   Final Result by Edi, Radresults In (08/25 0034)   No acute cardiopulmonary disease.           Signed by Arva Chafe          EKG:  Most Recent EKG This Encounter   ECG 12 LEAD     Collection Time: 01/08/23 11:17 PM   Result Value    Ventricular rate 68    Atrial Rate 68    PR Interval 234    QRS Duration 94    QT Interval 382    QTC Calculation 406    Calculated P Axis 48    Calculated R Axis 43    Calculated T Axis 19    Narrative    Sinus rhythm with 1st degree AV block  no acute ischemic changes  AV block seen previously  Confirmed by Darrick Huntsman ([id number]) on 01/09/2023 1:09:51 AM       Medical Decision Making  Amount and/or Complexity of Data Reviewed  Labs: ordered.  Radiology: ordered. Decision-making details documented in ED Course.  ECG/medicine tests: ordered and independent interpretation performed.    Risk  OTC drugs.  Prescription drug management.  Decision regarding hospitalization.     62 year old male presenting multiple complaints but chief complaint of feeling fever, productive cough, shortness of breath with exertion, chest pain on exertion, nausea, vomiting, diarrhea, lower abdominal discomfort.  EKG not showing ischemic changes.  Wheezing heard throughout lungs feel we will you patient was steroid dose and nebulizer treatment, chest x-ray ordered, check viral panel.  Shortness of breath and chest pain we will check troponin EKG.  Nausea, vomiting, diarrhea and left lower quadrant abdominal pain, tender to palpation.  CT to assess abdomen, medications for symptom relief, check electrolytes and blood counts for signs of infection, electrolyte abnormality, anemia, hydration status.    During the patient's stay in the emergency department, the listed imaging and/or labs were performed to assist with medical decision making and were reviewed by myself when available for review.     ED Course:  ED Course as of 01/09/23 0548   Sun Jan 09, 2023   0026 WBC: 7.4  0026 HGB: 13.4   0026 HCT(!): 37.1   0026 MCHC(!): 36.1   0026 PLATELET COUNT: 233   0027 PMN'S: 57.0   0027 SARS CORONAVIRUS 2 (SARS-CoV-2)(!!): Detected   0027 COLOR(!): Dark Yellow   0027 PROTEIN(!): 30   0027 BLOOD:  Negative   0027 NITRITE: Negative   0027 LEUKOCYTES: Negative   0027 SQUAMOUS EPITHELIAL(!): Few   0027 WBC'S(!): 1-3   0027 RBC'S(!): 0-2   0027 BACTERIA: Negative   0027 HYALINE CAST(!): 10-20   0027 PH(!!): 7.52   0027 PCO2(!): 33   0027 BICARBONATE: 26.7   0043 LACTIC ACID(!): 2.6   0043 PROTHROMBIN TIME: 11.0   0043 INR: 1.01   0043 aPTT: 25.3   0043 XR AP MOBILE CHEST   0044 LIPASE(!): 9   0045 MAGNESIUM: 1.9   0047 SODIUM: 138   0047 POTASSIUM(!!): 2.3   0047 CARBON DIOXIDE(!): 22   0047 ANION GAP(!): 14   0047 CREATININE(!): 1.51   0047 BUN/CREAT RATIO: 9   0047 GLUCOSE: 113   0047 ALKALINE PHOSPHATASE: 69   0047 ALT (SGPT): 21   0047 AST (SGOT): 19   0047 BILIRUBIN, TOTAL: 0.8   0047 TOTAL PROTEIN: 6.9   0134 B-TYPE NATRIURETIC PEPTIDE: 11   0135 CT ABDOMEN PELVIS W IV CONTRAST  IMPRESSION:  Subtle inflammation and wall thickening of the distal colon at the junction of the descending colon and sigmoid colon which may indicate a mild diverticulitis or a low-grade early infectious or inflammatory colitis.  No evidence of bowel obstruction or perforation.  Moderate pancreatic atrophy.  Hepatic steatosis.     0155 BETA-HYDROXYBUTYRATE: 0.1   0302 THROAT RAPID SCREEN, STREPTOCOCCUS: Negative   0329 TROPONIN-I HS: 7.1   0351 LACTIC ACID(!!): 4.1  Fluid ordered.  Vital signs stable.  Patient was afebrile.     0444 POTASSIUM(!!): 2.7  Additional potassium ordered.  Plan for admission    0444 CARBON DIOXIDE(!): 19   0444 ANION GAP(!): 16   0444 GLUCOSE(!): 172   0444 CREATININE(!): 1.50   0444 ESTIMATED GFR - MALE(!): 52   0513 Workup findings discussed with the patient was.  Recommend patient be admitted for COVID, hypokalemic, diverticulitis, lactic this is, acute kidney injury, patient was agreeable to admission.  Currently vital signs stable.  Respiratory status stable.  Patient was reporting symptom improvement.  Discussed case with ED attending agreed with plan to admit patient at this time.  Contacted  admitting service Dr. Leticia Clas, who agreed to see patient for admission.  Admitting service requested using 4 additional K runs.   3016 CREATININE(!): 1.51       Medications given during ED stay include:  Medications Administered in the ED   methylPREDNISolone sod succ (SOLU-medrol) 125 mg/2 mL injection (125 mg Intravenous Given 01/08/23 2347)   ipratropium-albuterol 0.5 mg-3 mg(2.5 mg base)/3 mL Solution for Nebulization (3 mL Nebulization Given 01/09/23 0007)   acetaminophen (TYLENOL) tablet (975 mg Oral Given 01/08/23 2347)   ondansetron (ZOFRAN) 2 mg/mL injection (4 mg Intravenous Given 01/08/23 2347)   NS bolus infusion 1,000 mL (0 mL Intravenous Stopped 01/09/23 0151)   potassium chloride 10 mEq in SW 100 mL premix infusion (0 mEq Intravenous Stopped 01/09/23 0323)   potassium bicarbonate-citric acid (EFFER-K) effervescent tablet (40 mEq Oral Given 01/09/23 0121)   iopamidol (ISOVUE-370) 76% infusion (100 mL Intravenous Given 01/09/23 0105)   cefTRIAXone (ROCEPHIN) 2 g in NS 100 mL IVPB minibag (0 g Intravenous Stopped 01/09/23 0252)  metroNIDAZOLE (FLAGYL) 500 mg in NS 100 mL premix IVPB (500 mg Intravenous New Bag/New Syringe 01/09/23 0352)   NS bolus infusion 1,000 mL (0 mL Intravenous Stopped 01/09/23 0252)         During the patient's stay in the emergency department, the above listed imaging and/or labs were performed to assist with medical decision making and were reviewed by myself when available for review.   Relevant prior external notes and tests were reviewed by myself if available.  Patient rechecked and remained stable throughout remainder of emergency department course.   All questions/concerns addressed, and patient agrees with disposition plan.    Current Discharge Medication List          Clinical Impression:   Clinical Impression   COVID-19 (Primary)   Hypokalemia   Elevated lactic acid level   Diverticulitis   AKI (acute kidney injury) (CMS HCC)       Disposition: Admitted        // Lorenza Burton, FNP-C  01/08/2023, 23:34   [facility]    Parts of this patients chart were completed in a retrospective fashion due to simultaneous direct patient care activities in the Emergency Department.   This note was partially generated using MModal Fluency Direct System, and there may be some incorrect words, spellings, and punctuation that were not noted in proofreading the note prior to saving.

## 2023-01-08 NOTE — ED Triage Notes (Signed)
N/v/d since Tuesday, weakness all over, feelings of lightheaded, only keeping down some fluids, not eating.

## 2023-01-09 ENCOUNTER — Emergency Department (HOSPITAL_COMMUNITY): Payer: Medicare (Managed Care)

## 2023-01-09 ENCOUNTER — Encounter (HOSPITAL_COMMUNITY): Payer: Self-pay | Admitting: PHYSICIAN ASSISTANT

## 2023-01-09 DIAGNOSIS — R197 Diarrhea, unspecified: Secondary | ICD-10-CM

## 2023-01-09 DIAGNOSIS — R7989 Other specified abnormal findings of blood chemistry: Secondary | ICD-10-CM

## 2023-01-09 DIAGNOSIS — I440 Atrioventricular block, first degree: Secondary | ICD-10-CM

## 2023-01-09 DIAGNOSIS — U071 COVID-19: Principal | ICD-10-CM

## 2023-01-09 DIAGNOSIS — N179 Acute kidney failure, unspecified: Secondary | ICD-10-CM

## 2023-01-09 DIAGNOSIS — J45901 Unspecified asthma with (acute) exacerbation: Secondary | ICD-10-CM

## 2023-01-09 DIAGNOSIS — K5792 Diverticulitis of intestine, part unspecified, without perforation or abscess without bleeding: Secondary | ICD-10-CM

## 2023-01-09 DIAGNOSIS — E876 Hypokalemia: Secondary | ICD-10-CM

## 2023-01-09 DIAGNOSIS — R1032 Left lower quadrant pain: Secondary | ICD-10-CM

## 2023-01-09 DIAGNOSIS — E872 Acidosis, unspecified: Secondary | ICD-10-CM

## 2023-01-09 DIAGNOSIS — G629 Polyneuropathy, unspecified: Secondary | ICD-10-CM

## 2023-01-09 DIAGNOSIS — R112 Nausea with vomiting, unspecified: Secondary | ICD-10-CM

## 2023-01-09 DIAGNOSIS — R7303 Prediabetes: Secondary | ICD-10-CM

## 2023-01-09 DIAGNOSIS — I10 Essential (primary) hypertension: Secondary | ICD-10-CM

## 2023-01-09 DIAGNOSIS — A419 Sepsis, unspecified organism: Secondary | ICD-10-CM

## 2023-01-09 HISTORY — DX: Diverticulitis of intestine, part unspecified, without perforation or abscess without bleeding: K57.92

## 2023-01-09 HISTORY — DX: Unspecified asthma with (acute) exacerbation: J45.901

## 2023-01-09 HISTORY — DX: Acute kidney failure, unspecified: N17.9

## 2023-01-09 HISTORY — DX: Sepsis, unspecified organism: A41.9

## 2023-01-09 HISTORY — DX: Other specified abnormal findings of blood chemistry: R79.89

## 2023-01-09 LAB — COVID-19 ~~LOC~~ MOLECULAR LAB TESTING
INFLUENZA VIRUS TYPE A: NOT DETECTED
INFLUENZA VIRUS TYPE B: NOT DETECTED
SARS-COV-2: DETECTED — CR

## 2023-01-09 LAB — CBC WITH DIFF
BASOPHIL #: <0.1 10*3/uL (ref <=0.20)
BASOPHIL #: <0.1 10*3/uL (ref <=0.20)
BASOPHIL %: 0 %
BASOPHIL %: 1 %
EOSINOPHIL #: <0.1 10*3/uL (ref <=0.50)
EOSINOPHIL #: <0.1 10*3/uL (ref <=0.50)
EOSINOPHIL %: 0 %
EOSINOPHIL %: 0 %
HCT: 35.4 % — ABNORMAL LOW (ref 38.9–52.0)
HCT: 37.1 % — ABNORMAL LOW (ref 38.9–52.0)
HGB: 13.1 g/dL — ABNORMAL LOW (ref 13.4–17.5)
HGB: 13.4 g/dL (ref 13.4–17.5)
IMMATURE GRANULOCYTE #: <0.1 10*3/uL (ref <0.10)
IMMATURE GRANULOCYTE #: <0.1 10*3/uL (ref <0.10)
IMMATURE GRANULOCYTE %: 0 % (ref 0.0–1.0)
IMMATURE GRANULOCYTE %: 0 % (ref 0.0–1.0)
LYMPHOCYTE #: 0.81 10*3/uL — ABNORMAL LOW (ref 1.00–4.80)
LYMPHOCYTE #: 2.39 10*3/uL (ref 1.00–4.80)
LYMPHOCYTE %: 17 %
LYMPHOCYTE %: 32 %
MCH: 29.2 pg (ref 26.0–32.0)
MCH: 29.7 pg (ref 26.0–32.0)
MCHC: 36.1 g/dL — ABNORMAL HIGH (ref 31.0–35.5)
MCHC: 37 g/dL — ABNORMAL HIGH (ref 31.0–35.5)
MCV: 80.3 fL (ref 78.0–100.0)
MCV: 80.8 fL (ref 78.0–100.0)
MONOCYTE #: 0.1 10*3/uL — ABNORMAL LOW (ref 0.20–1.10)
MONOCYTE #: 0.71 10*3/uL (ref 0.20–1.10)
MONOCYTE %: 10 %
MONOCYTE %: 2 %
MPV: 10.9 fL (ref 8.7–12.5)
MPV: 10.9 fL (ref 8.7–12.5)
NEUTROPHIL #: 3.77 10*3/uL (ref 1.50–7.70)
NEUTROPHIL #: 4.22 10*3/uL (ref 1.50–7.70)
NEUTROPHIL %: 57 %
NEUTROPHIL %: 81 %
PLATELETS: 233 10*3/uL (ref 150–400)
PLATELETS: 236 10*3/uL (ref 150–400)
RBC: 4.41 10*6/uL — ABNORMAL LOW (ref 4.50–6.10)
RBC: 4.59 10*6/uL (ref 4.50–6.10)
RDW-CV: 13.9 % (ref 11.5–15.5)
RDW-CV: 14.1 % (ref 11.5–15.5)
WBC: 4.7 10*3/uL (ref 3.7–11.0)
WBC: 7.4 10*3/uL (ref 3.7–11.0)

## 2023-01-09 LAB — TROPONIN-I
TROPONIN-I HS: 7.1 ng/L (ref <=35.0)
TROPONIN-I HS: 7.9 ng/L (ref <=35.0)

## 2023-01-09 LAB — URINALYSIS, MICROSCOPIC: BACTERIA: NEGATIVE /hpf

## 2023-01-09 LAB — URINALYSIS, MACRO/MICRO
BILIRUBIN: NEGATIVE mg/dL
BLOOD: NEGATIVE mg/dL
GLUCOSE: NEGATIVE mg/dL
KETONES: NEGATIVE mg/dL
LEUKOCYTES: NEGATIVE WBCs/uL
NITRITE: NEGATIVE
PH: 6.5 (ref 5.0–9.0)
PROTEIN: 30 mg/dL — AB
SPECIFIC GRAVITY: 1.023 (ref 1.001–1.030)
UROBILINOGEN: 1 mg/dL (ref 0.2–1.0)

## 2023-01-09 LAB — RAPID THROAT SCREEN, STREPTOCOCCUS, WITH REFLEX: THROAT RAPID SCREEN, STREPTOCOCCUS: NEGATIVE

## 2023-01-09 LAB — COMPREHENSIVE METABOLIC PANEL, NON-FASTING
ALBUMIN: 3.8 g/dL (ref 3.4–4.8)
ALKALINE PHOSPHATASE: 69 U/L (ref 45–115)
ALT (SGPT): 21 U/L (ref 10–55)
ANION GAP: 14 mmol/L — ABNORMAL HIGH (ref 4–13)
AST (SGOT): 19 U/L (ref 8–45)
BILIRUBIN TOTAL: 0.8 mg/dL (ref 0.3–1.3)
BUN/CREA RATIO: 9 (ref 6–22)
BUN: 13 mg/dL (ref 8–25)
CALCIUM: 9.4 mg/dL (ref 8.6–10.3)
CHLORIDE: 102 mmol/L (ref 96–111)
CO2 TOTAL: 22 mmol/L — ABNORMAL LOW (ref 23–31)
CREATININE: 1.51 mg/dL — ABNORMAL HIGH (ref 0.75–1.35)
ESTIMATED GFR - MALE: 52 mL/min/BSA — ABNORMAL LOW (ref >=60)
GLUCOSE: 113 mg/dL (ref 65–125)
POTASSIUM: 2.3 mmol/L — CL (ref 3.5–5.1)
PROTEIN TOTAL: 6.9 g/dL (ref 6.0–8.0)
SODIUM: 138 mmol/L (ref 136–145)

## 2023-01-09 LAB — BASIC METABOLIC PANEL
ANION GAP: 16 mmol/L — ABNORMAL HIGH (ref 4–13)
ANION GAP: 15 mmol/L — ABNORMAL HIGH (ref 4–13)
BUN/CREA RATIO: 9 (ref 6–22)
BUN/CREA RATIO: 10 (ref 6–22)
BUN: 13 mg/dL (ref 8–25)
BUN: 12 mg/dL (ref 8–25)
CALCIUM: 9.3 mg/dL (ref 8.6–10.3)
CALCIUM: 9.4 mg/dL (ref 8.6–10.3)
CHLORIDE: 101 mmol/L (ref 96–111)
CHLORIDE: 100 mmol/L (ref 96–111)
CO2 TOTAL: 19 mmol/L — ABNORMAL LOW (ref 23–31)
CO2 TOTAL: 20 mmol/L — ABNORMAL LOW (ref 23–31)
CREATININE: 1.5 mg/dL — ABNORMAL HIGH (ref 0.75–1.35)
CREATININE: 1.18 mg/dL (ref 0.75–1.35)
ESTIMATED GFR - MALE: 52 mL/min/BSA — ABNORMAL LOW (ref >=60)
ESTIMATED GFR - MALE: 69 mL/min/BSA (ref >=60)
GLUCOSE: 172 mg/dL — ABNORMAL HIGH (ref 65–125)
GLUCOSE: 241 mg/dL — ABNORMAL HIGH (ref 65–125)
POTASSIUM: 2.7 mmol/L — CL (ref 3.5–5.1)
POTASSIUM: 2.5 mmol/L — CL (ref 3.5–5.1)
SODIUM: 136 mmol/L (ref 136–145)
SODIUM: 135 mmol/L — ABNORMAL LOW (ref 136–145)

## 2023-01-09 LAB — LACTIC ACID - SECOND REFLEX
LACTIC ACID: 3.9 mmol/L — ABNORMAL HIGH (ref 0.5–2.2)
LACTIC ACID: 2.5 mmol/L — ABNORMAL HIGH (ref 0.5–2.2)

## 2023-01-09 LAB — ECG 12 LEAD
Calculated P Axis: 48 degrees
Calculated T Axis: 19 degrees
PR Interval: 234 ms
QTC Calculation: 406 ms
Ventricular rate: 68 {beats}/min

## 2023-01-09 LAB — LIPASE: LIPASE: 9 U/L — ABNORMAL LOW (ref 10–60)

## 2023-01-09 LAB — BETA-HYDROXYBUTYRATE, PLASMA OR SERUM: BETA-HYDROXYBUTYRATE: 0.1 mmol/L (ref <0.4)

## 2023-01-09 LAB — PT/INR
INR: 1.01 (ref 0.90–1.10)
PROTHROMBIN TIME: 11 seconds (ref 9.0–13.0)

## 2023-01-09 LAB — VENOUS BLOOD GAS
BASE EXCESS: 4.3 mmol/L — ABNORMAL HIGH (ref 0.0–2.0)
BICARBONATE (VENOUS): 26.7 mmol/L (ref 22.5–33.0)
PCO2 (VENOUS): 33 mm/Hg — ABNORMAL LOW (ref 35–54)
PH (VENOUS): 7.52 (ref 7.34–7.42)
PO2 (VENOUS): 52 mm/Hg

## 2023-01-09 LAB — LACTIC ACID LEVEL W/ REFLEX FOR LEVEL >2.0
LACTIC ACID: 2.6 mmol/L — ABNORMAL HIGH (ref 0.5–2.2)
LACTIC ACID: 3.5 mmol/L — ABNORMAL HIGH (ref 0.5–2.2)

## 2023-01-09 LAB — B-TYPE NATRIURETIC PEPTIDE (BNP),PLASMA: BNP: 11 pg/mL (ref <=99)

## 2023-01-09 LAB — PTT (PARTIAL THROMBOPLASTIN TIME): APTT: 25.3 seconds (ref 23.0–32.0)

## 2023-01-09 LAB — POTASSIUM: POTASSIUM: 3.1 mmol/L — ABNORMAL LOW (ref 3.5–5.1)

## 2023-01-09 LAB — POC BLOOD GLUCOSE (RESULTS)
GLUCOSE, POC: 229 mg/dl — ABNORMAL HIGH (ref 70–100)
GLUCOSE, POC: 202 mg/dl — ABNORMAL HIGH (ref 70–100)
GLUCOSE, POC: 145 mg/dl — ABNORMAL HIGH (ref 70–100)
GLUCOSE, POC: 160 mg/dl — ABNORMAL HIGH (ref 70–100)

## 2023-01-09 LAB — LACTIC ACID - FIRST REFLEX
LACTIC ACID: 4.1 mmol/L (ref 0.5–2.2)
LACTIC ACID: 3.2 mmol/L — ABNORMAL HIGH (ref 0.5–2.2)

## 2023-01-09 LAB — RED TOP TUBE

## 2023-01-09 LAB — MAGNESIUM: MAGNESIUM: 1.9 mg/dL (ref 1.8–2.6)

## 2023-01-09 MED ORDER — SODIUM CHLORIDE 0.9 % INTRAVENOUS PIGGYBACK
2.0000 g | INJECTION | INTRAVENOUS | Status: AC
Start: 2023-01-09 — End: 2023-01-09
  Administered 2023-01-09: 2 g via INTRAVENOUS
  Administered 2023-01-09: 0 g via INTRAVENOUS
  Filled 2023-01-09: qty 20

## 2023-01-09 MED ORDER — PREDNISONE 20 MG TABLET
40.0000 mg | ORAL_TABLET | Freq: Every morning | ORAL | Status: AC
Start: 2023-01-09 — End: 2023-01-14
  Administered 2023-01-09 – 2023-01-10 (×2): 40 mg via ORAL
  Filled 2023-01-09 (×3): qty 2

## 2023-01-09 MED ORDER — POTASSIUM BICARBONATE-CITRIC ACID 20 MEQ EFFERVESCENT TABLET
40.0000 meq | EFFERVESCENT_TABLET | ORAL | Status: AC
Start: 2023-01-09 — End: 2023-01-09
  Administered 2023-01-09: 40 meq via ORAL
  Filled 2023-01-09: qty 2

## 2023-01-09 MED ORDER — METRONIDAZOLE 500 MG TABLET
500.0000 mg | ORAL_TABLET | Freq: Two times a day (BID) | ORAL | Status: AC
Start: 2023-01-09 — End: ?
  Administered 2023-01-09 – 2023-01-10 (×2): 500 mg via ORAL
  Filled 2023-01-09 (×3): qty 1

## 2023-01-09 MED ORDER — SODIUM CHLORIDE 0.9 % (FLUSH) INJECTION SYRINGE
3.0000 mL | INJECTION | INTRAMUSCULAR | Status: DC | PRN
Start: 2023-01-09 — End: 2023-01-12

## 2023-01-09 MED ORDER — MELATONIN 5 MG TABLET
5.0000 mg | ORAL_TABLET | Freq: Every evening | ORAL | Status: DC | PRN
Start: 2023-01-09 — End: 2023-01-12
  Administered 2023-01-09 – 2023-01-11 (×2): 5 mg via ORAL
  Filled 2023-01-09 (×3): qty 1

## 2023-01-09 MED ORDER — NIRMATRELVIR 300 MG (150 MG X2)-RITONAVIR 100 MG TABLET,DOSE PACK
3.0000 | ORAL_TABLET | Freq: Two times a day (BID) | ORAL | Status: AC
Start: 2023-01-09 — End: 2023-01-14
  Administered 2023-01-09 – 2023-01-12 (×6): 3 via ORAL
  Filled 2023-01-09: qty 30

## 2023-01-09 MED ORDER — SODIUM CHLORIDE 0.9 % IV BOLUS
1000.0000 mL | INJECTION | Status: AC
Start: 2023-01-09 — End: 2023-01-09
  Administered 2023-01-09: 1000 mL via INTRAVENOUS
  Administered 2023-01-09: 0 mL via INTRAVENOUS

## 2023-01-09 MED ORDER — ONDANSETRON HCL (PF) 4 MG/2 ML INJECTION SOLUTION
4.0000 mg | Freq: Four times a day (QID) | INTRAMUSCULAR | Status: DC | PRN
Start: 2023-01-09 — End: 2023-01-12

## 2023-01-09 MED ORDER — IOPAMIDOL 370 MG IODINE/ML (76 %) INTRAVENOUS SOLUTION
100.0000 mL | INTRAVENOUS | Status: AC
Start: 2023-01-09 — End: 2023-01-09
  Administered 2023-01-09: 100 mL via INTRAVENOUS

## 2023-01-09 MED ORDER — METRONIDAZOLE 500 MG/100 ML IN SODIUM CHLOR(ISO) INTRAVENOUS PIGGYBACK
500.0000 mg | INJECTION | Freq: Two times a day (BID) | INTRAVENOUS | Status: DC
Start: 2023-01-09 — End: 2023-01-09
  Filled 2023-01-09 (×2): qty 100

## 2023-01-09 MED ORDER — ENOXAPARIN 40 MG/0.4 ML SUBCUTANEOUS SYRINGE
40.0000 mg | INJECTION | SUBCUTANEOUS | Status: AC
Start: 2023-01-09 — End: ?
  Administered 2023-01-09 – 2023-01-12 (×4): 40 mg via SUBCUTANEOUS
  Filled 2023-01-09 (×5): qty 0.4

## 2023-01-09 MED ORDER — BUDESONIDE-FORMOTEROL HFA 160 MCG-4.5 MCG/ACTUATION AEROSOL INHALER - RN
2.0000 | Freq: Two times a day (BID) | Status: DC
Start: 2023-01-09 — End: 2023-01-12
  Administered 2023-01-09 – 2023-01-12 (×6): 2 via RESPIRATORY_TRACT
  Filled 2023-01-09: qty 6

## 2023-01-09 MED ORDER — PAROXETINE 20 MG TABLET
20.0000 mg | ORAL_TABLET | Freq: Every day | ORAL | Status: DC
Start: 2023-01-09 — End: 2023-01-10
  Administered 2023-01-09 – 2023-01-10 (×2): 20 mg via ORAL
  Filled 2023-01-09 (×2): qty 1

## 2023-01-09 MED ORDER — CYCLOBENZAPRINE 10 MG TABLET
10.0000 mg | ORAL_TABLET | Freq: Every evening | ORAL | Status: DC | PRN
Start: 2023-01-09 — End: 2023-01-10
  Administered 2023-01-09: 10 mg via ORAL
  Filled 2023-01-09: qty 1

## 2023-01-09 MED ORDER — INSULIN LISPRO 100 UNIT/ML SUB-Q SSIP PEN
0.0000 [IU] | INJECTION | Freq: Four times a day (QID) | SUBCUTANEOUS | Status: DC
Start: 2023-01-09 — End: 2023-01-12
  Administered 2023-01-09: 0 [IU] via SUBCUTANEOUS
  Administered 2023-01-09: 1 [IU] via SUBCUTANEOUS
  Administered 2023-01-09 (×2): 2 [IU] via SUBCUTANEOUS
  Administered 2023-01-10: 0 [IU] via SUBCUTANEOUS
  Administered 2023-01-10 – 2023-01-11 (×4): 1 [IU] via SUBCUTANEOUS
  Administered 2023-01-11 – 2023-01-12 (×5): 0 [IU] via SUBCUTANEOUS
  Filled 2023-01-09: qty 300

## 2023-01-09 MED ORDER — MONTELUKAST 10 MG TABLET
10.0000 mg | ORAL_TABLET | Freq: Every evening | ORAL | Status: DC
Start: 2023-01-09 — End: 2023-01-10
  Administered 2023-01-09: 10 mg via ORAL
  Filled 2023-01-09 (×2): qty 1

## 2023-01-09 MED ORDER — ACETAMINOPHEN 325 MG TABLET
650.0000 mg | ORAL_TABLET | ORAL | Status: AC | PRN
Start: 2023-01-09 — End: ?
  Administered 2023-01-09 – 2023-01-12 (×8): 650 mg via ORAL
  Filled 2023-01-09 (×8): qty 2

## 2023-01-09 MED ORDER — SODIUM CHLORIDE 0.9 % INTRAVENOUS PIGGYBACK
2.0000 g | INJECTION | INTRAVENOUS | Status: AC
Start: 2023-01-09 — End: ?
  Administered 2023-01-09: 0 g via INTRAVENOUS
  Administered 2023-01-09: 2 g via INTRAVENOUS
  Filled 2023-01-09 (×2): qty 20

## 2023-01-09 MED ORDER — LOSARTAN 50 MG TABLET
100.0000 mg | ORAL_TABLET | Freq: Every day | ORAL | Status: DC
Start: 2023-01-09 — End: 2023-01-10

## 2023-01-09 MED ORDER — DEXTROSE 40 % ORAL GEL
15.0000 g | ORAL | Status: DC | PRN
Start: 2023-01-09 — End: 2023-01-12

## 2023-01-09 MED ORDER — DEXTROSE 10 % IN WATER (D10W) BOLUS
250.0000 mL | INJECTION | INTRAVENOUS | Status: DC | PRN
Start: 2023-01-09 — End: 2023-01-12

## 2023-01-09 MED ORDER — FOLIC ACID 1 MG TABLET
1.0000 mg | ORAL_TABLET | Freq: Every day | ORAL | Status: DC
Start: 2023-01-09 — End: 2023-01-10
  Administered 2023-01-09 – 2023-01-10 (×2): 1 mg via ORAL
  Filled 2023-01-09 (×2): qty 1

## 2023-01-09 MED ORDER — DEXTROSE 50 % IN WATER (D50W) INTRAVENOUS SYRINGE
12.5000 g | INJECTION | INTRAVENOUS | Status: DC | PRN
Start: 2023-01-09 — End: 2023-01-12

## 2023-01-09 MED ORDER — LACTATED RINGERS IV BOLUS
1000.0000 mL | INJECTION | Status: AC
Start: 2023-01-09 — End: 2023-01-09
  Administered 2023-01-09: 1000 mL via INTRAVENOUS
  Administered 2023-01-09: 0 mL via INTRAVENOUS

## 2023-01-09 MED ORDER — PANTOPRAZOLE 40 MG TABLET,DELAYED RELEASE
40.0000 mg | DELAYED_RELEASE_TABLET | Freq: Every morning | ORAL | Status: DC
Start: 2023-01-10 — End: 2023-01-12
  Administered 2023-01-10 – 2023-01-12 (×3): 40 mg via ORAL
  Filled 2023-01-09 (×3): qty 1

## 2023-01-09 MED ORDER — POTASSIUM CHLORIDE 10 MEQ/100ML IN STERILE WATER INTRAVENOUS PIGGYBACK
10.0000 meq | INJECTION | INTRAVENOUS | Status: DC
Start: 2023-01-09 — End: 2023-01-09

## 2023-01-09 MED ORDER — TRIAMTERENE 37.5 MG-HYDROCHLOROTHIAZIDE 25 MG CAPSULE
1.0000 | ORAL_CAPSULE | Freq: Every day | ORAL | Status: DC
Start: 2023-01-09 — End: 2023-01-10

## 2023-01-09 MED ORDER — OXYCODONE-ACETAMINOPHEN 5 MG-325 MG TABLET
2.0000 | ORAL_TABLET | Freq: Three times a day (TID) | ORAL | Status: DC | PRN
Start: 2023-01-09 — End: 2023-01-09

## 2023-01-09 MED ORDER — POTASSIUM CHLORIDE 10 MEQ/100ML IN STERILE WATER INTRAVENOUS PIGGYBACK
10.0000 meq | INJECTION | INTRAVENOUS | Status: AC
Start: 2023-01-09 — End: 2023-01-09
  Administered 2023-01-09: 10 meq via INTRAVENOUS
  Administered 2023-01-09 (×2): 0 meq via INTRAVENOUS
  Administered 2023-01-09 (×2): 10 meq via INTRAVENOUS
  Administered 2023-01-09: 0 meq via INTRAVENOUS
  Administered 2023-01-09: 10 meq via INTRAVENOUS
  Administered 2023-01-09: 0 meq via INTRAVENOUS
  Filled 2023-01-09 (×4): qty 100

## 2023-01-09 MED ORDER — POTASSIUM CHLORIDE 40 MEQ/L IN 0.9 % SODIUM CHLORIDE INTRAVENOUS
INTRAVENOUS | Status: DC
Start: 2023-01-09 — End: 2023-01-10
  Administered 2023-01-10: 0 mL/h via INTRAVENOUS
  Filled 2023-01-09: qty 1000

## 2023-01-09 MED ORDER — CHOLECALCIFEROL (VITAMIN D3) 125 MCG (5,000 UNIT) CAPSULE
5000.0000 [IU] | ORAL_CAPSULE | Freq: Every day | ORAL | Status: DC
Start: 2023-01-09 — End: 2023-01-10
  Administered 2023-01-09 – 2023-01-10 (×2): 5000 [IU] via ORAL
  Filled 2023-01-09 (×2): qty 1

## 2023-01-09 MED ORDER — IPRATROPIUM 0.5 MG-ALBUTEROL 3 MG (2.5 MG BASE)/3 ML NEBULIZATION SOLN
3.0000 mL | INHALATION_SOLUTION | Freq: Four times a day (QID) | RESPIRATORY_TRACT | Status: DC
Start: 2023-01-09 — End: 2023-01-09
  Administered 2023-01-09 (×3): 0 mL via RESPIRATORY_TRACT
  Filled 2023-01-09 (×4): qty 3

## 2023-01-09 MED ORDER — METRONIDAZOLE 500 MG/100 ML IN SODIUM CHLOR(ISO) INTRAVENOUS PIGGYBACK
500.0000 mg | INJECTION | INTRAVENOUS | Status: AC
Start: 2023-01-09 — End: 2023-01-09
  Administered 2023-01-09: 500 mg via INTRAVENOUS
  Administered 2023-01-09: 0 mg via INTRAVENOUS
  Filled 2023-01-09: qty 100

## 2023-01-09 MED ORDER — POTASSIUM CHLORIDE 10 MEQ/100ML IN STERILE WATER INTRAVENOUS PIGGYBACK
10.0000 meq | INJECTION | INTRAVENOUS | Status: AC
Start: 2023-01-09 — End: 2023-01-09
  Administered 2023-01-09: 0 meq via INTRAVENOUS
  Administered 2023-01-09 (×2): 10 meq via INTRAVENOUS
  Administered 2023-01-09: 0 meq via INTRAVENOUS
  Filled 2023-01-09 (×2): qty 100

## 2023-01-09 MED ORDER — TIOTROPIUM BROMIDE 2.5 MCG/ACTUATION MIST FOR INHALATION - RN
1.0000 | Freq: Every day | RESPIRATORY_TRACT | Status: DC
Start: 2023-01-09 — End: 2023-01-10
  Administered 2023-01-09 – 2023-01-10 (×2): 1 via RESPIRATORY_TRACT
  Filled 2023-01-09: qty 4

## 2023-01-09 MED ORDER — ELECTROLYTE-A INTRAVENOUS SOLUTION
INTRAVENOUS | Status: DC
Start: 2023-01-09 — End: 2023-01-09
  Administered 2023-01-09: 0 mL via INTRAVENOUS

## 2023-01-09 MED ORDER — POTASSIUM BICARBONATE-CITRIC ACID 20 MEQ EFFERVESCENT TABLET
40.0000 meq | EFFERVESCENT_TABLET | Freq: Two times a day (BID) | ORAL | Status: DC
Start: 2023-01-10 — End: 2023-01-12
  Administered 2023-01-10 – 2023-01-12 (×5): 40 meq via ORAL
  Filled 2023-01-09 (×6): qty 2

## 2023-01-09 MED ORDER — ALBUTEROL SULFATE 2.5 MG/3 ML (0.083 %) SOLUTION FOR NEBULIZATION
2.5000 mg | INHALATION_SOLUTION | RESPIRATORY_TRACT | Status: DC | PRN
Start: 2023-01-09 — End: 2023-01-12
  Administered 2023-01-09: 2.5 mg via RESPIRATORY_TRACT
  Filled 2023-01-09: qty 3

## 2023-01-09 MED ORDER — SODIUM CHLORIDE 0.9 % (FLUSH) INJECTION SYRINGE
3.0000 mL | INJECTION | Freq: Three times a day (TID) | INTRAMUSCULAR | Status: DC
Start: 2023-01-09 — End: 2023-01-12
  Administered 2023-01-09 (×2): 0 mL
  Administered 2023-01-09 – 2023-01-10 (×3): 3 mL
  Administered 2023-01-10: 0 mL
  Administered 2023-01-11 (×2): 3 mL
  Administered 2023-01-11 – 2023-01-12 (×2): 0 mL

## 2023-01-09 MED ORDER — POTASSIUM CHLORIDE 10 MEQ/100ML IN STERILE WATER INTRAVENOUS PIGGYBACK
10.0000 meq | INJECTION | INTRAVENOUS | Status: AC
Start: 2023-01-09 — End: 2023-01-09
  Administered 2023-01-09 (×2): 0 meq via INTRAVENOUS
  Administered 2023-01-09: 10 meq via INTRAVENOUS
  Administered 2023-01-09: 0 meq via INTRAVENOUS
  Administered 2023-01-09 (×3): 10 meq via INTRAVENOUS
  Administered 2023-01-09: 0 meq via INTRAVENOUS
  Filled 2023-01-09 (×4): qty 100

## 2023-01-09 MED ORDER — GABAPENTIN 300 MG CAPSULE
600.0000 mg | ORAL_CAPSULE | Freq: Three times a day (TID) | ORAL | Status: DC
Start: 2023-01-09 — End: 2023-01-12
  Administered 2023-01-09 – 2023-01-12 (×8): 600 mg via ORAL
  Filled 2023-01-09 (×10): qty 2

## 2023-01-09 MED ORDER — SODIUM CHLORIDE 0.9 % IV BOLUS
1000.0000 mL | INJECTION | Status: DC
Start: 2023-01-09 — End: 2023-01-09

## 2023-01-09 MED ORDER — ASPIRIN 81 MG TABLET,DELAYED RELEASE
81.0000 mg | DELAYED_RELEASE_TABLET | Freq: Every day | ORAL | Status: DC
Start: 2023-01-09 — End: 2023-01-10
  Administered 2023-01-09 – 2023-01-10 (×2): 81 mg via ORAL
  Filled 2023-01-09 (×2): qty 1

## 2023-01-09 MED ORDER — AMLODIPINE 10 MG TABLET
10.0000 mg | ORAL_TABLET | Freq: Every day | ORAL | Status: DC
Start: 2023-01-09 — End: 2023-01-10

## 2023-01-09 MED ORDER — ATORVASTATIN 80 MG TABLET
80.0000 mg | ORAL_TABLET | Freq: Every evening | ORAL | Status: DC
Start: 2023-01-09 — End: 2023-01-09
  Filled 2023-01-09: qty 1

## 2023-01-09 NOTE — H&P (Signed)
[facility]  Internal Medicine  Admission H&P    Date of Service:  01/09/2023  Steve Young, Steve Young, 62 y.o. male  Date of Admission:  01/08/2023  Date of Birth:  February 11, 1961  PCP: Peter Minium, MD    Chief Complaint:  vomiting/diarrhea    History of Present Illness:   This is a 62 year old nondiabetic male with past medical history of hypertension, HLD, and neuropathy who presents today due to fever, vomiting, and diarrhea.  Patient reports over the past several days he has had increasing shortness a breath with fever and nausea, vomiting, and diarrhea.  Reports breathing is worse with exertion.  History of asthma on inhalers at home.  He reports some slight lower abdominal discomfort.  Denies any blood in emesis or stool.  Denies any recent sick contacts.  Reports subjective fevers at home.  Denies chest pain, or dysuria.  Has no other acute complaints at this time.  Of note recently started on Ozempic.    In ED blood work significant for potassium 2 point 3, creatinine 1.5, and lactate 2.6.  UA not concerning for infection.  VBG shows no acidosis.  Chest x-ray shows no acute cardiopulmonary disease.  CT of the abdomen pelvis shows subtle inflammation and wall thickening of the distal colon at the junction of the descending colon and sigmoid colon which may indicate a mild diverticulitis or a low-grade early infectious or inflammatory colitis without evidence of bowel obstruction or perforation.  EKG shows normal sinus rhythm with first-degree AV block with no acute ischemic changes.  In ED patient was given fluid boluses, DuoNeb, Solu-Medrol, Rocephin, Flagyl.  Potassium replacement was initiated.  He will be admitted to the hospitalist service for further evaluation and treatment.    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    Hepatic lesion 10/04/2018    Hyperlipidemia     Hypertension     Hypertriglyceridemia 05/15/2022    Hypokalemia 11/18/2020    Impingement syndrome of left shoulder 12/28/2017    Metabolic dysfunction-associated steatotic liver disease (MASLD) 10/04/2018    Mild persistent asthma without complication 03/21/2018    NAFL (nonalcoholic fatty liver)     Normal cardiac stress test 03/21/2018    Nosebleed     Obesity     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    Partial tear of common extensor tendon of elbow 12/13/2017    Pituitary tumor     Pre-diabetes     Rib pain 10/04/2018    Sexual dysfunction     Tear of talofibular ligament 05/30/2013    RIGHT    Vitamin D deficiency       Past Surgical:    Past Surgical History:   Procedure Laterality Date    COLONOSCOPY      ELBOW SURGERY Left 12/2017    HX OTHER  2004     SPINAL FUSION,ANT,EA ADNL  LEVEL c3-c7    HX OTHER Right     right great toe    HX SHOULDER SURGERY Bilateral     NOSE SURGERY        Family:    Family Medical History:       Problem Relation (Age of Onset)    Cancer Mother, Sister           Social:   reports that he has never smoked. He has never used smokeless tobacco. He reports that he does not currently use alcohol. He reports that he does not use drugs.     No Known Allergies    Medications:  Medications Prior to Admission       Prescriptions    albuterol sulfate (PROVENTIL OR VENTOLIN OR PROAIR) 90 mcg/actuation Inhalation oral inhaler    inhale 1 to 2 puffs by mouth and INTO THE LUNGS every 6 hours if needed    amLODIPine (NORVASC) 10 mg Oral Tablet    take 1 tablet by mouth once daily    aspirin (ECOTRIN) 81 mg Oral Tablet, Delayed Release (E.C.)     take 1 tablet by mouth once daily    atorvastatin (LIPITOR) 80 mg Oral Tablet    take 1 tablet by mouth at bedtime    cholecalciferol, vitamin D3, 125 mcg (5,000 unit) Oral Capsule    take 1 capsule by mouth once daily    cyclobenzaprine (FLEXERIL) 10 mg Oral Tablet    Take 1 Tablet (10 mg total) by mouth Every night as needed for Muscle spasms Indications: muscle spasm    diclofenac sodium (VOLTAREN) 1 % Gel    by Apply Topically route Four times a day - before meals and bedtime    Diph,Pertuss,Acell,,Tet Vac-PF (ADACEL) 2 Lf-(2.5-5-3-5 mcg)-5Lf/0.5 mL IntraMUSCULAR Syringe    Inject 0.5 mL into the muscle One time for 1 dose    folic acid (FOLVITE) 1 mg Oral Tablet    take 1 tablet by mouth once daily    gabapentin (NEURONTIN) 600 mg Oral Tablet    Take 1 Tablet (600 mg total) by mouth Three times a day Indications: neuropathic pain    Ibuprofen (MOTRIN) 800 mg Oral Tablet    take 1 tablet by mouth three times a day if needed for pain Indications: joint damage causing pain and loss of function    losartan (COZAAR) 100 mg Oral Tablet    take 1 tablet by mouth once daily    lubiprostone (AMITIZA) 24 mcg Oral Capsule    Take 1 Cap (24 mcg total) by mouth Twice daily    magnesium oxide (MAG-OX) 400 mg (241.3 mg magnesium) Oral Tablet    Take 1 Tablet (400 mg total) by mouth    metFORMIN (GLUCOPHAGE) 500 mg Oral Tablet    Take 1 Tablet (500 mg total) by mouth Every morning before breakfast Indications: prevention of type 2 diabetes mellitus    mometasone-formoterol (DULERA) 200-5 mcg/dose Inhalation HFA Aerosol Inhaler    Take 2 Puffs by inhalation Twice daily    naloxone (NARCAN) 4 mg per spray nasal spray    for accidental overdose. use 1 spray in a nostril. may repeat in alternate nostril in 2 to 3 minutes until person is responsive or EMS arriv    omega-3 fatty acid (LOVAZA) 1 gram Oral Capsule    take 2 capsules by mouth twice a day    omeprazole (PRILOSEC) 40 mg Oral Capsule, Delayed Release(E.C.)    Take 1  Capsule (  40 mg total) by mouth Once a day Indications: gastroesophageal reflux disease, heartburn    ondansetron (ZOFRAN ODT) 4 mg Oral Tablet, Rapid Dissolve    Take 1 Tablet (4 mg total) by mouth Every 8 hours as needed for Nausea/Vomiting    oxyCODONE-acetaminophen (PERCOCET) 10-325 mg Oral Tablet    take 1 tablet by mouth three times a day if needed for pain    PARoxetine (PAXIL) 20 mg Oral Tablet    take 1 tablet by mouth once daily    semaglutide (OZEMPIC) 0.25 mg or 0.5 mg (2 mg/3 mL) Subcutaneous Pen Injector    Inject 0.25 mg under the skin Every 7 days for 28 days, THEN 0.5 mg Every 7 days for 14 days. Indications: weight loss management for an obese person.    Sildenafil (VIAGRA) 25 mg Oral Tablet    take 1 tablet by mouth every 24 hours if needed    SPIRIVA RESPIMAT 1.25 mcg/actuation Inhalation Mist    inhale 1 puff by mouth and INTO THE LUNGS once daily    triamterene-hydroCHLOROthiazide (MAXZIDE-25) 37.5-25 mg Oral Tablet    take 1 tablet by mouth every morning    zafirlukast (ACCOLATE) 20 mg Oral Tablet    take 1 tablet by mouth twice a day before meals          acetaminophen (TYLENOL) tablet, 650 mg, Oral, Q4H PRN  albuterol (PROVENTIL) 2.5 mg / 3 mL (0.083%) neb solution, 2.5 mg, Nebulization, Q4H PRN  cefTRIAXone (ROCEPHIN) 2 g in NS 100 mL IVPB minibag, 2 g, Intravenous, Q24H  electrolyte-A (PLASMALYTE-A) premix infusion, , Intravenous, Continuous  enoxaparin PF (LOVENOX) 40 mg/0.4 mL SubQ injection, 40 mg, Subcutaneous, Q24H  ipratropium-albuterol 0.5 mg-3 mg(2.5 mg base)/3 mL Solution for Nebulization, 3 mL, Nebulization, 4x/day  LR bolus infusion 1,000 mL, 1,000 mL, Intravenous, Now  metroNIDAZOLE (FLAGYL) 500 mg in NS 100 mL premix IVPB, 500 mg, Intravenous, Q12H  NS flush syringe, 3 mL, Intracatheter, Q8HRS  NS flush syringe, 3 mL, Intracatheter, Q1H PRN  ondansetron (ZOFRAN) 2 mg/mL injection, 4 mg, Intravenous, Q6H PRN  potassium chloride 10 mEq in SW 100 mL premix infusion, 10 mEq,  Intravenous, Q1H  predniSONE (DELTASONE) tablet, 40 mg, Oral, Daily with Breakfast          REVIEW OF SYSTEMS:  CONSTITUTIONAL: Patient denies any fatigue, weight loss, fever or chills.  EYES: Patient denies any double vision, blurred vision or loss of vision.  ENT: Patient denies any hearing loss, ear drainage, ear pain, noise in ears, dizziness, nasal bleeding, nasal discharge, sneezing, snoring, mouth sores, loss of taste, frequent sore throats, laryngitis or difficulty swallowing.   CARDIOVASCULAR: Patient denies any heart racing.  RESPIRATORY: Patient denies any shortness of breath, noisy breathing, wheezing asthma or cough.   NEUROLOGICAL: Patient denies any numbness, tingling, seizures or headaches.  GI: Patient denies any nausea, vomiting, indigestion, or heartburn.   GU: Patient denies any increased urinary frequency or painful urination.   ENDOCRINE: Patient denies having any brittle hair, hot or cold flashes.   SKIN: Patient denies any rashes or lesions.   MUSCULOSKELETAL: Patient denies any muscle aches or joint aches.  HEM/LYMPH: Patient denies any bleeding or easy bruising.  ALLERGIC/IMM: Patient denies any itchy eyes, ears, nose or palate, watery eyes, scratchy throat, or sneezing excessively.  PSYCHIATRIC: Patient denies any anxiety or depression.    Examination:  Temperature: 36.4 C (97.5 F) Heart Rate: 75 BP (Non-Invasive): 120/73   Respiratory Rate: (!) 7 SpO2: 97 %  Exam:  Nursing note and vitals reviewed.   General: No acute distress, alert and oriented x3  HEENT: Pharynx without no erythema, exudate. PERRLA, conjunctivae are without injection or scleral icterus  Neck: Neck supple without lymphadenopathy. Thyroid non-tender and without nodules.  Breast: Deferred exam  Cardio: Regular rate and rhythm. Normal S1 and S2. No murmurs, gallops, or rubs. PMI located in the midclavicular line.   Resp: Clear to auscultation bilaterally. No wheezes, rales, rhonchi or crackles. Normal resp  effort.  Abd: Bowel sounds present in all 4 quadrants. Negative murphy's sign. No rebound tenderness or involuntary guarding. Liver and spleen not palpable.  GU: Deferred exam  Extremities: No edema or swelling noted. Skin: No rashes, ulcers, or bruising.  Neuro: No focal deficits. CN 2-12 intact.    Lines, Drains, and Airways:  Patient Lines/Drains/Airways Status       Active Line / Dialysis Catheter / Dialysis Graft / Drain / Airway / Wound       Name Placement date Placement time Site Days    Peripheral IV Anterior;Right Dorsal Metacarpals  (top of hand) 01/08/23  2347  -- less than 1                       Labs:     Results for orders placed or performed during the hospital encounter of 01/08/23 (from the past 24 hour(s))   COVID-19 SCREENING - Asymptomatic   Result Value Ref Range    SARS-COV-2 Detected (AA) Not Detected    INFLUENZA VIRUS TYPE A Not Detected Not Detected    INFLUENZA VIRUS TYPE B Not Detected Not Detected   ECG 12 LEAD   Result Value Ref Range    Ventricular rate 68 BPM    Atrial Rate 68 BPM    PR Interval 234 ms    QRS Duration 94 ms    QT Interval 382 ms    QTC Calculation 406 ms    Calculated P Axis 48 degrees    Calculated R Axis 43 degrees    Calculated T Axis 19 degrees   B-TYPE NATRIURETIC PEPTIDE   Result Value Ref Range    BNP 11 <=99 pg/mL   COMPREHENSIVE METABOLIC PANEL, NON-FASTING   Result Value Ref Range    SODIUM 138 136 - 145 mmol/L    POTASSIUM 2.3 (LL) 3.5 - 5.1 mmol/L    CHLORIDE 102 96 - 111 mmol/L    CO2 TOTAL 22 (L) 23 - 31 mmol/L    ANION GAP 14 (H) 4 - 13 mmol/L    BUN 13 8 - 25 mg/dL    CREATININE 5.78 (H) 0.75 - 1.35 mg/dL    BUN/CREA RATIO 9 6 - 22    ALBUMIN 3.8 3.4 - 4.8 g/dL     CALCIUM 9.4 8.6 - 46.9 mg/dL    GLUCOSE 629 65 - 528 mg/dL    ALKALINE PHOSPHATASE 69 45 - 115 U/L    ALT (SGPT) 21 10 - 55 U/L    AST (SGOT)  19 8 - 45 U/L    BILIRUBIN TOTAL 0.8 0.3 - 1.3 mg/dL    PROTEIN TOTAL 6.9 6.0 - 8.0 g/dL    ESTIMATED GFR - MALE 52 (L) >=60 mL/min/BSA   LACTIC  ACID LEVEL W/ REFLEX FOR LEVEL >2.0   Result Value Ref Range    LACTIC ACID 2.6 (H) 0.5 - 2.2 mmol/L   LIPASE   Result Value Ref Range    LIPASE 9 (  L) 10 - 60 U/L   MAGNESIUM   Result Value Ref Range    MAGNESIUM 1.9 1.8 - 2.6 mg/dL   PT/INR   Result Value Ref Range    PROTHROMBIN TIME 11.0 9.0 - 13.0 seconds    INR 1.01 0.90 - 1.10   PTT (PARTIAL THROMBOPLASTIN TIME)   Result Value Ref Range    APTT 25.3 23.0 - 32.0 seconds   TROPONIN-I   Result Value Ref Range    TROPONIN-I HS 7.9 <=35.0 ng/L ng/L   CBC WITH DIFF   Result Value Ref Range    WBC 7.4 3.7 - 11.0 x10^3/uL    RBC 4.59 4.50 - 6.10 x10^6/uL    HGB 13.4 13.4 - 17.5 g/dL    HCT 74.1 (L) 28.7 - 52.0 %    MCV 80.8 78.0 - 100.0 fL    MCH 29.2 26.0 - 32.0 pg    MCHC 36.1 (H) 31.0 - 35.5 g/dL    RDW-CV 86.7 67.2 - 09.4 %    PLATELETS 233 150 - 400 x10^3/uL    MPV 10.9 8.7 - 12.5 fL    NEUTROPHIL % 57.0 %    LYMPHOCYTE % 32.0 %    MONOCYTE % 10.0 %    EOSINOPHIL % 0.0 %    BASOPHIL % 1.0 %    NEUTROPHIL # 4.22 1.50 - 7.70 x10^3/uL    LYMPHOCYTE # 2.39 1.00 - 4.80 x10^3/uL    MONOCYTE # 0.71 0.20 - 1.10 x10^3/uL    EOSINOPHIL # <0.10 <=0.50 x10^3/uL    BASOPHIL # <0.10 <=0.20 x10^3/uL    IMMATURE GRANULOCYTE % 0.0 0.0 - 1.0 %    IMMATURE GRANULOCYTE # <0.10 <0.10 x10^3/uL   VENOUS BLOOD GAS   Result Value Ref Range    BICARBONATE (VENOUS) 26.7 22.5 - 33.0 mmol/L    PCO2 (VENOUS) 33 (L) 35 - 54 mm/Hg    PH (VENOUS) 7.52 (HH) 7.34 - 7.42    PO2 (VENOUS) 52 mm/Hg    BASE EXCESS 4.3 (H) 0.0 - 2.0 mmol/L    O2 SATURATION (VENOUS)      %FIO2 (VENOUS)     BETA-HYDROXYBUTYRATE, PLASMA OR SERUM   Result Value Ref Range    BETA-HYDROXYBUTYRATE 0.1 <0.4 mmol/L   RED TOP TUBE   Result Value Ref Range    RAINBOW/EXTRA TUBE AUTO RESULT Yes    URINALYSIS, MACRO/MICRO   Result Value Ref Range    COLOR Dark Yellow (A) Yellow    APPEARANCE Clear Clear    SPECIFIC GRAVITY 1.023 1.001 - 1.030    PH 6.5 5.0 - 9.0    PROTEIN 30 (A) Negative mg/dL    GLUCOSE Negative Negative mg/dL     KETONES Negative Negative mg/dL    UROBILINOGEN 1.0 0.2 - 1.0 mg/dL    BILIRUBIN Negative Negative mg/dL    BLOOD Negative Negative mg/dL    NITRITE Negative Negative    LEUKOCYTES Negative Negative WBCs/uL   URINALYSIS, MICROSCOPIC   Result Value Ref Range    SQUAMOUS EPITHELIAL Few (A) None /hpf    WBCS 1-3 (A) None /hpf    RBCS 0-2 (A) None /hpf    BACTERIA Negative Negative /hpf    HYALINE CASTS 10-20 (A) None /lpf   TROPONIN-I   Result Value Ref Range    TROPONIN-I HS 7.1 <=35.0 ng/L ng/L   RAPID THROAT SCREEN, STREPTOCOCCUS, WITH REFLEX    Specimen: Throat; Swab   Result Value Ref Range    THROAT  RAPID SCREEN, STREPTOCOCCUS Negative Negative   LACTIC ACID - FIRST REFLEX   Result Value Ref Range    LACTIC ACID 4.1 (HH) 0.5 - 2.2 mmol/L   BASIC METABOLIC PANEL   Result Value Ref Range    SODIUM 136 136 - 145 mmol/L    POTASSIUM 2.7 (LL) 3.5 - 5.1 mmol/L    CHLORIDE 101 96 - 111 mmol/L    CO2 TOTAL 19 (L) 23 - 31 mmol/L    ANION GAP 16 (H) 4 - 13 mmol/L    CALCIUM 9.3 8.6 - 10.3 mg/dL    GLUCOSE 696 (H) 65 - 125 mg/dL    BUN 13 8 - 25 mg/dL    CREATININE 2.95 (H) 0.75 - 1.35 mg/dL    BUN/CREA RATIO 9 6 - 22    ESTIMATED GFR - MALE 52 (L) >=60 mL/min/BSA      CBC:     7.4 (08/24 2346) \   13.4 (08/24 2346) /   233 (08/24 2346)      / 37.1* (08/24 2346) \           BMP:   136 (08/25 0351) 101 (08/25 0351) 13 (08/25 0351)    /         2.7* (08/25 0351) 19* (08/25 0351) 1.50* (08/25 0351) \                Imaging Studies:    Results for orders placed or performed during the hospital encounter of 01/08/23 (from the past 24 hour(s))   XR AP MOBILE CHEST     Status: None    Narrative    INDICATION:  Shortness of breath, chest pain, cough, fever.      TECHNIQUE:  Frontal chest was obtained at 23:35 hours.    COMPARISON:  XR chest 08/18/2020, 03/14/2018.  CT chest/abdomen/pelvis 05/30/2022.     FINDINGS:    The cardiomediastinal silhouette is normal in size.  There is no consolidation or atelectasis in either lung.  There  are no pleural effusions.  There is no pneumothorax.  No acute osseous process.  Cervical fusion hardware is noted.      Impression    No acute cardiopulmonary disease.        Signed by Arva Chafe   CT ABDOMEN PELVIS W IV CONTRAST     Status: None    Narrative    INDICATION:  Left lower quadrant abdominal pain with fever, nausea, vomiting and diarrhea.    TECHNIQUE:  CT of the abdomen and pelvis was performed with intravenous contrast.  Isovue 370 100 mL was administered intravenously.      Automated mA/kV exposure control was utilized and patient examination was performed in strict accordance with principles of ALARA.    RADIATION AMOUNT:  658.40 mGy-cm.    COMPARISON:  [place] right upper quadrant 12/09/2022, CT chest, abdomen and pelvis 05/30/2022, [place] right upper quadrant 04/04/2021, US abdomen 10/03/2018.    FINDINGS:   Abdomen:  The lung bases are clear.  Cardiac size is normal.    Gallbladder is unremarkable.  The liver is slightly low in attenuation, suggesting mild chronic steatosis.  The pancreas, spleen, adrenal glands, and kidneys demonstrate no acute pathology.  Moderate fatty atrophy of the pancreas is noted.    There is no free air or lymph node enlargement.  Abdominal aorta is not aneurysmal.  Mild calcified atheromatous plaque is seen in the abdominal aorta and iliac arteries.    Pelvis:  There  is no bowel wall thickening or obstruction.  Appendix appears normal.  Terminal ileum is unremarkable.  Diverticulosis is present in the descending colon and sigmoid colon.  There is subtle wall thickening of a short segment of the distal colon at the junction of the descending colon and sigmoid colon.  Minimal surrounding fat stranding is present.  There is no free fluid.  Lymph nodes are not enlarged.  Urinary bladder is unremarkable.    Skeleton:    There are no acute fractures.  No suspicious bony lesions.      Impression    Subtle inflammation and wall thickening of the distal colon at the junction of the  descending colon and sigmoid colon which may indicate a mild diverticulitis or a low-grade early infectious or inflammatory colitis.  No evidence of bowel obstruction or perforation.  Moderate pancreatic atrophy.  Hepatic steatosis.      Signed by Arva Chafe              Assessment/Plan:   Active Hospital Problems    Diagnosis    Primary Problem: Diverticulitis    Elevated lactic acid level    Asthma exacerbation    COVID-19    Hypokalemia     Diverticulitis versus colitis   -left lower quadrant pain with diarrhea and vomiting.  CT showing wall thickening of the distal colon indicating mild diverticulitis or low-grade early infectious inflammatory colitis.  -continue IV Rocephin and Flagyl   -check stool culture, O&P, and C diff   -consult GI and appreciate their further recommendations   -full liquid diet    Elevated lactic acid   -initial lactate 2.6 in the setting of diverticulitis and COVID-19.  Given IV fluids and on repeat lactate is 4.1.  Suspect possible hemolyzed specimen as patient was nontoxic-appearing without acute abdominal findings other than diverticulitis and colitis.  -remains hemodynamically stable   -given other 1 L fluid bolus.  Repeat lactate pending.    Asthma exacerbation   -wheezing on exam.  No hypoxia.  Recent normal PFTs.    -scheduled DuoNeb, p.r.n. albuterol, and short dose oral steroids   -aggressive pulmonary hygiene    Hypokalemia   -presented with a potassium of 2.3 and was given supplementation in the ED.  On repeat potassium 2.7   -currently receiving 4 runs IV potassium   -repeat potassium level scheduled for 1300    AKI   -creatinine is 1.5 which is up from his baseline of 1.0 in the setting of acute GI loss with diarrhea and vomiting   -continue aggressive fluid resuscitation   -repeat renal function in a.m.   -renally dose medications and avoid further nephrotoxic agents    COVID-19   -found to be COVID positive.  No hypoxia or pneumonia   -continue supportive measures    -isolation precautions    Hypertension   -resume home meds    Neuropathy   -resume home gabapentin    Prediabetes   -on metformin and Ozempic from PCP.  Hold these meds and place patient on low-dose sliding scale insulin and Accu-Cheks q.i.d. a.c. HS while hospitalized.  -hypoglycemia protocol            DVT/GI Prophylaxis: Lovenox SQ    CODE Status: FULL CODE    Disposition Planning: Full admission as I anticipate greater than 2 midnights necessary to treat above diagnoses      The patient was discussed and evaluated with Dr. Leticia Clas. Note will be forwarded for review and  further recommendations.        Braulio Conte, NP

## 2023-01-09 NOTE — Nurses Notes (Signed)
 Received patient from the ED.   [place], RN

## 2023-01-09 NOTE — ED Attending Note (Signed)
[facility]  Primary Attending Note  I was physically present and directly supervised this patients care. Patient seen and examined with the resident/APP and history/exam reviewed. Key elements in addition to and/or correction of that documentation is below.    HPI/ED COURSE/MDM:     Steve Young, date of birth 06/04/1960, is a 62 y.o. male who presents to the Emergency Department with a CC of Generalized Body Aches and Vomiting.    5 days of nausea, vomiting, diarrhea.  Found to be COVID positive.  Also recently initiated on Ozempic.  Having significant hypokalemia and AKI.  He was in the emergency department for 6 hours with fluid resuscitation and replaced it up potassium but still has a AKI and significant hypokalemia.  Will require admission for further correction. CT with non-specific colitis, likely viral, but was given abx in the interim.  Further historical details can be found in the resident/APP note.      Medical Decision Making  Problems Addressed:  COVID-19: acute illness or injury with systemic symptoms  Elevated lactic acid level: acute illness or injury  Hypokalemia: acute illness or injury with systemic symptoms that poses a threat to life or bodily functions    Amount and/or Complexity of Data Reviewed  Labs: ordered. Decision-making details documented in ED Course.  Radiology: ordered and independent interpretation performed.  ECG/medicine tests: ordered and independent interpretation performed. Decision-making details documented in ED Course.  Discussion of management or test interpretation with external provider(s): APP to discuss with Dr. Leticia Clas    Risk  OTC drugs.  Prescription drug management.  Decision regarding hospitalization.        Medications given during ED stay include:  Medications Administered in the ED   NS bolus infusion 1,000 mL (has no administration in time range)   potassium chloride 10 mEq in SW 100 mL premix infusion (has no administration in  time range)   methylPREDNISolone sod succ (SOLU-medrol) 125 mg/2 mL injection (125 mg Intravenous Given 01/08/23 2347)   ipratropium-albuterol 0.5 mg-3 mg(2.5 mg base)/3 mL Solution for Nebulization (3 mL Nebulization Given 01/09/23 0007)   acetaminophen (TYLENOL) tablet (975 mg Oral Given 01/08/23 2347)   ondansetron (ZOFRAN) 2 mg/mL injection (4 mg Intravenous Given 01/08/23 2347)   NS bolus infusion 1,000 mL (0 mL Intravenous Stopped 01/09/23 0151)   potassium chloride 10 mEq in SW 100 mL premix infusion (0 mEq Intravenous Stopped 01/09/23 0323)   potassium bicarbonate-citric acid (EFFER-K) effervescent tablet (40 mEq Oral Given 01/09/23 0121)   iopamidol (ISOVUE-370) 76% infusion (100 mL Intravenous Given 01/09/23 0105)   cefTRIAXone (ROCEPHIN) 2 g in NS 100 mL IVPB minibag (0 g Intravenous Stopped 01/09/23 0252)   metroNIDAZOLE (FLAGYL) 500 mg in NS 100 mL premix IVPB (500 mg Intravenous New Bag/New Syringe 01/09/23 0352)   NS bolus infusion 1,000 mL (0 mL Intravenous Stopped 01/09/23 0252)       IMPRESSION AND DISPOSITION     Clinical Impression:   Clinical Impression   COVID-19 (Primary)   Hypokalemia   Elevated lactic acid level       Disposition: Admitted    PHYSICAL EXAM:     ED Triage Vitals [01/08/23 2159]   BP (Non-Invasive) 115/81   Heart Rate 79   Respiratory Rate 20   Temperature 36.4 C (97.5 F)   SpO2 98 %   Weight 99.8 kg (220 lb)   Height 1.753 m (5\' 9" )     Physical Exam  Vitals and nursing  note reviewed.   Constitutional:       General: He is not in acute distress.     Appearance: He is well-developed.   HENT:      Head: Normocephalic and atraumatic.      Mouth/Throat:      Mouth: Mucous membranes are dry.   Eyes:      Conjunctiva/sclera: Conjunctivae normal.   Neck:      Trachea: No tracheal deviation.   Cardiovascular:      Rate and Rhythm: Normal rate and regular rhythm.      Heart sounds: Normal heart sounds.   Pulmonary:      Effort: Pulmonary effort is normal. No respiratory distress.      Breath  sounds: Normal breath sounds. No wheezing or rales.   Abdominal:      General: There is distension.      Palpations: Abdomen is soft.      Tenderness: There is abdominal tenderness. There is no guarding or rebound.      Comments: Mild tenderness without rebound or guarding   Musculoskeletal:         General: No deformity. Normal range of motion.      Cervical back: No rigidity.   Skin:     General: Skin is warm and dry.      Capillary Refill: Capillary refill takes less than 2 seconds.      Findings: No rash.   Neurological:      Mental Status: He is alert and oriented to person, place, and time.   Psychiatric:         Behavior: Behavior normal.           DIAGNOSTICS   I have personally reviewed all laboratory and imaging results for this patient.  Results are listed below.    Labs:    Labs Ordered/Reviewed   COVID-19 [place] MOLECULAR LAB TESTING - Abnormal; Notable for the following components:       Result Value    SARS-COV-2 Detected (*)     All other components within normal limits    Narrative:     Results are for the simultaneous qualitative identification of SARS-CoV-2 (formerly 2019-nCoV), Influenza A and Influenza B RNA. These etiologic agents are generally detectable in nasopharyngeal and nasal swabs during the ACUTE PHASE of infection. Hence, this test is intended to be performed on respiratory specimens collected from individuals with signs and symptoms of upper respiratory tract infection who meet Centers for Disease Control and Prevention (CDC) clinical and/or epidemiological criteria for Coronavirus Disease 2019 (COVID-19) testing. CDC COVID-19 criteria for testing on human specimens is available at [facility] webpage information for Healthcare Professionals: Coronavirus Disease 2019 (COVID-19) ([email or web address]).     False-negative results may occur if the virus has genomic mutations, insertions, deletions, or rearrangements or if performed very early in the course of  illness. Otherwise, negative results indicate virus specific RNA targets are not detected; however, negative results do not preclude SARS-CoV-2 (COVID-19) infection or Influenza A or B infection. Results should not be used as the sole basis for patient management decisions. Negative results must be combined with clinical observations, patient history, and epidemiological information. If upper respiratory tract infection is still suspected based on exposure history together with other clinical findings, repeat testing should be considered.     Disclaimer:   The impacts of vaccines, antiviral therapeutics, antibiotics, chemotherapeutic or immunosuppressant drugs have not been evaluated.     Test methodology:   Cobas SARS-CoV-2 and Influenza A/B  Assay - real-time reverse transcriptase polymerase chain reaction (RT-PCR) test on the cobas LIAT analyzer [employer]).     Cobas SARS-CoV-2 & Influenza A/B assay uses real-time reverse transcriptase polymerase chain reaction (RT-PCR) technology to rapidly (approximately 20 minutes) detect and differentiate between SARS-CoV-2, influenza A, and influenza B viruses from nasopharyngeal and nasal swabs.     COMPREHENSIVE METABOLIC PANEL, NON-FASTING - Abnormal; Notable for the following components:    POTASSIUM 2.3 (*)     CO2 TOTAL 22 (*)     ANION GAP 14 (*)     CREATININE 1.51 (*)     ESTIMATED GFR - MALE 52 (*)     All other components within normal limits   LACTIC ACID LEVEL W/ REFLEX FOR LEVEL >2.0 - Abnormal; Notable for the following components:    LACTIC ACID 2.6 (*)     All other components within normal limits   LIPASE - Abnormal; Notable for the following components:    LIPASE 9 (*)     All other components within normal limits   CBC WITH DIFF - Abnormal; Notable for the following components:    HCT 37.1 (*)     MCHC 36.1 (*)     All other components within normal limits   URINALYSIS, MACRO/MICRO - Abnormal; Notable for the following components:    COLOR Dark  Yellow (*)     PROTEIN 30 (*)     All other components within normal limits   VENOUS BLOOD GAS - Abnormal; Notable for the following components:    PCO2 (VENOUS) 33 (*)     PH (VENOUS) 7.52 (*)     BASE EXCESS 4.3 (*)     All other components within normal limits   URINALYSIS, MICROSCOPIC - Abnormal; Notable for the following components:    SQUAMOUS EPITHELIAL Few (*)     WBCS 1-3 (*)     RBCS 0-2 (*)     HYALINE CASTS 10-20 (*)     All other components within normal limits   LACTIC ACID - FIRST REFLEX - Abnormal; Notable for the following components:    LACTIC ACID 4.1 (*)     All other components within normal limits   BASIC METABOLIC PANEL - Abnormal; Notable for the following components:    POTASSIUM 2.7 (*)     CO2 TOTAL 19 (*)     ANION GAP 16 (*)     GLUCOSE 172 (*)     CREATININE 1.50 (*)     ESTIMATED GFR - MALE 52 (*)     All other components within normal limits   RAPID THROAT SCREEN, STREPTOCOCCUS, WITH REFLEX - Normal   B-TYPE NATRIURETIC PEPTIDE (BNP),PLASMA - Normal   MAGNESIUM - Normal   PT/INR - Normal    Narrative:     INR Reference Range: 0.9-1.1  Moderate Intensity Warfarin Therapy: 2.0-3.0  Higher Intensity Warfarin Therapy:  2.5-3.5   PTT (PARTIAL THROMBOPLASTIN TIME) - Normal    Narrative:     Therapeutic range for unfractionated heparin is 42-62 seconds.       TROPONIN-I - Normal   BETA-HYDROXYBUTYRATE, PLASMA OR SERUM - Normal   TROPONIN-I - Normal   GROUP A STREP CULTURE ([facility] ONLY)   CBC/DIFF    Narrative:     The following orders were created for panel order CBC/DIFF.  Procedure  Abnormality         Status                     ---------                               -----------         ------                     CBC WITH VQQV[[id number]]                Abnormal            Final result                 Please view results for these tests on the individual orders.   URINALYSIS WITH REFLEX MICROSCOPIC AND CULTURE IF POSITIVE    Narrative:     The following orders  were created for panel order URINALYSIS WITH REFLEX MICROSCOPIC AND CULTURE IF POSITIVE.  Procedure                               Abnormality         Status                     ---------                               -----------         ------                     URINALYSIS, MACRO/MICRO[[id number]]      Abnormal            Final result               URINALYSIS, MICROSCOPIC[[id number]]      Abnormal            Final result                 Please view results for these tests on the individual orders.   EXTRA TUBES    Narrative:     The following orders were created for panel order EXTRA TUBES.  Procedure                               Abnormality         Status                     ---------                               -----------         ------                     RED TOP PPIR[[id number]]                                     Final result                 Please view results for these tests on the individual  orders.   RED TOP TUBE   LACTIC ACID - SECOND REFLEX         Radiology:    CT ABDOMEN PELVIS W IV CONTRAST   Final Result by Edi, Radresults In (08/25 0133)   Subtle inflammation and wall thickening of the distal colon at the junction of the descending colon and sigmoid colon which may indicate a mild diverticulitis or a low-grade early infectious or inflammatory colitis.  No evidence of bowel obstruction or perforation.   Moderate pancreatic atrophy.   Hepatic steatosis.         Signed by Arva Chafe      XR AP MOBILE CHEST   Final Result by Edi, Radresults In (08/25 0034)   No acute cardiopulmonary disease.           Signed by Arva Chafe          EKG:  Reviewed and interpreted by ED physician  Most Recent EKG This Encounter   ECG 12 LEAD    Collection Time: 01/08/23 11:17 PM   Result Value    Ventricular rate 68    Atrial Rate 68    PR Interval 234    QRS Duration 94    QT Interval 382    QTC Calculation 406    Calculated P Axis 48    Calculated R Axis 43    Calculated T Axis 19    Narrative    Sinus rhythm with  1st degree AV block  no acute ischemic changes  AV block seen previously  Confirmed by Darrick Huntsman ([id number]) on 01/09/2023 1:09:51 AM         // Darrick Huntsman, MD 01/09/2023 04:55  Department of Emergency Medicine  [facility]    Parts of this patients chart were completed in a retrospective fashion due to simultaneous direct patient care activities in the Emergency Department. This note was partially generated using MModal Fluency Direct System, and there may be some incorrect words, spellings, and punctuation that were not noted in proofreading the note prior to saving.

## 2023-01-09 NOTE — ED Notes (Signed)
Notified Dr. Happe of the GI consult.

## 2023-01-09 NOTE — ED Nurses Note (Signed)
Report called to [facility] on 2E

## 2023-01-09 NOTE — ED Nurses Note (Signed)
Patient placed in hospital bed

## 2023-01-09 NOTE — Care Plan (Signed)
Progress Note Update    This is a 62 year old male patient who presented to the ER on 08/24 for chief complaint N/V/D and shortness of breath. Significant PMH includes Asthma, T2DM, HTN, HLD, GERD, MASLD, Obesity. Patient found to have COVID-19 Infection with concern for underlying asthma exacerbation and infectious colitis with severe dehydration. Treatment ongoing as below. Anticipate to be medically ready for discharge in 1-2 days.    COVID-19 Infection  Asthma Exacerbation?  -stable on RA  -VBG consistent with respiratory alkalosis, not compensated  -COVID-19 positive on 8/24. Continue enhanced droplet precautions for 10 days  -Blood cultures ordered and pending  -CT Chest w/ IV Contrast showed normal lungs  -PFT's on 8/19 were normal with no bronchodilator response   -continue Prednisone 40 mg daily X 5 days  -start Paxlovid  -continue home inhalers Symbicort, Spiriva  -continue home Singulair  -aggressive pulmonary toilet, nebs PRN  -follow-up outpatient with [facility]    Colitis  IBS-C  GERD  -multiple days of N/V/D  -most likely viral 2/2 COVID, patient also just recently started Ozempic   -CT AP showed thickening distal colon junction descending and sigmoid colon mild diverticulitis vs colitis, moderate pancreatic atrophy, hepatic steatosis  -C. Diff and GI Biofire ordered  -continue IV fluid hydration for 24 hrs  -continue IV Ceftriaxone and Flagyl for now, deescalate based on stool testing or discontinue if diarrhea resolved  -continue PPI  -hold home Lubiprostone  -nausea medication PRN. Avoid antidiarrheals until bacterial infection ruled out  -GI consulted. Appreciate assistance.    AKI  Lactic Acidosis 2/2 Dehydration  -prerenal  -Cr elevated 1.51 on admission  -Cr baseline appears to be 1.1  -UA positive 30 protein  -continue IVF hydration as above  -monitor I&O. Avoid nephrotoxins. Renal adjust medications    Severe Hypokalemia  -2/2 GI symptoms  -continue aggressive electrolyte  replacement  -maintain telemetry    Chronic Pain  Mood Disorder  -resume home Percocet PRN (PDMP reviewed) and Flexeril PRN  -continue Gabapentin  -continue Paxil  -hold home Voltaren gel and Ibuprofen    T2DM  -hold Metformin and Ozempic    HTN  -hold amlodipine, losartan, triamterene-HCTZ for hypotension    HLD  -continue Lipitor  -home Lovaza not on formulary    Continue home Aspirin, uncertain whether being used for primary or secondary prevention. Continue home Vitamin D3 and Folic Acid.    DVT Prophylaxis: Lovenox  Full Code  Disposition: Home    Corinna Lines, MD

## 2023-01-10 ENCOUNTER — Encounter (HOSPITAL_COMMUNITY): Payer: Self-pay | Admitting: Family Medicine

## 2023-01-10 ENCOUNTER — Telehealth (INDEPENDENT_AMBULATORY_CARE_PROVIDER_SITE_OTHER): Payer: Self-pay | Admitting: Family Medicine

## 2023-01-10 LAB — CBC WITH DIFF
BASOPHIL #: <0.1 10*3/uL (ref <=0.20)
BASOPHIL %: 0 %
EOSINOPHIL #: <0.1 10*3/uL (ref <=0.50)
EOSINOPHIL %: 0 %
HCT: 32.8 % — ABNORMAL LOW (ref 38.9–52.0)
HGB: 11.7 g/dL — ABNORMAL LOW (ref 13.4–17.5)
IMMATURE GRANULOCYTE #: <0.1 10*3/uL (ref <0.10)
IMMATURE GRANULOCYTE %: 1 % (ref 0.0–1.0)
LYMPHOCYTE #: 1.79 10*3/uL (ref 1.00–4.80)
LYMPHOCYTE %: 13 %
MCH: 29.5 pg (ref 26.0–32.0)
MCHC: 35.7 g/dL — ABNORMAL HIGH (ref 31.0–35.5)
MCV: 82.6 fL (ref 78.0–100.0)
MONOCYTE #: 0.87 10*3/uL (ref 0.20–1.10)
MONOCYTE %: 7 %
MPV: 10.9 fL (ref 8.7–12.5)
NEUTROPHIL #: 10.61 10*3/uL — ABNORMAL HIGH (ref 1.50–7.70)
NEUTROPHIL %: 79 %
PLATELETS: 214 10*3/uL (ref 150–400)
RBC: 3.97 10*6/uL — ABNORMAL LOW (ref 4.50–6.10)
RDW-CV: 14.6 % (ref 11.5–15.5)
WBC: 13.4 10*3/uL — ABNORMAL HIGH (ref 3.7–11.0)

## 2023-01-10 LAB — BASIC METABOLIC PANEL
ANION GAP: 8 mmol/L (ref 4–13)
ANION GAP: 10 mmol/L (ref 4–13)
BUN/CREA RATIO: 13 (ref 6–22)
BUN/CREA RATIO: 13 (ref 6–22)
BUN: 12 mg/dL (ref 8–25)
BUN: 14 mg/dL (ref 8–25)
CALCIUM: 8.6 mg/dL (ref 8.6–10.3)
CALCIUM: 8.6 mg/dL (ref 8.6–10.3)
CHLORIDE: 106 mmol/L (ref 96–111)
CHLORIDE: 107 mmol/L (ref 96–111)
CO2 TOTAL: 26 mmol/L (ref 23–31)
CO2 TOTAL: 23 mmol/L (ref 23–31)
CREATININE: 0.94 mg/dL (ref 0.75–1.35)
CREATININE: 1.06 mg/dL (ref 0.75–1.35)
ESTIMATED GFR - MALE: >90 mL/min/BSA (ref >=60)
ESTIMATED GFR - MALE: 79 mL/min/BSA (ref >=60)
GLUCOSE: 142 mg/dL — ABNORMAL HIGH (ref 65–125)
GLUCOSE: 172 mg/dL — ABNORMAL HIGH (ref 65–125)
POTASSIUM: 3 mmol/L — ABNORMAL LOW (ref 3.5–5.1)
POTASSIUM: 3.1 mmol/L — ABNORMAL LOW (ref 3.5–5.1)
SODIUM: 140 mmol/L (ref 136–145)
SODIUM: 140 mmol/L (ref 136–145)

## 2023-01-10 LAB — MAGNESIUM: MAGNESIUM: 2.1 mg/dL (ref 1.8–2.6)

## 2023-01-10 LAB — POC BLOOD GLUCOSE (RESULTS)
GLUCOSE, POC: 157 mg/dl — ABNORMAL HIGH (ref 70–100)
GLUCOSE, POC: 146 mg/dl — ABNORMAL HIGH (ref 70–100)
GLUCOSE, POC: 168 mg/dl — ABNORMAL HIGH (ref 70–100)
GLUCOSE, POC: 165 mg/dl — ABNORMAL HIGH (ref 70–100)

## 2023-01-10 LAB — GROUP A STREP CULTURE (UTN ONLY)

## 2023-01-10 LAB — PHOSPHORUS: PHOSPHORUS: 3.1 mg/dL (ref 2.3–4.0)

## 2023-01-10 NOTE — Progress Notes (Addendum)
[facility]  Internal Medicine Progress Note  Full Code    Name: Steve Young    Date of service: 01/10/2023    Chief Complaint: Generalized Body Aches and Vomiting     Subjective   Patient states that he is still not feeling well today, continuing to have nausea abdominal pain and shortness of breath. No further episodes of diarrhea. Does not feel well enough to return home.    No Known Allergies    Objective     Vital Signs:  Blood pressure 116/67, pulse 76, temperature 36.5 C (97.7 F), resp. rate 18, height 1.753 m (5\' 9" ), weight 99.8 kg (220 lb), SpO2 91%.     Physical Exam:  Nursing note and vitals reviewed.  General: 62 y.o. male, appears stated age, in no acute distress, laying comfortably in bed  Eyes: PERRLA. EOM intact. Clear conjunctivae.  HENT: NCAT. Oral and nasal mucosa moist without erythema or exudate.   Neck: Soft and non-tender. No lymphadenopathy.  CV: Regular rate and rhythm without murmurs  Lungs: Clear to auscultation bilaterally. No wheezes, crackles or rhonchi. Comfortable work of breathing on RA.  Abdomen: Soft, non-tender, non-distended, bowel sounds present  Neuro: Alert and answers questions appropriately. No apparent focal neurological deficits.  Musculoskeletal: Grossly normal limb movements without obvious deformity  Extremities: No peripheral edema  Skin:Warm and dry. No visible rashes or lesions.   Psych: Behavior, speech and affect are appropriate.     Lines, Drains, and Airways:  Patient Lines/Drains/Airways Status       Active Line / Dialysis Catheter / Dialysis Graft / Drain / Airway / Wound       Name Placement date Placement time Site Days    Peripheral IV Right Median Cubital  (antecubital fossa) 01/09/23  1031  -- 1                  Labs:  Results for orders placed or performed during the hospital encounter of 01/08/23 (from the past 24 hour(s))   LACTIC ACID - FIRST REFLEX   Result Value Ref Range    LACTIC ACID 3.2 (H) 0.5 - 2.2 mmol/L   POC BLOOD GLUCOSE  (RESULTS)   Result Value Ref Range    GLUCOSE, POC 160 (H) 70 - 100 mg/dl   LACTIC ACID - SECOND REFLEX   Result Value Ref Range    LACTIC ACID 2.5 (H) 0.5 - 2.2 mmol/L   PHOSPHORUS   Result Value Ref Range    PHOSPHORUS 3.1 2.3 - 4.0 mg/dL   MAGNESIUM   Result Value Ref Range    MAGNESIUM 2.1 1.8 - 2.6 mg/dL   BASIC METABOLIC PANEL   Result Value Ref Range    SODIUM 140 136 - 145 mmol/L    POTASSIUM 3.0 (L) 3.5 - 5.1 mmol/L    CHLORIDE 106 96 - 111 mmol/L    CO2 TOTAL 26 23 - 31 mmol/L    ANION GAP 8 4 - 13 mmol/L    CALCIUM 8.6 8.6 - 10.3 mg/dL    GLUCOSE 132 (H) 65 - 125 mg/dL    BUN 12 8 - 25 mg/dL    CREATININE 4.40 1.02 - 1.35 mg/dL    BUN/CREA RATIO 13 6 - 22    ESTIMATED GFR - MALE >90 >=60 mL/min/BSA   CBC WITH DIFF   Result Value Ref Range    WBC 13.4 (H) 3.7 - 11.0 x10^3/uL    RBC 3.97 (L) 4.50 - 6.10 x10^6/uL  HGB 11.7 (L) 13.4 - 17.5 g/dL    HCT 11.9 (L) 14.7 - 52.0 %    MCV 82.6 78.0 - 100.0 fL    MCH 29.5 26.0 - 32.0 pg    MCHC 35.7 (H) 31.0 - 35.5 g/dL    RDW-CV 82.9 56.2 - 13.0 %    PLATELETS 214 150 - 400 x10^3/uL    MPV 10.9 8.7 - 12.5 fL    NEUTROPHIL % 79.0 %    LYMPHOCYTE % 13.0 %    MONOCYTE % 7.0 %    EOSINOPHIL % 0.0 %    BASOPHIL % 0.0 %    NEUTROPHIL # 10.61 (H) 1.50 - 7.70 x10^3/uL    LYMPHOCYTE # 1.79 1.00 - 4.80 x10^3/uL    MONOCYTE # 0.87 0.20 - 1.10 x10^3/uL    EOSINOPHIL # <0.10 <=0.50 x10^3/uL    BASOPHIL # <0.10 <=0.20 x10^3/uL    IMMATURE GRANULOCYTE % 1.0 0.0 - 1.0 %    IMMATURE GRANULOCYTE # <0.10 <0.10 x10^3/uL   POC BLOOD GLUCOSE (RESULTS)   Result Value Ref Range    GLUCOSE, POC 157 (H) 70 - 100 mg/dl   POC BLOOD GLUCOSE (RESULTS)   Result Value Ref Range    GLUCOSE, POC 146 (H) 70 - 100 mg/dl   BASIC METABOLIC PANEL   Result Value Ref Range    SODIUM 140 136 - 145 mmol/L    POTASSIUM 3.1 (L) 3.5 - 5.1 mmol/L    CHLORIDE 107 96 - 111 mmol/L    CO2 TOTAL 23 23 - 31 mmol/L    ANION GAP 10 4 - 13 mmol/L    CALCIUM 8.6 8.6 - 10.3 mg/dL    GLUCOSE 865 (H) 65 - 125 mg/dL    BUN  14 8 - 25 mg/dL    CREATININE 7.84 6.96 - 1.35 mg/dL    BUN/CREA RATIO 13 6 - 22    ESTIMATED GFR - MALE 79 >=60 mL/min/BSA   POC BLOOD GLUCOSE (RESULTS)   Result Value Ref Range    GLUCOSE, POC 168 (H) 70 - 100 mg/dl     No results found for any visits on 01/08/23 (from the past 24 hour(s)).     Radiology:    Results for orders placed or performed during the hospital encounter of 01/08/23   XR AP MOBILE CHEST     Status: None    Narrative    INDICATION:  Shortness of breath, chest pain, cough, fever.      TECHNIQUE:  Frontal chest was obtained at 23:35 hours.    COMPARISON:  XR chest 08/18/2020, 03/14/2018.  CT chest/abdomen/pelvis 05/30/2022.     FINDINGS:    The cardiomediastinal silhouette is normal in size.  There is no consolidation or atelectasis in either lung.  There are no pleural effusions.  There is no pneumothorax.  No acute osseous process.  Cervical fusion hardware is noted.      Impression    No acute cardiopulmonary disease.        Signed by Arva Chafe   CT ABDOMEN PELVIS W IV CONTRAST     Status: None    Narrative    INDICATION:  Left lower quadrant abdominal pain with fever, nausea, vomiting and diarrhea.    TECHNIQUE:  CT of the abdomen and pelvis was performed with intravenous contrast.  Isovue 370 100 mL was administered intravenously.      Automated mA/kV exposure control was utilized and patient examination was performed in strict accordance  with principles of ALARA.    RADIATION AMOUNT:  658.40 mGy-cm.    COMPARISON:  [place] right upper quadrant 12/09/2022, CT chest, abdomen and pelvis 05/30/2022, [place] right upper quadrant 04/04/2021, US abdomen 10/03/2018.    FINDINGS:   Abdomen:  The lung bases are clear.  Cardiac size is normal.    Gallbladder is unremarkable.  The liver is slightly low in attenuation, suggesting mild chronic steatosis.  The pancreas, spleen, adrenal glands, and kidneys demonstrate no acute pathology.  Moderate fatty atrophy of the pancreas is noted.    There is no free air or  lymph node enlargement.  Abdominal aorta is not aneurysmal.  Mild calcified atheromatous plaque is seen in the abdominal aorta and iliac arteries.    Pelvis:  There is no bowel wall thickening or obstruction.  Appendix appears normal.  Terminal ileum is unremarkable.  Diverticulosis is present in the descending colon and sigmoid colon.  There is subtle wall thickening of a short segment of the distal colon at the junction of the descending colon and sigmoid colon.  Minimal surrounding fat stranding is present.  There is no free fluid.  Lymph nodes are not enlarged.  Urinary bladder is unremarkable.    Skeleton:    There are no acute fractures.  No suspicious bony lesions.      Impression    Subtle inflammation and wall thickening of the distal colon at the junction of the descending colon and sigmoid colon which may indicate a mild diverticulitis or a low-grade early infectious or inflammatory colitis.  No evidence of bowel obstruction or perforation.  Moderate pancreatic atrophy.  Hepatic steatosis.      Signed by Arva Chafe        Inpatient Medications:  acetaminophen (TYLENOL) tablet, 650 mg, Oral, Q4H PRN  albuterol (PROVENTIL) 2.5 mg / 3 mL (0.083%) neb solution, 2.5 mg, Nebulization, Q4H PRN  budesonide-formoterol (SYMBICORT) 160 mcg-4.5 mcg per inhalation oral inhaler - "Nursing to administer", 2 Puff, Inhalation, 2x/day  cefTRIAXone (ROCEPHIN) 2 g in NS 100 mL IVPB minibag, 2 g, Intravenous, Q24H  Correction/SSIP insulin lispro (HumaLOG) 100 units/mL injection pen, 0-18 Units, Subcutaneous, 4x/day AC  D10W bolus infusion 250 mL, 250 mL, Intravenous, Q1H PRN  dextrose (GLUTOSE) 40% oral gel, 15 g, Oral, Q15 Min PRN  dextrose 50% (0.5 g/mL) injection - syringe, 12.5 g, Intravenous, Q15 Min PRN  enoxaparin PF (LOVENOX) 40 mg/0.4 mL SubQ injection, 40 mg, Subcutaneous, Q24H  gabapentin (NEURONTIN) capsule, 600 mg, Oral, 3x/day  melatonin tablet, 5 mg, Oral, HS PRN  metroNIDAZOLE (FLAGYL) tablet, 500 mg, Oral,  2x/day  nirmatrelvir-ritonavir (PAXLOVID) 300 mg (150 mg x 2)-100mg  tablet therapy pack, 3 Tablet, Oral, 2x/day  NS flush syringe, 3 mL, Intracatheter, Q8HRS  NS flush syringe, 3 mL, Intracatheter, Q1H PRN  ondansetron (ZOFRAN) 2 mg/mL injection, 4 mg, Intravenous, Q6H PRN  pantoprazole (PROTONIX) delayed release tablet, 40 mg, Oral, Daily before Breakfast  potassium bicarbonate-citric acid (EFFER-K) effervescent tablet, 40 mEq, Oral, 2x/day-Food  predniSONE (DELTASONE) tablet, 40 mg, Oral, Daily with Breakfast      Assessment/ Plan:   This is a 62 year old male patient who presented to the ER on 08/24 for chief complaint N/V/D and shortness of breath. Significant PMH includes Asthma, T2DM, HTN, HLD, GERD, MASLD, Obesity. Patient found to have COVID-19 Infection with concern for underlying asthma exacerbation and infectious colitis with severe dehydration. Treatment ongoing as below, symptoms improving. Anticipate to be medically ready for discharge tomorrow.  COVID-19 Infection  Asthma Exacerbation?  -stable on RA  -VBG consistent with respiratory alkalosis, not compensated  -COVID-19 positive on 8/24. Continue enhanced droplet precautions for 10 days  -Blood cultures NGTD  -CT Chest w/ IV Contrast showed normal lungs  -PFT's on 8/19 were normal with no bronchodilator response   -stop Prednisone  -continue Paxlovid  -continue home inhalers Symbicort  -aggressive pulmonary toilet, nebs PRN  -follow-up outpatient with [facility]     Colitis  IBS-C  GERD  -multiple days of N/V/D  -most likely viral 2/2 COVID, patient also just recently started Ozempic   -CT AP showed thickening distal colon junction descending and sigmoid colon mild diverticulitis vs colitis, moderate pancreatic atrophy, hepatic steatosis  -C. Diff and GI Biofire ordered, not obtained due to cessation of diarrhea  -s/p IV fluid resuscitation  -stop antibiotics Ceftriaxone and Flagyl on 8/26 due to resolution in diarrhea and abdominal  pain  -continue PPI  -hold home Lubiprostone  -nausea medication PRN  -Discussed patient with GI briefly, will see in outpatient follow-up in a few weeks     AKI  Lactic Acidosis 2/2 Dehydration  -prerenal  -Cr elevated 1.51 on admission now resolved to baseline  -Cr baseline appears to be 1.1  -UA positive 30 protein  -continue IVF hydration as above  -monitor I&O. Avoid nephrotoxins. Renal adjust medications     Severe Hypokalemia  -2/2 GI symptoms  -continue aggressive electrolyte replacement     Chronic Pain  Mood Disorder  -resume home Percocet PRN (PDMP reviewed)   -continue Gabapentin  -hold home Voltaren gel and Ibuprofen     T2DM  -hold Metformin and Ozempic     HTN  -patient no longer taking any anithypertensives     HLD  -patient no longer taking Lipitor or Lovaza    Patient also asking for referral for Cardiology upon discharge.     DVT Prophylaxis: Lovenox  Full Code  Disposition: Home    Corinna Lines, MD

## 2023-01-10 NOTE — Care Plan (Signed)
Problem: Adult Inpatient Plan of Care  Goal: Patient-Specific Goal (Individualized)  Outcome: Ongoing (see interventions/notes)  Flowsheets (Taken 01/10/2023 1610)  Individualized Care Needs: Support and care provided.  Anxieties, Fears or Concerns: Patient is restless being isolated, wanting to go home.  Patient-Specific Goals (Include Timeframe): Get home when appropriate  Plan of Care Reviewed With: patient     Problem: Adult Inpatient Plan of Care  Goal: Absence of Hospital-Acquired Illness or Injury  Intervention: Prevent and Manage VTE (Venous Thromboembolism) Risk  Recent Flowsheet Documentation  Taken 01/10/2023 0921 by Alinda Sierras, RN  VTE Prevention/Management: ambulation promoted

## 2023-01-10 NOTE — Care Plan (Signed)
Initial assessment completed via telephone due to the patient's isolation. Patient resides at home with his Uncle. There are 6 front steps to enter and 11 basement steps. Patient denies VNA/DME. Patient wishes to return home at discharge and friends will provide transportation. Patient denies MPOA/LW. He identifies his ex wife, Eber Jones as [id number]. Patient confirmed demographic sheet. Patient denies any issues affording medications or barriers at home.     IMM and FOC completed during assessment.     Sharol Given, MSW

## 2023-01-10 NOTE — Telephone Encounter (Signed)
Copied from CRM #[id number]. Topic: Clinical - Question  >> Jan 10, 2023 12:21 PM Kirby Crigler wrote:  Steve Young called wanting to let the office know he has been in the hospital for a few days with covid, and states he believes they will release him tomorrow. He just wanted to let the office know

## 2023-01-10 NOTE — Care Management Notes (Signed)
[facility]  Care Management Initial Evaluation    Patient Name: Steve Young  Date of Birth: Jan 24, 1961  Sex: male  Date/Time of Admission: 01/08/2023 10:43 PM  Room/Bed: 2111/A  Payor: [facility] [employer] MEDICARE / Plan: [employer] MEDICARE / Product Type: MEDICARE [facility] /   Primary Care Providers:  Peter Minium, MD, MD (General)    Pharmacy Info:   Preferred Pharmacy       [facility] [phone] [phone] [place], [place] - [address]    [address] [place] [address]    Phone: [phone] Fax: [phone]    Hours: Not open 24 hours          Emergency Contact Info:   Extended Emergency Contact Information  Primary Emergency Contact: SteveCarolyn  Mobile Phone: [phone]  Relation: Friend  Preferred language: English  Interpreter needed? No    History:   Steve Young is a 62 y.o., male, admitted with Sepsis    Height/Weight: 175.3 cm (5\' 9" ) / 99.8 kg (220 lb)     LOS: 1 day   Admitting Diagnosis: Sepsis (CMS HCC) [A41.9]    Assessment:      01/10/23 1643   Assessment Details   Assessment Type Admission   Date of Next DCP Update 01/12/23   Insurance Information/Type   Insurance type Medicare   Medicare Intent to Discharge Documentation   Admit IMM given to: discussed via phone   Admit IMM letter given date 01/10/23   Admit IMM letter time given 1605   IMM explained/reviewed with:  Patient;discussed via phone;verbalized understanding   Employment/Financial   Patient has Prescription Coverage?  Yes        Name of Insurance Coverage for Medications Chief of Staff [facility]   Financial Concerns none   Living Environment   Select an age group to open "lives with" row.  Adult   Lives With other relative(s) (specify);other (see comments)  (Uncle)   Living Arrangements house   Able to Return to Prior Arrangements yes   Living Arrangement Comments 6 STE the front/ 11 basement steps   Home Safety   Home Assessment: No Problems Identified   Home  Accessibility no concerns   Care Management Plan   Discharge Planning Status initial meeting   Discharge plan discussed with: Patient   CM will evaluate for rehabilitation potential yes   Patient choice offered to patient/family no   Form for patient choice reviewed/signed and on chart yes   Discharge Needs Assessment   Equipment Currently Used at Home none   Equipment Needed After Discharge none   Discharge Facility/Level of Care Needs Home (Patient/Family Member/other)(code 1)   Transportation Available car;family or friend will provide   Referral Information   Admission Type inpatient   Address Verified verified-no changes   Arrived From home or self-care   ADVANCE DIRECTIVES   Does the Patient have an Advance Directive? No, Information Offered and Refused   Patient Hand-Off   Clinical/Discharge Plan of Care Information Communicated to:  Medical Social Worker     Initial assessment completed via telephone due to the patient's isolation. Patient resides at home with his Uncle. There are 6 front steps to enter and 11 basement steps. Patient denies VNA/DME. Patient wishes to return home at discharge and friends will provide transportation. Patient denies MPOA/LW. He identifies his ex wife, Steve Young as [id number]. Patient confirmed demographic sheet. Patient denies any issues affording medications or barriers at home.     IMM and FOC  completed during assessment.     Discharge Plan:  Home (Patient/Family Member/other) (code 1)      The patient will continue to be evaluated for developing discharge needs.     Case Manager: Sharol Given, MSW  Phone: [phone]

## 2023-01-10 NOTE — Pharmacy (Signed)
[facility]  Medication History Changes  01/10/2023    Patient: Steve, Young    MRN: [id number]  Date of Birth:  03/26/1961    The following medications were    Changed:  .    Added:  .    Removed:  Albuterol, Amlodipine, Aspirin, Atorvastatin, Vitamin D3, Cyclobenzaprine, Diclofenac, Folic Acid, Losartan, Amitiza, Mag-Ox, Narcan, Lovaza, Paxil, Sildenafil, Spiriva, Maxide-25, Accolate    Information was collected from:  Pharmacy

## 2023-01-10 NOTE — Care Plan (Signed)
Problem: Adult Inpatient Plan of Care  Goal: Plan of Care Review  Outcome: Ongoing (see interventions/notes)  Goal: Patient-Specific Goal (Individualized)  Outcome: Ongoing (see interventions/notes)  Goal: Absence of Hospital-Acquired Illness or Injury  Outcome: Ongoing (see interventions/notes)  Goal: Optimal Comfort and Wellbeing  Outcome: Ongoing (see interventions/notes)  Goal: Rounds/Family Conference  Outcome: Ongoing (see interventions/notes)

## 2023-01-11 ENCOUNTER — Encounter (HOSPITAL_COMMUNITY): Payer: Self-pay | Admitting: HOSPITALIST-INTERNAL MEDICINE

## 2023-01-11 DIAGNOSIS — K581 Irritable bowel syndrome with constipation: Secondary | ICD-10-CM

## 2023-01-11 DIAGNOSIS — G8929 Other chronic pain: Secondary | ICD-10-CM

## 2023-01-11 DIAGNOSIS — F39 Unspecified mood [affective] disorder: Secondary | ICD-10-CM

## 2023-01-11 DIAGNOSIS — E119 Type 2 diabetes mellitus without complications: Secondary | ICD-10-CM

## 2023-01-11 DIAGNOSIS — J45909 Unspecified asthma, uncomplicated: Secondary | ICD-10-CM

## 2023-01-11 DIAGNOSIS — I10 Essential (primary) hypertension: Secondary | ICD-10-CM

## 2023-01-11 DIAGNOSIS — E860 Dehydration: Secondary | ICD-10-CM

## 2023-01-11 DIAGNOSIS — E1169 Type 2 diabetes mellitus with other specified complication: Secondary | ICD-10-CM

## 2023-01-11 DIAGNOSIS — E873 Alkalosis: Secondary | ICD-10-CM

## 2023-01-11 DIAGNOSIS — E669 Obesity, unspecified: Secondary | ICD-10-CM

## 2023-01-11 DIAGNOSIS — K760 Fatty (change of) liver, not elsewhere classified: Secondary | ICD-10-CM

## 2023-01-11 DIAGNOSIS — K219 Gastro-esophageal reflux disease without esophagitis: Secondary | ICD-10-CM

## 2023-01-11 DIAGNOSIS — E785 Hyperlipidemia, unspecified: Secondary | ICD-10-CM

## 2023-01-11 HISTORY — DX: Type 2 diabetes mellitus with other specified complication: E11.69

## 2023-01-11 LAB — CBC WITH DIFF
BASOPHIL #: <0.1 10*3/uL (ref <=0.20)
BASOPHIL %: 0 %
EOSINOPHIL #: <0.1 10*3/uL (ref <=0.50)
EOSINOPHIL %: 0 %
HCT: 32.5 % — ABNORMAL LOW (ref 38.9–52.0)
HGB: 11.5 g/dL — ABNORMAL LOW (ref 13.4–17.5)
IMMATURE GRANULOCYTE #: <0.1 10*3/uL (ref <0.10)
IMMATURE GRANULOCYTE %: 1 % (ref 0.0–1.0)
LYMPHOCYTE #: 1.41 10*3/uL (ref 1.00–4.80)
LYMPHOCYTE %: 10 %
MCH: 29.5 pg (ref 26.0–32.0)
MCHC: 35.4 g/dL (ref 31.0–35.5)
MCV: 83.3 fL (ref 78.0–100.0)
MONOCYTE #: 0.59 10*3/uL (ref 0.20–1.10)
MONOCYTE %: 4 %
MPV: 11.1 fL (ref 8.7–12.5)
NEUTROPHIL #: 11.93 10*3/uL — ABNORMAL HIGH (ref 1.50–7.70)
NEUTROPHIL %: 85 %
PLATELETS: 217 10*3/uL (ref 150–400)
RBC: 3.9 10*6/uL — ABNORMAL LOW (ref 4.50–6.10)
RDW-CV: 14.7 % (ref 11.5–15.5)
WBC: 14 10*3/uL — ABNORMAL HIGH (ref 3.7–11.0)

## 2023-01-11 LAB — POC BLOOD GLUCOSE (RESULTS)
GLUCOSE, POC: 156 mg/dl — ABNORMAL HIGH (ref 70–100)
GLUCOSE, POC: 121 mg/dl — ABNORMAL HIGH (ref 70–100)
GLUCOSE, POC: 134 mg/dl — ABNORMAL HIGH (ref 70–100)
GLUCOSE, POC: 130 mg/dl — ABNORMAL HIGH (ref 70–100)

## 2023-01-11 LAB — BASIC METABOLIC PANEL
ANION GAP: 9 mmol/L (ref 4–13)
BUN/CREA RATIO: 16 (ref 6–22)
BUN: 16 mg/dL (ref 8–25)
CALCIUM: 8.6 mg/dL (ref 8.6–10.3)
CHLORIDE: 104 mmol/L (ref 96–111)
CO2 TOTAL: 26 mmol/L (ref 23–31)
CREATININE: 1.02 mg/dL (ref 0.75–1.35)
ESTIMATED GFR - MALE: 83 mL/min/BSA (ref >=60)
GLUCOSE: 136 mg/dL — ABNORMAL HIGH (ref 65–125)
POTASSIUM: 2.9 mmol/L — ABNORMAL LOW (ref 3.5–5.1)
SODIUM: 139 mmol/L (ref 136–145)

## 2023-01-11 LAB — PHOSPHORUS: PHOSPHORUS: 3.4 mg/dL (ref 2.3–4.0)

## 2023-01-11 LAB — MAGNESIUM: MAGNESIUM: 2.2 mg/dL (ref 1.8–2.6)

## 2023-01-11 LAB — POTASSIUM: POTASSIUM: 2.9 mmol/L — ABNORMAL LOW (ref 3.5–5.1)

## 2023-01-11 MED ORDER — POTASSIUM CHLORIDE 10 MEQ/100ML IN STERILE WATER INTRAVENOUS PIGGYBACK
10.0000 meq | INJECTION | INTRAVENOUS | Status: AC
Start: 2023-01-11 — End: 2023-01-11
  Administered 2023-01-11: 10 meq via INTRAVENOUS
  Administered 2023-01-11 (×2): 0 meq via INTRAVENOUS
  Administered 2023-01-11 (×2): 10 meq via INTRAVENOUS
  Administered 2023-01-11: 0 meq via INTRAVENOUS
  Filled 2023-01-11 (×3): qty 100

## 2023-01-11 MED ORDER — POTASSIUM CHLORIDE 10 MEQ/100ML IN STERILE WATER INTRAVENOUS PIGGYBACK
10.0000 meq | INJECTION | INTRAVENOUS | Status: AC
Start: 2023-01-11 — End: 2023-01-11
  Administered 2023-01-11: 0 meq via INTRAVENOUS
  Administered 2023-01-11 (×2): 10 meq via INTRAVENOUS
  Administered 2023-01-11 (×3): 0 meq via INTRAVENOUS
  Administered 2023-01-11 (×2): 10 meq via INTRAVENOUS
  Administered 2023-01-11: 0 meq via INTRAVENOUS
  Administered 2023-01-11: 10 meq via INTRAVENOUS
  Filled 2023-01-11 (×5): qty 100

## 2023-01-11 NOTE — Discharge Instructions (Signed)
Patient discharged home with family.  AVS reviewed with patient/care giver.  A written copy of the AVS and discharge instructions was given to the patient/care giver. Scripts handed to patient/care giver. Questions sufficiently answered as needed.  Patient/care giver encouraged to follow up with PCP as indicated.  In the event of an emergency, patient/care giver instructed to call 911 or go to the nearest emergency room.

## 2023-01-11 NOTE — Progress Notes (Signed)
[facility]  Internal Medicine Progress Note  Full Code    Name: Steve Young    Date of service: 01/11/2023    Chief Complaint: Generalized Body Aches and Vomiting     Subjective   Patient seen and examined this am, he reports still just not feeling well overall. He is eating and drinking somewhat but not back to baseline. He denies vomiting or diarrhea.     No Known Allergies    Objective     Vital Signs:  Blood pressure 132/83, pulse 67, temperature 36.9 C (98.4 F), resp. rate 20, height 1.753 m (5\' 9" ), weight 99.8 kg (220 lb), SpO2 96%.     Physical Exam:  Nursing note and vitals reviewed.  General: 62 y.o. male, appears stated age, in no acute distress, laying comfortably in bed  Eyes: Pupils equal and round  HENT: MMM  Neck: Supple  CV: Regular rate and rhythm without murmurs  Lungs: Clear to auscultation bilaterally. No wheezes, crackles or rhonchi. Comfortable work of breathing on RA.  Abdomen: Soft, non-tender, non-distended, bowel sounds present  Neuro: Alert and answers questions appropriately.  Musculoskeletal: Grossly normal limb movements without obvious deformity  Extremities: No peripheral edema  Skin:Warm and dry. No visible rashes or lesions.   Psych: Behavior, speech and affect are appropriate.     Lines, Drains, and Airways:  Patient Lines/Drains/Airways Status       Active Line / Dialysis Catheter / Dialysis Graft / Drain / Airway / Wound       Name Placement date Placement time Site Days    Peripheral IV Posterior;Right Dorsal Metacarpals  (top of hand) 01/11/23  0556  -- less than 1                  Labs:  Results for orders placed or performed during the hospital encounter of 01/08/23 (from the past 24 hour(s))   BASIC METABOLIC PANEL   Result Value Ref Range    SODIUM 140 136 - 145 mmol/L    POTASSIUM 3.1 (L) 3.5 - 5.1 mmol/L    CHLORIDE 107 96 - 111 mmol/L    CO2 TOTAL 23 23 - 31 mmol/L    ANION GAP 10 4 - 13 mmol/L    CALCIUM 8.6 8.6 - 10.3 mg/dL    GLUCOSE 657 (H) 65 -  125 mg/dL    BUN 14 8 - 25 mg/dL    CREATININE 8.46 9.62 - 1.35 mg/dL    BUN/CREA RATIO 13 6 - 22    ESTIMATED GFR - MALE 79 >=60 mL/min/BSA   POC BLOOD GLUCOSE (RESULTS)   Result Value Ref Range    GLUCOSE, POC 168 (H) 70 - 100 mg/dl   POC BLOOD GLUCOSE (RESULTS)   Result Value Ref Range    GLUCOSE, POC 165 (H) 70 - 100 mg/dl   PHOSPHORUS   Result Value Ref Range    PHOSPHORUS 3.4 2.3 - 4.0 mg/dL   MAGNESIUM   Result Value Ref Range    MAGNESIUM 2.2 1.8 - 2.6 mg/dL   BASIC METABOLIC PANEL   Result Value Ref Range    SODIUM 139 136 - 145 mmol/L    POTASSIUM 2.9 (L) 3.5 - 5.1 mmol/L    CHLORIDE 104 96 - 111 mmol/L    CO2 TOTAL 26 23 - 31 mmol/L    ANION GAP 9 4 - 13 mmol/L    CALCIUM 8.6 8.6 - 10.3 mg/dL    GLUCOSE 952 (H) 65 -  125 mg/dL    BUN 16 8 - 25 mg/dL    CREATININE 8.46 9.62 - 1.35 mg/dL    BUN/CREA RATIO 16 6 - 22    ESTIMATED GFR - MALE 83 >=60 mL/min/BSA   CBC WITH DIFF   Result Value Ref Range    WBC 14.0 (H) 3.7 - 11.0 x10^3/uL    RBC 3.90 (L) 4.50 - 6.10 x10^6/uL    HGB 11.5 (L) 13.4 - 17.5 g/dL    HCT 95.2 (L) 84.1 - 52.0 %    MCV 83.3 78.0 - 100.0 fL    MCH 29.5 26.0 - 32.0 pg    MCHC 35.4 31.0 - 35.5 g/dL    RDW-CV 32.4 40.1 - 02.7 %    PLATELETS 217 150 - 400 x10^3/uL    MPV 11.1 8.7 - 12.5 fL    NEUTROPHIL % 85.0 %    LYMPHOCYTE % 10.0 %    MONOCYTE % 4.0 %    EOSINOPHIL % 0.0 %    BASOPHIL % 0.0 %    NEUTROPHIL # 11.93 (H) 1.50 - 7.70 x10^3/uL    LYMPHOCYTE # 1.41 1.00 - 4.80 x10^3/uL    MONOCYTE # 0.59 0.20 - 1.10 x10^3/uL    EOSINOPHIL # <0.10 <=0.50 x10^3/uL    BASOPHIL # <0.10 <=0.20 x10^3/uL    IMMATURE GRANULOCYTE % 1.0 0.0 - 1.0 %    IMMATURE GRANULOCYTE # <0.10 <0.10 x10^3/uL   POC BLOOD GLUCOSE (RESULTS)   Result Value Ref Range    GLUCOSE, POC 156 (H) 70 - 100 mg/dl   POC BLOOD GLUCOSE (RESULTS)   Result Value Ref Range    GLUCOSE, POC 121 (H) 70 - 100 mg/dl   POTASSIUM   Result Value Ref Range    POTASSIUM 2.9 (L) 3.5 - 5.1 mmol/L     No results found for any visits on 01/08/23 (from  the past 24 hour(s)).     Radiology:    Results for orders placed or performed during the hospital encounter of 01/08/23   XR AP MOBILE CHEST     Status: None    Narrative    INDICATION:  Shortness of breath, chest pain, cough, fever.      TECHNIQUE:  Frontal chest was obtained at 23:35 hours.    COMPARISON:  XR chest 08/18/2020, 03/14/2018.  CT chest/abdomen/pelvis 05/30/2022.     FINDINGS:    The cardiomediastinal silhouette is normal in size.  There is no consolidation or atelectasis in either lung.  There are no pleural effusions.  There is no pneumothorax.  No acute osseous process.  Cervical fusion hardware is noted.      Impression    No acute cardiopulmonary disease.        Signed by Arva Chafe   CT ABDOMEN PELVIS W IV CONTRAST     Status: None    Narrative    INDICATION:  Left lower quadrant abdominal pain with fever, nausea, vomiting and diarrhea.    TECHNIQUE:  CT of the abdomen and pelvis was performed with intravenous contrast.  Isovue 370 100 mL was administered intravenously.      Automated mA/kV exposure control was utilized and patient examination was performed in strict accordance with principles of ALARA.    RADIATION AMOUNT:  658.40 mGy-cm.    COMPARISON:  [place] right upper quadrant 12/09/2022, CT chest, abdomen and pelvis 05/30/2022, [place] right upper quadrant 04/04/2021, US abdomen 10/03/2018.    FINDINGS:   Abdomen:  The lung bases  are clear.  Cardiac size is normal.    Gallbladder is unremarkable.  The liver is slightly low in attenuation, suggesting mild chronic steatosis.  The pancreas, spleen, adrenal glands, and kidneys demonstrate no acute pathology.  Moderate fatty atrophy of the pancreas is noted.    There is no free air or lymph node enlargement.  Abdominal aorta is not aneurysmal.  Mild calcified atheromatous plaque is seen in the abdominal aorta and iliac arteries.    Pelvis:  There is no bowel wall thickening or obstruction.  Appendix appears normal.  Terminal ileum is unremarkable.   Diverticulosis is present in the descending colon and sigmoid colon.  There is subtle wall thickening of a short segment of the distal colon at the junction of the descending colon and sigmoid colon.  Minimal surrounding fat stranding is present.  There is no free fluid.  Lymph nodes are not enlarged.  Urinary bladder is unremarkable.    Skeleton:    There are no acute fractures.  No suspicious bony lesions.      Impression    Subtle inflammation and wall thickening of the distal colon at the junction of the descending colon and sigmoid colon which may indicate a mild diverticulitis or a low-grade early infectious or inflammatory colitis.  No evidence of bowel obstruction or perforation.  Moderate pancreatic atrophy.  Hepatic steatosis.      Signed by Arva Chafe        Inpatient Medications:  acetaminophen (TYLENOL) tablet, 650 mg, Oral, Q4H PRN  albuterol (PROVENTIL) 2.5 mg / 3 mL (0.083%) neb solution, 2.5 mg, Nebulization, Q4H PRN  budesonide-formoterol (SYMBICORT) 160 mcg-4.5 mcg per inhalation oral inhaler - "Nursing to administer", 2 Puff, Inhalation, 2x/day  Correction/SSIP insulin lispro (HumaLOG) 100 units/mL injection pen, 0-18 Units, Subcutaneous, 4x/day AC  D10W bolus infusion 250 mL, 250 mL, Intravenous, Q1H PRN  dextrose (GLUTOSE) 40% oral gel, 15 g, Oral, Q15 Min PRN  dextrose 50% (0.5 g/mL) injection - syringe, 12.5 g, Intravenous, Q15 Min PRN  enoxaparin PF (LOVENOX) 40 mg/0.4 mL SubQ injection, 40 mg, Subcutaneous, Q24H  gabapentin (NEURONTIN) capsule, 600 mg, Oral, 3x/day  melatonin tablet, 5 mg, Oral, HS PRN  nirmatrelvir-ritonavir (PAXLOVID) 300 mg (150 mg x 2)-100mg  tablet therapy pack, 3 Tablet, Oral, 2x/day  NS flush syringe, 3 mL, Intracatheter, Q8HRS  NS flush syringe, 3 mL, Intracatheter, Q1H PRN  ondansetron (ZOFRAN) 2 mg/mL injection, 4 mg, Intravenous, Q6H PRN  pantoprazole (PROTONIX) delayed release tablet, 40 mg, Oral, Daily before Breakfast  potassium bicarbonate-citric acid  (EFFER-K) effervescent tablet, 40 mEq, Oral, 2x/day-Food  potassium chloride 10 mEq in SW 100 mL premix infusion, 10 mEq, Intravenous, Q1H      Assessment/ Plan:   This is a 62 year old male patient who presented to the ER on 08/24 for chief complaint N/V/D and shortness of breath. Significant PMH includes Asthma, T2DM, HTN, HLD, GERD, MASLD, Obesity. Patient found to have COVID-19 Infection with concern for underlying asthma exacerbation and infectious colitis with severe dehydration. Treatment ongoing as below, symptoms improving. Anticipate to be medically ready for discharge tomorrow.     COVID-19 Infection  Asthma Exacerbation?  -stable on RA  -VBG consistent with respiratory alkalosis, not compensated  -COVID-19 positive on 8/24. Continue enhanced droplet precautions for 10 days  -Blood cultures NGTD  -CT Chest w/ IV Contrast showed normal lungs  -PFT's on 8/19 were normal with no bronchodilator response   -stop Prednisone  -continue Paxlovid  -continue home inhalers Symbicort  -  aggressive pulmonary toilet, nebs PRN  -follow-up outpatient with [facility]     Colitis  IBS-C  GERD  -multiple days of N/V/D  -most likely viral 2/2 COVID, patient also just recently started Ozempic   -CT AP showed thickening distal colon junction descending and sigmoid colon mild diverticulitis vs colitis, moderate pancreatic atrophy, hepatic steatosis  -C. Diff and GI Biofire ordered, not obtained due to cessation of diarrhea  -s/p IV fluid resuscitation  -stop antibiotics Ceftriaxone and Flagyl on 8/26 due to resolution in diarrhea and abdominal pain  -continue PPI  -hold home Lubiprostone  -nausea medication PRN  -Discussed patient with GI briefly, will see in outpatient follow-up in a few weeks     AKI  Lactic Acidosis 2/2 Dehydration  -prerenal  -Cr elevated 1.51 on admission now resolved to baseline  -Cr baseline appears to be 1.1  -UA positive 30 protein  -continue IVF hydration as above  -monitor I&O. Avoid nephrotoxins.  Renal adjust medications     Severe Hypokalemia  -2/2 GI symptoms  -continue aggressive electrolyte replacement     Chronic Pain  Mood Disorder  -resume home Percocet PRN (PDMP reviewed)   -continue Gabapentin  -hold home Voltaren gel and Ibuprofen     T2DM  -hold Metformin and Ozempic     HTN  -patient no longer taking any anithypertensives     HLD  -patient no longer taking Lipitor or Lovaza    Patient also asking for referral for Cardiology upon discharge.     DVT Prophylaxis: Lovenox  Full Code  Disposition: still w/ hypokalemia    Ranae Pila, MD

## 2023-01-11 NOTE — Care Management Notes (Signed)
Discharge Recommendations/ Plan:Discharge HB:[id number] (Patient/Family Member/other) (code 1)      R   01/11/23 1131   Assessment Details   Date of Care Management Update 01/11/23   Medicare Intent to Discharge Documentation   Discharge IMM give to: Not Applicable   Reason IMM "not applicable": Patient's Discharge date and time less than 48hrs from documented date and time of delviery for the "Admission IMM"   Care Management Plan   Discharge Planning Status discharge plan complete   Projected Discharge Date 01/11/23   Discharge plan discussed with: Patient   Discharge Needs Assessment   Discharge Facility/Level of Care Needs Home (Patient/Family Member/other)(code 1)   Transportation Available family or friend will provide   Transportation Arranged None   Referral Information   Admission Type inpatient     Patient to discharge to home. Family friend to provide transportation     Yisroel Ramming, Charity fundraiser

## 2023-01-11 NOTE — Care Plan (Signed)
Problem: Adult Inpatient Plan of Care  Goal: Plan of Care Review  Outcome: Ongoing (see interventions/notes)  Goal: Patient-Specific Goal (Individualized)  Outcome: Ongoing (see interventions/notes)  Goal: Absence of Hospital-Acquired Illness or Injury  Outcome: Ongoing (see interventions/notes)  Goal: Optimal Comfort and Wellbeing  Outcome: Ongoing (see interventions/notes)  Goal: Rounds/Family Conference  Outcome: Ongoing (see interventions/notes)     Problem: Intestinal Obstruction  Goal: Optimal Bowel Function  Outcome: Ongoing (see interventions/notes)  Goal: Fluid and Electrolyte Balance  Outcome: Ongoing (see interventions/notes)  Goal: Absence of Infection Signs and Symptoms  Outcome: Ongoing (see interventions/notes)  Goal: Optimize Nutrition Status  Outcome: Ongoing (see interventions/notes)  Goal: Optimal Pain Control and Function  Outcome: Ongoing (see interventions/notes)

## 2023-01-11 NOTE — Discharge Summary (Incomplete)
[facility]  DISCHARGE SUMMARY      PATIENT NAME:  Zabian, Trucks  MRN:  [id number]  DOB:  [birth year]      INPATIENT ADMISSION DATE: 01/08/2023  DISCHARGE DATE: 01/12/23     ADMITTING PHYSICIAN:  Dr. Leticia Clas  DISCHARGING PHYSICIAN : Dr. Tresa Endo  PRIMARY CARE PHYSICIAN: Peter Minium, MD       DISCHARGE DIAGNOSIS:   Fever  COVID-19  Colitis  IBS-C  GERD  Hypokalemia  AKI  Lactic acidosis   Vomiting, diarrhea   HTN  HLD  Neuropathy     HPI:  (Copied from Admission HPI):  This is a 62 year old male with past medical history of hypertension, HLD, and neuropathy who presented 01/09/23 due to fever, vomiting, and diarrhea. Patient reports over the past several days he has had increasing shortness a breath with fever and nausea, vomiting, and diarrhea. Reports breathing is worse with exertion. History of asthma on inhalers at home. He reports some slight lower abdominal discomfort. Denies any blood in emesis or stool. Denies any recent sick contacts. Reports subjective fevers at home. Denies chest pain, or dysuria. Has no other acute complaints at this time. Of note recently started on Ozempic.     HOSPITAL COURSE:  Work up showed K 2.3, Cr 1.5, Lactate 2.6. CXR w/o acute process. CT a/p showed wall thickening of distal colon which could be mild diverticulitis or low grade early infectious/inflammatory colitis. He was started on breathing treatments, steroids, ceftriaxone and flagyl. VBG showed respiratory alkalosis. He was found to be COVID+ on 01/08/23. Blood cultures no growht. CT chest showed no acute process. He was started on paxlovid. He remained on RA. His vomiting and diarrhea likely related to covid as well as recently starting ozempic. His diarrhea resolved shortly after admission. Antibiotics were stopped due to resolution of GI symptoms. AKI improved w/ IVF. K was repleted. He will be discharged on oral K with repeat BMP in a couple of days. He should f/u with GI, PCP, and requested cards referral  on DC.       Physical Exam:  Blood pressure 130/81, pulse 61, temperature 36.9 C (98.4 F), resp. rate 20, height 1.753 m (5\' 9" ), weight 99.8 kg (220 lb), SpO2 96%.       General: Alert awake no apparent distress  HEENT: Normocephalic atraumatic pupils equal round reactive  Neck: supple  Cardiovascular: Regular rate and rhythm, no murmurs or gallops  Lungs: No increased work of breathing, bilateral air entry no rales or rhonchi  Abdomen: Soft ,nontender, nondistended ,no rigidity, no rebound tenderness  Musculoskeletal: no edema  Skin: No lesions no rashes  Neurological: Awake, alert    Labs:  Results for orders placed or performed during the hospital encounter of 01/08/23 (from the past 24 hour(s))   POTASSIUM   Result Value Ref Range    POTASSIUM 2.9 (L) 3.5 - 5.1 mmol/L   POC BLOOD GLUCOSE (RESULTS)   Result Value Ref Range    GLUCOSE, POC 134 (H) 70 - 100 mg/dl   POC BLOOD GLUCOSE (RESULTS)   Result Value Ref Range    GLUCOSE, POC 130 (H) 70 - 100 mg/dl   BASIC METABOLIC PANEL   Result Value Ref Range    SODIUM 138 136 - 145 mmol/L    POTASSIUM 3.0 (L) 3.5 - 5.1 mmol/L    CHLORIDE 104 96 - 111 mmol/L    CO2 TOTAL 25 23 - 31 mmol/L    ANION GAP 9 4 -  13 mmol/L    CALCIUM 8.7 8.6 - 10.3 mg/dL    GLUCOSE 086 (H) 65 - 125 mg/dL    BUN 14 8 - 25 mg/dL    CREATININE 5.78 4.69 - 1.35 mg/dL    BUN/CREA RATIO 15 6 - 22    ESTIMATED GFR - MALE >90 >=60 mL/min/BSA   MAGNESIUM   Result Value Ref Range    MAGNESIUM 2.0 1.8 - 2.6 mg/dL   PHOSPHORUS   Result Value Ref Range    PHOSPHORUS 2.6 2.3 - 4.0 mg/dL   CBC WITH DIFF   Result Value Ref Range    WBC 8.7 3.7 - 11.0 x10^3/uL    RBC 3.81 (L) 4.50 - 6.10 x10^6/uL    HGB 11.1 (L) 13.4 - 17.5 g/dL    HCT 62.9 (L) 52.8 - 52.0 %    MCV 84.5 78.0 - 100.0 fL    MCH 29.1 26.0 - 32.0 pg    MCHC 34.5 31.0 - 35.5 g/dL    RDW-CV 41.3 24.4 - 01.0 %    PLATELETS 217 150 - 400 x10^3/uL    MPV 11.2 8.7 - 12.5 fL    NEUTROPHIL % 65.0 %    LYMPHOCYTE % 26.0 %    MONOCYTE % 8.0 %     EOSINOPHIL % 0.0 %    BASOPHIL % 0.0 %    NEUTROPHIL # 5.63 1.50 - 7.70 x10^3/uL    LYMPHOCYTE # 2.28 1.00 - 4.80 x10^3/uL    MONOCYTE # 0.67 0.20 - 1.10 x10^3/uL    EOSINOPHIL # <0.10 <=0.50 x10^3/uL    BASOPHIL # <0.10 <=0.20 x10^3/uL    IMMATURE GRANULOCYTE % 1.0 0.0 - 1.0 %    IMMATURE GRANULOCYTE # <0.10 <0.10 x10^3/uL   POC BLOOD GLUCOSE (RESULTS)   Result Value Ref Range    GLUCOSE, POC 119 (H) 70 - 100 mg/dl   BASIC METABOLIC PANEL   Result Value Ref Range    SODIUM 140 136 - 145 mmol/L    POTASSIUM 3.2 (L) 3.5 - 5.1 mmol/L    CHLORIDE 104 96 - 111 mmol/L    CO2 TOTAL 26 23 - 31 mmol/L    ANION GAP 10 4 - 13 mmol/L    CALCIUM 8.9 8.6 - 10.3 mg/dL    GLUCOSE 272 65 - 536 mg/dL    BUN 13 8 - 25 mg/dL    CREATININE 6.44 0.34 - 1.35 mg/dL    BUN/CREA RATIO 15 6 - 22    ESTIMATED GFR - MALE >90 >=60 mL/min/BSA   POC BLOOD GLUCOSE (RESULTS)   Result Value Ref Range    GLUCOSE, POC 138 (H) 70 - 100 mg/dl     Diagnostic Studies:  Results for orders placed or performed during the hospital encounter of 01/08/23   XR AP MOBILE CHEST     Status: None    Narrative    INDICATION:  Shortness of breath, chest pain, cough, fever.      TECHNIQUE:  Frontal chest was obtained at 23:35 hours.    COMPARISON:  XR chest 08/18/2020, 03/14/2018.  CT chest/abdomen/pelvis 05/30/2022.     FINDINGS:    The cardiomediastinal silhouette is normal in size.  There is no consolidation or atelectasis in either lung.  There are no pleural effusions.  There is no pneumothorax.  No acute osseous process.  Cervical fusion hardware is noted.      Impression    No acute cardiopulmonary disease.  Signed by Arva Chafe   CT ABDOMEN PELVIS W IV CONTRAST     Status: None    Narrative    INDICATION:  Left lower quadrant abdominal pain with fever, nausea, vomiting and diarrhea.    TECHNIQUE:  CT of the abdomen and pelvis was performed with intravenous contrast.  Isovue 370 100 mL was administered intravenously.      Automated mA/kV exposure control  was utilized and patient examination was performed in strict accordance with principles of ALARA.    RADIATION AMOUNT:  658.40 mGy-cm.    COMPARISON:  [place] right upper quadrant 12/09/2022, CT chest, abdomen and pelvis 05/30/2022, [place] right upper quadrant 04/04/2021, US abdomen 10/03/2018.    FINDINGS:   Abdomen:  The lung bases are clear.  Cardiac size is normal.    Gallbladder is unremarkable.  The liver is slightly low in attenuation, suggesting mild chronic steatosis.  The pancreas, spleen, adrenal glands, and kidneys demonstrate no acute pathology.  Moderate fatty atrophy of the pancreas is noted.    There is no free air or lymph node enlargement.  Abdominal aorta is not aneurysmal.  Mild calcified atheromatous plaque is seen in the abdominal aorta and iliac arteries.    Pelvis:  There is no bowel wall thickening or obstruction.  Appendix appears normal.  Terminal ileum is unremarkable.  Diverticulosis is present in the descending colon and sigmoid colon.  There is subtle wall thickening of a short segment of the distal colon at the junction of the descending colon and sigmoid colon.  Minimal surrounding fat stranding is present.  There is no free fluid.  Lymph nodes are not enlarged.  Urinary bladder is unremarkable.    Skeleton:    There are no acute fractures.  No suspicious bony lesions.      Impression    Subtle inflammation and wall thickening of the distal colon at the junction of the descending colon and sigmoid colon which may indicate a mild diverticulitis or a low-grade early infectious or inflammatory colitis.  No evidence of bowel obstruction or perforation.  Moderate pancreatic atrophy.  Hepatic steatosis.      Signed by Arva Chafe          DISCHARGE MEDICATIONS:     Current Discharge Medication List        START taking these medications.        Details   potassium bicarb-citric acid 20 mEq Tablet, Effervescent  Commonly known as: EFFER-K   40 mEq, Oral, 2 TIMES DAILY WITH FOOD  Qty: 12  Tablet  Refills: 0            CONTINUE these medications - NO CHANGES were made during your visit.        Details   gabapentin 600 mg Tablet  Commonly known as: NEURONTIN   600 mg, Oral, 3 TIMES DAILY  Qty: 90 Tablet  Refills: 3     Ibuprofen 800 mg Tablet  Commonly known as: MOTRIN   take 1 tablet by mouth three times a day if needed for pain  Qty: 60 Tablet  Refills: 3     metFORMIN 500 mg Tablet  Commonly known as: GLUCOPHAGE   500 mg, Oral, EVERY MORNING BEFORE BREAKFAST  Qty: 90 Tablet  Refills: 1     mometasone-formoterol 200-5 mcg/dose HFA Aerosol Inhaler  Commonly known as: DULERA   2 Puffs, Inhalation, 2 TIMES DAILY  Qty: 13 g  Refills: 3     omeprazole 40 mg  Capsule, Delayed Release(E.C.)  Commonly known as: PRILOSEC   40 mg, Oral, DAILY  Qty: 90 Capsule  Refills: 1     oxyCODONE-acetaminophen 10-325 mg tablet  Commonly known as: PERCOCET   take 1 tablet by mouth three times a day if needed for pain  Refills: 0     semaglutide 0.25 mg or 0.5 mg (2 mg/3 mL) Pen Injector  Commonly known as: OZEMPIC  Start taking on: December 27, 2022   Inject 0.25 mg under the skin Every 7 days for 28 days, THEN 0.5 mg Every 7 days for 14 days. Indications: weight loss management for an obese person.  Qty: 3 mL  Refills: 0              DISCHARGE INSTRUCTIONS:      BASIC METABOLIC PANEL     Release to patient Automated [100001]      FOLLOW-UP: [facility] - [facility]ARDIOLOGY - [facility] - [place], [place]     Follow-up in: 1 MONTH    Reason for visit: HOSPITAL DISCHARGE    Post Discharge Destination: Home       Follow-up Information       Peter Minium, MD Follow up on 01/14/2023.    Specialty: FAMILY MEDICINE  Why: 9:15 am with Roseanne Kaufman information:  [address]  [place] [address]  [phone]                             Isolation Status:  Enhanced Droplet & Contact II      CONDITION ON DISCHARGE: stable    DISCHARGE DISPOSITION:  home    Total Time Spent: 45 min    Ranae Pila, MD

## 2023-01-12 DIAGNOSIS — E1140 Type 2 diabetes mellitus with diabetic neuropathy, unspecified: Secondary | ICD-10-CM

## 2023-01-12 LAB — CBC WITH DIFF
BASOPHIL #: <0.1 10*3/uL (ref <=0.20)
BASOPHIL %: 0 %
EOSINOPHIL #: <0.1 10*3/uL (ref <=0.50)
EOSINOPHIL %: 0 %
HCT: 32.2 % — ABNORMAL LOW (ref 38.9–52.0)
HGB: 11.1 g/dL — ABNORMAL LOW (ref 13.4–17.5)
IMMATURE GRANULOCYTE #: <0.1 10*3/uL (ref <0.10)
IMMATURE GRANULOCYTE %: 1 % (ref 0.0–1.0)
LYMPHOCYTE #: 2.28 10*3/uL (ref 1.00–4.80)
LYMPHOCYTE %: 26 %
MCH: 29.1 pg (ref 26.0–32.0)
MCHC: 34.5 g/dL (ref 31.0–35.5)
MCV: 84.5 fL (ref 78.0–100.0)
MONOCYTE #: 0.67 10*3/uL (ref 0.20–1.10)
MONOCYTE %: 8 %
MPV: 11.2 fL (ref 8.7–12.5)
NEUTROPHIL #: 5.63 10*3/uL (ref 1.50–7.70)
NEUTROPHIL %: 65 %
PLATELETS: 217 10*3/uL (ref 150–400)
RBC: 3.81 10*6/uL — ABNORMAL LOW (ref 4.50–6.10)
RDW-CV: 14.9 % (ref 11.5–15.5)
WBC: 8.7 10*3/uL (ref 3.7–11.0)

## 2023-01-12 LAB — BASIC METABOLIC PANEL
ANION GAP: 9 mmol/L (ref 4–13)
ANION GAP: 10 mmol/L (ref 4–13)
BUN/CREA RATIO: 15 (ref 6–22)
BUN/CREA RATIO: 15 (ref 6–22)
BUN: 14 mg/dL (ref 8–25)
BUN: 13 mg/dL (ref 8–25)
CALCIUM: 8.7 mg/dL (ref 8.6–10.3)
CALCIUM: 8.9 mg/dL (ref 8.6–10.3)
CHLORIDE: 104 mmol/L (ref 96–111)
CHLORIDE: 104 mmol/L (ref 96–111)
CO2 TOTAL: 25 mmol/L (ref 23–31)
CO2 TOTAL: 26 mmol/L (ref 23–31)
CREATININE: 0.92 mg/dL (ref 0.75–1.35)
CREATININE: 0.89 mg/dL (ref 0.75–1.35)
ESTIMATED GFR - MALE: >90 mL/min/BSA (ref >=60)
ESTIMATED GFR - MALE: >90 mL/min/BSA (ref >=60)
GLUCOSE: 127 mg/dL — ABNORMAL HIGH (ref 65–125)
GLUCOSE: 111 mg/dL (ref 65–125)
POTASSIUM: 3 mmol/L — ABNORMAL LOW (ref 3.5–5.1)
POTASSIUM: 3.2 mmol/L — ABNORMAL LOW (ref 3.5–5.1)
SODIUM: 138 mmol/L (ref 136–145)
SODIUM: 140 mmol/L (ref 136–145)

## 2023-01-12 LAB — PHOSPHORUS: PHOSPHORUS: 2.6 mg/dL (ref 2.3–4.0)

## 2023-01-12 LAB — POC BLOOD GLUCOSE (RESULTS)
GLUCOSE, POC: 119 mg/dl — ABNORMAL HIGH (ref 70–100)
GLUCOSE, POC: 138 mg/dl — ABNORMAL HIGH (ref 70–100)

## 2023-01-12 LAB — MAGNESIUM: MAGNESIUM: 2 mg/dL (ref 1.8–2.6)

## 2023-01-12 MED ORDER — POTASSIUM BICARBONATE-CITRIC ACID 20 MEQ EFFERVESCENT TABLET
40.0000 meq | EFFERVESCENT_TABLET | Freq: Two times a day (BID) | ORAL | 0 refills | Status: AC
Start: 2023-01-12 — End: 2023-01-15

## 2023-01-12 NOTE — Care Management Notes (Signed)
Discharge Recommendations/ Plan:Discharge [id number] (Patient/Family Member/other) (code 1)       01/12/23 0950   Assessment Details   Date of Care Management Update 01/12/23   Medicare Intent to Discharge Documentation   Discharge IMM give to: Not Applicable   Reason IMM "not applicable": Patient's Discharge date and time less than 48hrs from documented date and time of delviery for the "Admission IMM"   Care Management Plan   Discharge Planning Status discharge plan complete   Projected Discharge Date 01/12/23   Discharge plan discussed with: Patient   Discharge Needs Assessment   Discharge Facility/Level of Care Needs Home (Patient/Family Member/other)(code 1)   Transportation Available family or friend will provide   Referral Information   Admission Type inpatient     Patient discharged to home. Family to provide transportation     Yisroel Ramming, Charity fundraiser

## 2023-01-12 NOTE — Care Plan (Signed)
Problem: Adult Inpatient Plan of Care  Goal: Patient-Specific Goal (Individualized)  Outcome: Ongoing (see interventions/notes)  Flowsheets (Taken 01/11/2023 2100)  Individualized Care Needs: emotional support, education, k runs  Anxieties, Fears or Concerns: restless due to isolation, low k  Patient-Specific Goals (Include Timeframe): d/c when appropriate  Plan of Care Reviewed With: patient     Problem: Adult Inpatient Plan of Care  Goal: Plan of Care Review  Outcome: Ongoing (see interventions/notes)

## 2023-01-13 ENCOUNTER — Telehealth (INDEPENDENT_AMBULATORY_CARE_PROVIDER_SITE_OTHER): Payer: Self-pay | Admitting: Family Medicine

## 2023-01-13 LAB — ADULT ROUTINE BLOOD CULTURE, SET OF 2 BOTTLES (BACTERIA AND YEAST)
BLOOD CULTURE, ROUTINE: NO GROWTH
BLOOD CULTURE, ROUTINE: NO GROWTH

## 2023-01-13 NOTE — Telephone Encounter (Addendum)
Lm for pt to return call, need to do TOC

## 2023-01-13 NOTE — Telephone Encounter (Signed)
Transition of Care Contact Information  Discharge Date: 01/12/2023  Transition Facility Type--Hospital (Inpatient or Observation)  Facility Name--[facility]  Interactive Contact(s): Completed or attempted contact indicated by Date/Time  Completed Contact: 01/13/2023  2:20 PM  First Attempt Call: 01/13/2023  8:35 AM  Contact Method(s)-- Patient/Caregiver Telephone  Clinical Staff Name/Role who contacted--Sricharan Lacomb  Transition Assessment  Discharge Summary obtained?--Yes  How are you recovering?--Improving  Discharge Meds obtained?--Yes  Discharge medication changes reviewed?--Yes  Full Medication Reconciliation Completed?--Yes  Medication understanding --knows new medication(s)--knows purpose of medication  Medication Concerns?--No  Have everything needed for recovery?--Yes  Care Coordination:   Patient has transition follow-up appointment date and time?--Yes  Primary Care Transition Visit planned?--Yes  Specialist Transition Visit planned?--Yes  Patient/caregiver plans to attend transition visit?--Yes  Primary Follow-up Barrier  Interventions provided --reinforced discharge instructions--follow-up appointment date/time reinforced--follow-up appointment made--med list reviewed with patient--patient expresses understanding of follow-up plan  Home Health or DME ordered at discharge?--No  Clinician/Team notified?--No  Primary reason clinician notified?

## 2023-01-14 ENCOUNTER — Encounter (INDEPENDENT_AMBULATORY_CARE_PROVIDER_SITE_OTHER): Payer: Self-pay | Admitting: Family Medicine

## 2023-01-14 ENCOUNTER — Inpatient Hospital Stay (INDEPENDENT_AMBULATORY_CARE_PROVIDER_SITE_OTHER): Payer: Self-pay | Admitting: Family

## 2023-01-18 ENCOUNTER — Inpatient Hospital Stay (INDEPENDENT_AMBULATORY_CARE_PROVIDER_SITE_OTHER): Payer: Self-pay | Admitting: Internal Medicine

## 2023-01-18 NOTE — Progress Notes (Deleted)
PRIMARY CARE, [facility]  [address]  [place] [address]        Chief Complaint:  No chief complaint on file.    HPI:      Past Medical History:   Diagnosis Date    AKI (acute kidney injury) (CMS HCC) 01/09/2023    Arthralgia of hip 03/21/2018    Arthralgia of right upper arm 03/30/2016    Asthma     Asthma exacerbation 01/09/2023    Atherosclerosis of both carotid arteries 03/04/2020    Cervicalgia 03/21/2018    Chronic abdominal pain 10/04/2018    Chronic back pain 03/21/2018    Chronic chest pain 03/21/2018    Chronic insomnia     Constipation in male 09/13/2017    COVID-19 04/28/2021    COVID-19 04/28/2021    Diabetes mellitus, type 2 (CMS HCC)     Diverticulitis 01/09/2023    Elevated lactic acid level 01/09/2023    Esophageal reflux     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    Hepatic lesion 10/04/2018    Hyperlipidemia     Hypertension     Hypertriglyceridemia 05/15/2022    Hypokalemia 11/18/2020    Impingement syndrome of left shoulder 12/28/2017    Metabolic dysfunction-associated steatotic liver disease (MASLD) 10/04/2018    Mild persistent asthma without complication 03/21/2018    NAFL (nonalcoholic fatty liver)     Normal cardiac stress test 03/21/2018    Nosebleed     Obesity     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    Partial tear of common extensor tendon of elbow 12/13/2017    Pituitary tumor     Pre-diabetes     Rib pain 10/04/2018    Sepsis (CMS HCC) 01/09/2023    Sexual dysfunction     Tear of talofibular ligament 05/30/2013    RIGHT    Type 2 diabetes mellitus with other specified complication, unspecified whether long term insulin use (CMS HCC) 01/11/2023    Vitamin D deficiency      Past Surgical History:   Procedure Laterality Date    COLONOSCOPY      ELBOW SURGERY Left 12/2017    HX OTHER  2004     SPINAL FUSION,ANT,EA ADNL LEVEL c3-c7    HX OTHER Right     right great toe    HX SHOULDER SURGERY Bilateral     NOSE SURGERY       Family Medical History:       Problem  Relation (Age of Onset)    Cancer Mother, Sister          Social History     Tobacco Use    Smoking status: Never    Smokeless tobacco: Never   Vaping Use    Vaping status: Never Used   Substance Use Topics    Alcohol use: Not Currently    Drug use: Never     No Known Allergies    Current Outpatient Medications   Medication Sig    gabapentin (NEURONTIN) 600 mg Oral Tablet Take 1 Tablet (600 mg total) by mouth Three times a day Indications: neuropathic pain    Ibuprofen (MOTRIN) 800 mg Oral Tablet take 1 tablet by mouth three times a day if needed for pain Indications: joint damage causing pain and loss of function    metFORMIN (GLUCOPHAGE) 500 mg Oral Tablet Take 1 Tablet (500 mg total) by mouth Every morning before breakfast Indications: prevention of type 2 diabetes mellitus  mometasone-formoterol (DULERA) 200-5 mcg/dose Inhalation HFA Aerosol Inhaler Take 2 Puffs by inhalation Twice daily    omeprazole (PRILOSEC) 40 mg Oral Capsule, Delayed Release(E.C.) Take 1 Capsule (40 mg total) by mouth Once a day Indications: gastroesophageal reflux disease, heartburn    oxyCODONE-acetaminophen (PERCOCET) 10-325 mg Oral Tablet take 1 tablet by mouth three times a day if needed for pain    semaglutide (OZEMPIC) 0.25 mg or 0.5 mg (2 mg/3 mL) Subcutaneous Pen Injector Inject 0.25 mg under the skin Every 7 days for 28 days, THEN 0.5 mg Every 7 days for 14 days. Indications: weight loss management for an obese person.

## 2023-01-19 ENCOUNTER — Telehealth (INDEPENDENT_AMBULATORY_CARE_PROVIDER_SITE_OTHER): Payer: Self-pay | Admitting: Family Medicine

## 2023-01-19 NOTE — Telephone Encounter (Signed)
Copied from CRM #[id number]. Topic: Clinical - Question  >> Jan 19, 2023  3:06 PM Selena Batten wrote:  Steve Young fom [facility] called to schedule a Hospital f/u for D/C- 01/12/23 for Covid-19 and Diverticulitis.  The patient's PCP does not have any openings within 7 days.  Please contact the patient to schedule.    Thank you,  Steve Young

## 2023-01-27 ENCOUNTER — Encounter (INDEPENDENT_AMBULATORY_CARE_PROVIDER_SITE_OTHER): Payer: Self-pay

## 2023-01-27 ENCOUNTER — Other Ambulatory Visit: Payer: Self-pay

## 2023-01-27 ENCOUNTER — Ambulatory Visit (INDEPENDENT_AMBULATORY_CARE_PROVIDER_SITE_OTHER): Payer: Medicare (Managed Care)

## 2023-01-27 ENCOUNTER — Other Ambulatory Visit: Payer: Medicare (Managed Care)

## 2023-01-27 VITALS — BP 114/74 | HR 94 | Temp 97.0°F | Resp 18 | Ht 69.0 in | Wt 209.0 lb

## 2023-01-27 DIAGNOSIS — U071 COVID-19: Secondary | ICD-10-CM

## 2023-01-27 DIAGNOSIS — E669 Obesity, unspecified: Secondary | ICD-10-CM

## 2023-01-27 DIAGNOSIS — E876 Hypokalemia: Secondary | ICD-10-CM | POA: Insufficient documentation

## 2023-01-27 DIAGNOSIS — N179 Acute kidney failure, unspecified: Secondary | ICD-10-CM

## 2023-01-27 DIAGNOSIS — D649 Anemia, unspecified: Secondary | ICD-10-CM

## 2023-01-27 DIAGNOSIS — Z6830 Body mass index (BMI) 30.0-30.9, adult: Secondary | ICD-10-CM

## 2023-01-27 DIAGNOSIS — R7303 Prediabetes: Secondary | ICD-10-CM

## 2023-01-27 DIAGNOSIS — Z8616 Personal history of COVID-19: Secondary | ICD-10-CM

## 2023-01-27 DIAGNOSIS — Z09 Encounter for follow-up examination after completed treatment for conditions other than malignant neoplasm: Secondary | ICD-10-CM

## 2023-01-27 DIAGNOSIS — R197 Diarrhea, unspecified: Secondary | ICD-10-CM

## 2023-01-27 DIAGNOSIS — R630 Anorexia: Secondary | ICD-10-CM

## 2023-01-27 LAB — CBC WITH DIFF
BASOPHIL #: <0.1 10*3/uL (ref <=0.20)
BASOPHIL %: 1 %
EOSINOPHIL #: 0.18 10*3/uL (ref <=0.50)
EOSINOPHIL %: 2 %
HCT: 39.4 % (ref 38.9–52.0)
HGB: 13.1 g/dL — ABNORMAL LOW (ref 13.4–17.5)
IMMATURE GRANULOCYTE #: <0.1 10*3/uL (ref <0.10)
IMMATURE GRANULOCYTE %: 0 % (ref 0.0–1.0)
LYMPHOCYTE #: 2.46 10*3/uL (ref 1.00–4.80)
LYMPHOCYTE %: 31 %
MCH: 29.2 pg (ref 26.0–32.0)
MCHC: 33.2 g/dL (ref 31.0–35.5)
MCV: 87.9 fL (ref 78.0–100.0)
MONOCYTE #: 0.64 10*3/uL (ref 0.20–1.10)
MONOCYTE %: 8 %
MPV: 11.5 fL (ref 8.7–12.5)
NEUTROPHIL #: 4.52 10*3/uL (ref 1.50–7.70)
NEUTROPHIL %: 58 %
PLATELETS: 273 10*3/uL (ref 150–400)
RBC: 4.48 10*6/uL — ABNORMAL LOW (ref 4.50–6.10)
RDW-CV: 14.6 % (ref 11.5–15.5)
WBC: 7.9 10*3/uL (ref 3.7–11.0)

## 2023-01-27 LAB — COMPREHENSIVE METABOLIC PANEL, NON-FASTING
ALBUMIN: 3.9 g/dL (ref 3.4–4.8)
ALKALINE PHOSPHATASE: 71 U/L (ref 45–115)
ALT (SGPT): 31 U/L (ref <43)
ANION GAP: 10 mmol/L (ref 4–13)
AST (SGOT): 45 U/L — ABNORMAL HIGH (ref 11–34)
BILIRUBIN TOTAL: 1 mg/dL (ref 0.3–1.3)
BUN/CREA RATIO: 9 (ref 6–22)
BUN: 9 mg/dL (ref 8–25)
CALCIUM: 10.1 mg/dL (ref 8.6–10.3)
CHLORIDE: 106 mmol/L (ref 96–111)
CO2 TOTAL: 28 mmol/L (ref 23–31)
CREATININE: 1.01 mg/dL (ref 0.75–1.35)
ESTIMATED GFR - MALE: 84 mL/min/BSA (ref >=60)
GLUCOSE: 98 mg/dL (ref 65–125)
POTASSIUM: 4 mmol/L (ref 3.5–5.1)
PROTEIN TOTAL: 6.9 g/dL (ref 6.0–8.0)
SODIUM: 144 mmol/L (ref 136–145)

## 2023-01-27 NOTE — Progress Notes (Signed)
PRIMARY CARE, [facility]  [address] [place] [address]    Transitional Care Management Note    Name: Steve Young MRN:  [id number]   Date: 01/27/2023 Age: 62 y.o.     Chief Complaint: Hospital Follow Up and Hospital Discharge Transition       SUBJECTIVE:       Steve Young is a 62 y.o. male presenting today for follow-up after being discharged. The main problem requiring admission was COVID-19, AKI and hypokalemia.  Patient presented to the ER on 01/08/2023 with complaints of fever, vomiting, diarrhea, nausea and shortness a breath with exertion.  COVID-19 was positive.  His potassium was decreased at 2.3.  His creatinine was elevated at 1.51, BUN was normal at 13 in his GFR was decreased at 52.  Lactic acid was elevated at 2.6.  He was admitted for further evaluation.  He did have a CT of his abdomen/pelvis completed on 01/09/2023 which showed "subtle inflammation and wall thickening of the distal colon at the junction of the descending colon and sigmoid colon which may indicate a mild diverticulitis or low-grade early infectious or inflammatory colitis.  Hepatic steatosis."  He was started on breathing treatments, steroids, ceftriaxone and Flagyl.  ABG did show respiratory alkalosis.  He was also started on Paxlovid.  Diarrhea did resolve shortly after admission.  Antibiotics were stopped due to the resolution of diarrhea.  His potassium was replaced and was discharged on oral potassium with a repeat BMP in a few days.  He was discharged on 01/12/2023.  He was advised to follow up with GI and his PCP.     Patient presents today for a hospital discharge follow up.  He states that he does not feel much better after being discharge.  He states that he still does have some diarrhea symptoms.  He states that he does still have diarrhea symptoms.  He states that his diarrhea symptoms are intermittent.  He states that he does also have decreased appetite.  He states that he  did recently increase to Ozempic 0.5 mg weekly yesterday, 01/26/2023.  He states that he has lost a significant amount of weight since the start of Ozempic.  He states that he does want to be referred to GI for further evaluation of his diarrhea and decreased appetite.  He denies any nausea or vomiting.  He denies any CP, SOB or palpitations.  He denies any fever or chills.  He states he is taking medications as prescribed.      OBJECTIVE:   BP 114/74 (Site: Right Arm, Patient Position: Sitting, Cuff Size: Adult Large)   Pulse 94   Temp 36.1 C (97 F)   Resp 18   Ht 1.753 m (5\' 9" )   Wt 94.8 kg (209 lb)   SpO2 98%   BMI 30.86 kg/m          Transition of Care Contact Information  Discharge date: Discharge Date: 01/12/2023  Transition Facility Type--Hospital (Inpatient or Observation)  Facility Name--[facility] Interactive Contact(s):  Completed Contact: 01/13/2023  2:20 PM  First Attempt Call: 01/13/2023  8:35 AM  Contact Method(s)-- Patient/Caregiver Telephone  Clinical Staff Name/Role who contacted--Steve Young     Data Reviewed  Medication Reconciliation completed    Assessment and Plan  1. Hospital discharge follow-up  I reviewed all documentation available to me.  This included hospital notes, discharge summary, discharge instructions, imaging, lab results.  Reviewed these results with patient, answered all  questions.  She does have follow-up appointments made.  I did obtain discharge medications.  Medications reconciled.     2. COVID-19  Resolved.    3. AKI (acute kidney injury) (CMS HCC)  4. Hypokalemia  Patient did complete potassium supplementation.  I did review the patient's labs from discharge on 01/12/2023.  His potassium remained mildly decreased at 3.2.  His creatinine returned to normal at 0.89, BUN was normal at 13 and GFR returned to normal at greater than 90.  I did advise the patient I would like to recheck his potassium as well as his kidney functions to ensure that they are  normal.  He was agreeable and verbalized understanding.  - Comp Metabolic Panel-Non Fasting    5. Decreased appetite  6. Diarrhea, unspecified type  I did review the patient's above symptoms.  I did advise the patient that he should follow up with GI for further evaluation as he may need a colonoscopy to determine why he is having persistent diarrhea.  I did advise the patient that his previously prescribed Ozempic does cause GI side effects.  Referral ordered for [facility].  He verbalized understanding.  - Refer to External Provider    7. Low hemoglobin  I did review the patient's labs at discharge on 01/12/2023.  His hemoglobin was low at 11.8 in his hematocrit was low at 32.2.  I did advise the patient that I would like to recheck his CBC to ensure that his blood counts are returning to normal.  He was agreeable and verbalized understanding.  - CBC/DIFF    8. Prediabetes  9. Obesity (BMI 30.0-34.9)  I did review the patient's above prediabetes and weight.  His current weight is 209 lb.  His current BMI is 30.86.  Patient does currently take metformin 500 mg daily as well as Ozempic 0.5 mg weekly.  I did advise the patient that both of these medications do cause GI side effects, but they showed subside with long-term medication use.  He verbalized understanding.        Other transition actions (Optional) -: Discharge documentation was reviewed, Discharge documentation used to reconcile outpatient medication list, No pending tests or treatments., Education about medication(s) provided to patient/ family/ caregiver  , and Education provided to patient-family-caregiver about how to access care or advice       I did advise the patient to keep his already scheduled follow up with Steve Young on 02/07/2023, or sooner PRN. I did advise the patient I will call with the above lab results and intervene if needed.  He was agreeable and verbalized understanding.      Discussed plan of care with patient, answered all  questions.  Patient verbalized understanding.    This document was dictated using audio dictation.  Please excuse any spelling and/or grammar errors.      Return if symptoms worsen or fail to improve.    Talbot Grumbling, CRNP

## 2023-01-27 NOTE — Nursing Note (Signed)
[facility]      Venipuncture X 1  performed in office on left arm antecubital vein, dry pressure   dressing was applied to site and patient tolerated it well.  Specimen was centrifuged, aliquoted   as needed and specimen was labeled and packaged for transport.    My Rinke, MA  01/27/2023, 15:01

## 2023-01-27 NOTE — Nursing Note (Signed)
Pt is here for a hospital follow up. No concerns

## 2023-01-28 ENCOUNTER — Telehealth (INDEPENDENT_AMBULATORY_CARE_PROVIDER_SITE_OTHER): Payer: Self-pay | Admitting: Family Medicine

## 2023-01-28 NOTE — Telephone Encounter (Signed)
Copied from CRM [id number]. Topic: Clinical - Refill Request  >> Jan 28, 2023  4:23 PM Toni-Amy M wrote:  Jiles Crocker TROY called to request a prescription refill.     Preferred Pharmacy     [facility] [id number] [phone] Sydnee Cabal, [place] - [address]    [address] [place] [address]    Phone: [phone] Fax: [phone]    Hours: Not open 24 hours        semaglutide (OZEMPIC) 0.25 mg or 0.5 mg (2 mg/3 mL) Subcutaneous Pen Injector, Inject 0.25 mg under the skin Every 7 days for 28 days, THEN 0.5 mg Every 7 days for 14 days. Indications: weight loss management for an obese person.

## 2023-01-31 ENCOUNTER — Other Ambulatory Visit (INDEPENDENT_AMBULATORY_CARE_PROVIDER_SITE_OTHER): Payer: Self-pay | Admitting: Family Medicine

## 2023-01-31 DIAGNOSIS — E669 Obesity, unspecified: Secondary | ICD-10-CM

## 2023-01-31 DIAGNOSIS — R7303 Prediabetes: Secondary | ICD-10-CM

## 2023-01-31 DIAGNOSIS — K760 Fatty (change of) liver, not elsewhere classified: Secondary | ICD-10-CM

## 2023-01-31 MED ORDER — SEMAGLUTIDE 0.25 MG OR 0.5 MG (2 MG/3 ML) SUBCUTANEOUS PEN INJECTOR
0.5000 mg | PEN_INJECTOR | SUBCUTANEOUS | 0 refills | Status: DC
Start: 2023-01-31 — End: 2023-02-07

## 2023-01-31 NOTE — Telephone Encounter (Signed)
Last Visit:01/27/2023     Upcoming appointments: 02/07/2023           Conley Canal, RN  01/31/2023, 08:57

## 2023-02-01 ENCOUNTER — Encounter (INDEPENDENT_AMBULATORY_CARE_PROVIDER_SITE_OTHER): Payer: Self-pay | Admitting: Family Medicine

## 2023-02-07 ENCOUNTER — Ambulatory Visit (INDEPENDENT_AMBULATORY_CARE_PROVIDER_SITE_OTHER): Payer: Medicare (Managed Care) | Admitting: Family Medicine

## 2023-02-07 ENCOUNTER — Encounter (INDEPENDENT_AMBULATORY_CARE_PROVIDER_SITE_OTHER): Payer: Self-pay | Admitting: Family Medicine

## 2023-02-07 ENCOUNTER — Other Ambulatory Visit: Payer: Self-pay

## 2023-02-07 VITALS — BP 108/68 | HR 67 | Temp 97.7°F | Resp 18 | Ht 69.02 in | Wt 211.4 lb

## 2023-02-07 DIAGNOSIS — M79601 Pain in right arm: Secondary | ICD-10-CM

## 2023-02-07 DIAGNOSIS — D649 Anemia, unspecified: Secondary | ICD-10-CM | POA: Insufficient documentation

## 2023-02-07 DIAGNOSIS — Z7985 Long-term (current) use of injectable non-insulin antidiabetic drugs: Secondary | ICD-10-CM

## 2023-02-07 DIAGNOSIS — R202 Paresthesia of skin: Secondary | ICD-10-CM

## 2023-02-07 DIAGNOSIS — Z8616 Personal history of COVID-19: Secondary | ICD-10-CM | POA: Insufficient documentation

## 2023-02-07 DIAGNOSIS — Z8719 Personal history of other diseases of the digestive system: Secondary | ICD-10-CM | POA: Insufficient documentation

## 2023-02-07 DIAGNOSIS — M79602 Pain in left arm: Secondary | ICD-10-CM

## 2023-02-07 DIAGNOSIS — I10 Essential (primary) hypertension: Secondary | ICD-10-CM

## 2023-02-07 DIAGNOSIS — E669 Obesity, unspecified: Secondary | ICD-10-CM

## 2023-02-07 DIAGNOSIS — Z7984 Long term (current) use of oral hypoglycemic drugs: Secondary | ICD-10-CM

## 2023-02-07 DIAGNOSIS — J4540 Moderate persistent asthma, uncomplicated: Secondary | ICD-10-CM

## 2023-02-07 DIAGNOSIS — K760 Fatty (change of) liver, not elsewhere classified: Secondary | ICD-10-CM

## 2023-02-07 DIAGNOSIS — M5030 Other cervical disc degeneration, unspecified cervical region: Secondary | ICD-10-CM

## 2023-02-07 DIAGNOSIS — E1169 Type 2 diabetes mellitus with other specified complication: Secondary | ICD-10-CM

## 2023-02-07 DIAGNOSIS — E781 Pure hyperglyceridemia: Secondary | ICD-10-CM

## 2023-02-07 DIAGNOSIS — R7401 Elevation of levels of liver transaminase levels: Secondary | ICD-10-CM

## 2023-02-07 HISTORY — DX: Anemia, unspecified: D64.9

## 2023-02-07 MED ORDER — PSYLLIUM HUSK 0.4 GRAM CAPSULE
5.0000 | ORAL_CAPSULE | Freq: Two times a day (BID) | ORAL | 5 refills | Status: DC
Start: 2023-02-07 — End: 2023-04-12

## 2023-02-07 MED ORDER — SEMAGLUTIDE 0.25 MG OR 0.5 MG (2 MG/3 ML) SUBCUTANEOUS PEN INJECTOR
0.5000 mg | PEN_INJECTOR | SUBCUTANEOUS | 0 refills | Status: DC
Start: 2023-02-07 — End: 2023-03-02

## 2023-02-07 MED ORDER — ALBUTEROL SULFATE 2.5 MG/3 ML (0.083 %) SOLUTION FOR NEBULIZATION
2.5000 mg | INHALATION_SOLUTION | RESPIRATORY_TRACT | 3 refills | Status: DC | PRN
Start: 2023-02-07 — End: 2023-05-23

## 2023-02-07 NOTE — Progress Notes (Signed)
Chief Complaint   Patient presents with    Follow Up     Diabetes, hypertension, hyperlipidemia, asthma, fatty liver    Neck Pain    Dme Request     TENS unit, nebulizer     Last office visit with the provider:12/27/2022     SUBJECTIVE:  Steve Young is a 62 y.o. male,White. Patient has chronic neck pain radiating to his arms.  He states he used to have TENS unit which helped the pain.  He has done physical therapy but made the pain worse.  He is still trying to lose more weight.  He has recovered from COVID 19 infection.  He would like to have nebulizer.  He gets intermittent shortness of breath and wheezing from asthma. He does not have the following symptoms today: fever, chills, cough, nasal congestion, sore throat, abdominal pain, headache, dizziness, eye pain or irritation, epistaxis, chest discomfort, stroke symptoms, shortness of breath or palpitations. Side effects of medications: no current medication side effects.     Current Outpatient Medications   Medication Sig    Ibuprofen (MOTRIN) 800 mg Oral Tablet take 1 tablet by mouth three times a day if needed for pain Indications: joint damage causing pain and loss of function    metFORMIN (GLUCOPHAGE) 500 mg Oral Tablet Take 1 Tablet (500 mg total) by mouth Every morning before breakfast Indications: prevention of type 2 diabetes mellitus    mometasone-formoterol (DULERA) 200-5 mcg/dose Inhalation HFA Aerosol Inhaler Take 2 Puffs by inhalation Twice daily    omeprazole (PRILOSEC) 40 mg Oral Capsule, Delayed Release(E.C.) Take 1 Capsule (40 mg total) by mouth Once a day Indications: gastroesophageal reflux disease, heartburn    oxyCODONE-acetaminophen (PERCOCET) 10-325 mg Oral Tablet take 1 tablet by mouth three times a day if needed for pain    semaglutide (OZEMPIC) 0.25 mg or 0.5 mg (2 mg/3 mL) Subcutaneous Pen Injector Inject 0.5 mg under the skin Every 7 days for 28 days Indications: weight loss management for an obese person      No Known  Allergies    Past Surgical History:   Procedure Laterality Date    COLONOSCOPY      ELBOW SURGERY Left 12/2017    HX OTHER  2004     SPINAL FUSION,ANT,EA ADNL LEVEL c3-c7    HX OTHER Right     right great toe    HX SHOULDER SURGERY Bilateral     NOSE SURGERY       Social History     Tobacco Use    Smoking status: Never    Smokeless tobacco: Never   Vaping Use    Vaping status: Never Used   Substance Use Topics    Alcohol use: Not Currently    Drug use: Never     OBJECTIVE:   The patient appears to be in no acute distress.  Vitals: BP 108/68 (Site: Left Arm, Patient Position: Sitting)   Pulse 67   Temp 36.5 C (97.7 F)   Resp 18   Ht 1.753 m (5' 9.02")   Wt 95.9 kg (211 lb 6.4 oz)   SpO2 99%   BMI 31.20 kg/m   General: alert, no acute distress  Head: normocephalic, atraumatic  Eye: EOM intact, normal conjunctiva  Respiratory: Good diaphragmatic excursion. Lungs clear.  Heart: RRR  Extremities: Ambulatory, no edema  Neurologic: Awake, alert, oriented, speech clear and coherent  Skin: No jaundice, warm   Psychiatric: Cooperative, normal judgment    ASSESSMENT AND PLAN:  ICD-10-CM    1. Type 2 diabetes mellitus with obesity (CMS HCC)  (CMS HCC)  E11.69 MICROALBUMIN URINE, RANDOM  His last A1c was at goal and is also making good weight loss progress with Ozempic.    E66.9 Referral to External Provider (AMB) - ophthalmology     LIPID PANEL     semaglutide (OZEMPIC) 0.25 mg or 0.5 mg (2 mg/3 mL) Subcutaneous Pen Injector     THYROID STIMULATING HORMONE WITH FREE T4 REFLEX  Continue well-balanced ADA meals, avoid skipping meals to prevent hypoglycemic events, maintain good hydration with water, medication regimen compliance, checking feet regularly and to call if with problems, regular exercise as tolerated, weight management, control of blood pressure and cholesterol, regular eye checks.      2. Metabolic dysfunction-associated steatotic liver disease (MASLD)  K76.0 AST (SGOT)  Recommended to continue gradual  weight loss.     LIPID PANEL     semaglutide (OZEMPIC) 0.25 mg or 0.5 mg (2 mg/3 mL) Subcutaneous Pen Injector      3. Essential hypertension  I10 LIPID PANEL     THYROID STIMULATING HORMONE WITH FREE T4 REFLEX  Improved.  Continue lifestyle changes.      4. Moderate persistent asthma without complication  J45.40 DME - Nebulizer Supplies and Kit  Continue Dulera and albuterol every 4-6 hours as needed for shortness of breath or wheezing.     albuterol sulfate (PROVENTIL) 2.5 mg /3 mL (0.083 %) Inhalation nebulizer solution - new      5. Hypertriglyceridemia  E78.1 psyllium husk 0.4 gram Oral Capsule     LIPID PANEL     THYROID STIMULATING HORMONE WITH FREE T4 REFLEX      6. Mild anemia  D64.9 CBC  Will continue to monitor.      7. History of COVID-19  Z86.16 Has recovered from COVID 19 infection.      8. History of diverticulitis  Z87.19 psyllium husk 0.4 gram Oral Capsule - new  Advised to keep appointment with gastroenterologist.  Added soluble fiber supplement.  Maintain good hydration.      9. Elevated AST (SGOT)  R74.01 AST (SGOT)  Will continue to monitor.      10. DDD (degenerative disc disease), cervical  M50.30 Refer to  [facility] [facility] [place]     MRI Spine Cervical WO Contrast     DME - TENS Unit      11. Paresthesia and pain of both upper extremities  R20.2 MRI Spine Cervical WO Contrast  He will see neurosurgeon for further evaluation.    M79.601 DME - TENS Unit    M79.602 He can use TENS unit as needed for pain.  Continue pain management.        Data reviewed:   Lab Results   Component Value Date/Time    HA1C 5.7 10/21/2022 01:30 PM    BUN 9 01/27/2023 02:45 PM    CREATININE 1.01 01/27/2023 02:45 PM    GFR 84 01/27/2023 02:45 PM    GLUCOSE 98 01/27/2023 02:45 PM    CALCIUM 10.1 01/27/2023 02:45 PM    SODIUM 144 01/27/2023 02:45 PM    POTASSIUM 4.0 01/27/2023 02:45 PM    WBC 7.9 01/27/2023 02:45 PM    HGB 13.1 (L) 01/27/2023 02:45 PM    HCT 39.4 01/27/2023  02:45 PM    PLTCNT 273 01/27/2023 02:45 PM    AST 45 (H) 01/27/2023 02:45 PM    ALT 31 01/27/2023 02:45  PM    ALKPHOS 71 01/27/2023 02:45 PM    CHOLESTEROL 147 10/21/2022 01:30 PM    HDLCHOL 31 (L) 10/21/2022 01:30 PM    LDLCHOL 86 10/21/2022 01:30 PM    TRIG 175 (H) 10/21/2022 01:30 PM    TSH 0.562 06/11/2019 02:06 PM    MAGNESIUM 2.0 01/12/2023 01:09 AM     Recent Results (from the past [id number] hour(s))   [place] RT Upper Quadrant    Collection Time: 12/09/22  4:36 PM    Narrative    INDICATION:  Metabolic dysfunction-associated steatotic liver disease    TECHNIQUE:  Ultrasound of the Right Upper Quadrant.    COMPARISON:  CT chest and A&P 05/30/2022, [place] RUQ 04/04/2021, abdomen 10/03/2018    FINDINGS:      The pancreas is not well seen due to overlying bowel gas.      The liver demonstrates increased echogenicity.  There is a focal area of decreased echogenicity within the gallbladder fossa consistent with an area of focal fatty sparing.  There is no intrahepatic biliary dilatation.      The gallbladder is anechoic.  There are no stones.  There is no pericholecystic fluid or wall thickening.  The common bile duct measures 0.8 cm.  The sonographic Murphy's sign is negative.    The right kidney measures 11.4 cm in length.  Renal cortical echotexture is normal.  There is no hydronephrosis.  There are no stones.  There are no cysts.       Impression    Diffuse hepatic steatosis.    No cholelithiasis, gallbladder wall thickening, or biliary dilatation is appreciated.     No significant change from prior ultrasound dated 04/04/2021.       Recent Results (from the past [id number] hour(s))   CT ABDOMEN PELVIS W IV CONTRAST    Collection Time: 01/09/23  1:16 AM    Narrative    INDICATION:  Left lower quadrant abdominal pain with fever, nausea, vomiting and diarrhea.    TECHNIQUE:  CT of the abdomen and pelvis was performed with intravenous contrast.  Isovue 370 100 mL was administered intravenously.      Automated mA/kV exposure  control was utilized and patient examination was performed in strict accordance with principles of ALARA.    RADIATION AMOUNT:  658.40 mGy-cm.    COMPARISON:  [place] right upper quadrant 12/09/2022, CT chest, abdomen and pelvis 05/30/2022, [place] right upper quadrant 04/04/2021, US abdomen 10/03/2018.    FINDINGS:   Abdomen:  The lung bases are clear.  Cardiac size is normal.    Gallbladder is unremarkable.  The liver is slightly low in attenuation, suggesting mild chronic steatosis.  The pancreas, spleen, adrenal glands, and kidneys demonstrate no acute pathology.  Moderate fatty atrophy of the pancreas is noted.    There is no free air or lymph node enlargement.  Abdominal aorta is not aneurysmal.  Mild calcified atheromatous plaque is seen in the abdominal aorta and iliac arteries.    Pelvis:  There is no bowel wall thickening or obstruction.  Appendix appears normal.  Terminal ileum is unremarkable.  Diverticulosis is present in the descending colon and sigmoid colon.  There is subtle wall thickening of a short segment of the distal colon at the junction of the descending colon and sigmoid colon.  Minimal surrounding fat stranding is present.  There is no free fluid.  Lymph nodes are not enlarged.  Urinary bladder is unremarkable.    Skeleton:    There are  no acute fractures.  No suspicious bony lesions.      Impression    Subtle inflammation and wall thickening of the distal colon at the junction of the descending colon and sigmoid colon which may indicate a mild diverticulitis or a low-grade early infectious or inflammatory colitis.  No evidence of bowel obstruction or perforation.  Moderate pancreatic atrophy.  Hepatic steatosis.     Recent Results (from the past [id number] hour(s))   XR AP MOBILE CHEST    Collection Time: 01/08/23 11:47 PM    Narrative    INDICATION:  Shortness of breath, chest pain, cough, fever.      TECHNIQUE:  Frontal chest was obtained at 23:35 hours.    COMPARISON:  XR chest 08/18/2020,  03/14/2018.  CT chest/abdomen/pelvis 05/30/2022.     FINDINGS:    The cardiomediastinal silhouette is normal in size.  There is no consolidation or atelectasis in either lung.  There are no pleural effusions.  There is no pneumothorax.  No acute osseous process.  Cervical fusion hardware is noted.      Impression    No acute cardiopulmonary disease.       PROSTATE SPECIFIC ANTIGEN   Lab Results   Component Value Date/Time    PROSSPECAG 1.87 05/14/2022 06:23 AM        Orders Placed This Encounter    MRI Spine Cervical WO Contrast    AST (SGOT)    MICROALBUMIN URINE, RANDOM    LIPID PANEL    CBC    THYROID STIMULATING HORMONE WITH FREE T4 REFLEX    Referral to External Provider (AMB) - ophthalmology    Refer to  [facility] [facility] [place]    psyllium husk 0.4 gram Oral Capsule    albuterol sulfate (PROVENTIL) 2.5 mg /3 mL (0.083 %) Inhalation nebulizer solution    semaglutide (OZEMPIC) 0.25 mg or 0.5 mg (2 mg/3 mL) Subcutaneous Pen Injector    DME - Nebulizer Supplies and Kit    DME - TENS Unit     Recheck blood work before next office visit. Above plan was discussed with the patient and patient verbalized understanding. Patient agreed with above treatment and plan. Questions answered to patient's satisfaction. Risks, benefits, and alternatives to above treatment discussed with patient.   Follow-up in 6 weeks, sooner should new symptoms or problems arise.   He  was advised to contact the office if with any questions or concerns.    Manley Mason. Norberto Sorenson, MD  Note: This chart was transcribed using voice recognition software and may contain unintended word substitution or minor typographical errors.

## 2023-02-07 NOTE — Nursing Note (Signed)
6 week follow up

## 2023-02-07 NOTE — Nursing Note (Signed)
Patient's med list showed as most;y discontinued. Patient adamant he is still taking these meds daily.

## 2023-02-08 ENCOUNTER — Ambulatory Visit (INDEPENDENT_AMBULATORY_CARE_PROVIDER_SITE_OTHER): Payer: Self-pay | Admitting: Family Medicine

## 2023-02-08 ENCOUNTER — Telehealth (INDEPENDENT_AMBULATORY_CARE_PROVIDER_SITE_OTHER): Payer: Self-pay | Admitting: Family Medicine

## 2023-02-08 NOTE — Telephone Encounter (Signed)
Regarding: Dr Peter Minium, DME problem  ----- Message from Selena Batten sent at 02/08/2023  9:10 AM EDT -----  Copied From CRM [phone].Windell Moulding from [facility] called- there was a Tens Unit ordered from the office and  this does not go through insurance.    Thank you,  Rinaldo Ratel

## 2023-02-08 NOTE — Telephone Encounter (Signed)
Called and spoke with patient about [facility] calling and saying that insurance does not pay for tens unit. He is aware and said he will call and talk to them

## 2023-03-01 ENCOUNTER — Other Ambulatory Visit (INDEPENDENT_AMBULATORY_CARE_PROVIDER_SITE_OTHER): Payer: Self-pay | Admitting: Family Medicine

## 2023-03-01 DIAGNOSIS — E669 Obesity, unspecified: Secondary | ICD-10-CM

## 2023-03-01 DIAGNOSIS — K760 Fatty (change of) liver, not elsewhere classified: Secondary | ICD-10-CM

## 2023-03-02 ENCOUNTER — Ambulatory Visit (HOSPITAL_COMMUNITY): Payer: Self-pay

## 2023-03-02 ENCOUNTER — Encounter (INDEPENDENT_AMBULATORY_CARE_PROVIDER_SITE_OTHER): Payer: Self-pay | Admitting: Family Medicine

## 2023-03-02 NOTE — Telephone Encounter (Signed)
Last Visit:02/07/2023     Upcoming appointments: 03/24/2023           Damian Leavell, MA  03/02/2023, 07:53

## 2023-03-04 ENCOUNTER — Ambulatory Visit (INDEPENDENT_AMBULATORY_CARE_PROVIDER_SITE_OTHER): Payer: Self-pay | Admitting: Family Medicine

## 2023-03-04 NOTE — Telephone Encounter (Signed)
Regarding: Clinical Question  ----- Message from Verdon Cummins sent at 03/03/2023  4:51 PM EDT -----  Copied From CRM #[id number].Young, Steve TROY called with a clinical question.   Requesting to know if their "breathing machine" has been ordered to [place]...  Please advise.

## 2023-03-04 NOTE — Telephone Encounter (Signed)
Sent to [place] on 02/08/23

## 2023-03-08 ENCOUNTER — Other Ambulatory Visit (INDEPENDENT_AMBULATORY_CARE_PROVIDER_SITE_OTHER): Payer: Self-pay | Admitting: Family Medicine

## 2023-03-08 NOTE — Telephone Encounter (Signed)
Last Visit:02/07/2023     Upcoming appointments: 03/24/2023           Sheriann Newmann, MA  03/08/2023, 16:11

## 2023-03-09 ENCOUNTER — Ambulatory Visit (INDEPENDENT_AMBULATORY_CARE_PROVIDER_SITE_OTHER): Payer: Self-pay | Admitting: Family Medicine

## 2023-03-09 DIAGNOSIS — J4540 Moderate persistent asthma, uncomplicated: Secondary | ICD-10-CM

## 2023-03-09 NOTE — Telephone Encounter (Signed)
Regarding: Clinical Question  ----- Message from Ronn Melena sent at 03/09/2023  1:56 PM EDT -----  Copied From CRM [phone].Elijah Birk and Ronalee Red is requesting a script for a nebulizer machine to be faxed to [phone]

## 2023-03-09 NOTE — Telephone Encounter (Signed)
Regarding: Clinical Question  ----- Message from [facility] sent at 03/09/2023 11:33 AM EDT -----  Copied From CRM #[id number].Ginger called with a clinical question.   Requesting most recently office note regarding pt asthma.  Please fax to [phone]

## 2023-03-09 NOTE — Telephone Encounter (Signed)
re-faxed

## 2023-03-09 NOTE — Telephone Encounter (Signed)
faxed

## 2023-03-09 NOTE — Telephone Encounter (Signed)
pended

## 2023-03-09 NOTE — Telephone Encounter (Signed)
Regarding: Clinical Question  ----- Message from Ramona P sent at 03/08/2023  4:23 PM EDT -----  Copied From CRM #[id number].[name], Steve Young called with a clinical question.     Steve Young and Steve Young is stating that they have not gotten anything for the nebulizer. Can this be re-faxed. Please advise Pt when this is sent.

## 2023-03-18 ENCOUNTER — Encounter (INDEPENDENT_AMBULATORY_CARE_PROVIDER_SITE_OTHER): Payer: Self-pay | Admitting: Family Medicine

## 2023-03-23 ENCOUNTER — Telehealth (INDEPENDENT_AMBULATORY_CARE_PROVIDER_SITE_OTHER): Payer: Self-pay | Admitting: Family Medicine

## 2023-03-23 NOTE — Telephone Encounter (Signed)
Talked to patient today. He was already informed and changed insurance plans. As of 05-18-2023 he will have a different plan that we accept

## 2023-03-24 ENCOUNTER — Encounter (INDEPENDENT_AMBULATORY_CARE_PROVIDER_SITE_OTHER): Payer: Self-pay | Admitting: Family Medicine

## 2023-03-29 ENCOUNTER — Inpatient Hospital Stay
Admission: RE | Admit: 2023-03-29 | Discharge: 2023-03-29 | Disposition: A | Discharge status: Home / Self Care | Payer: Medicare (Managed Care) | Source: Ambulatory Visit | Attending: Family Medicine | Admitting: Family Medicine

## 2023-03-29 ENCOUNTER — Encounter (INDEPENDENT_AMBULATORY_CARE_PROVIDER_SITE_OTHER): Payer: Self-pay | Admitting: Internal Medicine

## 2023-03-29 ENCOUNTER — Other Ambulatory Visit (HOSPITAL_BASED_OUTPATIENT_CLINIC_OR_DEPARTMENT_OTHER): Payer: Medicare (Managed Care)

## 2023-03-29 ENCOUNTER — Ambulatory Visit (INDEPENDENT_AMBULATORY_CARE_PROVIDER_SITE_OTHER): Payer: Medicare (Managed Care) | Admitting: Internal Medicine

## 2023-03-29 ENCOUNTER — Other Ambulatory Visit: Payer: Self-pay

## 2023-03-29 ENCOUNTER — Other Ambulatory Visit (INDEPENDENT_AMBULATORY_CARE_PROVIDER_SITE_OTHER): Payer: Self-pay | Admitting: Family Medicine

## 2023-03-29 VITALS — BP 128/90 | HR 64 | Temp 97.9°F | Resp 17 | Ht 69.02 in | Wt 204.6 lb

## 2023-03-29 DIAGNOSIS — R202 Paresthesia of skin: Secondary | ICD-10-CM | POA: Insufficient documentation

## 2023-03-29 DIAGNOSIS — E876 Hypokalemia: Secondary | ICD-10-CM

## 2023-03-29 DIAGNOSIS — M79601 Pain in right arm: Secondary | ICD-10-CM | POA: Insufficient documentation

## 2023-03-29 DIAGNOSIS — M5030 Other cervical disc degeneration, unspecified cervical region: Secondary | ICD-10-CM | POA: Insufficient documentation

## 2023-03-29 DIAGNOSIS — I10 Essential (primary) hypertension: Secondary | ICD-10-CM

## 2023-03-29 DIAGNOSIS — E669 Obesity, unspecified: Secondary | ICD-10-CM | POA: Insufficient documentation

## 2023-03-29 DIAGNOSIS — R7401 Elevation of levels of liver transaminase levels: Secondary | ICD-10-CM | POA: Insufficient documentation

## 2023-03-29 DIAGNOSIS — D649 Anemia, unspecified: Secondary | ICD-10-CM

## 2023-03-29 DIAGNOSIS — E781 Pure hyperglyceridemia: Secondary | ICD-10-CM

## 2023-03-29 DIAGNOSIS — K760 Fatty (change of) liver, not elsewhere classified: Secondary | ICD-10-CM

## 2023-03-29 DIAGNOSIS — M79602 Pain in left arm: Secondary | ICD-10-CM | POA: Insufficient documentation

## 2023-03-29 DIAGNOSIS — E1169 Type 2 diabetes mellitus with other specified complication: Secondary | ICD-10-CM | POA: Insufficient documentation

## 2023-03-29 DIAGNOSIS — E66811 Obesity, class 1: Secondary | ICD-10-CM

## 2023-03-29 DIAGNOSIS — K2960 Other gastritis without bleeding: Secondary | ICD-10-CM

## 2023-03-29 DIAGNOSIS — R7303 Prediabetes: Secondary | ICD-10-CM

## 2023-03-29 DIAGNOSIS — M170 Bilateral primary osteoarthritis of knee: Secondary | ICD-10-CM | POA: Insufficient documentation

## 2023-03-29 DIAGNOSIS — M792 Neuralgia and neuritis, unspecified: Secondary | ICD-10-CM

## 2023-03-29 DIAGNOSIS — R200 Anesthesia of skin: Secondary | ICD-10-CM

## 2023-03-29 LAB — COMPREHENSIVE METABOLIC PNL, FASTING
ALBUMIN: 3.8 g/dL (ref 3.4–4.8)
ALKALINE PHOSPHATASE: 76 U/L (ref 45–115)
ALT (SGPT): 14 U/L (ref <43)
ANION GAP: 8 mmol/L (ref 4–13)
AST (SGOT): 26 U/L (ref 11–34)
BILIRUBIN TOTAL: 1 mg/dL (ref 0.3–1.3)
BUN/CREA RATIO: 14 (ref 6–22)
BUN: 13 mg/dL (ref 8–25)
CALCIUM: 9.8 mg/dL (ref 8.6–10.3)
CHLORIDE: 103 mmol/L (ref 96–111)
CO2 TOTAL: 31 mmol/L (ref 23–31)
CREATININE: 0.93 mg/dL (ref 0.75–1.35)
ESTIMATED GFR - MALE: >90 mL/min/BSA (ref >=60)
GLUCOSE: 115 mg/dL — ABNORMAL HIGH (ref 70–99)
POTASSIUM: 3.2 mmol/L — ABNORMAL LOW (ref 3.5–5.1)
PROTEIN TOTAL: 6.7 g/dL (ref 6.0–8.0)
SODIUM: 142 mmol/L (ref 136–145)

## 2023-03-29 LAB — LIPID PANEL
CHOL/HDL RATIO: 3.7
CHOLESTEROL: 107 mg/dL (ref 100–200)
HDL CHOL: 29 mg/dL — ABNORMAL LOW (ref >=50)
LDL CALC: 57 mg/dL (ref <100)
NON-HDL: 78 mg/dL (ref <=190)
TRIGLYCERIDES: 112 mg/dL (ref <150)
VLDL CALC: 16 mg/dL (ref <30)

## 2023-03-29 LAB — CBC
HCT: 40.3 % (ref 38.9–52.0)
HGB: 13.8 g/dL (ref 13.4–17.5)
MCH: 30 pg (ref 26.0–32.0)
MCHC: 34.2 g/dL (ref 31.0–35.5)
MCV: 87.6 fL (ref 78.0–100.0)
MPV: 11.7 fL (ref 8.7–12.5)
PLATELETS: 269 10*3/uL (ref 150–400)
RBC: 4.6 10*6/uL (ref 4.50–6.10)
RDW-CV: 13.7 % (ref 11.5–15.5)
WBC: 7.7 10*3/uL (ref 3.7–11.0)

## 2023-03-29 LAB — MICROALBUMIN URINE, RANDOM
CREATININE RANDOM URINE: 236 mg/dL
MICROALBUMIN RANDOM URINE: 0.9 mg/dL
MICROALBUMIN/CREATININE: 0.004 mg/mg (ref <2.000)

## 2023-03-29 LAB — THYROID STIMULATING HORMONE WITH FREE T4 REFLEX: TSH: 0.979 u[IU]/mL (ref 0.350–4.940)

## 2023-03-29 MED ORDER — POTASSIUM CHLORIDE ER 10 MEQ CAPSULE,EXTENDED RELEASE
10.0000 meq | ORAL_CAPSULE | Freq: Two times a day (BID) | ORAL | 5 refills | Status: DC
Start: 2023-03-29 — End: 2023-10-27

## 2023-03-29 NOTE — Nursing Note (Signed)
Right Arm Pain
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
## 2023-03-30 ENCOUNTER — Ambulatory Visit (INDEPENDENT_AMBULATORY_CARE_PROVIDER_SITE_OTHER): Payer: Self-pay | Admitting: Family Medicine

## 2023-03-30 ENCOUNTER — Encounter (INDEPENDENT_AMBULATORY_CARE_PROVIDER_SITE_OTHER): Payer: Self-pay | Admitting: Family Medicine

## 2023-03-30 DIAGNOSIS — M4802 Spinal stenosis, cervical region: Secondary | ICD-10-CM | POA: Insufficient documentation

## 2023-03-30 DIAGNOSIS — R202 Paresthesia of skin: Secondary | ICD-10-CM

## 2023-03-30 DIAGNOSIS — Z9889 Other specified postprocedural states: Secondary | ICD-10-CM

## 2023-03-30 DIAGNOSIS — M79602 Pain in left arm: Secondary | ICD-10-CM

## 2023-03-30 DIAGNOSIS — M5033 Other cervical disc degeneration, cervicothoracic region: Secondary | ICD-10-CM

## 2023-03-30 DIAGNOSIS — M5030 Other cervical disc degeneration, unspecified cervical region: Secondary | ICD-10-CM

## 2023-03-30 DIAGNOSIS — M79601 Pain in right arm: Secondary | ICD-10-CM

## 2023-03-30 HISTORY — DX: Spinal stenosis, cervical region: M48.02

## 2023-03-30 NOTE — Telephone Encounter (Signed)
MRI yesterday showed mild to moderate narrowing of spine not affecting the cord. Thank you.

## 2023-03-30 NOTE — Telephone Encounter (Signed)
Pt was made aware.  

## 2023-03-30 NOTE — Telephone Encounter (Signed)
Pt calling in for test results

## 2023-03-30 NOTE — Telephone Encounter (Signed)
Regarding: Returning Call  ----- Message from Hillery Jacks T sent at 03/30/2023  3:42 PM EST -----  Copied From CRM [phone].Steve Young is returning a call they received from the staff regarding test results

## 2023-04-04 ENCOUNTER — Ambulatory Visit: Payer: Medicare (Managed Care) | Attending: ORTHOPAEDIC SURGERY | Admitting: ORTHOPAEDIC SURGERY

## 2023-04-04 ENCOUNTER — Inpatient Hospital Stay (HOSPITAL_BASED_OUTPATIENT_CLINIC_OR_DEPARTMENT_OTHER)
Admission: RE | Admit: 2023-04-04 | Discharge: 2023-04-04 | Disposition: A | Discharge status: Home / Self Care | Payer: Medicare (Managed Care) | Source: Ambulatory Visit

## 2023-04-04 ENCOUNTER — Encounter (HOSPITAL_COMMUNITY): Payer: Self-pay | Admitting: ORTHOPAEDIC SURGERY

## 2023-04-04 ENCOUNTER — Other Ambulatory Visit: Payer: Self-pay

## 2023-04-04 VITALS — Ht 69.0 in | Wt 204.0 lb

## 2023-04-04 DIAGNOSIS — R29898 Other symptoms and signs involving the musculoskeletal system: Secondary | ICD-10-CM | POA: Insufficient documentation

## 2023-04-04 DIAGNOSIS — M5030 Other cervical disc degeneration, unspecified cervical region: Secondary | ICD-10-CM | POA: Insufficient documentation

## 2023-04-04 DIAGNOSIS — R202 Paresthesia of skin: Secondary | ICD-10-CM | POA: Insufficient documentation

## 2023-04-04 DIAGNOSIS — R200 Anesthesia of skin: Secondary | ICD-10-CM | POA: Insufficient documentation

## 2023-04-04 MED ORDER — METHYLPREDNISOLONE 4 MG TABLETS IN A DOSE PACK
ORAL_TABLET | ORAL | 0 refills | Status: DC
Start: 2023-04-04 — End: 2023-04-12
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
## 2023-04-06 ENCOUNTER — Encounter (HOSPITAL_COMMUNITY): Payer: Self-pay

## 2023-04-06 ENCOUNTER — Ambulatory Visit (HOSPITAL_COMMUNITY): Payer: Self-pay

## 2023-04-12 ENCOUNTER — Ambulatory Visit: Payer: Medicare (Managed Care) | Attending: Cardiovascular Disease | Admitting: Cardiovascular Disease

## 2023-04-12 ENCOUNTER — Other Ambulatory Visit: Payer: Self-pay

## 2023-04-12 ENCOUNTER — Encounter (INDEPENDENT_AMBULATORY_CARE_PROVIDER_SITE_OTHER): Payer: Self-pay | Admitting: Cardiovascular Disease

## 2023-04-12 VITALS — BP 118/74 | HR 74 | Ht 69.0 in | Wt 213.0 lb

## 2023-04-12 DIAGNOSIS — R079 Chest pain, unspecified: Secondary | ICD-10-CM | POA: Insufficient documentation

## 2023-04-12 DIAGNOSIS — E782 Mixed hyperlipidemia: Secondary | ICD-10-CM | POA: Insufficient documentation

## 2023-04-12 DIAGNOSIS — R42 Dizziness and giddiness: Secondary | ICD-10-CM | POA: Insufficient documentation

## 2023-04-12 DIAGNOSIS — I10 Essential (primary) hypertension: Secondary | ICD-10-CM | POA: Insufficient documentation

## 2023-04-12 DIAGNOSIS — R0609 Other forms of dyspnea: Secondary | ICD-10-CM | POA: Insufficient documentation

## 2023-04-12 LAB — ECG 12 LEAD
Atrial Rate: 70 {beats}/min
Calculated P Axis: 44 degrees
Calculated R Axis: 19 degrees
Calculated T Axis: 23 degrees
PR Interval: 200 ms
QRS Duration: 86 ms
QT Interval: 370 ms
QTC Calculation: 399 ms
Ventricular rate: 70 {beats}/min

## 2023-04-12 NOTE — Progress Notes (Signed)
Steve Young is a 62 y.o. male seen in the office on 04/12/2023    Chief Complaint   Patient presents with    New Patient    Hypertension         Patient Active Problem List   Diagnosis    Chest pain    Primary hypertension    Mixed hyperlipidemia    Primary osteoarthritis of both knees    Obesity (BMI 30.0-34.9)    Primary insomnia    Reflux gastritis    Vitamin D deficiency    Chronic musculoskeletal pain    Metabolic dysfunction-associated steatotic liver disease (MASLD)    Drug-induced constipation    Moderate persistent asthma without complication    Psychophysiological insomnia    PTSD (post-traumatic stress disorder)    Pituitary tumor    NAFL (nonalcoholic fatty liver)    Atherosclerosis of both carotid arteries    Hypokalemia    DDD (degenerative disc disease), cervical    Lightheadedness    Moderate persistent asthma with acute exacerbation    Hypertriglyceridemia    Elevated lactic acid level    Mood disorder (CMS HCC)    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  At that time the patient had COVID and complications related to that.  The note from the hospitalist just mentions hypertension but no other cardiac issue.  The patient was referred here by the hospitalist who took care of him in the hospital.  The patient himself states that he has been having lightheadedness, dyspnea, and chest discomfort.  Patient states that he often feels lightheaded and dizzy and especially whenever he is in the shower washing his hair.  He has not had syncope.  He does not have palpitations associated with hte lightheadedness.  He does have dyspnea on exertion which he states is  getting worse.  He is still able to do his usual activities but finds himself getting short of breath.  Also complains of a chest discomfort which he describes as if someone is "pushing on my chest".  This chest discomfort occurs at any time and is not related to shortness of breath nor to physical activity.  The chest discomfort can last for about an hour at a time and the patient actually seems get relief by punching himself in the chest.  He denies any PND, orthopnea, or leg edema.  He is reasonably active.  This patient did have a treadmill exercise test which I had performed in 2021.  He reached 88% of his predicted maximal heart rate and 10.3 Mets of work in the test was negative.        Current Outpatient Medications   Medication Sig    albuterol sulfate (PROVENTIL) 2.5 mg /3 mL (0.083 %) Inhalation nebulizer solution Take 3 mL (2.5 mg total) by nebulization Every 4 hours as needed for Wheezing Indications: asthma attack, bronchospasm prevention    budesonide-formoteroL (SYMBICORT) 160-4.5 mcg/actuation Inhalation oral inhaler Take 2 Puffs by inhalation Twice daily    gabapentin (NEURONTIN) 600 mg Oral Tablet Take 1 Tablet (600  mg total) by mouth Three times a day    Ibuprofen (MOTRIN) 800 mg Oral Tablet take 1 tablet by mouth three times a day if needed for pain Indications: joint damage causing pain and loss of function    metFORMIN (GLUCOPHAGE) 500 mg Oral Tablet Take 1 Tablet (500 mg total) by mouth Every morning before breakfast Indications: prevention of type 2 diabetes mellitus    mometasone-formoterol (DULERA) 200-5 mcg/dose Inhalation HFA Aerosol Inhaler Take 2 Puffs by inhalation Twice daily    omeprazole (PRILOSEC) 40 mg Oral Capsule, Delayed Release(E.C.) Take 1 Capsule (40 mg total) by mouth Once a day Indications: gastroesophageal reflux disease, heartburn    oxyCODONE-acetaminophen (PERCOCET) 10-325 mg Oral Tablet take 1 tablet by mouth three times a day if needed for pain    potassium chloride  (MICRO K) 10 mEq Oral Capsule, Sustained Release Take 1 Capsule (10 mEq total) by mouth Twice daily Indications: low amount of potassium in the blood, prevention of low potassium in the blood    semaglutide (OZEMPIC) 0.25 mg or 0.5 mg (2 mg/3 mL) Subcutaneous Pen Injector inject 0.5 milligram UNDER THE SKIN EVERY 7 DAYS FOR 28 DAYS       No Known Allergies  Family Medical History:       Problem Relation (Age of Onset)    Cancer Mother, Sister    Gout Father    Heart Disease Mother, Sister, Nephew        There does appear to be a family history of heart disease.  Father likely had heart problems but this is uncertain.  Mother had heart disease.  A sister has had prior heart surgery.  And nephew has a weak heart that is "functioning only 7%".    Social History     Socioeconomic History    Marital status: Divorced    Number of children: 1   Occupational History    Occupation: retired: ship yards, drove Marine scientist    Tobacco Use    Smoking status: Never    Smokeless tobacco: Never   Vaping Use    Vaping status: Never Used   Substance and Sexual Activity    Alcohol use: Not Currently    Drug use: Never   Social History Narrative    Right handed.      Social Determinants of Health     Financial Resource Strain: Low Risk  (01/09/2023)    Financial Resource Strain     SDOH Financial: No   Transportation Needs: Low Risk  (01/09/2023)    Transportation Needs     SDOH Transportation: No   Social Connections: Low Risk  (01/09/2023)    Social Connections     SDOH Social Isolation: 5 or more times a week   Intimate Partner Violence: Low Risk  (09/06/2022)    Intimate Partner Violence     SDOH Domestic Violence: No   Housing Stability: Low Risk  (01/09/2023)    Housing Stability     SDOH Housing Situation: I have housing.     SDOH Housing Worry: No   No tobacco use.  No significant alcohol.  Lives with a family member.      Physical Exam:  BP 118/74   Pulse 74   Ht 1.753 m (5\' 9" )   Wt 96.6 kg (213 lb)   SpO2 96%   BMI 31.45  kg/m       Body mass index is 31.45 kg/m.  Wt Readings from Last 5 Encounters:   04/12/23  96.6 kg (213 lb)   04/04/23 92.5 kg (204 lb)   03/29/23 92.8 kg (204 lb 9.6 oz)   02/07/23 95.9 kg (211 lb 6.4 oz)   01/27/23 94.8 kg (209 lb)     The patient is in no distress. Skin is warm and dry.  Sclera nonicteric.  Conjunctiva normal.  There is no neck vein distention. No carotid bruits heard. The lungs are clear. The heart is regular without any significant murmur, gallop,rub,or click. The abdomen is soft and nontender. The aorta is not palpable. Extremities show no significant edema.  No clubbing or cyanosis.  Peripheral pulses intact.  Gait normal.  No focal neurologic deficit.    PERTINENT DIAGNOSTICS:  EKG today shows sinus rhythm with a rate of 70 and low precordial voltage.  ST-T abnormalities are improved compared to a prior tracing.  Lipid panel March 29, 2023 showed a cholesterol 107, triglyceride 112, HDL 29, LDL 57.  In 2023 the cholesterol was 161 with triglyceride of 225, HDL 29, LDL 94.  TSH normal.  Chem 12 showed a glucose of 115 and a creatinine of 0.93.    IMPRESSION:  Assessment/Plan   1. Primary hypertension    2. Mixed hyperlipidemia    3. DOE (dyspnea on exertion)    4. Chest pain, unspecified type    5. Lightheadedness          PLAN:  I suspect that this patient does not have any significant heart disease but based on his symptoms, further evaluation is warranted.  I have recommended a treadmill exercise test, cardiac event monitor, and an echocardiogram.  Actually an echocardiogram had been ordered previously by his other physicians but this had not yet been accomplished.  I have reordered this today.  No change was made in his treatment today.  I asked him to return in 6 months but we will know the result of his cardiac testing in the meantime and proceed accordingly.  A copy of this note will go to his referring physician as well as to his PCP.  Orders Placed This Encounter    ECG, In  Clinc, Same Day ([facility])    7 DAY EXTENDED HOLTER MONITOR    TRANSTHORACIC ECHOCARDIOGRAM - ADULT    STRESS TEST - ADULT

## 2023-04-25 ENCOUNTER — Other Ambulatory Visit (INDEPENDENT_AMBULATORY_CARE_PROVIDER_SITE_OTHER): Payer: Self-pay | Admitting: Family Medicine

## 2023-04-25 NOTE — Telephone Encounter (Signed)
Last Visit:03/29/2023     Upcoming appointments: 09/01/2023           Daniesha Driver, MA  04/25/2023, 15:48

## 2023-04-27 ENCOUNTER — Ambulatory Visit (HOSPITAL_COMMUNITY): Payer: Self-pay

## 2023-04-29 ENCOUNTER — Other Ambulatory Visit (INDEPENDENT_AMBULATORY_CARE_PROVIDER_SITE_OTHER): Payer: Self-pay | Admitting: Family Medicine

## 2023-04-29 DIAGNOSIS — M170 Bilateral primary osteoarthritis of knee: Secondary | ICD-10-CM

## 2023-04-29 DIAGNOSIS — M5030 Other cervical disc degeneration, unspecified cervical region: Secondary | ICD-10-CM

## 2023-04-29 NOTE — Telephone Encounter (Signed)
Last Visit:03/29/2023     Upcoming appointments: 09/01/2023           Mileidy Atkin Hunchuck[place], MA  04/29/2023, 16:17

## 2023-05-15 ENCOUNTER — Other Ambulatory Visit: Payer: Self-pay

## 2023-05-17 ENCOUNTER — Ambulatory Visit (HOSPITAL_COMMUNITY): Payer: Self-pay

## 2023-05-21 ENCOUNTER — Other Ambulatory Visit (INDEPENDENT_AMBULATORY_CARE_PROVIDER_SITE_OTHER): Payer: Self-pay | Admitting: Family Medicine

## 2023-05-21 DIAGNOSIS — J4540 Moderate persistent asthma, uncomplicated: Secondary | ICD-10-CM

## 2023-05-22 NOTE — Telephone Encounter (Signed)
Last Visit:03/29/2023     Upcoming appointments: 09/01/2023           Steve Ludington Hunchuck[place], MA  05/22/2023, 21:36

## 2023-05-23 ENCOUNTER — Ambulatory Visit (INDEPENDENT_AMBULATORY_CARE_PROVIDER_SITE_OTHER): Payer: Self-pay | Admitting: Family Medicine

## 2023-05-23 DIAGNOSIS — E669 Obesity, unspecified: Secondary | ICD-10-CM

## 2023-05-23 DIAGNOSIS — K760 Fatty (change of) liver, not elsewhere classified: Secondary | ICD-10-CM

## 2023-05-23 DIAGNOSIS — E1169 Type 2 diabetes mellitus with other specified complication: Secondary | ICD-10-CM

## 2023-05-23 NOTE — Telephone Encounter (Signed)
Regarding: Refill Request  ----- Message from [facility] H sent at 05/23/2023  2:48 PM EST -----  Copied From CRM #[id number].Steve Young, Steve Young called to request a prescription refill.     semaglutide (OZEMPIC) 0.25 mg or 0.5 mg (2 mg/3 mL) Subcutaneous Pen Injector    [id number] [place] - [address]    [address] [place] [address]    Phone: [phone] Fax: [phone]    Hours: Not open 24 hours

## 2023-05-27 ENCOUNTER — Other Ambulatory Visit (INDEPENDENT_AMBULATORY_CARE_PROVIDER_SITE_OTHER): Payer: Self-pay | Admitting: Family Medicine

## 2023-05-27 NOTE — Telephone Encounter (Signed)
Last Visit:03/29/2023    Upcoming appointments: 09/01/2023          Gwendolyn Nishi, MA  05/27/2023, 15:39

## 2023-06-01 ENCOUNTER — Other Ambulatory Visit (HOSPITAL_COMMUNITY): Payer: Self-pay

## 2023-06-01 ENCOUNTER — Ambulatory Visit (INDEPENDENT_AMBULATORY_CARE_PROVIDER_SITE_OTHER): Payer: Self-pay | Admitting: Family Medicine

## 2023-06-01 DIAGNOSIS — R202 Paresthesia of skin: Secondary | ICD-10-CM

## 2023-06-01 DIAGNOSIS — R29898 Other symptoms and signs involving the musculoskeletal system: Secondary | ICD-10-CM

## 2023-06-01 DIAGNOSIS — M5030 Other cervical disc degeneration, unspecified cervical region: Secondary | ICD-10-CM

## 2023-06-01 NOTE — Telephone Encounter (Signed)
PA submitted ozempic  Key: [id number]

## 2023-06-02 ENCOUNTER — Other Ambulatory Visit (INDEPENDENT_AMBULATORY_CARE_PROVIDER_SITE_OTHER): Payer: Self-pay | Admitting: Family Medicine

## 2023-06-02 ENCOUNTER — Encounter (INDEPENDENT_AMBULATORY_CARE_PROVIDER_SITE_OTHER): Payer: Self-pay | Admitting: Family Medicine

## 2023-06-03 ENCOUNTER — Ambulatory Visit (INDEPENDENT_AMBULATORY_CARE_PROVIDER_SITE_OTHER): Payer: Self-pay | Admitting: Family Medicine

## 2023-06-03 NOTE — Telephone Encounter (Signed)
Last Visit:03/29/23     Upcoming appointments: 09/01/23          Ula Lingo  06/03/2023, 07:08

## 2023-06-03 NOTE — Telephone Encounter (Signed)
Regarding: Refill Request  ----- Message from Adelene Amas sent at 06/03/2023  3:31 PM EST -----  Copied From CRM [id number].[name], Steve TROY called to request a prescription refill. Patient calling in requesting refill of the below medications. Patient states they have called in multiple times and not heard back at all and the medication has not been sent to the pharmacy. Please advise. Thank you.       Disp Refills Start End   aspirin (ECOTRIN) 81 mg Oral Tablet, Delayed Release (E.C.) (Discontinued) 90 Tablet 3 08/25/2021 01/10/2023   Sig: take 1 tablet by mouth once daily   Sent to pharmacy as: aspirin 81 mg tablet,delayed release (ECOTRIN)   Class: E-Rx   Reason for Discontinue: Medication Reconciliation   Non-formulary Exception Code: RXHUB/No Formulary Info Available   E-Prescribing Status: Receipt confirmed by pharmacy (08/25/2021  6:55 PM EDT)          Disp Refills Start End   semaglutide (OZEMPIC) 0.25 mg or 0.5 mg (2 mg/3 mL) Subcutaneous Pen Injector 9 mL 1 03/02/2023 -   Sig: inject 0.5 milligram UNDER THE SKIN EVERY 7 DAYS FOR 28 DAYS   Sent to pharmacy as: Ozempic 0.25 mg or 0.5 mg (2 mg/3 mL) subcutaneous pen injector (semaglutide)   Non-formulary Exception Code: RXHUB/No Formulary Info Available       Preferred Pharmacy     [facility] [id number] [place] - [address]    [address] [place] [address]    Phone: [phone] Fax: [phone]    Hours: Not open 24 hours

## 2023-06-03 NOTE — Telephone Encounter (Signed)
Received correspondence from [facility] re: ozempic [place]. Pt has no active coverage. See media.    Called pt left vm to call office.

## 2023-06-03 NOTE — Telephone Encounter (Signed)
 RTC to pt. No answer. Left  vm to call office.

## 2023-06-06 ENCOUNTER — Other Ambulatory Visit (INDEPENDENT_AMBULATORY_CARE_PROVIDER_SITE_OTHER): Payer: Self-pay | Admitting: Family Medicine

## 2023-06-06 ENCOUNTER — Other Ambulatory Visit: Payer: Self-pay

## 2023-06-06 DIAGNOSIS — K760 Fatty (change of) liver, not elsewhere classified: Secondary | ICD-10-CM

## 2023-06-06 DIAGNOSIS — E1169 Type 2 diabetes mellitus with other specified complication: Secondary | ICD-10-CM

## 2023-06-06 NOTE — Telephone Encounter (Signed)
Patient called in with his united health care numbers. Said that his medicine would not be sent over until those numbers were in chart??     OZEMPIC  81 MG ASPIRIN    If possible [facility] [address]

## 2023-06-06 NOTE — Telephone Encounter (Signed)
Pt also asking for refill of 81 mg of aspirin. Not on current med list.       Last Visit:03/29/2023     Upcoming appointments: 09/01/2023           Darleen Moffitt, MA  06/06/2023, 14:08

## 2023-06-06 NOTE — Telephone Encounter (Signed)
Last Visit:11.12.2024  ROV 4.17.2025    Ecotrin is not on current med list   Thank you [employer] Hunchuck[place], MA  06/06/2023, 17:03

## 2023-06-07 ENCOUNTER — Ambulatory Visit (INDEPENDENT_AMBULATORY_CARE_PROVIDER_SITE_OTHER): Payer: Self-pay | Admitting: Family Medicine

## 2023-06-07 MED ORDER — OZEMPIC 0.25 MG OR 0.5 MG (2 MG/3 ML) SUBCUTANEOUS PEN INJECTOR
0.5000 mg | PEN_INJECTOR | SUBCUTANEOUS | 1 refills | Status: DC
Start: 2023-06-07 — End: 2023-08-15

## 2023-06-07 NOTE — Telephone Encounter (Signed)
PA submitted for ozempic to pt's new insurance [employer]  Key: [id number]

## 2023-06-08 ENCOUNTER — Ambulatory Visit (INDEPENDENT_AMBULATORY_CARE_PROVIDER_SITE_OTHER): Payer: Self-pay | Admitting: Family Medicine

## 2023-06-08 NOTE — Telephone Encounter (Signed)
Regarding: Refill Request  ----- Message from [facility] H sent at 06/08/2023  3:49 PM EST -----  Copied From CRM #[id number].[name], Steve Young called to request a prescription refill.   aspirin (ECOTRIN) 81 mg Oral Tablet, Delayed Release     [id number] [place] - [address]    [address] [place] [address]    Phone: [phone] Fax: [phone]    Hours: Not open 24 hours

## 2023-06-08 NOTE — Telephone Encounter (Signed)
Pt asking for 81 mg of aspirin. Medication on discontinue list.     Last Visit:03/29/2023     Upcoming appointments: 09/01/2023           Anndee Connett, MA  06/08/2023, 15:57

## 2023-06-10 MED ORDER — ASPIRIN 81 MG TABLET,DELAYED RELEASE
81.0000 mg | DELAYED_RELEASE_TABLET | Freq: Every day | ORAL | 0 refills | Status: DC
Start: 2023-06-10 — End: 2023-08-17

## 2023-06-10 NOTE — Telephone Encounter (Signed)
[facility]  Phone: [phone]    Fax: [phone]   Patient didn't answer the phone call. Unable to leave voice message at this time.  [facility], MA  06/10/2023, 11:45

## 2023-06-10 NOTE — Telephone Encounter (Signed)
Scripts sent as requested however also available OTC.

## 2023-06-10 NOTE — Telephone Encounter (Signed)
Pt aware and understood.

## 2023-06-10 NOTE — Telephone Encounter (Signed)
Left vm on patient emergency contact

## 2023-06-10 NOTE — Telephone Encounter (Signed)
[facility]  Phone: [phone]    Fax: [phone]   The patient didn't answer the phone call regarding the information listed below/above. I left a   voicemail on the number listed on the chart, instructing the patient to contact the office   about the message below/above  Ula Lingo  06/10/2023, 09:04

## 2023-06-19 ENCOUNTER — Other Ambulatory Visit (INDEPENDENT_AMBULATORY_CARE_PROVIDER_SITE_OTHER): Payer: Self-pay | Admitting: Family Medicine

## 2023-06-19 DIAGNOSIS — K2960 Other gastritis without bleeding: Secondary | ICD-10-CM

## 2023-06-20 ENCOUNTER — Other Ambulatory Visit (INDEPENDENT_AMBULATORY_CARE_PROVIDER_SITE_OTHER): Payer: Self-pay | Admitting: Family

## 2023-06-20 DIAGNOSIS — J4540 Moderate persistent asthma, uncomplicated: Secondary | ICD-10-CM

## 2023-06-20 NOTE — Telephone Encounter (Signed)
Last Visit:03/29/2023     Upcoming appointments: 09/01/2023           Nimah Uphoff, MA  06/20/2023, 08:27

## 2023-06-20 NOTE — Telephone Encounter (Signed)
Last Visit:03/29/2023     Upcoming appointments: 09/01/2023           Nikcole Eischeid [place], MA  06/20/2023, 16:36

## 2023-06-27 ENCOUNTER — Ambulatory Visit (INDEPENDENT_AMBULATORY_CARE_PROVIDER_SITE_OTHER): Payer: Self-pay | Admitting: Family Medicine

## 2023-06-27 DIAGNOSIS — J4540 Moderate persistent asthma, uncomplicated: Secondary | ICD-10-CM

## 2023-06-27 NOTE — Telephone Encounter (Signed)
Regarding: General Other  ----- Message from Ramona P sent at 06/27/2023  1:57 PM EST -----  Copied From CRM [id number].Jason with [employer] called with a general question or another reason.     Needing new order to state either Nebulizer with supplies  or nebulizer and supplies. DX and all signatures  Fax # [phone]

## 2023-06-27 NOTE — Telephone Encounter (Signed)
New DME order placed as requested

## 2023-06-28 NOTE — Telephone Encounter (Signed)
Faxed DME order as directed [phone] 06/28/2023   Steve Young

## 2023-06-30 ENCOUNTER — Ambulatory Visit (INDEPENDENT_AMBULATORY_CARE_PROVIDER_SITE_OTHER): Payer: Self-pay | Admitting: Family Medicine

## 2023-06-30 MED ORDER — OMEPRAZOLE 40 MG CAPSULE,DELAYED RELEASE
40.0000 mg | DELAYED_RELEASE_CAPSULE | Freq: Every day | ORAL | 0 refills | Status: DC
Start: 2023-06-30 — End: 2023-08-19

## 2023-06-30 NOTE — Telephone Encounter (Signed)
Regarding: Refill Request  ----- Message from Toni-Amy M sent at 06/30/2023  2:09 PM EST -----  Copied From CRM [id number].Yebra, Rease TROY called to request a prescription refill.     Current Outpatient Medications:  omeprazole (PRILOSEC) 40 mg Oral Capsule, Delayed Release(E.C.) 90 Capsule 0 06/21/2023 -   Sig - Route: take 1 capsule by mouth once daily - Oral   Sent to pharmacy as: omeprazole 40 mg capsule,delayed release (PRILOSEC)   Class: E-Rx   Non-formulary Exception Code: RXHUB/No Formulary Info Available   E-Prescribing Status: Receipt confirmed by pharmacy (06/21/2023 12:37 PM EST)   Renewals                Preferred Pharmacy        Hours: Not open 24 hours    [id number] [place] - [address]    [address] [place] [address]    Phone: [phone] Fax: [phone]    Hours: Not open 24 hours

## 2023-06-30 NOTE — Addendum Note (Signed)
Addended by: Alain Marion on: 06/30/2023 03:22 PM     Modules accepted: Orders

## 2023-06-30 NOTE — Telephone Encounter (Signed)
Summary: Refill Request    Copied From CRM [id number] [id number].  Marinus Maw called to request a prescription refill. Pt states  the pharmacy  never received the medication below    omeprazole (PRILOSEC) 40 mg Oral Capsule, Delayed Release(E.C.) 90 Capsule 0 06/21/2023 -  Sig - Route: take 1 capsule by mouth once daily - Oral  Sent to pharmacy as: omeprazole 40 mg capsule,delayed release (PRILOSEC)  Class: E-Rx  Non-formulary Exception Code: RXHUB/No Formulary Info Available      Preferred Pharmacy      [facility] [id number] [place], [place] - [address]   [address] [place] [address]   Phone: [phone] Fax: [phone]   Hours: Not open 24 hours

## 2023-06-30 NOTE — Telephone Encounter (Signed)
Advised patient Refill previously sent

## 2023-07-03 ENCOUNTER — Other Ambulatory Visit (INDEPENDENT_AMBULATORY_CARE_PROVIDER_SITE_OTHER): Payer: Self-pay | Admitting: Family Medicine

## 2023-07-03 DIAGNOSIS — K760 Fatty (change of) liver, not elsewhere classified: Secondary | ICD-10-CM

## 2023-07-03 DIAGNOSIS — E1169 Type 2 diabetes mellitus with other specified complication: Secondary | ICD-10-CM

## 2023-07-04 NOTE — Telephone Encounter (Signed)
 Last Visit:03/29/2023     Upcoming appointments: 09/01/2023           Nona Dell, MA  07/04/2023, 08:01

## 2023-07-09 ENCOUNTER — Other Ambulatory Visit (INDEPENDENT_AMBULATORY_CARE_PROVIDER_SITE_OTHER): Payer: Self-pay | Admitting: Family Medicine

## 2023-07-11 NOTE — Telephone Encounter (Signed)
 Last Visit:03/29/2023     Upcoming appointments: 09/01/2023           Steve Canton, MA  07/11/2023, 08:12

## 2023-07-15 ENCOUNTER — Encounter (INDEPENDENT_AMBULATORY_CARE_PROVIDER_SITE_OTHER): Payer: Self-pay | Admitting: Family Medicine

## 2023-07-19 ENCOUNTER — Telehealth (INDEPENDENT_AMBULATORY_CARE_PROVIDER_SITE_OTHER): Payer: Self-pay | Admitting: Family Medicine

## 2023-07-19 NOTE — Telephone Encounter (Signed)
 May further discuss at our upcoming appointment with Dr. Emogene Morgan as Symbicort and Elwin Sleight are both on current medication list.

## 2023-07-19 NOTE — Telephone Encounter (Signed)
 Received fax from [employer] stating patient Budes/formot AER 160-4.5 is not on formulary. Alternatives are Symbicort, Breo, Fluticasone/ Salmeterol (Advair), Dulera    Form in media

## 2023-07-20 ENCOUNTER — Other Ambulatory Visit (INDEPENDENT_AMBULATORY_CARE_PROVIDER_SITE_OTHER): Payer: Self-pay | Admitting: Physician Assistant

## 2023-07-20 DIAGNOSIS — J4540 Moderate persistent asthma, uncomplicated: Secondary | ICD-10-CM

## 2023-07-21 NOTE — Telephone Encounter (Signed)
 Last Visit:03/29/2023     Upcoming appointments: 09/01/2023           Damian Leavell, MA  07/21/2023, 07:44

## 2023-07-25 ENCOUNTER — Other Ambulatory Visit (INDEPENDENT_AMBULATORY_CARE_PROVIDER_SITE_OTHER): Payer: Self-pay | Admitting: Family Medicine

## 2023-07-25 DIAGNOSIS — M5030 Other cervical disc degeneration, unspecified cervical region: Secondary | ICD-10-CM

## 2023-07-25 DIAGNOSIS — M170 Bilateral primary osteoarthritis of knee: Secondary | ICD-10-CM

## 2023-07-25 NOTE — Telephone Encounter (Signed)
 Last Visit:03/29/2023     Upcoming appointments: 09/01/2023           Chole Driver, MA  07/25/2023, 16:08

## 2023-08-01 ENCOUNTER — Other Ambulatory Visit (INDEPENDENT_AMBULATORY_CARE_PROVIDER_SITE_OTHER): Payer: Self-pay | Admitting: Family Medicine

## 2023-08-01 ENCOUNTER — Other Ambulatory Visit (INDEPENDENT_AMBULATORY_CARE_PROVIDER_SITE_OTHER): Payer: Self-pay | Admitting: Physician Assistant

## 2023-08-02 NOTE — Telephone Encounter (Signed)
 Last Visit:9.23.24    Upcoming appointments: 09/01/2023           Damian Leavell, MA  08/02/2023, 07:51

## 2023-08-04 ENCOUNTER — Ambulatory Visit (INDEPENDENT_AMBULATORY_CARE_PROVIDER_SITE_OTHER): Payer: Self-pay | Admitting: Family Medicine

## 2023-08-04 NOTE — Telephone Encounter (Signed)
 Regarding: Clinical Question// Sooner Fit-in Appt Needed- Mouth/Tooth Infection  ----- Message from Autumn G sent at 08/04/2023 12:07 PM EDT -----  Copied From CRM #[id number].Abid, Jettie Troy called with a clinical question.     Pt has "Possible mouth/tooth infection // Cannot get into dentist until May // Swelling // Painful // Onset 3/19// Wait list" Scheduled first avail appt 4/2 but needs something sooner as the swelling (one side of mouth) is bad and has gotten worse over night. Please call Pt back for a fit-in appt asap.

## 2023-08-15 ENCOUNTER — Other Ambulatory Visit (INDEPENDENT_AMBULATORY_CARE_PROVIDER_SITE_OTHER): Payer: Self-pay | Admitting: Family Medicine

## 2023-08-15 NOTE — Telephone Encounter (Signed)
 Last Visit:03/29/2023     Upcoming appointments: 08/17/2023           Steve Sarnowski, MA  08/15/2023, 16:20

## 2023-08-17 ENCOUNTER — Encounter (INDEPENDENT_AMBULATORY_CARE_PROVIDER_SITE_OTHER): Payer: Self-pay | Admitting: Family Medicine

## 2023-08-17 ENCOUNTER — Ambulatory Visit (INDEPENDENT_AMBULATORY_CARE_PROVIDER_SITE_OTHER): Payer: Self-pay | Admitting: Family Medicine

## 2023-08-17 ENCOUNTER — Other Ambulatory Visit: Payer: Self-pay

## 2023-08-17 VITALS — BP 104/64 | HR 62 | Temp 98.0°F | Resp 18 | Ht 69.0 in | Wt 206.8 lb

## 2023-08-17 MED ORDER — PENICILLIN V POTASSIUM 500 MG TABLET
500.0000 mg | ORAL_TABLET | Freq: Four times a day (QID) | ORAL | 0 refills | Status: AC
Start: 2023-08-17 — End: 2023-08-24

## 2023-08-17 MED ORDER — SEMAGLUTIDE 1 MG/DOSE (4 MG/3 ML) SUBCUTANEOUS PEN INJECTOR
1.0000 mg | PEN_INJECTOR | SUBCUTANEOUS | 3 refills | Status: DC
Start: 2023-08-17 — End: 2023-10-24

## 2023-08-17 MED ORDER — ASPIRIN 81 MG TABLET,DELAYED RELEASE
81.0000 mg | DELAYED_RELEASE_TABLET | Freq: Every day | ORAL | 3 refills | Status: DC
Start: 2023-08-17 — End: 2023-10-27

## 2023-08-17 MED ORDER — LEVOMEFOLATE CA 3 MG-B6 35 MG-MEB12 2 MG-ALGAL OIL 90.314 MG CAPSULE
1.0000 | ORAL_CAPSULE | Freq: Two times a day (BID) | ORAL | 3 refills | Status: DC
Start: 2023-08-17 — End: 2023-08-17

## 2023-08-17 MED ORDER — LEVOMEFOLATE CA 3 MG-B6 35 MG-MEB12 2 MG-ALGAL OIL 90.314 MG CAPSULE
1.0000 | ORAL_CAPSULE | Freq: Two times a day (BID) | ORAL | 3 refills | Status: DC
Start: 2023-08-17 — End: 2023-10-27

## 2023-08-17 NOTE — Nursing Note (Signed)
 Pt is here today for follow up. Pt has abscess tooth asking for antibiotic. Never had a diabetic eye exam and last colonoscopy was done about 10 years ago.

## 2023-08-17 NOTE — Telephone Encounter (Signed)
 Regarding: Clinical Question  ----- Message from Creed Dodrill sent at 08/17/2023  3:21 PM EDT -----  Copied From CRM [id number].Hockenberry, Oluwatimilehin Troy () called to let TALAMAN-PEREZ, RACHEL know that the levomefol-B6-meB12-algal oil (METANX) 3 mg-35 mg-2 mg -90.314 mg Oral Capsule is not covered by his insurance

## 2023-08-17 NOTE — Nursing Note (Signed)
 08/17/23 0848   Depression Screen   Little interest or pleasure in doing things. 0   Feeling down, depressed, or hopeless 0   PHQ 2 Total 0

## 2023-08-17 NOTE — Progress Notes (Signed)
 Chief Complaint   Patient presents with    Follow Up     Diabetes, fatty liver, hypertension, hypokalemia    Dental Problem     Abscess     Last office visit with the provider:  02/07/2023    SUBJECTIVE:  Steve Young is a 63 y.o. male,White. Patient has lost weight intentionally on Ozempic .  His weight last time was 211 lb.  He states he is not able to see his dentist until next month.  He is due for diabetic eye exam and colonoscopy. He does not have the following symptoms today: fever, chills, cough, nasal congestion, sore throat, abdominal pain, headache, dizziness, eye pain or irritation, epistaxis, chest discomfort, stroke symptoms, shortness of breath, dyspnea on exertion or palpitations. Side effects of medications: no current medication side effects.     Current Outpatient Medications   Medication Sig    albuterol  sulfate (PROVENTIL  OR VENTOLIN  OR PROAIR ) 90 mcg/actuation Inhalation oral inhaler inhale 2 puffs by mouth and INTO THE LUNGS every 4 hours if needed for cough or wheezing    albuterol  sulfate (PROVENTIL ) 2.5 mg /3 mL (0.083 %) Inhalation nebulizer solution inhale contents of 1 vial ( ) in nebulizer every 4 hours if needed for wheezing    aspirin  (ECOTRIN) 81 mg Oral Tablet, Delayed Release (E.C.) Take 1 Tablet (81 mg total) by mouth Once a day    gabapentin  (NEURONTIN ) 600 mg Oral Tablet take 1 tablet by mouth three times a day    Ibuprofen  (MOTRIN ) 800 mg Oral Tablet take 1 tablet by mouth three times a day AS NEEDED FOR PAIN    metFORMIN  (GLUCOPHAGE ) 500 mg Oral Tablet Take 1 Tablet (500 mg total) by mouth Every morning before breakfast Indications: prevention of type 2 diabetes mellitus    omeprazole  (PRILOSEC) 40 mg Oral Capsule, Delayed Release(E.C.) Take 1 Capsule (40 mg total) by mouth Once a day    oxyCODONE -acetaminophen  (PERCOCET) 10-325 mg Oral Tablet take 1 tablet by mouth three times a day if needed for pain    OZEMPIC  0.25 mg or 0.5 mg (2 mg/3 mL) Subcutaneous Pen Injector  inject 0.5 milligram UNDER THE SKIN EVERY 7 DAYS FOR 28 DAYS    potassium chloride  (MICRO K) 10 mEq Oral Capsule, Sustained Release Take 1 Capsule (10 mEq total) by mouth Twice daily Indications: low amount of potassium in the blood, prevention of low potassium in the blood    SYMBICORT  160-4.5 mcg/actuation Inhalation oral inhaler inhale 2 puffs by mouth twice a day      No Known Allergies    Past Surgical History:   Procedure Laterality Date    COLONOSCOPY      ELBOW SURGERY Left 12/2017    HX OTHER  2004     SPINAL FUSION,ANT,EA ADNL LEVEL c3-c7    HX OTHER Right     right great toe    HX SHOULDER SURGERY Bilateral     NOSE SURGERY       Social History     Tobacco Use    Smoking status: Never    Smokeless tobacco: Never   Vaping Use    Vaping status: Never Used   Substance Use Topics    Alcohol use: Not Currently    Drug use: Never     OBJECTIVE:   The patient appears to be in no acute distress.  Vitals: BP 104/64 (Site: Left Arm, Patient Position: Sitting, Cuff Size: Adult Large)   Pulse 62   Temp 36.7 C (  98 F) (Temporal)   Resp 18   Ht 1.753 m (5\' 9" )   Wt 93.8 kg (206 lb 12.8 oz)   SpO2 97%   BMI 30.54 kg/m   General: alert, no acute distress  Head: normocephalic, atraumatic  Eye: EOM intact, normal conjunctiva  Mouth:  Moist oral mucosa with poor dentition  Respiratory: Good diaphragmatic excursion. Lungs clear.  Heart: RRR  Extremities: Ambulatory, no edema  Neurologic: Awake, alert, oriented, speech clear and coherent  Skin: No jaundice, warm   Psychiatric: Cooperative, normal affect     ASSESSMENT AND PLAN:     ICD-10-CM    1. Type 2 diabetes mellitus with obesity (CMS HCC)  (CMS HCC)  E11.69 semaglutide  (OZEMPIC ) 1 mg/dose (4 mg/3 mL) Subcutaneous Pen Injector - dose increased    E66.9 HGA1C (HEMOGLOBIN A1C WITH EST AVG GLUCOSE)     POTASSIUM     MAGNESIUM     Referral to External Provider (AMB) - ophthalmology     CBC  His weight is improving but still not at goal.  Will increase dose of weekly  Ozempic . Continue metformin , healthy diabetic diet, aspirin , regular exercise as tolerated and gradual weight loss.      2. Dental infection  K04.7 penicillin  V potassium (VEETID) 500 mg Oral Tablet - new  Advised to keep appointment with dentist.  He was given antibiotic for a week.      3. Metabolic dysfunction-associated steatotic liver disease (MASLD)  K76.0 CBC  Stable. Continue gradual weight loss and may remain on aspirin .      4. Hypokalemia  E87.6 POTASSIUM     MAGNESIUM  Continue potassium supplement.      5. Essential hypertension  I10 CBC  Improved. Continue potassium supplement. His blood pressure is at goal.      6. Colon cancer screening  Z12.11 [facility] ENDOSCOPY REQUEST (AMB) - colonoscopy      7. Screening PSA (prostate specific antigen)  Z12.5 PSA SCREENING      8. Type 2 diabetes mellitus with peripheral neuropathy (CMS HCC)  E11.42 semaglutide  (OZEMPIC ) 1 mg/dose (4 mg/3 mL) Subcutaneous Pen Injector     MAGNESIUM     levomefol-B6-meB12-algal oil (METANX) 3 mg-35 mg-2 mg -90.314 mg Oral Capsule - new     aspirin  (ECOTRIN) 81 mg Oral Tablet, Delayed Release (E.C.)  Continue gabapentin .  Added METANX twice a day and check feet regularly.        Depression screening is negative. PHQ 2 Total: 0       Importance of medication adherence discussed.  Advised patient to fill medications regularly.  Current diabetes medications:   Active Diabetes Meds   Medication Sig    metFORMIN  (GLUCOPHAGE ) 500 mg Oral Tablet Take 1 Tablet (500 mg total) by mouth Every morning before breakfast Indications: prevention of type 2 diabetes mellitus    semaglutide  (OZEMPIC ) 1 mg/dose (4 mg/3 mL) Subcutaneous Pen Injector Inject 1 mg under the skin Every 7 days Indications: type 2 diabetes mellitus, weight loss management for a person with obesity       Data reviewed:   PROSTATE SPECIFIC ANTIGEN   Lab Results   Component Value Date/Time    PROSSPECAG 1.87 05/14/2022 06:23 AM        Results in Last 18 Months   Lab Test  03/29/23  1048   MICALBRNUR 0.9     Lab Results   Component Value Date/Time    HA1C 5.7 10/21/2022 01:30 PM    BUN 13  03/29/2023 10:48 AM    CREATININE 0.93 03/29/2023 10:48 AM    GFR >90 03/29/2023 10:48 AM    GLUCOSE 115 (H) 03/29/2023 10:48 AM    CALCIUM 9.8 03/29/2023 10:48 AM    SODIUM 142 03/29/2023 10:48 AM    POTASSIUM 3.2 (L) 03/29/2023 10:48 AM    WBC 7.7 03/29/2023 10:48 AM    HGB 13.8 03/29/2023 10:48 AM    HCT 40.3 03/29/2023 10:48 AM    PLTCNT 269 03/29/2023 10:48 AM    AST 26 03/29/2023 10:48 AM    ALT 14 03/29/2023 10:48 AM    ALKPHOS 76 03/29/2023 10:48 AM    CHOLESTEROL 107 03/29/2023 10:48 AM    HDLCHOL 29 (L) 03/29/2023 10:48 AM    LDLCHOL 57 03/29/2023 10:48 AM    TRIG 112 03/29/2023 10:48 AM    TSH 0.979 03/29/2023 10:48 AM    MAGNESIUM 2.0 01/12/2023 01:09 AM     Recent Results (from the past [id number] hours)   US  RT Upper Quadrant    Collection Time: 12/09/22  4:36 PM    Narrative    INDICATION:  Metabolic dysfunction-associated steatotic liver disease    TECHNIQUE:  Ultrasound of the Right Upper Quadrant.    COMPARISON:  CT chest and A&P 05/30/2022, US  RUQ 04/04/2021, abdomen 10/03/2018    FINDINGS:      The pancreas is not well seen due to overlying bowel gas.      The liver demonstrates increased echogenicity.  There is a focal area of decreased echogenicity within the gallbladder fossa consistent with an area of focal fatty sparing.  There is no intrahepatic biliary dilatation.      The gallbladder is anechoic.  There are no stones.  There is no pericholecystic fluid or wall thickening.  The common bile duct measures 0.8 cm.  The sonographic Murphy's sign is negative.    The right kidney measures 11.4 cm in length.  Renal cortical echotexture is normal.  There is no hydronephrosis.  There are no stones.  There are no cysts.       Impression    Diffuse hepatic steatosis.    No cholelithiasis, gallbladder wall thickening, or biliary dilatation is appreciated.     No significant change from  prior ultrasound dated 04/04/2021.       Orders Placed This Encounter    HGA1C (HEMOGLOBIN A1C WITH EST AVG GLUCOSE)    POTASSIUM    MAGNESIUM    PSA SCREENING    CBC    Referral to External Provider (AMB) - ophthalmology    [facility] ENDOSCOPY REQUEST (AMB) - colonoscopy    penicillin  V potassium (VEETID) 500 mg Oral Tablet    semaglutide  (OZEMPIC ) 1 mg/dose (4 mg/3 mL) Subcutaneous Pen Injector    levomefol-B6-meB12-algal oil (METANX) 3 mg-35 mg-2 mg -90.314 mg Oral Capsule    aspirin  (ECOTRIN) 81 mg Oral Tablet, Delayed Release (E.C.)     Recheck blood work next month. Above plan was discussed with the patient and patient verbalized understanding. Patient agreed with above treatment and plan. Questions answered to patient's satisfaction. Risks, benefits, and alternatives to above treatment discussed with patient.  Follow-up in 3 months , sooner should new symptoms or problems arise.   He  was advised to contact the office if with any questions or concerns.    Steve Young. Alona Jamaica, MD  Note: This chart was transcribed using voice recognition software and may contain unintended word substitution or minor typographical errors.

## 2023-08-17 NOTE — Telephone Encounter (Signed)
 I'll send to mail order if better. Thank you.

## 2023-08-18 ENCOUNTER — Other Ambulatory Visit (INDEPENDENT_AMBULATORY_CARE_PROVIDER_SITE_OTHER): Payer: Self-pay | Admitting: Physician Assistant

## 2023-08-18 DIAGNOSIS — J4540 Moderate persistent asthma, uncomplicated: Secondary | ICD-10-CM

## 2023-08-18 NOTE — Telephone Encounter (Signed)
 2nd attempt, left v/m to call back.

## 2023-08-18 NOTE — Telephone Encounter (Signed)
 Last Visit:08/17/2023     Upcoming appointments: 11/16/2023           Raidyn Wassink, MA  08/18/2023, 15:57

## 2023-08-18 NOTE — Telephone Encounter (Signed)
 L/m for pt to return call

## 2023-08-19 ENCOUNTER — Other Ambulatory Visit (INDEPENDENT_AMBULATORY_CARE_PROVIDER_SITE_OTHER): Payer: Self-pay | Admitting: Physician Assistant

## 2023-08-19 ENCOUNTER — Ambulatory Visit (INDEPENDENT_AMBULATORY_CARE_PROVIDER_SITE_OTHER): Payer: Self-pay | Admitting: Surgery

## 2023-08-19 DIAGNOSIS — K2960 Other gastritis without bleeding: Secondary | ICD-10-CM

## 2023-08-19 NOTE — Telephone Encounter (Signed)
 Last Visit:08/17/2023     Upcoming appointments: 11/16/2023           Bearl Limes, MA  08/19/2023, 11:38

## 2023-08-19 NOTE — Telephone Encounter (Addendum)
 [facility] MEDICINE SPECIALTY CARE   GENERAL SURGERY   [address]  [place] [address]  [phone]  Brain Cahill  Date of Service:  08/19/2023  MRN:  [id number]    Called patient to direct schedule for colonoscopy patient did not answer a message was left to return the call.   1ST ATTEMPT   Tilden Foil, [place]  08/19/2023, 08:58

## 2023-08-22 ENCOUNTER — Telehealth (INDEPENDENT_AMBULATORY_CARE_PROVIDER_SITE_OTHER): Payer: Self-pay | Admitting: Surgery

## 2023-08-22 MED ORDER — SUTAB 1.479-0.188-0.225 GRAM TABLET
ORAL_TABLET | ORAL | 0 refills | Status: AC
Start: 2023-08-22 — End: 2023-08-23

## 2023-08-23 ENCOUNTER — Other Ambulatory Visit (INDEPENDENT_AMBULATORY_CARE_PROVIDER_SITE_OTHER): Payer: Self-pay | Admitting: Physician Assistant

## 2023-08-23 DIAGNOSIS — M5030 Other cervical disc degeneration, unspecified cervical region: Secondary | ICD-10-CM

## 2023-08-23 DIAGNOSIS — M170 Bilateral primary osteoarthritis of knee: Secondary | ICD-10-CM

## 2023-08-23 NOTE — Telephone Encounter (Signed)
 Last Visit:08/17/2023     Upcoming appointments: 11/16/2023           Nona Dell, MA  08/23/2023, 16:19

## 2023-09-01 ENCOUNTER — Encounter (INDEPENDENT_AMBULATORY_CARE_PROVIDER_SITE_OTHER): Payer: Medicare Other | Admitting: Family Medicine

## 2023-09-13 ENCOUNTER — Encounter (HOSPITAL_COMMUNITY): Admission: RE | Payer: Self-pay

## 2023-09-13 ENCOUNTER — Ambulatory Visit: Admission: RE | Admit: 2023-09-13 | Admitting: Surgery

## 2023-09-13 SURGERY — COLONOSCOPY
Anesthesia: Monitor Anesthesia Care

## 2023-09-17 ENCOUNTER — Other Ambulatory Visit (INDEPENDENT_AMBULATORY_CARE_PROVIDER_SITE_OTHER): Payer: Self-pay | Admitting: Family Medicine

## 2023-09-17 DIAGNOSIS — M170 Bilateral primary osteoarthritis of knee: Secondary | ICD-10-CM

## 2023-09-17 DIAGNOSIS — M5030 Other cervical disc degeneration, unspecified cervical region: Secondary | ICD-10-CM

## 2023-09-19 NOTE — Telephone Encounter (Signed)
 Last Visit:08/17/2023     Upcoming appointments: 11/16/2023           Billijo Dilling, MA  09/19/2023, 08:25

## 2023-09-21 ENCOUNTER — Other Ambulatory Visit (INDEPENDENT_AMBULATORY_CARE_PROVIDER_SITE_OTHER): Payer: Self-pay | Admitting: Family Medicine

## 2023-09-21 NOTE — Telephone Encounter (Signed)
 Last Visit:08/17/2023     Upcoming appointments: 11/16/2023           Kenadee Gates, MA  09/21/2023, 14:00

## 2023-09-21 NOTE — Telephone Encounter (Signed)
 Last Visit:08/17/2023     Upcoming appointments: 11/16/2023           Daronte Shostak, MA  09/21/2023, 13:28

## 2023-09-28 ENCOUNTER — Ambulatory Visit (INDEPENDENT_AMBULATORY_CARE_PROVIDER_SITE_OTHER): Payer: Self-pay | Admitting: Surgery

## 2023-10-04 ENCOUNTER — Encounter (INDEPENDENT_AMBULATORY_CARE_PROVIDER_SITE_OTHER): Payer: Self-pay

## 2023-10-12 ENCOUNTER — Ambulatory Visit: Attending: Family Medicine

## 2023-10-12 ENCOUNTER — Other Ambulatory Visit: Payer: Self-pay

## 2023-10-12 DIAGNOSIS — E669 Obesity, unspecified: Secondary | ICD-10-CM | POA: Insufficient documentation

## 2023-10-12 DIAGNOSIS — E1169 Type 2 diabetes mellitus with other specified complication: Secondary | ICD-10-CM | POA: Insufficient documentation

## 2023-10-12 DIAGNOSIS — Z125 Encounter for screening for malignant neoplasm of prostate: Secondary | ICD-10-CM | POA: Insufficient documentation

## 2023-10-12 DIAGNOSIS — E876 Hypokalemia: Secondary | ICD-10-CM | POA: Insufficient documentation

## 2023-10-12 DIAGNOSIS — K760 Fatty (change of) liver, not elsewhere classified: Secondary | ICD-10-CM | POA: Insufficient documentation

## 2023-10-12 DIAGNOSIS — E1142 Type 2 diabetes mellitus with diabetic polyneuropathy: Secondary | ICD-10-CM | POA: Insufficient documentation

## 2023-10-12 DIAGNOSIS — E782 Mixed hyperlipidemia: Secondary | ICD-10-CM

## 2023-10-12 DIAGNOSIS — I10 Essential (primary) hypertension: Secondary | ICD-10-CM | POA: Insufficient documentation

## 2023-10-12 LAB — CBC
HCT: 42.7 % (ref 38.9–52.0)
HGB: 14.1 g/dL (ref 13.4–17.5)
MCH: 29.1 pg (ref 26.0–32.0)
MCHC: 33 g/dL (ref 31.0–35.5)
MCV: 88 fL (ref 78.0–100.0)
MPV: 11.1 fL (ref 8.7–12.5)
PLATELETS: 261 10*3/uL (ref 150–400)
RBC: 4.85 10*6/uL (ref 4.50–6.10)
RDW-CV: 13.5 % (ref 11.5–15.5)
WBC: 7 10*3/uL (ref 3.7–11.0)

## 2023-10-12 LAB — HGA1C (HEMOGLOBIN A1C WITH EST AVG GLUCOSE)
ESTIMATED AVERAGE GLUCOSE: 108 mg/dL
HEMOGLOBIN A1C: 5.4 % (ref <=5.7)

## 2023-10-12 LAB — MAGNESIUM: MAGNESIUM: 2.1 mg/dL (ref 1.8–2.6)

## 2023-10-12 LAB — POTASSIUM: POTASSIUM: 4.8 mmol/L (ref 3.5–5.1)

## 2023-10-12 LAB — PSA SCREENING: PSA: 1.75 ng/mL (ref <=4.00)

## 2023-10-13 ENCOUNTER — Ambulatory Visit: Payer: Self-pay | Attending: Cardiovascular Disease | Admitting: Cardiovascular Disease

## 2023-10-13 ENCOUNTER — Encounter (INDEPENDENT_AMBULATORY_CARE_PROVIDER_SITE_OTHER): Payer: Self-pay | Admitting: Cardiovascular Disease

## 2023-10-13 VITALS — BP 144/91 | HR 70 | Ht 66.0 in | Wt 207.0 lb

## 2023-10-13 DIAGNOSIS — E782 Mixed hyperlipidemia: Secondary | ICD-10-CM | POA: Insufficient documentation

## 2023-10-13 DIAGNOSIS — I10 Essential (primary) hypertension: Secondary | ICD-10-CM | POA: Insufficient documentation

## 2023-10-13 NOTE — Progress Notes (Signed)
 [facility]  [address]  [place] [address]  Operated by [facility]    Name: Steve Young MRN:  [id number]   Date: 10/13/2023 DOB:  30-Oct-1960 (63 y.o.)        Steve Young is a 63 y.o. male seen in the office on 10/13/2023    Chief Complaint   Patient presents with    Follow Up 6 Months     Primary hypertension           Patient Active Problem List   Diagnosis    Primary hypertension    Mixed hyperlipidemia    Primary osteoarthritis of both knees    Obesity (BMI 30.0-34.9)    Primary insomnia    Reflux gastritis    Vitamin D  deficiency    Chronic musculoskeletal pain    Metabolic dysfunction-associated steatotic liver disease (MASLD)    Drug-induced constipation    Moderate persistent asthma without complication    Psychophysiological insomnia    PTSD (post-traumatic stress disorder)    Pituitary tumor    NAFL (nonalcoholic fatty liver)    Atherosclerosis of both carotid arteries    Hypokalemia    DDD (degenerative disc disease), cervical    Moderate persistent asthma with acute exacerbation    Hypertriglyceridemia    Mood disorder (CMS HCC)    Type 2 diabetes mellitus with obesity (CMS HCC)  (CMS HCC)    Hypertension, unspecified type    Other chronic pain    History of diverticulitis    History of COVID-19    Degenerative cervical spinal stenosis    Type 2 diabetes mellitus with peripheral neuropathy (CMS HCC)       History of Present Illness:  This is a 63 year old male coming in for follow-up.  I had seen him about 6 months earlier after a hospital stay.  He does not have any known history of heart disease.  However he was complaining of palpitations, lightheadedness, chest discomfort, and dyspnea on exertion.  Although my index of suspicion was low, I did order some cardiac testing to include 7 day cardiac monitor, echocardiogram, and a treadmill exercise test.  However, the patient never got these done.  Recently his son was murdered and the patient has not  wanted to get any further testing on himself because of the stress involved with this trauma.  At the present time, the patient is feeling fairly well from a cardiac standpoint.  He denies any recent chest pain, dyspnea, palpitations, or lightheadedness.  He is reasonably active.  He lives in his own home with a relative and this patient does most of the household chores.  He travels the basement stairs regularly without any difficulty.          Current Outpatient Medications   Medication Sig    albuterol  sulfate (PROVENTIL  OR VENTOLIN  OR PROAIR ) 90 mcg/actuation Inhalation oral inhaler inhale 2 puffs by mouth and INTO THE LUNGS every 4 hours if needed for cough or wheezing    albuterol  sulfate (PROVENTIL ) 2.5 mg /3 mL (0.083 %) Inhalation nebulizer solution inhale contents of 1 vial ( ) in nebulizer every 4 hours if needed for wheezing    aspirin  (ECOTRIN) 81 mg Oral Tablet, Delayed Release (E.C.) Take 1 Tablet (81 mg total) by mouth Daily Indications: treatment to prevent a heart attack    gabapentin  (NEURONTIN ) 600 mg Oral Tablet take 1 tablet by mouth three times a day    Ibuprofen  (MOTRIN ) 800 mg Oral Tablet take  1 tablet by mouth three times a day if needed for pain    levomefol-B6-meB12-algal oil (METANX) 3 mg-35 mg-2 mg -90.314 mg Oral Capsule Take 1 Capsule by mouth Twice daily    metFORMIN  (GLUCOPHAGE ) 500 mg Oral Tablet Take 1 Tablet (500 mg total) by mouth Every morning before breakfast Indications: prevention of type 2 diabetes mellitus    omeprazole  (PRILOSEC) 40 mg Oral Capsule, Delayed Release(E.C.) take 1 capsule by mouth once daily    oxyCODONE -acetaminophen  (PERCOCET) 10-325 mg Oral Tablet take 1 tablet by mouth three times a day if needed for pain    potassium chloride  (MICRO K) 10 mEq Oral Capsule, Sustained Release Take 1 Capsule (10 mEq total) by mouth Twice daily Indications: low amount of potassium in the blood, prevention of low potassium in the blood    semaglutide  (OZEMPIC ) 1 mg/dose (4  mg/3 mL) Subcutaneous Pen Injector Inject 1 mg under the skin Every 7 days Indications: type 2 diabetes mellitus, weight loss management for a person with obesity    SYMBICORT  160-4.5 mcg/actuation Inhalation oral inhaler inhale 2 puffs by mouth twice a day       No Known Allergies  Family Medical History:       Problem Relation (Age of Onset)    Cancer Mother, Sister    Gout Father    Heart Disease Mother, Sister, Interior and spatial designer            Social History     Socioeconomic History    Marital status: Divorced    Number of children: 1   Occupational History    Occupation: retired: ship yards, drove Marine scientist    Tobacco Use    Smoking status: Never    Smokeless tobacco: Never   Vaping Use    Vaping status: Never Used   Substance and Sexual Activity    Alcohol use: Not Currently    Drug use: Never   Social History Narrative    Right handed.      Social Determinants of Health     Financial Resource Strain: Low Risk  (01/09/2023)    Financial Resource Strain     SDOH Financial: No   Transportation Needs: Low Risk  (01/09/2023)    Transportation Needs     SDOH Transportation: No   Social Connections: Low Risk  (01/09/2023)    Social Connections     SDOH Social Isolation: 5 or more times a week   Intimate Partner Violence: Low Risk  (09/06/2022)    Intimate Partner Violence     SDOH Domestic Violence: No   Housing Stability: Low Risk  (01/09/2023)    Housing Stability     SDOH Housing Situation: I have housing.     SDOH Housing Worry: No         Physical Exam:  BP (!) 144/91   Pulse 70   Ht 1.676 m (5\' 6" )   Wt 93.9 kg (207 lb)   SpO2 98%   BMI 33.41 kg/m       Body mass index is 33.41 kg/m.  Wt Readings from Last 5 Encounters:   10/13/23 93.9 kg (207 lb)   08/17/23 93.8 kg (206 lb 12.8 oz)   04/12/23 96.6 kg (213 lb)   04/04/23 92.5 kg (204 lb)   03/29/23 92.8 kg (204 lb 9.6 oz)     The patient is in no distress. Skin is warm and dry. There is no neck vein distention. No carotid bruits heard. The lungs are clear. The  heart  is regular without any significant murmur, gallop,rub,or click. The abdomen is soft and nontender. The aorta is not palpable. Extremities show no significant edema.  No clubbing or cyanosis.  Peripheral pulses intact.    PERTINENT DIAGNOSTICS:  EKG not done today.  Lipid panel March 29, 2023 showed a cholesterol 107, triglyceride 112, HDL 29, LDL 57. In 2023 the cholesterol was 161 with triglyceride of 225, HDL 29, LDL 94. TSH normal. Chem 12 showed a glucose of 115 and a creatinine of 0.93.   Blood work done yesterday showed a normal CBC.  A1c 5.4.  In November 2024 TSH normal      IMPRESSION:  Assessment/Plan   1. Primary hypertension    2. Mixed hyperlipidemia          PLAN:  At this time, the patient seems stable from a cardiac standpoint.  The previously described symptoms have resolved.  At this time he is not having any cardiac concerns and therefore no further testing will be needed at this time.  His blood pressure is a little high today but was normal just a month ago.  I told him to continue to follow his blood pressure and he could require treatment for this in the future.  He will just return to this office as needed and he will follow up with his PCP.  A copy of this note will go to his PCP.  No orders of the defined types were placed in this encounter.

## 2023-10-18 ENCOUNTER — Ambulatory Visit (INDEPENDENT_AMBULATORY_CARE_PROVIDER_SITE_OTHER): Payer: Self-pay | Admitting: Family Medicine

## 2023-10-18 DIAGNOSIS — M5030 Other cervical disc degeneration, unspecified cervical region: Secondary | ICD-10-CM

## 2023-10-18 DIAGNOSIS — M7918 Myalgia, other site: Secondary | ICD-10-CM

## 2023-10-18 DIAGNOSIS — M62838 Other muscle spasm: Secondary | ICD-10-CM

## 2023-10-18 MED ORDER — CYCLOBENZAPRINE 10 MG TABLET
10.0000 mg | ORAL_TABLET | Freq: Three times a day (TID) | ORAL | 3 refills | Status: DC | PRN
Start: 2023-10-18 — End: 2023-10-27

## 2023-10-18 NOTE — Telephone Encounter (Signed)
 [facility]  Phone: [phone]    Fax: [phone]   Patient is aware of this, and verbally understood the message.   Steve Young  10/18/2023, 12:53

## 2023-10-18 NOTE — Telephone Encounter (Signed)
 Regarding: Refill Request  ----- Message from Madelyne Schiff sent at 10/18/2023 11:26 AM EDT -----  Copied From CRM #[id number].Young, Steve Troy () called to request a prescription refill.      [facility] - [place], [place] - [address]    [address] [place] [address]    Phone: [phone] Fax: [phone]    Hours: Not open 24 hours      --flexeril 

## 2023-10-24 ENCOUNTER — Other Ambulatory Visit (INDEPENDENT_AMBULATORY_CARE_PROVIDER_SITE_OTHER): Payer: Self-pay | Admitting: Family Medicine

## 2023-10-24 DIAGNOSIS — E1142 Type 2 diabetes mellitus with diabetic polyneuropathy: Secondary | ICD-10-CM

## 2023-10-24 DIAGNOSIS — E1169 Type 2 diabetes mellitus with other specified complication: Secondary | ICD-10-CM

## 2023-10-24 MED ORDER — SEMAGLUTIDE 1 MG/DOSE (4 MG/3 ML) SUBCUTANEOUS PEN INJECTOR
1.0000 mg | PEN_INJECTOR | SUBCUTANEOUS | 3 refills | Status: DC
Start: 2023-10-24 — End: 2023-10-27

## 2023-10-24 NOTE — Telephone Encounter (Signed)
 Last Visit:08/17/2023     Upcoming appointments: 11/16/2023           Kandis Ormond, RN  10/24/2023, 16:30       Copied From CRM [id number].  Steve Young, Steve Young (Self) called to request a prescription refill.    semaglutide  (OZEMPIC ) 1 mg/dose (4 mg/3 mL) Subcutaneous Pen Injector    Preferred Pharmacy     [facility] - [place], [place] - [address]   [address] [place] [address]   Phone: [phone] Fax: [phone]   Hours: Not open 24 hours

## 2023-10-27 ENCOUNTER — Telehealth (INDEPENDENT_AMBULATORY_CARE_PROVIDER_SITE_OTHER): Payer: Self-pay | Admitting: Family Medicine

## 2023-10-27 DIAGNOSIS — E1142 Type 2 diabetes mellitus with diabetic polyneuropathy: Secondary | ICD-10-CM

## 2023-10-27 DIAGNOSIS — M5030 Other cervical disc degeneration, unspecified cervical region: Secondary | ICD-10-CM

## 2023-10-27 DIAGNOSIS — M62838 Other muscle spasm: Secondary | ICD-10-CM

## 2023-10-27 DIAGNOSIS — M170 Bilateral primary osteoarthritis of knee: Secondary | ICD-10-CM

## 2023-10-27 DIAGNOSIS — K2960 Other gastritis without bleeding: Secondary | ICD-10-CM

## 2023-10-27 DIAGNOSIS — G8929 Other chronic pain: Secondary | ICD-10-CM

## 2023-10-27 DIAGNOSIS — E1169 Type 2 diabetes mellitus with other specified complication: Secondary | ICD-10-CM

## 2023-10-27 DIAGNOSIS — E876 Hypokalemia: Secondary | ICD-10-CM

## 2023-10-27 DIAGNOSIS — R7303 Prediabetes: Secondary | ICD-10-CM

## 2023-10-27 DIAGNOSIS — J4540 Moderate persistent asthma, uncomplicated: Secondary | ICD-10-CM

## 2023-10-27 DIAGNOSIS — I10 Essential (primary) hypertension: Secondary | ICD-10-CM

## 2023-10-27 MED ORDER — LEVOMEFOLATE CA 3 MG-B6 35 MG-MEB12 2 MG-ALGAL OIL 90.314 MG CAPSULE
1.0000 | ORAL_CAPSULE | Freq: Two times a day (BID) | ORAL | 3 refills | Status: DC
Start: 2023-10-27 — End: 2024-02-16

## 2023-10-27 MED ORDER — ASPIRIN 81 MG TABLET,DELAYED RELEASE
81.0000 mg | DELAYED_RELEASE_TABLET | Freq: Every day | ORAL | 3 refills | Status: AC
Start: 2023-10-27 — End: ?

## 2023-10-27 MED ORDER — CYCLOBENZAPRINE 10 MG TABLET
10.0000 mg | ORAL_TABLET | Freq: Three times a day (TID) | ORAL | 3 refills | Status: DC | PRN
Start: 2023-10-27 — End: 2024-02-16

## 2023-10-27 MED ORDER — METFORMIN 500 MG TABLET
500.0000 mg | ORAL_TABLET | Freq: Every morning | ORAL | 3 refills | Status: DC
Start: 2023-10-27 — End: 2024-02-09

## 2023-10-27 MED ORDER — ATORVASTATIN 40 MG TABLET
40.0000 mg | ORAL_TABLET | Freq: Every day | ORAL | 3 refills | Status: AC
Start: 2023-10-27 — End: ?

## 2023-10-27 MED ORDER — OMEPRAZOLE 40 MG CAPSULE,DELAYED RELEASE
40.0000 mg | DELAYED_RELEASE_CAPSULE | Freq: Every day | ORAL | 3 refills | Status: AC
Start: 2023-10-27 — End: ?

## 2023-10-27 MED ORDER — ALBUTEROL SULFATE HFA 90 MCG/ACTUATION AEROSOL INHALER
1.0000 | INHALATION_SPRAY | RESPIRATORY_TRACT | 3 refills | Status: DC | PRN
Start: 2023-10-27 — End: 2024-01-17

## 2023-10-27 MED ORDER — SEMAGLUTIDE 1 MG/DOSE (4 MG/3 ML) SUBCUTANEOUS PEN INJECTOR
1.0000 mg | PEN_INJECTOR | SUBCUTANEOUS | 3 refills | Status: DC
Start: 2023-10-27 — End: 2023-11-12

## 2023-10-27 MED ORDER — IBUPROFEN 800 MG TABLET
800.0000 mg | ORAL_TABLET | Freq: Three times a day (TID) | ORAL | 3 refills | Status: DC | PRN
Start: 2023-10-27 — End: 2024-01-20

## 2023-10-27 MED ORDER — BUDESONIDE-FORMOTEROL HFA 160 MCG-4.5 MCG/ACTUATION AEROSOL INHALER
2.0000 | INHALATION_SPRAY | Freq: Two times a day (BID) | RESPIRATORY_TRACT | 11 refills | Status: AC
Start: 2023-10-27 — End: ?

## 2023-10-27 MED ORDER — POTASSIUM CHLORIDE ER 10 MEQ CAPSULE,EXTENDED RELEASE
10.0000 meq | ORAL_CAPSULE | Freq: Two times a day (BID) | ORAL | 5 refills | Status: DC
Start: 2023-10-27 — End: 2024-02-16

## 2023-10-27 MED ORDER — ALBUTEROL SULFATE 2.5 MG/3 ML (0.083 %) SOLUTION FOR NEBULIZATION
2.5000 mg | INHALATION_SOLUTION | Freq: Four times a day (QID) | RESPIRATORY_TRACT | 3 refills | Status: DC | PRN
Start: 2023-10-27 — End: 2024-02-20

## 2023-10-27 MED ORDER — GABAPENTIN 600 MG TABLET
600.0000 mg | ORAL_TABLET | Freq: Three times a day (TID) | ORAL | 3 refills | Status: DC
Start: 2023-10-27 — End: 2024-02-20

## 2023-10-27 NOTE — Telephone Encounter (Signed)
 Received fax from [facility] regarding refills, scanned in

## 2023-10-27 NOTE — Telephone Encounter (Signed)
 Left vm waiting on call back

## 2023-11-12 ENCOUNTER — Other Ambulatory Visit (HOSPITAL_COMMUNITY): Payer: Self-pay

## 2023-11-12 DIAGNOSIS — E669 Obesity, unspecified: Secondary | ICD-10-CM

## 2023-11-12 MED ORDER — SEMAGLUTIDE 1 MG/DOSE (4 MG/3 ML) SUBCUTANEOUS PEN INJECTOR
1.0000 mg | PEN_INJECTOR | SUBCUTANEOUS | 3 refills | Status: AC
Start: 2023-11-12 — End: ?

## 2023-11-12 NOTE — Progress Notes (Signed)
 Population Health    Reason for Encounter: Medication Adherence Quality Review    I reviewed Steve Young chart and medication dispensing report for compliance with semaglutide  (OZEMPIC ) 1 mg/dose (4 ml/3 ml) Subcutaneous Pen Injector and upon review the patient may benefit from the conversion to an extended day supply of their maintenance medications. Please review the pended prescriptions for appropriateness. Thank you for everything that you do for your patients and for your collaboration. Please feel free to reach out with any questions, concerns or assistance that I can provide.      Thanks!  Griselda Tosh, NURSE NAVIGATOR

## 2023-11-16 ENCOUNTER — Other Ambulatory Visit: Payer: Self-pay

## 2023-11-16 ENCOUNTER — Encounter (INDEPENDENT_AMBULATORY_CARE_PROVIDER_SITE_OTHER): Payer: Self-pay | Admitting: Family Medicine

## 2023-11-16 ENCOUNTER — Ambulatory Visit (INDEPENDENT_AMBULATORY_CARE_PROVIDER_SITE_OTHER): Payer: Self-pay | Admitting: Family Medicine

## 2023-11-16 VITALS — BP 126/78 | HR 76 | Temp 97.8°F | Resp 18 | Ht 66.0 in | Wt 204.2 lb

## 2023-11-16 DIAGNOSIS — Z7985 Long-term (current) use of injectable non-insulin antidiabetic drugs: Secondary | ICD-10-CM

## 2023-11-16 DIAGNOSIS — M79641 Pain in right hand: Secondary | ICD-10-CM

## 2023-11-16 DIAGNOSIS — M4802 Spinal stenosis, cervical region: Secondary | ICD-10-CM

## 2023-11-16 DIAGNOSIS — M549 Dorsalgia, unspecified: Secondary | ICD-10-CM

## 2023-11-16 DIAGNOSIS — Z1211 Encounter for screening for malignant neoplasm of colon: Secondary | ICD-10-CM

## 2023-11-16 DIAGNOSIS — E1169 Type 2 diabetes mellitus with other specified complication: Secondary | ICD-10-CM

## 2023-11-16 DIAGNOSIS — I10 Essential (primary) hypertension: Secondary | ICD-10-CM

## 2023-11-16 DIAGNOSIS — Z7984 Long term (current) use of oral hypoglycemic drugs: Secondary | ICD-10-CM

## 2023-11-16 DIAGNOSIS — E669 Obesity, unspecified: Secondary | ICD-10-CM

## 2023-11-16 DIAGNOSIS — M160 Bilateral primary osteoarthritis of hip: Secondary | ICD-10-CM | POA: Insufficient documentation

## 2023-11-16 NOTE — Nursing Note (Signed)
 Pt is here today for 3 month follow up. B/l hip and back pain. Hasn't had diabetic eye exam yet. Last colonoscopy was done in 2011.

## 2023-11-16 NOTE — Progress Notes (Signed)
 Chief Complaint   Patient presents with    Follow Up     Diabetes, hypertension, DDD     Pain     Last office visit with the provider:08/17/2023     SUBJECTIVE:  Steve Young is a 63 y.o. male,White. Patient has lost 3 lb since last visit.  He has known history of having arthritis.  He states he has pain in the right palm without injury and feels a lump. He does not have the following symptoms today: fever, chills, cough, nasal congestion, sore throat, abdominal pain, headache, dizziness, eye pain or irritation, epistaxis, chest discomfort, stroke symptoms, shortness of breath or palpitations. Side effects of medications: no current medication side effects.     Current Outpatient Medications   Medication Sig    albuterol  sulfate (PROVENTIL  OR VENTOLIN  OR PROAIR ) 90 mcg/actuation Inhalation oral inhaler Take 1-2 Puffs by inhalation Every 4 hours as needed    albuterol  sulfate (PROVENTIL ) 2.5 mg /3 mL (0.083 %) Inhalation nebulizer solution Take 3 mL (2.5 mg total) by nebulization Four times a day as needed for Wheezing Indications: asthma attack    aspirin  (ECOTRIN) 81 mg Oral Tablet, Delayed Release (E.C.) Take 1 Tablet (81 mg total) by mouth Daily Indications: treatment to prevent a heart attack    atorvastatin  (LIPITOR) 40 mg Oral Tablet Take 1 Tablet (40 mg total) by mouth Daily Indications: high cholesterol and high triglycerides, treatment to prevent a heart attack    budesonide -formoteroL  (SYMBICORT ) 160-4.5 mcg/actuation Inhalation oral inhaler Take 2 Puffs by inhalation Twice daily Indications: controller medication for asthma    cyclobenzaprine  (FLEXERIL ) 10 mg Oral Tablet Take 1 Tablet (10 mg total) by mouth Every 8 hours as needed for Muscle spasms Indications: disorder characterized by stiff, tender & painful muscles, muscle spasm    gabapentin  (NEURONTIN ) 600 mg Oral Tablet Take 1 Tablet (600 mg total) by mouth Three times a day    Ibuprofen  (MOTRIN ) 800 mg Oral Tablet Take 1 Tablet (800 mg total)  by mouth Every 8 hours as needed for Pain Indications: joint damage causing pain and loss of function, pain    levomefol-B6-meB12-algal oil (METANX) 3 mg-35 mg-2 mg -90.314 mg Oral Capsule Take 1 Capsule by mouth Twice daily    metFORMIN  (GLUCOPHAGE ) 500 mg Oral Tablet Take 1 Tablet (500 mg total) by mouth Every morning before breakfast Indications: prevention of type 2 diabetes mellitus    omeprazole  (PRILOSEC) 40 mg Oral Capsule, Delayed Release(E.C.) Take 1 Capsule (40 mg total) by mouth Daily Indications: gastroesophageal reflux disease    oxyCODONE -acetaminophen  (PERCOCET) 10-325 mg Oral Tablet take 1 tablet by mouth three times a day if needed for pain    potassium chloride  (MICRO K) 10 mEq Oral Capsule, Sustained Release Take 1 Capsule (10 mEq total) by mouth Twice daily Indications: low amount of potassium in the blood, prevention of low potassium in the blood    semaglutide  (OZEMPIC ) 1 mg/dose (4 mg/3 mL) Subcutaneous Pen Injector Inject 1 mg under the skin Every 7 days Indications: type 2 diabetes mellitus, weight loss management for a person with obesity      Allergies[1]    Past Surgical History:   Procedure Laterality Date    COLONOSCOPY      ELBOW SURGERY Left 12/2017    HX OTHER  2004     SPINAL FUSION,ANT,EA ADNL LEVEL c3-c7    HX OTHER Right     right great toe    HX SHOULDER SURGERY Bilateral  NOSE SURGERY       Social History[2]    OBJECTIVE:   The patient appears to be in no acute distress.  Vitals: BP 126/78 (Site: Left Arm, Patient Position: Sitting, Cuff Size: Adult Large)   Pulse 76   Temp 36.6 C (97.8 F) (Temporal)   Resp 18   Ht 1.676 m (5' 6)   Wt 92.6 kg (204 lb 3.2 oz)   SpO2 97%   BMI 32.96 kg/m   General: alert, no acute distress  Head: normocephalic, atraumatic  Eye: EOM intact, normal conjunctiva  Respiratory: Good diaphragmatic excursion. Lungs clear.  Heart: RRR  Extremities: Ambulatory, no edema  Neurologic: Awake, alert, oriented, speech clear and coherent  Skin: No  jaundice, warm   Psychiatric: Cooperative, normal affect     ASSESSMENT AND PLAN:     ICD-10-CM    1. Type 2 diabetes mellitus with obesity (CMS HCC)  (CMS HCC)  E11.69 Referral to External Provider (AMB) - ophthalmology    E66.9 MICROALBUMIN/CREATININE RATIO, URINE, RANDOM     Comp Metabolic Panel-Fasting     LIPID PANEL     URIC ACID  His most recent A1c is normal and he is also losing weight. Continue well-balanced ADA meals, diabetes medicines, aspirin , atorvastatin , checking feet regularly, regular exercise as tolerated, weight management, control of blood pressure and cholesterol, regular eye checks.      2. Essential hypertension  I10 Comp Metabolic Panel-Fasting     URIC ACID  Improved.  Recommended to continue gradual weight loss.      3. Primary osteoarthritis of both hips  M16.0 Refer to [facility]     XR HIPS BILATERAL W PELVIS 2 VIEWS [IMG10028]     Comp Metabolic Panel-Fasting  He may continue aspirin  and ibuprofen  as needed.      4. Degenerative cervical spinal stenosis  M48.02 XR CERVICAL SPINE AP AND LATERAL [IMG890]     Refer to  [facility]     Comp Metabolic Panel-Fasting  He may continue aspirin , gabapentin  and ibuprofen  as needed.      5. Back arthralgia  M54.9 XR LUMBOSACRAL SPINE [IMG69]     Comp Metabolic Panel-Fasting     URIC ACID  Continue Flexeril  as needed for muscle spasms.      6. Colon cancer screening  Z12.11 FIT Test - External  He prefers stool test for colon cancer screening.  He states he has no problems with bowel movements.      7. Pain of right hand  M79.641 XR HAND RIGHT [IMG111]  Continue aspirin  and ibuprofen  as needed.        Importance of medication adherence discussed.  Advised patient to fill medications regularly.  Current diabetes medications:   Active Diabetes Meds   Medication Sig    metFORMIN  (GLUCOPHAGE ) 500 mg Oral Tablet Take 1 Tablet (500 mg total) by mouth Every morning before breakfast  Indications: prevention of type 2 diabetes mellitus    semaglutide  (OZEMPIC ) 1 mg/dose (4 mg/3 mL) Subcutaneous Pen Injector Inject 1 mg under the skin Every 7 days Indications: type 2 diabetes mellitus, weight loss management for a person with obesity     Data reviewed:   PROSTATE SPECIFIC ANTIGEN   Lab Results   Component Value Date/Time    PROSSPECAG 1.75 10/12/2023 01:29 PM        Results in Last 18 Months   Lab Test 03/29/23  1048   MICALBRNUR 0.9  Lab Results   Component Value Date/Time    HA1C 5.4 10/12/2023 01:29 PM    BUN 13 03/29/2023 10:48 AM    CREATININE 0.93 03/29/2023 10:48 AM    GFR >90 03/29/2023 10:48 AM    GLUCOSE 115 (H) 03/29/2023 10:48 AM    CALCIUM 9.8 03/29/2023 10:48 AM    SODIUM 142 03/29/2023 10:48 AM    POTASSIUM 4.8 10/12/2023 01:29 PM    WBC 7.0 10/12/2023 01:29 PM    HGB 14.1 10/12/2023 01:29 PM    HCT 42.7 10/12/2023 01:29 PM    PLTCNT 261 10/12/2023 01:29 PM    AST 26 03/29/2023 10:48 AM    ALT 14 03/29/2023 10:48 AM    ALKPHOS 76 03/29/2023 10:48 AM    CHOLESTEROL 107 03/29/2023 10:48 AM    HDLCHOL 29 (L) 03/29/2023 10:48 AM    LDLCHOL 57 03/29/2023 10:48 AM    TRIG 112 03/29/2023 10:48 AM    TSH 0.979 03/29/2023 10:48 AM    MAGNESIUM 2.1 10/12/2023 01:29 PM     Orders Placed This Encounter    XR HIPS BILATERAL W PELVIS 2 VIEWS [IMG10028]    XR CERVICAL SPINE AP AND LATERAL [IMG890]    XR LUMBOSACRAL SPINE [IMG69]    XR HAND RIGHT [IMG111]    FIT Test - External    MICROALBUMIN/CREATININE RATIO, URINE, RANDOM    Comp Metabolic Panel-Fasting    LIPID PANEL    URIC ACID    Refer to [facility]    Refer to  [facility]    Referral to External Provider (AMB) - ophthalmology     Recheck blood work before next office visit. Above plan was discussed with the patient and patient verbalized understanding. Patient agreed with above treatment and plan. Questions answered to patient's satisfaction.   Follow-up in 3  months, sooner should new symptoms or problems arise.   He  was advised to contact the office if with any questions or concerns.    Vernell PARAS. Lucinda, MD  Note: This chart was transcribed using voice recognition software and may contain unintended word substitution or minor typographical errors.         [1] No Known Allergies  [2]   Social History  Tobacco Use    Smoking status: Never    Smokeless tobacco: Never   Vaping Use    Vaping status: Never Used   Substance Use Topics    Alcohol use: Not Currently    Drug use: Never

## 2023-11-16 NOTE — Nursing Note (Signed)
 11/16/23 1546   Housing Stability   What is your housing situation? Has Housing   Are you worried about losing your housing? No   Employment   What is your current work situation? Other unemp.   Financial   In the past year, have you or any family members you live with been unable to get any of the following when it was really needed?  No   Transportation   Has lack of transportation kept you from medical appointments, meetings, work, or from getting things needed for daily living?  No   Social Connections   How often do you see or talk to people that you care about and feel close to? (For example: talking to friends on the phone, visiting friends or family, going to church or club meetings) >=5x a wk   Intimate Partner Violence   In the past year, have you been afraid of your partner or ex-partner? No   Do you feel physically safe and emotionally safe where you currently live? Yes   Help Needed   Would you like help with any of these needs? No   Are any of these needs urgent? No

## 2023-11-23 ENCOUNTER — Ambulatory Visit: Attending: ORTHOPAEDIC SURGERY | Admitting: ORTHOPAEDIC SURGERY

## 2023-11-23 ENCOUNTER — Other Ambulatory Visit: Payer: Self-pay

## 2023-11-23 ENCOUNTER — Encounter (HOSPITAL_COMMUNITY): Payer: Self-pay | Admitting: ORTHOPAEDIC SURGERY

## 2023-11-23 ENCOUNTER — Ambulatory Visit (HOSPITAL_COMMUNITY)
Admission: RE | Admit: 2023-11-23 | Discharge: 2023-11-23 | Disposition: A | Discharge status: Home / Self Care | Source: Ambulatory Visit

## 2023-11-23 VITALS — Ht 69.0 in | Wt 204.0 lb

## 2023-11-23 DIAGNOSIS — R791 Abnormal coagulation profile: Secondary | ICD-10-CM | POA: Insufficient documentation

## 2023-11-23 DIAGNOSIS — M4802 Spinal stenosis, cervical region: Secondary | ICD-10-CM | POA: Insufficient documentation

## 2023-11-23 DIAGNOSIS — M545 Low back pain, unspecified: Secondary | ICD-10-CM | POA: Insufficient documentation

## 2023-11-23 DIAGNOSIS — R202 Paresthesia of skin: Secondary | ICD-10-CM | POA: Insufficient documentation

## 2023-11-23 DIAGNOSIS — R200 Anesthesia of skin: Secondary | ICD-10-CM | POA: Insufficient documentation

## 2023-11-23 NOTE — Patient Instructions (Signed)
 Myelogram- Our office will authorize. you will call to schedule. [place] Scheduling [phone]  PT- You will call to schedule [phone] [facility] or anywhere close to home.  [facility]  Referrals- Dr. Bonnetta office will call you to schedule the appointment  Any Questions please call the office at [phone].

## 2023-11-23 NOTE — Progress Notes (Signed)
 [facility]  Spine Center  Clinic Note     Name: Steve Young MRN:  [id number]   Date: 11/23/2023 Age: 63 y.o.       HPI:  Steve Young is a 63 y.o. male back for follow up.  He continues to have severe right-sided neck pain and right arm radicular pain down to his fingers.  It is more the posterior and lateral aspect of his arm.  No left arm symptoms.  It is consistent with what he had when we saw him last time.  He also has been having increased low back pain with numbness and tingling down into both thighs.  This is worse with activity and he said he has been doing a lot of lawn work which is exacerbated the symptoms.  No bowel or bladder dysfunction.  Not tripping stumbling or falling.      Past Medical History:  Past Medical History:   Diagnosis Date    AKI (acute kidney injury) (CMS HCC) 01/09/2023    Arthralgia of hip 03/21/2018    Arthralgia of right upper arm 03/30/2016    Asthma     Asthma exacerbation 01/09/2023    Atherosclerosis of both carotid arteries 03/04/2020    Cervicalgia 03/21/2018    Chronic abdominal pain 10/04/2018    Chronic back pain 03/21/2018    Chronic chest pain 03/21/2018    Chronic insomnia     Constipation in male 09/13/2017    COVID 12/2022    COVID-19 04/28/2021    Degenerative cervical spinal stenosis 03/30/2023    Diverticulitis 01/09/2023    Elevated lactic acid level 01/09/2023    Esophageal reflux     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    Hepatic lesion 10/04/2018    Hyperlipidemia     Hypertension     Hypertriglyceridemia 05/15/2022    Hypokalemia 11/18/2020    Impingement syndrome of left shoulder 12/28/2017    Metabolic dysfunction-associated steatotic liver disease (MASLD) 10/04/2018    Mild anemia 02/07/2023    Mild persistent asthma without complication 03/21/2018    NAFL (nonalcoholic fatty liver)     Normal cardiac stress test 03/21/2018    Nosebleed     Obesity     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    Partial tear of common extensor tendon of  elbow 12/13/2017    Pituitary tumor     Prediabetes 02/18/2020    Rib pain 10/04/2018    Sepsis 01/09/2023    Sexual dysfunction     Tear of talofibular ligament 05/30/2013    RIGHT    Type 2 diabetes mellitus with other specified complication, unspecified whether long term insulin  use (CMS HCC) 01/11/2023    Vitamin D  deficiency            Past Surgical History:  Past Surgical History:   Procedure Laterality Date    COLONOSCOPY      ELBOW SURGERY Left 12/2017    HX OTHER  2004     SPINAL FUSION,ANT,EA ADNL LEVEL c3-c7    HX OTHER Right     right great toe    HX SHOULDER SURGERY Bilateral     NOSE SURGERY             Medications:  Current Outpatient Medications   Medication Sig    albuterol  sulfate (PROVENTIL  OR VENTOLIN  OR PROAIR ) 90 mcg/actuation Inhalation oral inhaler Take 1-2 Puffs by inhalation Every 4 hours as needed    albuterol  sulfate (PROVENTIL ) 2.5 mg /3 mL (0.083 %)  Inhalation nebulizer solution Take 3 mL (2.5 mg total) by nebulization Four times a day as needed for Wheezing Indications: asthma attack    aspirin  (ECOTRIN) 81 mg Oral Tablet, Delayed Release (E.C.) Take 1 Tablet (81 mg total) by mouth Daily Indications: treatment to prevent a heart attack    atorvastatin  (LIPITOR) 40 mg Oral Tablet Take 1 Tablet (40 mg total) by mouth Daily Indications: high cholesterol and high triglycerides, treatment to prevent a heart attack    budesonide -formoteroL  (SYMBICORT ) 160-4.5 mcg/actuation Inhalation oral inhaler Take 2 Puffs by inhalation Twice daily Indications: controller medication for asthma    cyclobenzaprine  (FLEXERIL ) 10 mg Oral Tablet Take 1 Tablet (10 mg total) by mouth Every 8 hours as needed for Muscle spasms Indications: disorder characterized by stiff, tender & painful muscles, muscle spasm    gabapentin  (NEURONTIN ) 600 mg Oral Tablet Take 1 Tablet (600 mg total) by mouth Three times a day    Ibuprofen  (MOTRIN ) 800 mg Oral Tablet Take 1 Tablet (800 mg total) by mouth Every 8 hours as needed for  Pain Indications: joint damage causing pain and loss of function, pain    levomefol-B6-meB12-algal oil (METANX) 3 mg-35 mg-2 mg -90.314 mg Oral Capsule Take 1 Capsule by mouth Twice daily    metFORMIN  (GLUCOPHAGE ) 500 mg Oral Tablet Take 1 Tablet (500 mg total) by mouth Every morning before breakfast Indications: prevention of type 2 diabetes mellitus    omeprazole  (PRILOSEC) 40 mg Oral Capsule, Delayed Release(E.C.) Take 1 Capsule (40 mg total) by mouth Daily Indications: gastroesophageal reflux disease    oxyCODONE -acetaminophen  (PERCOCET) 10-325 mg Oral Tablet take 1 tablet by mouth three times a day if needed for pain    potassium chloride  (MICRO K) 10 mEq Oral Capsule, Sustained Release Take 1 Capsule (10 mEq total) by mouth Twice daily Indications: low amount of potassium in the blood, prevention of low potassium in the blood    semaglutide  (OZEMPIC ) 1 mg/dose (4 mg/3 mL) Subcutaneous Pen Injector Inject 1 mg under the skin Every 7 days Indications: type 2 diabetes mellitus, weight loss management for a person with obesity        Allergies:  Allergies[1]     Social History:  Social History     Socioeconomic History    Marital status: Divorced    Number of children: 1   Occupational History    Occupation: retired: ship yards, drove Marine scientist    Tobacco Use    Smoking status: Never    Smokeless tobacco: Never   Vaping Use    Vaping status: Never Used   Substance and Sexual Activity    Alcohol use: Not Currently    Drug use: Never   Social History Narrative    Right handed.      Social Determinants of Health     Financial Resource Strain: Low Risk  (11/16/2023)    Financial Resource Strain     SDOH Financial: No   Transportation Needs: Low Risk  (11/16/2023)    Transportation Needs     SDOH Transportation: No   Social Connections: Low Risk  (11/16/2023)    Social Connections     SDOH Social Isolation: 5 or more times a week   Intimate Partner Violence: Low Risk  (11/16/2023)    Intimate Partner Violence     SDOH  Domestic Violence: No   Housing Stability: Low Risk  (11/16/2023)    Housing Stability     SDOH Housing Situation: I have housing.  SDOH Housing Worry: No        Family History:  Family Medical History:       Problem Relation (Age of Onset)    Cancer Mother, Sister    Gout Father    Heart Disease Mother, Darin Lincoln                 General Physical Exam:  Ht 1.753 m (5' 9)   Wt 92.5 kg (204 lb)   BMI 30.13 kg/m     Healthy-appearing pleasant gentleman no acute distress.  Alert and oriented x3.    Physical Exam:    Moderate tenderness to palpation midline cervical and lumbar spine.      Neurological Examination:    Mental Status: Alert and oriented x3. Normal speech without aphasia or dysarthria. Normal attention. Normal concentration. Normal memory and normal fund of knowledge.     Gait: Gait and station are normal.     Right Upper Extremity:   Motor: 5/5 in all major muscle groups.   Sensory: Intact to light touch.   Reflexes: 2 in the extremity. Hoffman's signs are negative.  Left Upper Extremity:   Motor: 5/5 in all major muscle groups.   Sensory: Intact to light touch.   Reflexes: 2 in the extremity. Hoffman's signs are negative.  Right Lower Extremity:   Motor: 5/5 in all major muscle groups.   Sensory: Intact to light touch.   Reflexes: 2 in the extremity. Clonus negative.  Left Lower Extremity:   Motor: 5/5 in all major muscle groups.   Sensory: Intact to light touch.   Reflexes: 2 in the extremity. Clonus negative.      Bilateral painless hip range of motion.  Bilateral painless knee range of motion.     Diagnostic Studies:  AP lateral cervical spine x-rays with flexion-extension show multilevel anterior fusions from C3-4 through C6-7.  No gross instability above or below.  AP lateral lumbar spine x-rays with flexion-extension show no instability.  Moderate spondylitic change.    I personally reviewed the patient's films in the office today.     Assessment/Plan:  ENCOUNTER DIAGNOSES     ICD-10-CM    1. Numbness and tingling in right hand  R20.0    R20.2   2. Degenerative cervical spinal stenosis  M48.02   3. Low back pain  M54.50   4. Bilateral leg numbness  R20.0   5. Abnormal coagulation profile  R79.1       Patient does have known right-sided C7-T1 foraminal stenosis.  We had talked previously about getting a CT myelogram to better evaluate this and I will do so now.  This will be done in anticipation of cervical surgery probably in the early fall.  He wants to get through the summer and continue his yd work for now.  In terms of his low back he has significant spondylitic change and I will send him for some physical therapy and core strengthening in his here does with that.  I am also going to send him for a cervical epidural to see if that relieves some of his arm symptoms on that right side at least until we can decompress this.  He will call me sooner if his symptoms worsen.  Otherwise I will see him back in late August to schedule surgery in October.    Thank you for your kind referral.    With Best regards,  Camellia [name], MD , 11/23/2023    This note has been  generated by a word recognition computerized program. There may be grammatical, typographical, or word substitution errors which have escaped my editorial review.          [1] No Known Allergies

## 2023-11-24 DIAGNOSIS — M4802 Spinal stenosis, cervical region: Secondary | ICD-10-CM

## 2023-11-24 DIAGNOSIS — Z981 Arthrodesis status: Secondary | ICD-10-CM

## 2023-11-25 DIAGNOSIS — R200 Anesthesia of skin: Secondary | ICD-10-CM

## 2023-11-25 DIAGNOSIS — M545 Low back pain, unspecified: Secondary | ICD-10-CM

## 2023-12-07 ENCOUNTER — Ambulatory Visit (HOSPITAL_BASED_OUTPATIENT_CLINIC_OR_DEPARTMENT_OTHER)
Admission: RE | Admit: 2023-12-07 | Discharge: 2023-12-07 | Disposition: A | Discharge status: Home / Self Care | Source: Ambulatory Visit | Admitting: Radiology

## 2023-12-07 ENCOUNTER — Encounter (HOSPITAL_COMMUNITY): Payer: Self-pay | Admitting: ORTHOPAEDIC SURGERY

## 2023-12-07 ENCOUNTER — Other Ambulatory Visit: Payer: Self-pay

## 2023-12-07 ENCOUNTER — Ambulatory Visit: Payer: Self-pay | Attending: ORTHOPAEDIC SURGERY | Admitting: ORTHOPAEDIC SURGERY

## 2023-12-07 VITALS — BP 138/93 | HR 71 | Ht 69.0 in | Wt 199.0 lb

## 2023-12-07 DIAGNOSIS — M160 Bilateral primary osteoarthritis of hip: Secondary | ICD-10-CM | POA: Insufficient documentation

## 2023-12-07 DIAGNOSIS — M7062 Trochanteric bursitis, left hip: Secondary | ICD-10-CM | POA: Insufficient documentation

## 2023-12-07 DIAGNOSIS — M7061 Trochanteric bursitis, right hip: Secondary | ICD-10-CM | POA: Insufficient documentation
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
## 2023-12-12 ENCOUNTER — Ambulatory Visit (HOSPITAL_COMMUNITY): Payer: Self-pay

## 2023-12-13 ENCOUNTER — Ambulatory Visit (HOSPITAL_COMMUNITY): Payer: Self-pay

## 2023-12-16 DIAGNOSIS — M160 Bilateral primary osteoarthritis of hip: Secondary | ICD-10-CM

## 2024-01-11 ENCOUNTER — Ambulatory Visit
Admission: RE | Admit: 2024-01-11 | Discharge: 2024-01-11 | Disposition: A | Discharge status: Home / Self Care | Payer: Self-pay | Source: Ambulatory Visit | Attending: PULMONARY DISEASE | Admitting: PULMONARY DISEASE

## 2024-01-11 ENCOUNTER — Other Ambulatory Visit: Payer: Self-pay

## 2024-01-11 DIAGNOSIS — M160 Bilateral primary osteoarthritis of hip: Secondary | ICD-10-CM | POA: Insufficient documentation

## 2024-01-11 DIAGNOSIS — M7062 Trochanteric bursitis, left hip: Secondary | ICD-10-CM | POA: Insufficient documentation

## 2024-01-11 DIAGNOSIS — M7061 Trochanteric bursitis, right hip: Secondary | ICD-10-CM | POA: Insufficient documentation

## 2024-01-11 MED ORDER — GADOBENATE DIMEGLUMINE 529 MG/ML(0.1 MMOL/0.2 ML) INTRAVENOUS SOLUTION
18.0000 mL | INTRAVENOUS | Status: AC
Start: 2024-01-11 — End: 2024-01-11
  Administered 2024-01-11: 18 mL via INTRAVENOUS

## 2024-01-12 ENCOUNTER — Encounter (INDEPENDENT_AMBULATORY_CARE_PROVIDER_SITE_OTHER): Payer: Self-pay | Admitting: Family Medicine

## 2024-01-12 ENCOUNTER — Other Ambulatory Visit (HOSPITAL_COMMUNITY): Payer: Self-pay

## 2024-01-12 DIAGNOSIS — M7061 Trochanteric bursitis, right hip: Secondary | ICD-10-CM

## 2024-01-12 LAB — PT/INR
INR: 0.95
PROTHROMBIN TIME: 10.8
PTT: 27.9

## 2024-01-12 LAB — PLATELET COUNT: PLATELET COUNT: 228

## 2024-01-17 ENCOUNTER — Other Ambulatory Visit (INDEPENDENT_AMBULATORY_CARE_PROVIDER_SITE_OTHER): Payer: Self-pay | Admitting: Family Medicine

## 2024-01-17 ENCOUNTER — Encounter (INDEPENDENT_AMBULATORY_CARE_PROVIDER_SITE_OTHER): Payer: Self-pay | Admitting: Family Medicine

## 2024-01-17 DIAGNOSIS — J4540 Moderate persistent asthma, uncomplicated: Secondary | ICD-10-CM

## 2024-01-17 NOTE — Telephone Encounter (Signed)
 Last Visit:11/16/2023     Upcoming appointments: 02/16/2024           Lolita Macadam, MA  01/17/2024, 08:17

## 2024-01-18 ENCOUNTER — Other Ambulatory Visit (HOSPITAL_COMMUNITY): Payer: Self-pay

## 2024-01-18 DIAGNOSIS — R200 Anesthesia of skin: Secondary | ICD-10-CM

## 2024-01-18 DIAGNOSIS — M545 Low back pain, unspecified: Secondary | ICD-10-CM

## 2024-01-19 ENCOUNTER — Encounter (INDEPENDENT_AMBULATORY_CARE_PROVIDER_SITE_OTHER): Payer: Self-pay | Admitting: Family Medicine

## 2024-01-20 ENCOUNTER — Other Ambulatory Visit (INDEPENDENT_AMBULATORY_CARE_PROVIDER_SITE_OTHER): Payer: Self-pay | Admitting: Family Medicine

## 2024-01-20 DIAGNOSIS — M5030 Other cervical disc degeneration, unspecified cervical region: Secondary | ICD-10-CM

## 2024-01-20 DIAGNOSIS — M170 Bilateral primary osteoarthritis of knee: Secondary | ICD-10-CM

## 2024-01-20 NOTE — Telephone Encounter (Signed)
 Last Visit:11/16/2023     Upcoming appointments: 02/16/2024           Lolita Macadam, MA  01/20/2024, 08:04

## 2024-01-24 ENCOUNTER — Other Ambulatory Visit: Payer: Self-pay

## 2024-01-24 ENCOUNTER — Encounter (HOSPITAL_COMMUNITY): Payer: Self-pay | Admitting: ORTHOPAEDIC SURGERY

## 2024-01-24 ENCOUNTER — Other Ambulatory Visit (HOSPITAL_COMMUNITY): Payer: Self-pay

## 2024-01-24 ENCOUNTER — Ambulatory Visit: Payer: Self-pay | Attending: ORTHOPAEDIC SURGERY | Admitting: ORTHOPAEDIC SURGERY

## 2024-01-24 DIAGNOSIS — M6588 Other synovitis and tenosynovitis, other site: Secondary | ICD-10-CM

## 2024-01-24 DIAGNOSIS — M1611 Unilateral primary osteoarthritis, right hip: Secondary | ICD-10-CM

## 2024-01-24 DIAGNOSIS — M21251 Flexion deformity, right hip: Secondary | ICD-10-CM

## 2024-01-24 DIAGNOSIS — M7061 Trochanteric bursitis, right hip: Secondary | ICD-10-CM | POA: Insufficient documentation

## 2024-01-24 NOTE — Progress Notes (Signed)
 [facility]  DEPARTMENT OF [place], [place]    Progress Note  01/24/2024      Name: Steve Young  DOB: 1960/08/14  MRN: [id number]    CC  - BL hip pain    SUBJECTIVE:  63 y.o. male who is here for follow-up of MRI results. Last OV with Dr. Calla 12/07/2023. At that time with benign hip examination with symptoms more so consistent with GT bursitis; imaging with mild arthritis. Historically bursa injection without benefit. PT script was provided at that time and MRI pelvis ordered. He is with known cervical neck pain, R arm radicular pain, lower back pain with BL sciatica symptoms; follows with [facility] Dr. Fraser who recommended PT and core strengthening at last OV.  He underwent CT myelogram and cervical epidural for neck/arm pain. Currently, he is with tentative plan for surgery in October.     Today, he reports no change in symptoms. He did not start PT as the facility canceled the previous 2 appointments. No new complaints. Pain remains the same.       OBJECTIVE:  There were no vitals taken for this visit.      RLE:  -- ROM R: painful rotation. IR pain lateral hip; ER pain groin. Mild antalgic gait. Flexion 120  -- Sensation: SILT to dorsal and plantar foot  -- Motor:  + ankle dorsiflexion and plantarflexion  -- CV: WWP      IMAGING:  - Previous imaging with BL femoral CAM deformity. With very mild joint space narrowing posteriorly. With degenerative spine changes.   - MRI imaging reviewed with R GT bursitis and synovitis. No sign of PVNS.  No joint effusion, atrophy or change adductor/abductor muscles. Mild changes in cartilage. No intrapelvic lesion.       ASSESSMENT:  63 y.o. male mild posterior hip arthritic changes and CAM deformity    PLAN:  - Symptoms remain consistent with GT bursitis and synovitis. MRI without PVNS and cartilage is with mild change.   - Spinal pathology likely contributing to ongoing symptoms.   - New PT script provided. Recommendation  for patient to try another facility to work on stretching, core and back work. Home stretching handout provided. Again discussed strengthening exercises worsening/exacerbating symptoms.   - Continue f/u with Dr. Fraser for known spinal issues.  - Weightbearing: WBAT. Try cane in L hand.   - Follow-up: RTC prn. Historically with no benefit to bursa injection or epidural injections. Can consider intraarticular hip injection if with ongoing symptoms.       - All questions answered and patient/family is in agreement with the plan. Patient knows to call should they have any further questions or concerns.   - This record was prepared with the assistance of a voice recognition technology.  If clarification or corrections needed for patient care, please contact the author of this report.  Inadvertent areas and documentation due to improper speech recognition as possible due to this.    --  Dr. Calla readily available for consultation and collaboration. Note forwarded to Dr. Calla for review.     Harlene Olives, PA  01/24/2024, 16:00  Department of Orthopaedics    Total time spent on encounter was 35 minutes. In addition to face to face time with the patient performing history and exam, this includes time spent before and after the visit reviewing records, personally reviewing prior imaging, reviewing prior cardiac studies as available, reviewing prior outpatient notes as available, reviewing the  patient's medication record, coordinating with office staff and time spent performing final documentation in the medical record.     ATTESTATION:     I saw and examined the patient as part of a shared service with an APP. I reviewed the midlevel provider's note. I agree with the findings as documented in the above note. Any exceptions / additions are edited / noted. I have reviewed my clinical examination as well as new imaging findings with the patient.  My substantive findings are:     Still have significant back and  lateral-sided bursitis type of pain.  Not complaining of any groin pain.  Range of motion causes some stretching sensations more so laterally but does not have much groin pain.  Still with significant radicular symptoms that go down into his toes, he says currently his neck is the biggest issue.  He says he is getting surgery here soon for that.  It is not going to physical therapy for his hip.     We will look in his MRI do not think this represents PVNS, there was some mild synovitis.  He had a PCP can consider a workup for inflammatory arthropathy.  There is some signal indicating some greater trochanteric bursitis.  Did not see any obvious bony lesions or masses.  We gave him a home program, demonstrated a lot of these he has stretches and exercises as prior.  Also provided him with a physical therapy prescription.  Could consider a bursa injection, says he has gotten 1 before it did not find it too helpful.  Risks and benefits of these discussed.  If still symptomatic, could also consider an intra-articular injection for diagnostic and therapeutic reasons, given minimal cartilage wear I do not think he is indicated for a hip replacement at this time, I do not think it will significantly help with his symptoms at this time.  Maybe a candidate in the future, however I do think a lot of his pain is myofascial, potentially related to the spine.  We will continue to follow him periodically.  Get repeat x-rays in 6 months.     This record was prepared with assistance of voice recognition technology. If clarification or correction is needed for patient care, please contact the author of this report. Inadvertent errors in documentation due to improper speech recognition is possible due to this.      - Selinda Cory, MD      Vernell Sauce, MD  [address]  [place] [address]     No referring provider defined for this encounter.

## 2024-02-06 ENCOUNTER — Encounter (HOSPITAL_COMMUNITY): Payer: Self-pay | Admitting: ORTHOPAEDIC SURGERY

## 2024-02-06 ENCOUNTER — Other Ambulatory Visit: Payer: Self-pay

## 2024-02-06 ENCOUNTER — Ambulatory Visit: Payer: Self-pay | Attending: ORTHOPAEDIC SURGERY | Admitting: ORTHOPAEDIC SURGERY

## 2024-02-06 VITALS — Ht 69.0 in | Wt 195.0 lb

## 2024-02-06 DIAGNOSIS — M4802 Spinal stenosis, cervical region: Secondary | ICD-10-CM | POA: Insufficient documentation

## 2024-02-06 NOTE — Addendum Note (Signed)
 Addended by: LORRAIN LAUREL B on: 02/06/2024 12:25 PM     Modules accepted: Orders

## 2024-02-06 NOTE — Progress Notes (Signed)
 63 year old gentleman back for follow up.  He has had his CT myelogram done.  He continues to have severe right arm radiculopathy in about a C8 distribution.  He also has been having some significant weakness in his hand.      On examination: Healthy-appearing pleasant gentleman no acute distress.  Alert and oriented x3.  5/5 strength in bilateral upper extremities with the exception of his grip and interosseous on the right which are 4/5 and 5- out of 5 respectively.  No upper motor neuron signs.      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    Impression plan:  Due to the patient's weakness in his hand in his persistent severe radiculopathy we will plan for posterior cervical decompression and fusion at C7-T1.  We will do a right-sided C7-T1 foraminotomy.  Patient understands the risks and benefits of surgery and wished to proceed.  We will plan for surgery as soon as we can get things arranged.

## 2024-02-07 NOTE — Assessment & Plan Note (Signed)
-  stable- follows with ***  -continue taking ***

## 2024-02-07 NOTE — Assessment & Plan Note (Signed)
-  BP today: 145/90  -daily asa   -no BP meds

## 2024-02-07 NOTE — Assessment & Plan Note (Signed)
-  A1c: 5.4 10/12/23  -will recheck labs  -no longer taking metformin    -continue taking ozempic  (mainly using for weight loss per patient)

## 2024-02-07 NOTE — Assessment & Plan Note (Signed)
-  Patient has failed conservative therapy, operative management is planned   -Pre-operative labs, CXR and EKG ordered, pending  -Pain management as per surgical team. Continue conservative symptom relief until operative management.    -Recommended Antibiotic prophylaxis: ancef  -Recommended VTE prophylaxis: per primary

## 2024-02-07 NOTE — Assessment & Plan Note (Signed)
 Lab Results   Component Value Date    CHOLESTEROL 107 03/29/2023    HDLCHOL 29 (L) 03/29/2023    LDLCHOL 57 03/29/2023    TRIG 112 03/29/2023     -continue taking atorvastatin  40 mg

## 2024-02-07 NOTE — Assessment & Plan Note (Signed)
-  check lab; continue supplement

## 2024-02-07 NOTE — Assessment & Plan Note (Signed)
-  not in exacerbation  -continue use of prn nebs and scheduled inhalers

## 2024-02-07 NOTE — Progress Notes (Unsigned)
 [name], [name]   Orthopaedic Medical Optimization Program  Consult     [name], [name], 63 y.o. male Surgeon: Dr. Fraser   Date of Birth:  05-May-1961 Date of Service:  02/09/2024    Medical Record Number: [id number]      Pre-operative examination  - Patient is at acceptable risk of perioperative complications for the planned moderate risk surgery pending lab results, EKG, and below clearances.   - Clearances pending: preop testing         Assessment & Plan  Preop examination  Cervical stenosis of spinal canal  Degenerative cervical spinal stenosis  -Patient has failed conservative therapy, operative management is planned   -Pre-operative labs, CXR and EKG ordered, pending  -Pain management as per surgical team. Continue conservative symptom relief until operative management.    -Recommended Antibiotic prophylaxis: ancef  -Recommended VTE prophylaxis: per primary     Essential hypertension  -BP today: 145/90  -daily asa   -no BP meds    Hypertriglyceridemia  Mixed hyperlipidemia  Lab Results   Component Value Date    CHOLESTEROL 107 03/29/2023    HDLCHOL 29 (L) 03/29/2023    LDLCHOL 57 03/29/2023    TRIG 112 03/29/2023     -continue taking atorvastatin  40 mg     Metabolic dysfunction-associated steatotic liver disease (MASLD)  -check labs     Moderate persistent asthma without complication  -not in exacerbation  -continue use of prn nebs and scheduled inhalers    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 Recommend scheduled antiemetic.      Orders Placed This Encounter     MRSA/MSSA COLONIZATION SCREEN, PCR    XR CHEST AP    CBC    COMPREHENSIVE METABOLIC PANEL, NON-FASTING    HGA1C (HEMOGLOBIN A1C WITH EST AVG GLUCOSE)    PT/INR    URINALYSIS WITH REFLEX MICROSCOPIC AND CULTURE IF POSITIVE    VITAMIN D  25 TOTAL    ECG 12 LEAD         HPI:  Steve Young is a 63 y.o. male that presents with need for pre-operative medical  optimization.     Patient denies chest pain, shortness of breath at rest, dyspnea on exertion, pre-syncope/syncope, palpitations, orthopnea, or PND  Patient denies previous DVT, family history of DVT, or sleep apnea.   Patient denies any ongoing dental issues.   Patient reports any previous problems with anesthesia.  POVN  Patient denies any family history of problems with anesthesia.        ROS:  Constitutional symptoms: fever, chills, sweats, recent loss of weight, change in appetite, fatigue, generalized weakness  Skin symptoms: rashes, lesions, purpura, redness, swelling  Eye symptoms: double vision, blurry vision, tearing, pain  ENT symptoms: difficulty with swallowing, discharge, change in hearing, drainage, dental problem  Respiratory symptoms: cough, wheeze, hemoptysis  Cardiovascular symptoms: tachycardia, palpitations, chest pain, shortness of breath at rest, dyspnea on exertion  Gastrointestinal symptoms: nausea, vomiting, diarrhea  Musculoskeletal: arthralgias and gait problem  Genitourinary symptoms: burning with urination, hematuria or frequency  Neurologic symptoms:  seizure, headache, numbness or weakness  Psychiatric symptoms: anxiety, depression or change in mood  Endocrine symptoms: excessive thirst, change in feelings of warm and cold, polyuria, polydipsia  Allergy/immunologic symptoms: new allergies or reactions  Additional review of systems information: All other systems reviewed and otherwise negative, All systems reviewed as documented in chart.   All systems were reviewed and except for the findings described in the HPI was otherwise  negative. Symptoms highlighted in bold are positive    Operative Reference    Pain contract on file with other provider: No     Patient history of DVT:   No     Recommended DVT ppx Per primary     Prophylactic antibiotics   Ancef     Patient history of   No thrombotic events     Sleep Apnea   No     Current Tobacco Use:    No     Current Drug Use:    No     Current Alcohol Use:    No     Fast Track   No   Allergy to the following: latex, metal/nickel, iodine , acrylic   No     Open wounds/rashes   No       History of chemo/radiation   No         Hx MRSA   No   History of a prosthetic joint infection/septic joint:  No     Any personal or family history of anesthesia complications?    Yes PONV     Last Steroid Injection Operative Site   N/a   Vulnerable population patient or hospitalization within 3 months?   No     Dental issues that need addressed prior to surgery:        No     Personal or family history of bleeding/clotting disorders:     No     History of pulmonary disorders:      No   A diagnosis of COVID-19/FLU in the past 90 days? No     Pacemaker, ICD, or implanted device?   No     History of MI/CVA/cardiac stents/cardiac surgeries:      No   Dietician Referral No             ASA 2 - Patient with mild systemic disease with no functional limitations        Goldman Risk Evaluation  None  The risk of major cardiac complications varies according to the number of risk factors. If one includes only cardiac death, nonfatal MI, and nonfatal cardiac arrest as major cardiac events in the [place] cohort, the following rates of adverse outcomes were seen:  No risk factors -- 0.4 percent (95% CI 0.1-0.8 percent)   One risk factor -- 1.0 percent (95% CI 0.5-1.4 percent)   Two risk factors -- 2.4 percent (95% CI 1.3-3.5 percent)   Three or more risk factors -- 5.4 percent (95% CI 2.8-7.9 percent)  *Perioperative cardiac events in patients undergoing noncardiac surgery: a review of the magnitude of the problem, the  pathophysiology of the events and methods to estimate and communicate risk.Devereaux PJ, Jerome LITTIE Bluford HILLARY Bertrum MARLA Sonny MARLA Odilia [place]. 2005;173(6):627.    Social History[1]    Current medications:  Outpatient Medications Marked as Taking for the 02/09/24 encounter (Office Visit) with Billy Light, CRNP   Medication Sig    albuterol  sulfate (PROVENTIL  OR VENTOLIN  OR PROAIR ) 90 mcg/actuation Inhalation oral inhaler Take 1-2 Puffs  by inhalation Every 4 hours as needed    albuterol  sulfate (PROVENTIL ) 2.5 mg /3 mL (0.083 %) Inhalation nebulizer solution Take 3 mL (2.5 mg total) by nebulization Four times a day as needed for Wheezing Indications: asthma attack    aspirin  (ECOTRIN) 81 mg Oral Tablet, Delayed Release (E.C.) Take 1 Tablet (81 mg total) by mouth Daily Indications: treatment to prevent a heart attack    atorvastatin  (LIPITOR) 40 mg Oral Tablet Take 1 Tablet (40 mg total) by mouth Daily Indications: high cholesterol and high triglycerides, treatment to prevent a heart attack    budesonide -formoteroL  (SYMBICORT ) 160-4.5 mcg/actuation Inhalation oral inhaler Take 2 Puffs by inhalation Twice daily Indications: controller medication for asthma    cyclobenzaprine  (FLEXERIL ) 10 mg Oral Tablet Take 1 Tablet (10 mg total) by mouth Every 8 hours as needed for Muscle spasms Indications: disorder characterized by stiff, tender & painful muscles, muscle spasm    gabapentin  (NEURONTIN ) 600 mg Oral Tablet Take 1 Tablet (600 mg total) by mouth Three times a day    Ibuprofen  (MOTRIN ) 800 mg Oral Tablet Take 1 Tablet (800 mg total) by mouth Every 8 hours as needed for Pain Indications: joint damage causing pain and loss of function, pain    levomefol-B6-meB12-algal oil (METANX) 3 mg-35 mg-2 mg -90.314 mg Oral Capsule Take 1 Capsule by mouth Twice daily    omeprazole  (PRILOSEC) 40 mg Oral Capsule, Delayed Release(E.C.) Take 1 Capsule (40 mg total) by mouth Daily Indications: gastroesophageal reflux disease    potassium  chloride (MICRO K) 10 mEq Oral Capsule, Sustained Release Take 1 Capsule (10 mEq total) by mouth Twice daily Indications: low amount of potassium in the blood, prevention of low potassium in the blood    semaglutide  (OZEMPIC ) 1 mg/dose (4 mg/3 mL) Subcutaneous Pen Injector Inject 1 mg under the skin Every 7 days Indications: type 2 diabetes mellitus, weight loss management for a person with obesity       Physical Exam:  Constitutional: well appearing, no acute distress. AOx3  Eyes: PERRL, EOM intact, no exudate   ENT: ENMT without erythema, mucous membranes moist, dentition intact  Neck: supple, thyroid  non-enlarged and non-tender  Respiratory: Clear, without wheezes, rales, or rhonchi  Cardiovascular: RRR, no murmur  Gastrointestinal: BS+, non-tender  Musculoskeletal: Head atraumatic and normocephalic  Integumentary:  Warm and dry, no edema noted-no lesions noted  Neurologic: AO, CN 2-12 normal  Lymphatic/Immunologic/Hematologic: No lymphadenopathy  Psychiatric: memory intact, normal judgement and insight, normal mood and affect    Vitals:    02/09/24 1505   BP: (!) 145/90   Pulse: 87   SpO2: 95%   Weight: 88.5 kg (195 lb)   Height: 1.753 m (5' 9)   BMI: 28.8           Time spent on encounter: 22 minutes     Andree Senters, CRNP  02/09/2024, 12:57  Orthopaedic Medical Optimization Program  Dept of Orthopaedics         [1]   Social History  Tobacco Use    Smoking status: Never    Smokeless tobacco: Never   Vaping Use    Vaping status: Never Used   Substance Use Topics    Alcohol use: Not Currently    Drug use: Never

## 2024-02-07 NOTE — Assessment & Plan Note (Signed)
 -  check labs

## 2024-02-09 ENCOUNTER — Ambulatory Visit (HOSPITAL_COMMUNITY)
Admission: RE | Admit: 2024-02-09 | Discharge: 2024-02-09 | Disposition: A | Discharge status: Home / Self Care | Source: Ambulatory Visit

## 2024-02-09 ENCOUNTER — Encounter (HOSPITAL_COMMUNITY): Payer: Self-pay

## 2024-02-09 ENCOUNTER — Ambulatory Visit

## 2024-02-09 ENCOUNTER — Other Ambulatory Visit: Payer: Self-pay

## 2024-02-09 ENCOUNTER — Ambulatory Visit (HOSPITAL_COMMUNITY)

## 2024-02-09 VITALS — BP 145/90 | HR 87 | Ht 69.0 in | Wt 195.0 lb

## 2024-02-09 DIAGNOSIS — M549 Dorsalgia, unspecified: Secondary | ICD-10-CM | POA: Insufficient documentation

## 2024-02-09 DIAGNOSIS — E66811 Obesity, class 1: Secondary | ICD-10-CM | POA: Insufficient documentation

## 2024-02-09 DIAGNOSIS — E559 Vitamin D deficiency, unspecified: Secondary | ICD-10-CM

## 2024-02-09 DIAGNOSIS — E1169 Type 2 diabetes mellitus with other specified complication: Secondary | ICD-10-CM

## 2024-02-09 DIAGNOSIS — M545 Low back pain, unspecified: Secondary | ICD-10-CM | POA: Insufficient documentation

## 2024-02-09 DIAGNOSIS — M4802 Spinal stenosis, cervical region: Secondary | ICD-10-CM | POA: Insufficient documentation

## 2024-02-09 DIAGNOSIS — E781 Pure hyperglyceridemia: Secondary | ICD-10-CM | POA: Insufficient documentation

## 2024-02-09 DIAGNOSIS — M160 Bilateral primary osteoarthritis of hip: Secondary | ICD-10-CM

## 2024-02-09 DIAGNOSIS — K219 Gastro-esophageal reflux disease without esophagitis: Secondary | ICD-10-CM | POA: Insufficient documentation

## 2024-02-09 DIAGNOSIS — F4310 Post-traumatic stress disorder, unspecified: Secondary | ICD-10-CM | POA: Insufficient documentation

## 2024-02-09 DIAGNOSIS — E119 Type 2 diabetes mellitus without complications: Secondary | ICD-10-CM | POA: Insufficient documentation

## 2024-02-09 DIAGNOSIS — E782 Mixed hyperlipidemia: Secondary | ICD-10-CM | POA: Insufficient documentation

## 2024-02-09 DIAGNOSIS — Z01818 Encounter for other preprocedural examination: Secondary | ICD-10-CM

## 2024-02-09 DIAGNOSIS — J4540 Moderate persistent asthma, uncomplicated: Secondary | ICD-10-CM | POA: Insufficient documentation

## 2024-02-09 DIAGNOSIS — F39 Unspecified mood [affective] disorder: Secondary | ICD-10-CM | POA: Insufficient documentation

## 2024-02-09 DIAGNOSIS — E669 Obesity, unspecified: Secondary | ICD-10-CM | POA: Insufficient documentation

## 2024-02-09 DIAGNOSIS — R791 Abnormal coagulation profile: Secondary | ICD-10-CM | POA: Insufficient documentation

## 2024-02-09 DIAGNOSIS — R200 Anesthesia of skin: Secondary | ICD-10-CM | POA: Insufficient documentation

## 2024-02-09 DIAGNOSIS — Z9889 Other specified postprocedural states: Secondary | ICD-10-CM | POA: Insufficient documentation

## 2024-02-09 DIAGNOSIS — K760 Fatty (change of) liver, not elsewhere classified: Secondary | ICD-10-CM | POA: Insufficient documentation

## 2024-02-09 DIAGNOSIS — M79606 Pain in leg, unspecified: Secondary | ICD-10-CM

## 2024-02-09 DIAGNOSIS — R112 Nausea with vomiting, unspecified: Secondary | ICD-10-CM | POA: Insufficient documentation

## 2024-02-09 DIAGNOSIS — I10 Essential (primary) hypertension: Secondary | ICD-10-CM | POA: Insufficient documentation

## 2024-02-09 DIAGNOSIS — Z7985 Long-term (current) use of injectable non-insulin antidiabetic drugs: Secondary | ICD-10-CM

## 2024-02-09 LAB — CBC
HCT: 41.7 % (ref 38.9–52.0)
HGB: 14.5 g/dL (ref 13.4–17.5)
MCH: 29.5 pg (ref 26.0–32.0)
MCHC: 34.8 g/dL (ref 31.0–35.5)
MCV: 84.8 fL (ref 78.0–100.0)
MPV: 11 fL (ref 8.7–12.5)
PLATELETS: 228 x10ˆ3/uL (ref 150–400)
RBC: 4.92 x10ˆ6/uL (ref 4.50–6.10)
RDW-CV: 13.2 % (ref 11.5–15.5)
WBC: 7.1 x10ˆ3/uL (ref 3.7–11.0)

## 2024-02-09 LAB — COMPREHENSIVE METABOLIC PANEL, NON-FASTING
ALBUMIN: 3.9 g/dL (ref 3.4–4.8)
ALKALINE PHOSPHATASE: 84 U/L (ref 45–115)
ALT (SGPT): 13 U/L (ref <43)
ANION GAP: 7 mmol/L (ref 4–13)
AST (SGOT): 12 U/L (ref 11–34)
BILIRUBIN TOTAL: 1.5 mg/dL — ABNORMAL HIGH (ref 0.3–1.3)
BUN/CREA RATIO: 10 (ref 6–22)
BUN: 10 mg/dL (ref 8–25)
CALCIUM: 9.2 mg/dL (ref 8.6–10.3)
CHLORIDE: 110 mmol/L (ref 96–111)
CO2 TOTAL: 25 mmol/L (ref 23–31)
CREATININE: 0.96 mg/dL (ref 0.75–1.35)
GLUCOSE: 94 mg/dL (ref 65–125)
POTASSIUM: 3.7 mmol/L (ref 3.5–5.1)
PROTEIN TOTAL: 6.1 g/dL (ref 5.6–7.6)
SODIUM: 142 mmol/L (ref 136–145)
eGFRcr - MALE: 88 mL/min/1.73mˆ2 (ref >=60)

## 2024-02-09 LAB — URINALYSIS, MACRO/MICRO
BILIRUBIN: NEGATIVE mg/dL
BLOOD: NEGATIVE mg/dL
GLUCOSE: NEGATIVE mg/dL
LEUKOCYTES: NEGATIVE WBCs/uL
NITRITE: NEGATIVE
PH: 6 (ref 5.0–9.0)
PROTEIN: NEGATIVE mg/dL
SPECIFIC GRAVITY: 1.02 (ref 1.001–1.030)
UROBILINOGEN: 1 mg/dL (ref 0.2–1.0)

## 2024-02-09 LAB — COMPREHENSIVE METABOLIC PNL, FASTING
ALBUMIN: 3.9 g/dL (ref 3.4–4.8)
ALKALINE PHOSPHATASE: 84 U/L (ref 45–115)
ALT (SGPT): 13 U/L (ref <43)
ANION GAP: 6 mmol/L (ref 4–13)
AST (SGOT): 12 U/L (ref 11–34)
BILIRUBIN TOTAL: 1.5 mg/dL — ABNORMAL HIGH (ref 0.3–1.3)
BUN/CREA RATIO: 11 (ref 6–22)
BUN: 10 mg/dL (ref 8–25)
CALCIUM: 9 mg/dL (ref 8.6–10.3)
CHLORIDE: 110 mmol/L (ref 96–111)
CO2 TOTAL: 26 mmol/L (ref 23–31)
CREATININE: 0.94 mg/dL (ref 0.75–1.35)
GLUCOSE: 92 mg/dL (ref 70–99)
POTASSIUM: 3.6 mmol/L (ref 3.5–5.1)
PROTEIN TOTAL: 6.1 g/dL (ref 5.6–7.6)
SODIUM: 142 mmol/L (ref 136–145)
eGFRcr - MALE: >90 mL/min/1.73mˆ2 (ref >=60)

## 2024-02-09 LAB — VITAMIN D 25 TOTAL: VITAMIN D 25, TOTAL: 39 ng/mL (ref 20.0–100.0)

## 2024-02-09 LAB — PT/INR
INR: 0.98 (ref 0.90–1.10)
PROTHROMBIN TIME: 10.2 s (ref 9.0–13.0)

## 2024-02-09 LAB — MICROALBUMIN/CREATININE RATIO, URINE, RANDOM
CREATININE RANDOM URINE: 243.41 mg/dL
MICROALBUMIN RANDOM URINE: 2.1 mg/dL
MICROALBUMIN/CREATININE RATIO RANDOM URINE: 8.6 mg/g (ref <30.0)

## 2024-02-09 LAB — MRSA/MSSA COLONIZATION SCREEN, PCR
MRSA COLONIZATION SCREEN: NEGATIVE
STAPHYLOCOCCUS AUREUS: POSITIVE — AB

## 2024-02-09 LAB — HGA1C (HEMOGLOBIN A1C WITH EST AVG GLUCOSE)
ESTIMATED AVERAGE GLUCOSE: 111 mg/dL
HEMOGLOBIN A1C: 5.5 % (ref <=5.7)

## 2024-02-09 LAB — URIC ACID: URIC ACID: 4.9 mg/dL (ref 3.5–7.5)

## 2024-02-13 DIAGNOSIS — Z01818 Encounter for other preprocedural examination: Secondary | ICD-10-CM

## 2024-02-15 ENCOUNTER — Ambulatory Visit: Attending: Family Medicine

## 2024-02-15 ENCOUNTER — Ambulatory Visit (HOSPITAL_BASED_OUTPATIENT_CLINIC_OR_DEPARTMENT_OTHER)
Admission: RE | Admit: 2024-02-15 | Discharge: 2024-02-15 | Disposition: A | Discharge status: Home / Self Care | Source: Ambulatory Visit | Attending: Family Medicine | Admitting: Family Medicine

## 2024-02-15 ENCOUNTER — Ambulatory Visit (HOSPITAL_BASED_OUTPATIENT_CLINIC_OR_DEPARTMENT_OTHER)
Admission: RE | Admit: 2024-02-15 | Discharge: 2024-02-15 | Disposition: A | Source: Ambulatory Visit | Attending: Family Medicine

## 2024-02-15 ENCOUNTER — Other Ambulatory Visit: Payer: Self-pay

## 2024-02-15 DIAGNOSIS — M549 Dorsalgia, unspecified: Secondary | ICD-10-CM

## 2024-02-15 DIAGNOSIS — M79641 Pain in right hand: Secondary | ICD-10-CM

## 2024-02-15 DIAGNOSIS — M160 Bilateral primary osteoarthritis of hip: Secondary | ICD-10-CM

## 2024-02-15 DIAGNOSIS — M4802 Spinal stenosis, cervical region: Secondary | ICD-10-CM | POA: Insufficient documentation

## 2024-02-15 DIAGNOSIS — E669 Obesity, unspecified: Secondary | ICD-10-CM | POA: Insufficient documentation

## 2024-02-15 DIAGNOSIS — E119 Type 2 diabetes mellitus without complications: Secondary | ICD-10-CM | POA: Insufficient documentation

## 2024-02-15 DIAGNOSIS — Z01818 Encounter for other preprocedural examination: Secondary | ICD-10-CM

## 2024-02-15 LAB — ECG 12 LEAD
Atrial Rate: 71 {beats}/min
Calculated P Axis: 57 degrees
Calculated R Axis: 43 degrees
Calculated T Axis: 33 degrees
PR Interval: 224 ms
QRS Duration: 94 ms
QT Interval: 382 ms
QTC Calculation: 415 ms
Ventricular rate: 71 {beats}/min

## 2024-02-15 LAB — LIPID PANEL
CHOL/HDL RATIO: 2.7
CHOLESTEROL: 92 mg/dL — ABNORMAL LOW (ref 100–200)
HDL CHOL: 34 mg/dL — ABNORMAL LOW (ref >=50)
LDL CALC: 43 mg/dL (ref <100)
NON-HDL: 58 mg/dL (ref <=190)
TRIGLYCERIDES: 66 mg/dL (ref <150)
VLDL CALC: 9 mg/dL (ref <30)

## 2024-02-16 ENCOUNTER — Encounter (INDEPENDENT_AMBULATORY_CARE_PROVIDER_SITE_OTHER): Payer: Self-pay | Admitting: Family Medicine

## 2024-02-16 ENCOUNTER — Ambulatory Visit (INDEPENDENT_AMBULATORY_CARE_PROVIDER_SITE_OTHER): Payer: Self-pay | Admitting: Family Medicine

## 2024-02-16 VITALS — BP 120/76 | HR 84 | Temp 97.2°F | Resp 16 | Ht 69.0 in | Wt 193.0 lb

## 2024-02-16 DIAGNOSIS — E785 Hyperlipidemia, unspecified: Secondary | ICD-10-CM

## 2024-02-16 DIAGNOSIS — M5030 Other cervical disc degeneration, unspecified cervical region: Secondary | ICD-10-CM

## 2024-02-16 DIAGNOSIS — M19041 Primary osteoarthritis, right hand: Secondary | ICD-10-CM

## 2024-02-16 DIAGNOSIS — M792 Neuralgia and neuritis, unspecified: Secondary | ICD-10-CM

## 2024-02-16 DIAGNOSIS — E1169 Type 2 diabetes mellitus with other specified complication: Secondary | ICD-10-CM

## 2024-02-16 DIAGNOSIS — Z7985 Long-term (current) use of injectable non-insulin antidiabetic drugs: Secondary | ICD-10-CM

## 2024-02-16 DIAGNOSIS — M79641 Pain in right hand: Secondary | ICD-10-CM

## 2024-02-16 DIAGNOSIS — M62838 Other muscle spasm: Secondary | ICD-10-CM

## 2024-02-16 DIAGNOSIS — F4310 Post-traumatic stress disorder, unspecified: Secondary | ICD-10-CM

## 2024-02-16 DIAGNOSIS — F5104 Psychophysiologic insomnia: Secondary | ICD-10-CM

## 2024-02-16 DIAGNOSIS — E876 Hypokalemia: Secondary | ICD-10-CM

## 2024-02-16 DIAGNOSIS — I10 Essential (primary) hypertension: Secondary | ICD-10-CM

## 2024-02-16 MED ORDER — AMITRIPTYLINE 25 MG TABLET
25.0000 mg | ORAL_TABLET | Freq: Every evening | ORAL | 3 refills | Status: AC
Start: 2024-02-16 — End: ?

## 2024-02-16 MED ORDER — METHOCARBAMOL 750 MG TABLET
750.0000 mg | ORAL_TABLET | Freq: Three times a day (TID) | ORAL | 3 refills | Status: DC | PRN
Start: 2024-02-16 — End: 2024-03-12

## 2024-02-16 MED ORDER — CENTRUM COMPLETE 18 MG-400 MCG TABLET
1.0000 | ORAL_TABLET | Freq: Every day | ORAL | 3 refills | Status: AC
Start: 2024-02-16 — End: ?

## 2024-02-16 NOTE — Nursing Note (Signed)
 Pt is here today for 3 month follow up. Still having right hand pain. Hasn't had diabetic eye exam this year.

## 2024-02-16 NOTE — H&P (Signed)
 [facility], [facility]   Orthopaedic Medical Optimization Program  History and Physical     Steve Young, Steve Young, 63 y.o. male Medical Record Number: [id number]   Date of Birth:  01/27/1961 Date of Service:  02/28/2024    Information Obtained from: patient and chart review Surgeon:  Dr. Fraser     CHIEF COMPLAINT  Pre-Operative History and Physical Examination  requested by Dr. Fraser.     Special Considerations for Surgery:    -patient states he becomes severely nauseated with vomiting post-op. Recommend scheduled antiemetic.       ASSESSMENT & PLAN  Preop examination  Cervical stenosis of spinal canal  Degenerative cervical spinal stenosis  -Patient has failed conservative therapy, operative management is planned   -Pre-operative labs, CXR and EKG reviewed- no acute abnormalities noted'  -T&S ordered, Mupirocin  ordered  -Pain management as per surgical team. Continue conservative symptom relief until operative management.     -Recommended Antibiotic prophylaxis: ancef  -Recommended VTE prophylaxis: per primary      Essential hypertension  -BP today: 130/85  -daily asa (stop one week prior to surgery)  -no BP meds     Hypertriglyceridemia  Mixed hyperlipidemia  Lab Results   Component Value Date    CHOLESTEROL 92 (L) 02/15/2024    HDLCHOL 34 (L) 02/15/2024    LDLCHOL 43 02/15/2024    TRIG 66 02/15/2024         -continue taking atorvastatin  40 mg      Metabolic dysfunction-associated steatotic liver disease (MASLD)   Latest Reference Range & Units 02/09/24 16:00   TOTAL PROTEIN 5.6 - 7.6 g/dL  5.6 - 7.6 g/dL 6.1  6.1   ALBUMIN 3.4 - 4.8 g/dL  3.4 - 4.8 g/dL 3.9  3.9   BILIRUBIN, TOTAL 0.3 - 1.3 mg/dL  0.3 - 1.3 mg/dL 1.5 (H)  1.5 (H)   AST (SGOT) 11 - 34 U/L  11 - 34 U/L 12  12   ALT (SGPT) <43 U/L  <43 U/L 13  13   ALKALINE PHOSPHATASE 45 - 115 U/L  45 - 115 U/L 84  84   (H): Data is abnormally high       Moderate persistent asthma without complication  -not in exacerbation  -continue  use of prn nebs and scheduled inhalers     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 Recommend scheduled antiemetic.    MSSA +       Orders Placed This Encounter    TYPE AND SCREEN    mupirocin  (BACTROBAN ) 2 % Ointment     The patient was provided with the following medication instructions prior to surgery, which is tentatively scheduled on 03/07/24:    14 days before surgery: hold over the counter supplements, vitamins, and multivitamins (Garlic, Gingko, St. John's Wart, Fish Oil, Omega 3 Fatty Acids). Hold starting 10/8    7 days before surgery: hold Aspirin  and any aspirin -containing products (Excedrin, Alka seltzer, Pepto Bismol, Goody's powder). Hold starting 10/15  You must hold your Ozempic , Wegovy , Victoza, or Mounjaro 7 days prior to surgery. This medication must be out of your body for >7+ days prior to your scheduled surgery date, so if your next injection falls within this time frame, do not take your medication. 10/15    7 days before surgery: hold NSAIDs/anti-inflammatories (Advil , Aleve  (naproxen ), Mobic (meloxicam), Voltaren (diclofenac ), Motrin  (ibuprofen ). Hold starting 10/15      Take the remainder of your medications as prescribed. Please notify the office of any medication changes (new medication started, medication stopped, or dose changes). Failure to notify the office of these medication changes can result in the cancellation of surgery.    Tylenol  is safe to take, including on the morning of  surgery.  _______________________________________________________________________________________________    AFTER SURGERY: It is normal for blood pressure to be lower than usual following surgery.    -If you take medications for blood pressure, please get a blood pressure cuff and check your blood pressures at home in the morning before you take your medications and again in the afternoon.  -If your blood pressure is consistently higher than 130 systolic (top number) for a few days then you can restart your blood pressure medications one at a time.  -If your blood pressure is higher than 160 systolic (top number) resume all your regular home blood pressure medicines.        Operative Reference     Pain contract on file with other provider: No      Patient history of DVT:    No      Recommended DVT ppx Per primary      Prophylactic antibiotics    Ancef      Patient history of    No thrombotic events      Sleep Apnea    No      Current Tobacco Use:     No      Current Drug Use:     No      Current Alcohol Use:     No      Fast Track    No   Allergy to the following: latex, metal/nickel, iodine , acrylic    No      Open wounds/rashes    No         History of chemo/radiation    No           Hx MRSA    No   History of a prosthetic joint infection/septic joint:  No      Any personal or family history of anesthesia complications?     Yes PONV      Last Steroid Injection Operative Site    N/a   Vulnerable population patient or hospitalization within 3 months?    No      Dental issues that need addressed prior to surgery:           No      Personal or family history of bleeding/clotting disorders:       No      History of pulmonary disorders:        No   A diagnosis of COVID-19/FLU in the past 90 days? No      Pacemaker, ICD, or implanted device?    No      History of MI/CVA/cardiac stents/cardiac surgeries:        No   Dietician Referral No  The following consultations have been obtained: [facility]    HISTORY OF  PRESENT ILLNESS:   Steve Young is a 63 y.o. male that presents with need for pre-operative history and physical.      The patient was seen in the [facility] clinic on 02/09/24 by myself. Since that visit, the patient denies any significant changes in their health, medications, or activity level. The patient has not been hospitalized, visited the ER or Urgent Care, or been prescribed any antibiotics or steroids since their last visit.    The patient denies being diagnosed with COVID-19 in the past 90 days.      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    Asthma     Asthma exacerbation 01/09/2023    Atherosclerosis of both carotid arteries 03/04/2020    Cervicalgia 03/21/2018    Chronic abdominal pain 10/04/2018    Chronic back pain 03/21/2018    Chronic chest pain 03/21/2018    Chronic insomnia     Constipation in male 09/13/2017    COVID 12/2022    COVID-19 04/28/2021    Degenerative cervical spinal stenosis 03/30/2023    Diverticulitis 01/09/2023    Elevated lactic acid level 01/09/2023    Esophageal reflux     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    Hepatic lesion 10/04/2018    Hyperlipidemia     Hypertension     Hypertriglyceridemia 05/15/2022    Hypokalemia 11/18/2020    Impingement syndrome of left shoulder 12/28/2017    Metabolic dysfunction-associated steatotic liver disease (MASLD) 10/04/2018    Mild anemia 02/07/2023    Mild persistent asthma without complication 03/21/2018    NAFL (nonalcoholic fatty liver)     Normal cardiac stress test 03/21/2018    Nosebleed     Obesity     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    Partial tear of common extensor tendon of elbow 12/13/2017    Pituitary tumor     Prediabetes 02/18/2020    Rib pain 10/04/2018    Sepsis 01/09/2023    Sexual dysfunction     Tear of talofibular ligament  05/30/2013    RIGHT    Type 2 diabetes mellitus with other specified complication, unspecified whether long term insulin  use (CMS HCC) 01/11/2023    Vitamin D  deficiency      Past Surgical History:   Procedure Laterality Date    COLONOSCOPY      ELBOW SURGERY Left 12/2017    HX OTHER  2004     SPINAL FUSION,ANT,EA ADNL LEVEL c3-c7    HX OTHER Right     right great toe    HX SHOULDER SURGERY Bilateral     NOSE SURGERY       Allergies[1]  Outpatient Medications Marked as Taking for the 02/28/24 encounter (Office Visit) with Billy Light, APRN, CNP   Medication Sig    albuterol  sulfate (PROVENTIL  OR VENTOLIN  OR PROAIR ) 90 mcg/actuation Inhalation oral inhaler Take 1-2 Puffs by inhalation Every 4 hours as needed    albuterol  sulfate (PROVENTIL ) 2.5 mg /3 mL (0.083 %) Inhalation nebulizer solution Take 3 mL (2.5 mg total) by nebulization Four times a day as needed for Wheezing Indications: asthma attack    amitriptyline (ELAVIL) 25 mg Oral Tablet Take 1 Tablet (25 mg total) by  mouth Every night Indications: difficulty sleeping, neuropathic pain    atorvastatin  (LIPITOR) 40 mg Oral Tablet Take 1 Tablet (40 mg total) by mouth Daily Indications: high cholesterol and high triglycerides, treatment to prevent a heart attack    budesonide -formoteroL  (SYMBICORT ) 160-4.5 mcg/actuation Inhalation oral inhaler Take 2 Puffs by inhalation Twice daily Indications: controller medication for asthma    gabapentin  (NEURONTIN ) 600 mg Oral Tablet Take 1 Tablet (600 mg total) by mouth Three times a day    multivitamin-iron-folic acid  (CENTRUM COMPLETE) 18-400 mg-mcg Oral Tablet Take 1 Tablet by mouth Daily Indications: treatment to prevent vitamin deficiency, vitamin deficiency    mupirocin  (BACTROBAN ) 2 % Ointment Apply topically Twice daily for 5 days    omeprazole  (PRILOSEC) 40 mg Oral Capsule, Delayed Release(E.C.) Take 1 Capsule (40 mg total) by mouth Daily Indications: gastroesophageal reflux disease     Social History[2]  Family  Medical History:       Problem Relation (Age of Onset)    Cancer Mother, Sister    Gout Father    Heart Disease Mother, Darin Lincoln              Exam:  BP 130/85   Pulse 95   Ht 1.753 m (5' 9)   Wt 87.6 kg (193 lb 3.2 oz)   SpO2 97%   BMI 28.53 kg/m       Body mass index is 28.53 kg/m.      Physical Exam:  Today's Physical Exam:  Constitutional: well appearing, no acute distress. AOx3  Eyes: PERRL, EOM intact, no exudate   ENT: ENMT without erythema, mucous membranes moist.  Neck: supple, thyroid  non-enlarged and non-tender  Respiratory: Clear, without wheezes, rales, or rhonchi  Cardiovascular: RRR, no murmur  Gastrointestinal: BS+, non-tender  Musculoskeletal: Head atraumatic and normocephalic  Integumentary:  Warm and dry, no edema noted-no lesions noted  Neurologic: AO, CN 2-12 normal  Lymphatic/Immunologic/Hematologic: No lymphadenopathy  Psychiatric: memory intact, normal judgement and insight, normal mood and affect      Diagnostics:  BMP   Lab Results   Component Value Date    SODIUM 142 02/09/2024    SODIUM 142 02/09/2024    POTASSIUM 3.6 02/09/2024    POTASSIUM 3.7 02/09/2024    CHLORIDE 110 02/09/2024    CHLORIDE 110 02/09/2024    CO2 26 02/09/2024    CO2 25 02/09/2024    BUN 10 02/09/2024    BUN 10 02/09/2024    CREATININE 0.94 02/09/2024    CREATININE 0.96 02/09/2024    GLUCOSENF 107 (H) 04/04/2021    GLUCOSEFAST 114 03/05/2019    ANIONGAP 6 02/09/2024    ANIONGAP 7 02/09/2024    BUNCRRATIO 11 02/09/2024    BUNCRRATIO 10 02/09/2024    GFR >90 02/09/2024    GFR 88 02/09/2024        LFTs   Lab Results   Component Value Date    AST 12 02/09/2024    AST 12 02/09/2024    ALT 13 02/09/2024    ALT 13 02/09/2024    ALKPHOS 84 02/09/2024    ALKPHOS 84 02/09/2024    TOTBILIRUBIN 1.5 (H) 02/09/2024    TOTBILIRUBIN 1.5 (H) 02/09/2024    TOTALPROTEIN 6.1 02/09/2024    TOTALPROTEIN 6.1 02/09/2024    ALBUMIN 3.9 02/09/2024    ALBUMIN 3.9 02/09/2024   No results for input(s): PREALBUMIN, LIPASE,  UROBILINOGEN, GAMMAGT, LDH, AMYLASE, AMMONIA in the last 72 hours.     CBC   Lab Results  Component Value Date    WBC 7.1 02/09/2024    HGB 14.5 02/09/2024    HCT 41.7 02/09/2024    PLTCNT 228 02/09/2024      Diff   Lab Results   Component Value Date    PMNS 58.0 01/27/2023    MONOCYTES 8.0 01/27/2023    BASOPHILS 1.0 01/27/2023    BASOPHILS <0.10 01/27/2023    PMNABS 4.52 01/27/2023    LYMPHSABS 2.46 01/27/2023    EOSABS 0.18 01/27/2023    MONOSABS 0.64 01/27/2023        Coagulation Studies   Lab Results   Component Value Date    PROTHROMTME 10.2 02/09/2024    INR 0.98 02/09/2024    APTT 25.3 01/08/2023         Inflammatory markers   No results found for: AESR, CREAPROINFLA     Recent Results (from the past 720 hours)   ECG 12 LEAD    Collection Time: 02/09/24  3:52 PM   Result Value Ref Range    Ventricular rate 71 BPM    Atrial Rate 71 BPM    PR Interval 224 ms    QRS Duration 94 ms    QT Interval 382 ms    QTC Calculation 415 ms    Calculated P Axis 57 degrees    Calculated R Axis 43 degrees    Calculated T Axis 33 degrees       Revised Goldman Cardiac Risk Index:  Of the six independent predictors of major cardiac complications, patient exhibits the following:  None           Total Points: 0    Rate of cardiac death, nonfatal myocardial infarction, and nonfatal cardiac arrest  No risk factors - 0.4 percent (95%CI 0.1-0.8 percent)  This represents the percentage risk of major cardiac complications (cardiac death, non-fatal MI, non-fatal cardiac arrest, post-operative cardiogenic pulmonary edema, and/or complete heart block).    ASA: ASA 2 - Patient with mild systemic disease with no functional limitations    METs: < 4 METs - Walking slower than 4 mph    NSQIP:  NSQIP Risk Calculator        Time spent on encounter: 20 minutes       Surgeon comments related to surgery may be added after this signature for additional information and surgical plans.    Andree Senters, APRN, CNP  02/28/2024,  10:23  Orthopaedic Medical Optimization Program  Dept of Orthopaedics               [1] No Known Allergies  [2]   Social History  Tobacco Use    Smoking status: Never    Smokeless tobacco: Never   Vaping Use    Vaping status: Never Used   Substance Use Topics    Alcohol use: Not Currently    Drug use: Never

## 2024-02-16 NOTE — H&P (View-Only) (Signed)
 [facility], [facility]   Orthopaedic Medical Optimization Program  History and Physical     Steve Young, Steve Young, 63 y.o. male Medical Record Number: [id number]   Date of Birth:  01/27/1961 Date of Service:  02/28/2024    Information Obtained from: patient and chart review Surgeon:  Dr. Fraser     CHIEF COMPLAINT  Pre-Operative History and Physical Examination  requested by Dr. Fraser.     Special Considerations for Surgery:    -patient states he becomes severely nauseated with vomiting post-op. Recommend scheduled antiemetic.       ASSESSMENT & PLAN  Preop examination  Cervical stenosis of spinal canal  Degenerative cervical spinal stenosis  -Patient has failed conservative therapy, operative management is planned   -Pre-operative labs, CXR and EKG reviewed- no acute abnormalities noted'  -T&S ordered, Mupirocin  ordered  -Pain management as per surgical team. Continue conservative symptom relief until operative management.     -Recommended Antibiotic prophylaxis: ancef  -Recommended VTE prophylaxis: per primary      Essential hypertension  -BP today: 130/85  -daily asa (stop one week prior to surgery)  -no BP meds     Hypertriglyceridemia  Mixed hyperlipidemia  Lab Results   Component Value Date    CHOLESTEROL 92 (L) 02/15/2024    HDLCHOL 34 (L) 02/15/2024    LDLCHOL 43 02/15/2024    TRIG 66 02/15/2024         -continue taking atorvastatin  40 mg      Metabolic dysfunction-associated steatotic liver disease (MASLD)   Latest Reference Range & Units 02/09/24 16:00   TOTAL PROTEIN 5.6 - 7.6 g/dL  5.6 - 7.6 g/dL 6.1  6.1   ALBUMIN 3.4 - 4.8 g/dL  3.4 - 4.8 g/dL 3.9  3.9   BILIRUBIN, TOTAL 0.3 - 1.3 mg/dL  0.3 - 1.3 mg/dL 1.5 (H)  1.5 (H)   AST (SGOT) 11 - 34 U/L  11 - 34 U/L 12  12   ALT (SGPT) <43 U/L  <43 U/L 13  13   ALKALINE PHOSPHATASE 45 - 115 U/L  45 - 115 U/L 84  84   (H): Data is abnormally high       Moderate persistent asthma without complication  -not in exacerbation  -continue  use of prn nebs and scheduled inhalers     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 Recommend scheduled antiemetic.    MSSA +       Orders Placed This Encounter    TYPE AND SCREEN    mupirocin  (BACTROBAN ) 2 % Ointment     The patient was provided with the following medication instructions prior to surgery, which is tentatively scheduled on 03/07/24:    14 days before surgery: hold over the counter supplements, vitamins, and multivitamins (Garlic, Gingko, St. John's Wart, Fish Oil, Omega 3 Fatty Acids). Hold starting 10/8    7 days before surgery: hold Aspirin  and any aspirin -containing products (Excedrin, Alka seltzer, Pepto Bismol, Goody's powder). Hold starting 10/15  You must hold your Ozempic , Wegovy , Victoza, or Mounjaro 7 days prior to surgery. This medication must be out of your body for >7+ days prior to your scheduled surgery date, so if your next injection falls within this time frame, do not take your medication. 10/15    7 days before surgery: hold NSAIDs/anti-inflammatories (Advil , Aleve  (naproxen ), Mobic (meloxicam), Voltaren (diclofenac ), Motrin  (ibuprofen ). Hold starting 10/15      Take the remainder of your medications as prescribed. Please notify the office of any medication changes (new medication started, medication stopped, or dose changes). Failure to notify the office of these medication changes can result in the cancellation of surgery.    Tylenol  is safe to take, including on the morning of  surgery.  _______________________________________________________________________________________________    AFTER SURGERY: It is normal for blood pressure to be lower than usual following surgery.    -If you take medications for blood pressure, please get a blood pressure cuff and check your blood pressures at home in the morning before you take your medications and again in the afternoon.  -If your blood pressure is consistently higher than 130 systolic (top number) for a few days then you can restart your blood pressure medications one at a time.  -If your blood pressure is higher than 160 systolic (top number) resume all your regular home blood pressure medicines.        Operative Reference     Pain contract on file with other provider: No      Patient history of DVT:    No      Recommended DVT ppx Per primary      Prophylactic antibiotics    Ancef      Patient history of    No thrombotic events      Sleep Apnea    No      Current Tobacco Use:     No      Current Drug Use:     No      Current Alcohol Use:     No      Fast Track    No   Allergy to the following: latex, metal/nickel, iodine , acrylic    No      Open wounds/rashes    No         History of chemo/radiation    No           Hx MRSA    No   History of a prosthetic joint infection/septic joint:  No      Any personal or family history of anesthesia complications?     Yes PONV      Last Steroid Injection Operative Site    N/a   Vulnerable population patient or hospitalization within 3 months?    No      Dental issues that need addressed prior to surgery:           No      Personal or family history of bleeding/clotting disorders:       No      History of pulmonary disorders:        No   A diagnosis of COVID-19/FLU in the past 90 days? No      Pacemaker, ICD, or implanted device?    No      History of MI/CVA/cardiac stents/cardiac surgeries:        No   Dietician Referral No  The following consultations have been obtained: [facility]    HISTORY OF  PRESENT ILLNESS:   Steve Young is a 63 y.o. male that presents with need for pre-operative history and physical.      The patient was seen in the [facility] clinic on 02/09/24 by myself. Since that visit, the patient denies any significant changes in their health, medications, or activity level. The patient has not been hospitalized, visited the ER or Urgent Care, or been prescribed any antibiotics or steroids since their last visit.    The patient denies being diagnosed with COVID-19 in the past 90 days.      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    Asthma     Asthma exacerbation 01/09/2023    Atherosclerosis of both carotid arteries 03/04/2020    Cervicalgia 03/21/2018    Chronic abdominal pain 10/04/2018    Chronic back pain 03/21/2018    Chronic chest pain 03/21/2018    Chronic insomnia     Constipation in male 09/13/2017    COVID 12/2022    COVID-19 04/28/2021    Degenerative cervical spinal stenosis 03/30/2023    Diverticulitis 01/09/2023    Elevated lactic acid level 01/09/2023    Esophageal reflux     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    Hepatic lesion 10/04/2018    Hyperlipidemia     Hypertension     Hypertriglyceridemia 05/15/2022    Hypokalemia 11/18/2020    Impingement syndrome of left shoulder 12/28/2017    Metabolic dysfunction-associated steatotic liver disease (MASLD) 10/04/2018    Mild anemia 02/07/2023    Mild persistent asthma without complication 03/21/2018    NAFL (nonalcoholic fatty liver)     Normal cardiac stress test 03/21/2018    Nosebleed     Obesity     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    Partial tear of common extensor tendon of elbow 12/13/2017    Pituitary tumor     Prediabetes 02/18/2020    Rib pain 10/04/2018    Sepsis 01/09/2023    Sexual dysfunction     Tear of talofibular ligament  05/30/2013    RIGHT    Type 2 diabetes mellitus with other specified complication, unspecified whether long term insulin  use (CMS HCC) 01/11/2023    Vitamin D  deficiency      Past Surgical History:   Procedure Laterality Date    COLONOSCOPY      ELBOW SURGERY Left 12/2017    HX OTHER  2004     SPINAL FUSION,ANT,EA ADNL LEVEL c3-c7    HX OTHER Right     right great toe    HX SHOULDER SURGERY Bilateral     NOSE SURGERY       Allergies[1]  Outpatient Medications Marked as Taking for the 02/28/24 encounter (Office Visit) with Billy Light, APRN, CNP   Medication Sig    albuterol  sulfate (PROVENTIL  OR VENTOLIN  OR PROAIR ) 90 mcg/actuation Inhalation oral inhaler Take 1-2 Puffs by inhalation Every 4 hours as needed    albuterol  sulfate (PROVENTIL ) 2.5 mg /3 mL (0.083 %) Inhalation nebulizer solution Take 3 mL (2.5 mg total) by nebulization Four times a day as needed for Wheezing Indications: asthma attack    amitriptyline (ELAVIL) 25 mg Oral Tablet Take 1 Tablet (25 mg total) by  mouth Every night Indications: difficulty sleeping, neuropathic pain    atorvastatin  (LIPITOR) 40 mg Oral Tablet Take 1 Tablet (40 mg total) by mouth Daily Indications: high cholesterol and high triglycerides, treatment to prevent a heart attack    budesonide -formoteroL  (SYMBICORT ) 160-4.5 mcg/actuation Inhalation oral inhaler Take 2 Puffs by inhalation Twice daily Indications: controller medication for asthma    gabapentin  (NEURONTIN ) 600 mg Oral Tablet Take 1 Tablet (600 mg total) by mouth Three times a day    multivitamin-iron-folic acid  (CENTRUM COMPLETE) 18-400 mg-mcg Oral Tablet Take 1 Tablet by mouth Daily Indications: treatment to prevent vitamin deficiency, vitamin deficiency    mupirocin  (BACTROBAN ) 2 % Ointment Apply topically Twice daily for 5 days    omeprazole  (PRILOSEC) 40 mg Oral Capsule, Delayed Release(E.C.) Take 1 Capsule (40 mg total) by mouth Daily Indications: gastroesophageal reflux disease     Social History[2]  Family  Medical History:       Problem Relation (Age of Onset)    Cancer Mother, Sister    Gout Father    Heart Disease Mother, Darin Lincoln              Exam:  BP 130/85   Pulse 95   Ht 1.753 m (5' 9)   Wt 87.6 kg (193 lb 3.2 oz)   SpO2 97%   BMI 28.53 kg/m       Body mass index is 28.53 kg/m.      Physical Exam:  Today's Physical Exam:  Constitutional: well appearing, no acute distress. AOx3  Eyes: PERRL, EOM intact, no exudate   ENT: ENMT without erythema, mucous membranes moist.  Neck: supple, thyroid  non-enlarged and non-tender  Respiratory: Clear, without wheezes, rales, or rhonchi  Cardiovascular: RRR, no murmur  Gastrointestinal: BS+, non-tender  Musculoskeletal: Head atraumatic and normocephalic  Integumentary:  Warm and dry, no edema noted-no lesions noted  Neurologic: AO, CN 2-12 normal  Lymphatic/Immunologic/Hematologic: No lymphadenopathy  Psychiatric: memory intact, normal judgement and insight, normal mood and affect      Diagnostics:  BMP   Lab Results   Component Value Date    SODIUM 142 02/09/2024    SODIUM 142 02/09/2024    POTASSIUM 3.6 02/09/2024    POTASSIUM 3.7 02/09/2024    CHLORIDE 110 02/09/2024    CHLORIDE 110 02/09/2024    CO2 26 02/09/2024    CO2 25 02/09/2024    BUN 10 02/09/2024    BUN 10 02/09/2024    CREATININE 0.94 02/09/2024    CREATININE 0.96 02/09/2024    GLUCOSENF 107 (H) 04/04/2021    GLUCOSEFAST 114 03/05/2019    ANIONGAP 6 02/09/2024    ANIONGAP 7 02/09/2024    BUNCRRATIO 11 02/09/2024    BUNCRRATIO 10 02/09/2024    GFR >90 02/09/2024    GFR 88 02/09/2024        LFTs   Lab Results   Component Value Date    AST 12 02/09/2024    AST 12 02/09/2024    ALT 13 02/09/2024    ALT 13 02/09/2024    ALKPHOS 84 02/09/2024    ALKPHOS 84 02/09/2024    TOTBILIRUBIN 1.5 (H) 02/09/2024    TOTBILIRUBIN 1.5 (H) 02/09/2024    TOTALPROTEIN 6.1 02/09/2024    TOTALPROTEIN 6.1 02/09/2024    ALBUMIN 3.9 02/09/2024    ALBUMIN 3.9 02/09/2024   No results for input(s): PREALBUMIN, LIPASE,  UROBILINOGEN, GAMMAGT, LDH, AMYLASE, AMMONIA in the last 72 hours.     CBC   Lab Results  Component Value Date    WBC 7.1 02/09/2024    HGB 14.5 02/09/2024    HCT 41.7 02/09/2024    PLTCNT 228 02/09/2024      Diff   Lab Results   Component Value Date    PMNS 58.0 01/27/2023    MONOCYTES 8.0 01/27/2023    BASOPHILS 1.0 01/27/2023    BASOPHILS <0.10 01/27/2023    PMNABS 4.52 01/27/2023    LYMPHSABS 2.46 01/27/2023    EOSABS 0.18 01/27/2023    MONOSABS 0.64 01/27/2023        Coagulation Studies   Lab Results   Component Value Date    PROTHROMTME 10.2 02/09/2024    INR 0.98 02/09/2024    APTT 25.3 01/08/2023         Inflammatory markers   No results found for: AESR, CREAPROINFLA     Recent Results (from the past 720 hours)   ECG 12 LEAD    Collection Time: 02/09/24  3:52 PM   Result Value Ref Range    Ventricular rate 71 BPM    Atrial Rate 71 BPM    PR Interval 224 ms    QRS Duration 94 ms    QT Interval 382 ms    QTC Calculation 415 ms    Calculated P Axis 57 degrees    Calculated R Axis 43 degrees    Calculated T Axis 33 degrees       Revised Goldman Cardiac Risk Index:  Of the six independent predictors of major cardiac complications, patient exhibits the following:  None           Total Points: 0    Rate of cardiac death, nonfatal myocardial infarction, and nonfatal cardiac arrest  No risk factors - 0.4 percent (95%CI 0.1-0.8 percent)  This represents the percentage risk of major cardiac complications (cardiac death, non-fatal MI, non-fatal cardiac arrest, post-operative cardiogenic pulmonary edema, and/or complete heart block).    ASA: ASA 2 - Patient with mild systemic disease with no functional limitations    METs: < 4 METs - Walking slower than 4 mph    NSQIP:  NSQIP Risk Calculator        Time spent on encounter: 20 minutes       Surgeon comments related to surgery may be added after this signature for additional information and surgical plans.    Andree Senters, APRN, CNP  02/28/2024,  10:23  Orthopaedic Medical Optimization Program  Dept of Orthopaedics               [1] No Known Allergies  [2]   Social History  Tobacco Use    Smoking status: Never    Smokeless tobacco: Never   Vaping Use    Vaping status: Never Used   Substance Use Topics    Alcohol use: Not Currently    Drug use: Never

## 2024-02-16 NOTE — Progress Notes (Signed)
 PRIMARY CARE, [facility]  [address] [place] [address]           Name: Steve Young  MRN: [id number]  Date of Birth: [birth year]    Encounter Date: 02/16/2024    Chief Complaint   Patient presents with    Follow Up     Hypertension, diabetes, DDD, PTSD, insomnia, hyperlipidemia, hypokalemia, muscle spasm     Pain     Still having right hand pain    Results     Last office visit with the provider:11/16/2023     SUBJECTIVE:  Steve Young is a 63 y.o. male,White.  He is scheduled for neck surgery on 03/07/2024 and does not want to get flu shot today. He gets a lot of spasms mostly in his neck and states the Flexeril  is not helping.  She used to be on Ambien  for sleep. He does not have the following symptoms today: fever, chills, cough, nasal congestion, sore throat, abdominal pain, headache, dizziness, eye pain or irritation, epistaxis, chest discomfort, stroke symptoms, shortness of breath, dyspnea on exertion or palpitations. Side effects of medications: no current medication side effects.     Current Outpatient Medications   Medication Sig    albuterol  sulfate (PROVENTIL  OR VENTOLIN  OR PROAIR ) 90 mcg/actuation Inhalation oral inhaler Take 1-2 Puffs by inhalation Every 4 hours as needed    albuterol  sulfate (PROVENTIL ) 2.5 mg /3 mL (0.083 %) Inhalation nebulizer solution Take 3 mL (2.5 mg total) by nebulization Four times a day as needed for Wheezing Indications: asthma attack    aspirin  (ECOTRIN) 81 mg Oral Tablet, Delayed Release (E.C.) Take 1 Tablet (81 mg total) by mouth Daily Indications: treatment to prevent a heart attack    atorvastatin  (LIPITOR) 40 mg Oral Tablet Take 1 Tablet (40 mg total) by mouth Daily Indications: high cholesterol and high triglycerides, treatment to prevent a heart attack    budesonide -formoteroL  (SYMBICORT ) 160-4.5 mcg/actuation Inhalation oral inhaler Take 2 Puffs by inhalation Twice daily Indications: controller medication for asthma     cyclobenzaprine  (FLEXERIL ) 10 mg Oral Tablet Take 1 Tablet (10 mg total) by mouth Every 8 hours as needed for Muscle spasms Indications: disorder characterized by stiff, tender & painful muscles, muscle spasm    gabapentin  (NEURONTIN ) 600 mg Oral Tablet Take 1 Tablet (600 mg total) by mouth Three times a day    Ibuprofen  (MOTRIN ) 800 mg Oral Tablet Take 1 Tablet (800 mg total) by mouth Every 8 hours as needed for Pain Indications: joint damage causing pain and loss of function, pain    omeprazole  (PRILOSEC) 40 mg Oral Capsule, Delayed Release(E.C.) Take 1 Capsule (40 mg total) by mouth Daily Indications: gastroesophageal reflux disease    potassium chloride  (MICRO K) 10 mEq Oral Capsule, Sustained Release Take 1 Capsule (10 mEq total) by mouth Twice daily Indications: low amount of potassium in the blood, prevention of low potassium in the blood    semaglutide  (OZEMPIC ) 1 mg/dose (4 mg/3 mL) Subcutaneous Pen Injector Inject 1 mg under the skin Every 7 days Indications: type 2 diabetes mellitus, weight loss management for a person with obesity      Allergies[1]    Past Surgical History:   Procedure Laterality Date    COLONOSCOPY      ELBOW SURGERY Left 12/2017    HX OTHER  2004     SPINAL FUSION,ANT,EA ADNL LEVEL c3-c7    HX OTHER Right     right great toe  HX SHOULDER SURGERY Bilateral     NOSE SURGERY       Social History[2]    OBJECTIVE:   The patient appears to be in no acute distress.  Vitals: BP 120/76 (Site: Left Arm, Patient Position: Sitting, Cuff Size: Adult Large)   Pulse 84   Temp 36.2 C (97.2 F) (Temporal)   Resp 16   Ht 1.753 m (5' 9)   Wt 87.5 kg (193 lb)   SpO2 97%   BMI 28.50 kg/m   General: alert, no acute distress  Head: normocephalic, atraumatic  Eye: EOM intact, normal conjunctiva  Respiratory: Good diaphragmatic excursion. Lungs clear.  Heart: RRR  Extremities: Ambulatory, no edema  Neurologic: Awake, alert, oriented, speech clear and coherent  Skin: No jaundice, warm   Psychiatric:  Cooperative, normal judgment, not suicidal or homicidal    ASSESSMENT AND PLAN:     ICD-10-CM    1. Essential hypertension  I10 MAGNESIUM   Blood pressure is at goal. Continue to monitor blood pressure.      2. Type 2 diabetes mellitus with hyperlipidemia (CMS HCC)  (CMS HCC)  E11.69 External Referral to OPHTHAMOLOGIST/OPTOMETRIST    E78.5 MAGNESIUM   Improved. Continue well-balanced ADA meals, Ozempic , atorvastatin , checking feet regularly, regular exercise as tolerated, weight management, control of blood pressure and cholesterol, regular eye checks.      3. DDD (degenerative disc disease), cervical  M50.30 MAGNESIUM   Worsening pain. Follow-up with orthopedics.      4. Primary osteoarthritis of right hand  M19.041 MAGNESIUM   She may continue aspirin  and advised to see Orthopedics.      5. PTSD (post-traumatic stress disorder)  F43.10 amitriptyline  (ELAVIL ) 25 mg Oral Tablet  Stable.  Added Elavil  to help chronic pain and insomnia.      6. Psychophysiological insomnia  F51.04 amitriptyline  (ELAVIL ) 25 mg Oral Tablet - new  He may try Elavil  at night.  Will increase dose of Elavil  of the current dose is not effective and tolerated.      7. Neuropathic pain  M79.2 amitriptyline  (ELAVIL ) 25 mg Oral Tablet     MAGNESIUM   His pain is not well controlled.  Continue gabapentin  and added Elavil .      8. Hypokalemia  E87.6 POTASSIUM     multivitamin-iron-folic acid  (CENTRUM COMPLETE) 18-400 mg-mcg Oral Tablet - new     MAGNESIUM   Improved.  Advised to eat fruits and vegetables regularly.  He may stop potassium chloride  and take daily multivitamin supplement to prevent deficiency.      9. Muscle spasm  M62.838 methocarbamoL  (ROBAXIN ) 750 mg Oral Tablet - new     MAGNESIUM   He may stop Flexeril  and try Robaxin  1 tablet 3 times a day as needed for pain or spasms.      10. Pain of right hand  M79.641 Refer to [facility]  He may continue ibuprofen  as needed for pain.        Importance of medication adherence  discussed.  Advised patient to fill medications regularly.  Current diabetes medications:   Active Diabetes Meds   Medication Sig    semaglutide  (OZEMPIC ) 1 mg/dose (4 mg/3 mL) Subcutaneous Pen Injector Inject 1 mg under the skin Every 7 days Indications: type 2 diabetes mellitus, weight loss management for a person with obesity     Data reviewed:   PROSTATE SPECIFIC ANTIGEN   Lab Results   Component Value Date/Time    PROSSPECAG 1.75 10/12/2023 01:29 PM        Lab  Results   Component Value Date/Time    HA1C 5.5 02/09/2024 04:00 PM    MICALBCRERAT 8.6 02/09/2024 04:00 PM    BUN 10 02/09/2024 04:00 PM    CREATININE 0.96 02/09/2024 04:00 PM    GFR 88 02/09/2024 04:00 PM    GLUCOSE 92 02/09/2024 04:00 PM    CALCIUM 9.0 02/09/2024 04:00 PM    SODIUM 142 02/09/2024 04:00 PM    POTASSIUM 3.6 02/09/2024 04:00 PM    WBC 7.1 02/09/2024 04:00 PM    HGB 14.5 02/09/2024 04:00 PM    HCT 41.7 02/09/2024 04:00 PM    PLTCNT 228 02/09/2024 04:00 PM    AST 12 02/09/2024 04:00 PM    ALT 13 02/09/2024 04:00 PM    ALKPHOS 84 02/09/2024 04:00 PM    CHOLESTEROL 92 (L) 02/15/2024 12:29 PM    HDLCHOL 34 (L) 02/15/2024 12:29 PM    LDLCHOL 43 02/15/2024 12:29 PM    TRIG 66 02/15/2024 12:29 PM    TSH 0.979 03/29/2023 10:48 AM    MAGNESIUM  2.1 10/12/2023 01:29 PM     Orders Placed This Encounter    POTASSIUM    MAGNESIUM     External Referral to OPHTHAMOLOGIST/OPTOMETRIST    Refer to [facility]    amitriptyline  (ELAVIL ) 25 mg Oral Tablet    methocarbamoL  (ROBAXIN ) 750 mg Oral Tablet    multivitamin-iron-folic acid  (CENTRUM COMPLETE) 18-400 mg-mcg Oral Tablet     Recheck blood work within a month. Above plan was discussed with the patient and patient verbalized understanding. Patient agreed with above treatment and plan. Questions answered to patient's satisfaction. Risks, benefits, and alternatives to above treatment discussed with patient.  Flu shot recommended this flu season. He can hold aspirin , ibuprofen  and Ozempic  a week  before surgery.  Follow-up in 2 months , sooner should new symptoms or problems arise.   He  was advised to contact the office if with any questions or concerns.    Vernell PARAS. Lucinda, MD  DEPT PHONE: [phone]  DEPT FAX: [phone]  Note: This chart was transcribed using voice recognition software and may contain unintended word substitution or minor typographical errors.         [1] No Known Allergies  [2]   Social History  Tobacco Use    Smoking status: Never    Smokeless tobacco: Never   Vaping Use    Vaping status: Never Used   Substance Use Topics    Alcohol use: Not Currently    Drug use: Never

## 2024-02-17 ENCOUNTER — Ambulatory Visit (HOSPITAL_COMMUNITY): Payer: Self-pay

## 2024-02-17 NOTE — Telephone Encounter (Signed)
 Left message for patient about referral for right hand pain. Advised patient to schedule with Dr. Kayla and to call our clinic back to get scheduled.

## 2024-02-19 ENCOUNTER — Other Ambulatory Visit (INDEPENDENT_AMBULATORY_CARE_PROVIDER_SITE_OTHER): Payer: Self-pay | Admitting: Family Medicine

## 2024-02-19 DIAGNOSIS — E1142 Type 2 diabetes mellitus with diabetic polyneuropathy: Secondary | ICD-10-CM

## 2024-02-19 DIAGNOSIS — J4540 Moderate persistent asthma, uncomplicated: Secondary | ICD-10-CM

## 2024-02-20 ENCOUNTER — Encounter (INDEPENDENT_AMBULATORY_CARE_PROVIDER_SITE_OTHER): Payer: Self-pay | Admitting: Family Medicine

## 2024-02-20 DIAGNOSIS — M4802 Spinal stenosis, cervical region: Secondary | ICD-10-CM

## 2024-02-20 DIAGNOSIS — Z981 Arthrodesis status: Secondary | ICD-10-CM

## 2024-02-20 DIAGNOSIS — M47816 Spondylosis without myelopathy or radiculopathy, lumbar region: Secondary | ICD-10-CM

## 2024-02-20 NOTE — Telephone Encounter (Signed)
 Last Visit:02/16/2024     Upcoming appointments: 04/20/2024           Cheyanna Strick, MA  02/20/2024, 08:27

## 2024-02-27 ENCOUNTER — Encounter (HOSPITAL_COMMUNITY): Payer: Self-pay | Admitting: ORTHOPAEDIC SURGERY

## 2024-02-27 DIAGNOSIS — M79641 Pain in right hand: Secondary | ICD-10-CM

## 2024-02-27 DIAGNOSIS — M160 Bilateral primary osteoarthritis of hip: Secondary | ICD-10-CM

## 2024-02-28 ENCOUNTER — Ambulatory Visit: Payer: Self-pay

## 2024-02-28 ENCOUNTER — Encounter (HOSPITAL_COMMUNITY): Payer: Self-pay

## 2024-02-28 ENCOUNTER — Other Ambulatory Visit: Payer: Self-pay

## 2024-02-28 VITALS — BP 130/85 | HR 95 | Ht 69.0 in | Wt 193.2 lb

## 2024-02-28 DIAGNOSIS — M4802 Spinal stenosis, cervical region: Secondary | ICD-10-CM

## 2024-02-28 DIAGNOSIS — E781 Pure hyperglyceridemia: Secondary | ICD-10-CM

## 2024-02-28 DIAGNOSIS — E782 Mixed hyperlipidemia: Secondary | ICD-10-CM

## 2024-02-28 DIAGNOSIS — K760 Fatty (change of) liver, not elsewhere classified: Secondary | ICD-10-CM

## 2024-02-28 DIAGNOSIS — I10 Essential (primary) hypertension: Secondary | ICD-10-CM

## 2024-02-28 DIAGNOSIS — Z01818 Encounter for other preprocedural examination: Secondary | ICD-10-CM | POA: Insufficient documentation

## 2024-02-28 MED ORDER — MUPIROCIN 2 % TOPICAL OINTMENT
TOPICAL_OINTMENT | Freq: Two times a day (BID) | CUTANEOUS | 0 refills | Status: DC
Start: 2024-02-28 — End: 2024-03-08

## 2024-03-01 ENCOUNTER — Other Ambulatory Visit: Payer: Self-pay

## 2024-03-01 ENCOUNTER — Ambulatory Visit: Payer: Self-pay

## 2024-03-01 VITALS — Ht 69.0 in | Wt 193.0 lb

## 2024-03-01 DIAGNOSIS — R2231 Localized swelling, mass and lump, right upper limb: Secondary | ICD-10-CM | POA: Insufficient documentation

## 2024-03-01 DIAGNOSIS — M65331 Trigger finger, right middle finger: Secondary | ICD-10-CM | POA: Insufficient documentation

## 2024-03-01 NOTE — Procedures (Signed)
 ORTHOPEDICS, [facility]  [address] [place] [address]  Operated by [facility]  Procedure Note    Name: Fares Ramthun MRN:  [id number]   Date: 03/01/2024 DOB:  [birth year] (63 y.o.)         Bedside ultrasound:     The palm of the right hand was examined with bedside ultrasound with the goal of better characterizing a subcutaneous mass overlying the 3rd ray.  A mass was identified and it is a heterogenous but slightly hypoechoic mass in his soft tissues superficial to the flexor tendon.  The flexor tendons are in continuity with no significant fluid around them.    Maude Barrio, MD

## 2024-03-01 NOTE — H&P (Signed)
 ORTHOPEDICS, [facility] [place]  [address] [place] [id number]  Operated by [facility]    Clinic Progress Note     Patient: Steve Young  DOB: 1960-12-11  MRN: [id number]    Date: 03/01/2024    CHIEF COMPLAINT:  Chief Complaint   Patient presents with    Hand Pain     right        HISTORY OF PRESENT ILLNESS:  63 year old male presenting for evaluation of a sensitive right palm mass.  It has been present for about a year.  It presented insidiously, no wounds, no trauma to the area.  When he is skin over the mass is very sensitive.  He denies any locking or catching in his fingers.  He has numbness and tingling that radiates into his hand, he is getting neck surgery next week with Dr. Feliciana.    He denies overlying skin changes to the mass.  No contractures in his hand.      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    Asthma     Asthma exacerbation 01/09/2023    Atherosclerosis of both carotid arteries 03/04/2020    Cervicalgia 03/21/2018    Chronic abdominal pain 10/04/2018    Chronic back pain 03/21/2018    Chronic chest pain 03/21/2018    Chronic insomnia     Constipation in male 09/13/2017    COVID 12/2022    COVID-19 04/28/2021    Degenerative cervical spinal stenosis 03/30/2023    Diverticulitis 01/09/2023    Elevated lactic acid level 01/09/2023    Esophageal reflux     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    Hepatic lesion 10/04/2018    Hyperlipidemia     Hypertension     Hypertriglyceridemia 05/15/2022    Hypokalemia 11/18/2020    Impingement syndrome of left shoulder 12/28/2017    Metabolic dysfunction-associated steatotic liver disease (MASLD) 10/04/2018    Mild anemia 02/07/2023    Mild persistent asthma without complication 03/21/2018    NAFL (nonalcoholic fatty liver)     Normal cardiac  stress test 03/21/2018    Nosebleed     Obesity     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    Partial tear of common extensor tendon of elbow 12/13/2017    Pituitary tumor     Prediabetes 02/18/2020    Rib pain 10/04/2018    Sepsis 01/09/2023    Sexual dysfunction     Tear of talofibular ligament 05/30/2013    RIGHT    Type 2 diabetes mellitus with other specified complication, unspecified whether long term insulin  use (CMS HCC) 01/11/2023    Vitamin D  deficiency             Past Surgical History:   Procedure Laterality Date    COLONOSCOPY      ELBOW SURGERY Left 12/2017    HX OTHER  2004     SPINAL FUSION,ANT,EA ADNL LEVEL c3-c7    HX OTHER Right     right great toe    HX SHOULDER SURGERY Bilateral     NOSE SURGERY             Family Medical History:       Problem Relation (Age of Onset)    Cancer Mother, Sister  Gout Father    Heart Disease Mother, Sister, Nephew               Current Medications[1]     Allergies[2]     PHYSICAL EXAM:  Ht 1.753 m (5' 9)   Wt 87.5 kg (193 lb)   BMI 28.50 kg/m         Right upper extremity:  Mild Dupuytren's in the palm with palpable cords and no contracture.  He has a subcutaneous, firm mass in line with a pretendinous cord in his middle finger that is tender to palpation.  This is just proximal to the A1 pulley and he has mild tenderness over the A1 pulley.  No significant nodularity of the underlying tendon and no inducible triggering or locking.  Sensation intact and symmetrical median/radial/ulnar.  Motor intact EPL, FPL, IO.  Mildly positive Tinel's at the carpal tunnel, negative at the cubital tunnel.  Able to make a full composite fist.  Fires EPL, FPL, IO.    TESTS REVIEWED:  EMG from January of 2025 reviewed, it does not show any peripheral nerve compression in the right arm    IMAGING:  X-rays of the right hand from 02/15/2024 personally reviewed.  It demonstrates no fractures or dislocations.  There is moderate thumb CMC arthritis with joint space narrowing and  osteophyte formation.    Bedside ultrasound of the right hand performed to evaluate subcutaneous mass.  There is a mildly heterogenous and hypoechoic mass just superficial to the flexor tendon of the middle finger.  It does not appear to be fluid filled.    ASSESSMENT:  63 year old male with a tender mass in his palm overlying his right 3rd ray.  This is in the context of mild Dupuytren's and possibly an underlying trigger finger.    PLAN:  We discussed his hand.  He has mild Dupuytren's in the hand and definitely has a mild pretendinous cord in his middle finger.  However, it is uncommon for Dupuytren's to be painful.  He also has some tenderness up near his A1 pulley so it is possible that his pain is coming from an underlying trigger finger.  He probably would benefit from a steroid injection into the area but seeing that he is about to get spine surgery, he would like to hold off and wait until after that for any injections.  This is very reasonable.    The patient was given the opportunity to ask questions. All of their questions were answered to their satisfaction. The patient is in agreement with our current treatment plan.     The patient will follow-up after his neck surgery in about 1 month.        Patient seen and examined by Dr. Kayla on 03/01/24 at 12:04.        Please note:  This note was transcribed using voice recognition software.  Because of this technology, there is often unintended grammatical, spelling and other transcription errors.  Please disregard these areas.         [1]   Current Outpatient Medications:     albuterol  sulfate (PROVENTIL  OR VENTOLIN  OR PROAIR ) 90 mcg/actuation Inhalation oral inhaler, Take 1-2 Puffs by inhalation Every 4 hours as needed, Disp: 6.7 g, Rfl: 3    albuterol  sulfate (PROVENTIL ) 2.5 mg /3 mL (0.083 %) Inhalation nebulizer solution, Take 3 mL (2.5 mg total) by nebulization Four times a day as needed for Wheezing Indications: asthma attack, Disp: 360 mL, Rfl: 3  amitriptyline (ELAVIL) 25 mg Oral Tablet, Take 1 Tablet (25 mg total) by mouth Every night Indications: difficulty sleeping, neuropathic pain, Disp: 30 Tablet, Rfl: 3    aspirin  (ECOTRIN) 81 mg Oral Tablet, Delayed Release (E.C.), Take 1 Tablet (81 mg total) by mouth Daily Indications: treatment to prevent a heart attack (Patient not taking: Reported on 02/28/2024), Disp: 90 Tablet, Rfl: 3    atorvastatin  (LIPITOR) 40 mg Oral Tablet, Take 1 Tablet (40 mg total) by mouth Daily Indications: high cholesterol and high triglycerides, treatment to prevent a heart attack, Disp: 90 Tablet, Rfl: 3    budesonide -formoteroL  (SYMBICORT ) 160-4.5 mcg/actuation Inhalation oral inhaler, Take 2 Puffs by inhalation Twice daily Indications: controller medication for asthma, Disp: 10.2 g, Rfl: 11    gabapentin  (NEURONTIN ) 600 mg Oral Tablet, Take 1 Tablet (600 mg total) by mouth Three times a day, Disp: 90 Tablet, Rfl: 3    Ibuprofen  (MOTRIN ) 800 mg Oral Tablet, Take 1 Tablet (800 mg total) by mouth Every 8 hours as needed for Pain Indications: joint damage causing pain and loss of function, pain (Patient not taking: Reported on 02/28/2024), Disp: 60 Tablet, Rfl: 3    methocarbamoL (ROBAXIN) 750 mg Oral Tablet, Take 1 Tablet (750 mg total) by mouth Three times a day as needed Indications: muscle spasm (Patient not taking: Reported on 02/28/2024), Disp: 30 Tablet, Rfl: 3    multivitamin-iron-folic acid  (CENTRUM COMPLETE) 18-400 mg-mcg Oral Tablet, Take 1 Tablet by mouth Daily Indications: treatment to prevent vitamin deficiency, vitamin deficiency, Disp: 90 Tablet, Rfl: 3    mupirocin  (BACTROBAN ) 2 % Ointment, Apply topically Twice daily for 5 days, Disp: 1 Each, Rfl: 0    omeprazole  (PRILOSEC) 40 mg Oral Capsule, Delayed Release(E.C.), Take 1 Capsule (40 mg total) by mouth Daily Indications: gastroesophageal reflux disease, Disp: 90 Capsule, Rfl: 3    semaglutide  (OZEMPIC ) 1 mg/dose (4 mg/3 mL) Subcutaneous Pen Injector, Inject 1 mg  under the skin Every 7 days Indications: type 2 diabetes mellitus, weight loss management for a person with obesity (Patient not taking: Reported on 02/28/2024), Disp: 9 mL, Rfl: 3  [2] No Known Allergies

## 2024-03-02 ENCOUNTER — Encounter (INDEPENDENT_AMBULATORY_CARE_PROVIDER_SITE_OTHER): Payer: Self-pay | Admitting: Family Medicine

## 2024-03-05 ENCOUNTER — Ambulatory Visit

## 2024-03-05 ENCOUNTER — Other Ambulatory Visit: Payer: Self-pay

## 2024-03-05 DIAGNOSIS — K219 Gastro-esophageal reflux disease without esophagitis: Secondary | ICD-10-CM | POA: Diagnosis present

## 2024-03-05 DIAGNOSIS — I10 Essential (primary) hypertension: Secondary | ICD-10-CM | POA: Insufficient documentation

## 2024-03-05 DIAGNOSIS — Z7985 Long-term (current) use of injectable non-insulin antidiabetic drugs: Secondary | ICD-10-CM

## 2024-03-05 DIAGNOSIS — Z7982 Long term (current) use of aspirin: Secondary | ICD-10-CM

## 2024-03-05 DIAGNOSIS — M5030 Other cervical disc degeneration, unspecified cervical region: Secondary | ICD-10-CM | POA: Insufficient documentation

## 2024-03-05 DIAGNOSIS — Z794 Long term (current) use of insulin: Secondary | ICD-10-CM

## 2024-03-05 DIAGNOSIS — E876 Hypokalemia: Secondary | ICD-10-CM | POA: Insufficient documentation

## 2024-03-05 DIAGNOSIS — Z79899 Other long term (current) drug therapy: Secondary | ICD-10-CM

## 2024-03-05 DIAGNOSIS — J4540 Moderate persistent asthma, uncomplicated: Secondary | ICD-10-CM | POA: Diagnosis present

## 2024-03-05 DIAGNOSIS — Z01818 Encounter for other preprocedural examination: Secondary | ICD-10-CM | POA: Insufficient documentation

## 2024-03-05 DIAGNOSIS — M62838 Other muscle spasm: Secondary | ICD-10-CM | POA: Insufficient documentation

## 2024-03-05 DIAGNOSIS — M4803 Spinal stenosis, cervicothoracic region: Principal | ICD-10-CM | POA: Diagnosis present

## 2024-03-05 DIAGNOSIS — E785 Hyperlipidemia, unspecified: Secondary | ICD-10-CM | POA: Insufficient documentation

## 2024-03-05 DIAGNOSIS — M19041 Primary osteoarthritis, right hand: Secondary | ICD-10-CM | POA: Insufficient documentation

## 2024-03-05 DIAGNOSIS — M47813 Spondylosis without myelopathy or radiculopathy, cervicothoracic region: Secondary | ICD-10-CM | POA: Diagnosis present

## 2024-03-05 DIAGNOSIS — K760 Fatty (change of) liver, not elsewhere classified: Secondary | ICD-10-CM | POA: Diagnosis present

## 2024-03-05 DIAGNOSIS — Z7951 Long term (current) use of inhaled steroids: Secondary | ICD-10-CM

## 2024-03-05 DIAGNOSIS — D72829 Elevated white blood cell count, unspecified: Secondary | ICD-10-CM | POA: Diagnosis present

## 2024-03-05 DIAGNOSIS — E782 Mixed hyperlipidemia: Secondary | ICD-10-CM | POA: Diagnosis present

## 2024-03-05 DIAGNOSIS — E1169 Type 2 diabetes mellitus with other specified complication: Secondary | ICD-10-CM | POA: Insufficient documentation

## 2024-03-05 DIAGNOSIS — M792 Neuralgia and neuritis, unspecified: Secondary | ICD-10-CM | POA: Insufficient documentation

## 2024-03-05 DIAGNOSIS — M5410 Radiculopathy, site unspecified: Secondary | ICD-10-CM | POA: Diagnosis present

## 2024-03-05 DIAGNOSIS — D649 Anemia, unspecified: Secondary | ICD-10-CM | POA: Diagnosis not present

## 2024-03-05 LAB — TYPE AND SCREEN
ABO/RH(D): A POS
ANTIBODY SCREEN: NEGATIVE

## 2024-03-05 LAB — POTASSIUM: POTASSIUM: 4.6 mmol/L (ref 3.5–5.1)

## 2024-03-05 LAB — MAGNESIUM: MAGNESIUM: 2.2 mg/dL (ref 1.8–2.6)

## 2024-03-07 ENCOUNTER — Other Ambulatory Visit: Payer: Self-pay

## 2024-03-07 ENCOUNTER — Encounter (HOSPITAL_COMMUNITY)

## 2024-03-07 ENCOUNTER — Encounter (HOSPITAL_COMMUNITY): Payer: Self-pay | Admitting: ORTHOPAEDIC SURGERY

## 2024-03-07 ENCOUNTER — Inpatient Hospital Stay
Admission: RE | Admit: 2024-03-07 | Discharge: 2024-03-12 | Disposition: A | Discharge status: Home Health | Source: Ambulatory Visit | Attending: ORTHOPAEDIC SURGERY | Admitting: ORTHOPAEDIC SURGERY

## 2024-03-07 ENCOUNTER — Observation Stay (HOSPITAL_COMMUNITY)

## 2024-03-07 ENCOUNTER — Encounter (HOSPITAL_COMMUNITY)
Admission: RE | Disposition: A | Discharge status: Home Health | Payer: Self-pay | Source: Ambulatory Visit | Attending: ORTHOPAEDIC SURGERY

## 2024-03-07 ENCOUNTER — Observation Stay (HOSPITAL_BASED_OUTPATIENT_CLINIC_OR_DEPARTMENT_OTHER)

## 2024-03-07 DIAGNOSIS — I10 Essential (primary) hypertension: Secondary | ICD-10-CM

## 2024-03-07 DIAGNOSIS — E785 Hyperlipidemia, unspecified: Secondary | ICD-10-CM

## 2024-03-07 DIAGNOSIS — E669 Obesity, unspecified: Secondary | ICD-10-CM

## 2024-03-07 DIAGNOSIS — M4802 Spinal stenosis, cervical region: Principal | ICD-10-CM | POA: Insufficient documentation

## 2024-03-07 DIAGNOSIS — K219 Gastro-esophageal reflux disease without esophagitis: Secondary | ICD-10-CM

## 2024-03-07 DIAGNOSIS — E119 Type 2 diabetes mellitus without complications: Secondary | ICD-10-CM

## 2024-03-07 DIAGNOSIS — M47813 Spondylosis without myelopathy or radiculopathy, cervicothoracic region: Secondary | ICD-10-CM

## 2024-03-07 DIAGNOSIS — Z981 Arthrodesis status: Secondary | ICD-10-CM | POA: Diagnosis present

## 2024-03-07 DIAGNOSIS — M47819 Spondylosis without myelopathy or radiculopathy, site unspecified: Secondary | ICD-10-CM

## 2024-03-07 DIAGNOSIS — M4803 Spinal stenosis, cervicothoracic region: Secondary | ICD-10-CM

## 2024-03-07 DIAGNOSIS — M47812 Spondylosis without myelopathy or radiculopathy, cervical region: Secondary | ICD-10-CM

## 2024-03-07 DIAGNOSIS — J4540 Moderate persistent asthma, uncomplicated: Secondary | ICD-10-CM

## 2024-03-07 LAB — CBC
HCT: 37.6 % — ABNORMAL LOW (ref 38.9–52.0)
HGB: 13 g/dL — ABNORMAL LOW (ref 13.4–17.5)
MCH: 29.4 pg (ref 26.0–32.0)
MCHC: 34.6 g/dL (ref 31.0–35.5)
MCV: 85.1 fL (ref 78.0–100.0)
MPV: 10.5 fL (ref 8.7–12.5)
PLATELETS: 185 x10ˆ3/uL (ref 150–400)
RBC: 4.42 x10ˆ6/uL — ABNORMAL LOW (ref 4.50–6.10)
RDW-CV: 13.1 % (ref 11.5–15.5)
WBC: 10.7 x10ˆ3/uL (ref 3.7–11.0)

## 2024-03-07 LAB — ABO & RH: ABO/RH(D): A POS

## 2024-03-07 LAB — PTT (PARTIAL THROMBOPLASTIN TIME): APTT: 24.2 s (ref 23.0–32.0)

## 2024-03-07 LAB — POC BLOOD GLUCOSE (RESULTS): GLUCOSE, POC: 136 mg/dL — ABNORMAL HIGH (ref 70–100)

## 2024-03-07 SURGERY — DECOMPRESSION SPINE CERVICAL POSTERIOR FUSION WITH INSTRUMENTATION 1 LEVEL
Anesthesia: General | Site: Spine Cervical | Wound class: Clean Wound: Uninfected operative wounds in which no inflammation occurred

## 2024-03-07 MED ORDER — PROPOFOL 10 MG/ML INTRAVENOUS EMULSION
INTRAVENOUS | Status: DC | PRN
Start: 2024-03-07 — End: 2024-03-07
  Administered 2024-03-07: 0 ug/kg/min via INTRAVENOUS
  Administered 2024-03-07: 100 ug/kg/min via INTRAVENOUS
  Administered 2024-03-07: 75 ug/kg/min via INTRAVENOUS
  Administered 2024-03-07: 85 ug/kg/min via INTRAVENOUS
  Administered 2024-03-07: 50 ug/kg/min via INTRAVENOUS
  Administered 2024-03-07: 85 ug/kg/min via INTRAVENOUS
  Administered 2024-03-07: 50 ug/kg/min via INTRAVENOUS
  Administered 2024-03-07: 90 ug/kg/min via INTRAVENOUS
  Administered 2024-03-07: 75 ug/kg/min via INTRAVENOUS
  Administered 2024-03-07: 90 ug/kg/min via INTRAVENOUS
  Administered 2024-03-07: 75 ug/kg/min via INTRAVENOUS

## 2024-03-07 MED ORDER — PANTOPRAZOLE 40 MG TABLET,DELAYED RELEASE
40.0000 mg | DELAYED_RELEASE_TABLET | Freq: Every day | ORAL | Status: DC
Start: 2024-03-07 — End: 2024-03-12
  Administered 2024-03-07 – 2024-03-12 (×6): 40 mg via ORAL
  Filled 2024-03-07 (×7): qty 1

## 2024-03-07 MED ORDER — ALBUTEROL SULFATE HFA 90 MCG/ACTUATION AEROSOL INHALER - RN
1.0000 | RESPIRATORY_TRACT | Status: DC | PRN
Start: 2024-03-07 — End: 2024-03-12
  Filled 2024-03-07: qty 8

## 2024-03-07 MED ORDER — GELATIN SPONGE,ABSORBABLE-PORCINE SKIN 100 TOPICAL SPONGE
VAGINAL_SPONGE | Freq: Once | CUTANEOUS | Status: DC | PRN
Start: 2024-03-07 — End: 2024-03-07
  Administered 2024-03-07: 1 via TOPICAL

## 2024-03-07 MED ORDER — LIDOCAINE-EPINEPHRINE 0.5 %-1:200,000 INJECTION SOLUTION
INTRAMUSCULAR | Status: AC
Start: 2024-03-07 — End: 2024-03-07
  Filled 2024-03-07: qty 50

## 2024-03-07 MED ORDER — VANCOMYCIN 1,000 MG INTRAVENOUS INJECTION
INTRAVENOUS | Status: AC
Start: 2024-03-07 — End: 2024-03-07
  Filled 2024-03-07: qty 10

## 2024-03-07 MED ORDER — HYDRALAZINE 25 MG TABLET
25.0000 mg | ORAL_TABLET | Freq: Four times a day (QID) | ORAL | Status: DC | PRN
Start: 2024-03-07 — End: 2024-03-08
  Filled 2024-03-07 (×4): qty 1

## 2024-03-07 MED ORDER — FENTANYL (PF) 50 MCG/ML INJECTION SOLUTION
Freq: Once | INTRAMUSCULAR | Status: DC | PRN
Start: 2024-03-07 — End: 2024-03-07
  Administered 2024-03-07 (×2): 50 ug via INTRAVENOUS

## 2024-03-07 MED ORDER — ALBUMIN, HUMAN 5 % INTRAVENOUS SOLUTION
INTRAVENOUS | Status: DC | PRN
Start: 2024-03-07 — End: 2024-03-07
  Administered 2024-03-07 (×2): 0 via INTRAVENOUS

## 2024-03-07 MED ORDER — MAGNESIUM HYDROXIDE 400 MG/5 ML ORAL SUSPENSION
15.0000 mL | Freq: Four times a day (QID) | ORAL | Status: DC | PRN
Start: 2024-03-07 — End: 2024-03-12

## 2024-03-07 MED ORDER — THROMBIN (RECOMBINANT) 5,000 UNIT TOPICAL SOLUTION
Freq: Once | CUTANEOUS | Status: DC | PRN
Start: 2024-03-07 — End: 2024-03-07
  Administered 2024-03-07: 2 via TOPICAL

## 2024-03-07 MED ORDER — KETAMINE 50 MG/5 ML (10 MG/ML) IN 0.9 % SODIUM CHLORIDE IV SYRINGE
INJECTION | Freq: Once | INTRAVENOUS | Status: DC | PRN
Start: 2024-03-07 — End: 2024-03-07
  Administered 2024-03-07: 30 mg via INTRAVENOUS
  Administered 2024-03-07 (×2): 10 mg via INTRAVENOUS

## 2024-03-07 MED ORDER — THROMBIN (RECOMBINANT) 5,000 UNIT TOPICAL SOLUTION
CUTANEOUS | Status: AC
Start: 2024-03-07 — End: 2024-03-07
  Filled 2024-03-07: qty 4

## 2024-03-07 MED ORDER — SUGAMMADEX 100 MG/ML INTRAVENOUS SOLUTION
Freq: Once | INTRAVENOUS | Status: DC | PRN
Start: 2024-03-07 — End: 2024-03-07
  Administered 2024-03-07: 400 mg via INTRAVENOUS

## 2024-03-07 MED ORDER — SODIUM CHLORIDE 0.9 % INTRAVENOUS SOLUTION
2.0000 g | Freq: Three times a day (TID) | INTRAVENOUS | Status: AC
Start: 2024-03-07 — End: 2024-03-08
  Administered 2024-03-07: 2 g via INTRAVENOUS
  Administered 2024-03-07: 0 g via INTRAVENOUS
  Administered 2024-03-08: 2 g via INTRAVENOUS
  Administered 2024-03-08: 0 g via INTRAVENOUS
  Filled 2024-03-07 (×2): qty 14.71

## 2024-03-07 MED ORDER — AMITRIPTYLINE 25 MG TABLET
25.0000 mg | ORAL_TABLET | Freq: Every evening | ORAL | Status: DC
Start: 2024-03-07 — End: 2024-03-12
  Administered 2024-03-07 – 2024-03-11 (×5): 25 mg via ORAL
  Filled 2024-03-07 (×6): qty 1

## 2024-03-07 MED ORDER — GABAPENTIN 300 MG CAPSULE
600.0000 mg | ORAL_CAPSULE | Freq: Three times a day (TID) | ORAL | Status: DC
Start: 2024-03-07 — End: 2024-03-11
  Administered 2024-03-07 – 2024-03-11 (×12): 600 mg via ORAL
  Filled 2024-03-07 (×13): qty 2

## 2024-03-07 MED ORDER — METHOCARBAMOL 750 MG TABLET
750.0000 mg | ORAL_TABLET | Freq: Three times a day (TID) | ORAL | Status: DC
Start: 2024-03-07 — End: 2024-03-08
  Administered 2024-03-07 – 2024-03-08 (×4): 750 mg via ORAL
  Filled 2024-03-07 (×5): qty 1

## 2024-03-07 MED ORDER — BUDESONIDE-FORMOTEROL HFA 160 MCG-4.5 MCG/ACTUATION AEROSOL INHALER - RN
2.0000 | Freq: Two times a day (BID) | Status: DC
Start: 2024-03-07 — End: 2024-03-12
  Administered 2024-03-07 – 2024-03-12 (×10): 2 via RESPIRATORY_TRACT
  Filled 2024-03-07: qty 6

## 2024-03-07 MED ORDER — ROCURONIUM 10 MG/ML INTRAVENOUS SOLUTION
Freq: Once | INTRAVENOUS | Status: DC | PRN
Start: 2024-03-07 — End: 2024-03-07
  Administered 2024-03-07 (×3): 50 mg via INTRAVENOUS

## 2024-03-07 MED ORDER — ALBUTEROL SULFATE 2.5 MG/3 ML (0.083 %) SOLUTION FOR NEBULIZATION
2.5000 mg | INHALATION_SOLUTION | Freq: Four times a day (QID) | RESPIRATORY_TRACT | Status: DC | PRN
Start: 2024-03-07 — End: 2024-03-12

## 2024-03-07 MED ORDER — SODIUM CHLORIDE 0.9 % INTRAVENOUS SOLUTION
2.0000 g | Freq: Once | INTRAVENOUS | Status: AC
Start: 2024-03-07 — End: 2024-03-07
  Administered 2024-03-07 (×2): 2 g via INTRAVENOUS
  Filled 2024-03-07: qty 14.71

## 2024-03-07 MED ORDER — SODIUM CHLORIDE 0.9 % (FLUSH) INJECTION SYRINGE
3.0000 mL | INJECTION | Freq: Three times a day (TID) | INTRAMUSCULAR | Status: DC
Start: 2024-03-07 — End: 2024-03-12
  Administered 2024-03-07 – 2024-03-08 (×2): 3 mL
  Administered 2024-03-08: 0 mL
  Administered 2024-03-08: 3 mL
  Administered 2024-03-09: 0 mL
  Administered 2024-03-09: 3 mL
  Administered 2024-03-09: 0 mL
  Administered 2024-03-10 (×2): 3 mL
  Administered 2024-03-10 – 2024-03-11 (×2): 0 mL
  Administered 2024-03-11: 3 mL
  Administered 2024-03-11 – 2024-03-12 (×3): 0 mL

## 2024-03-07 MED ORDER — HYDROMORPHONE 1 MG/ML INJECTION SYRINGE
1.0000 mg | INJECTION | INTRAMUSCULAR | Status: DC | PRN
Start: 2024-03-07 — End: 2024-03-12
  Administered 2024-03-07 – 2024-03-11 (×8): 1 mg via INTRAVENOUS
  Filled 2024-03-07 (×8): qty 1

## 2024-03-07 MED ORDER — ATORVASTATIN 40 MG TABLET
40.0000 mg | ORAL_TABLET | Freq: Every day | ORAL | Status: DC
Start: 2024-03-07 — End: 2024-03-12
  Administered 2024-03-07 – 2024-03-11 (×5): 40 mg via ORAL
  Filled 2024-03-07 (×6): qty 1

## 2024-03-07 MED ORDER — ACETAMINOPHEN 325 MG TABLET
650.0000 mg | ORAL_TABLET | ORAL | Status: DC | PRN
Start: 2024-03-07 — End: 2024-03-12
  Administered 2024-03-12: 650 mg via ORAL
  Filled 2024-03-07: qty 2

## 2024-03-07 MED ORDER — LIDOCAINE HCL 20 MG/ML (2 %) INJECTION SOLUTION
Freq: Once | INTRAMUSCULAR | Status: DC | PRN
Start: 2024-03-07 — End: 2024-03-07
  Administered 2024-03-07: 100 mg via INTRAVENOUS

## 2024-03-07 MED ORDER — ALBUTEROL SULFATE HFA 90 MCG/ACTUATION AEROSOL INHALER - RN
Freq: Once | RESPIRATORY_TRACT | Status: DC | PRN
Start: 2024-03-07 — End: 2024-03-07
  Administered 2024-03-07: 4 via RESPIRATORY_TRACT

## 2024-03-07 MED ORDER — HYDROMORPHONE 1 MG/ML INJECTION SYRINGE
INJECTION | Freq: Once | INTRAMUSCULAR | Status: DC | PRN
Start: 2024-03-07 — End: 2024-03-07
  Administered 2024-03-07 (×4): .5 mg via INTRAVENOUS

## 2024-03-07 MED ORDER — HYDROMORPHONE 0.5 MG/0.5 ML INJECTION SYRINGE
0.5000 mg | INJECTION | INTRAMUSCULAR | Status: DC | PRN
Start: 2024-03-07 — End: 2024-03-07
  Administered 2024-03-07 (×4): 0.5 mg via INTRAVENOUS
  Filled 2024-03-07 (×4): qty 0.5

## 2024-03-07 MED ORDER — LIDOCAINE-EPINEPHRINE 0.5 %-1:200,000 INJECTION SOLUTION
Freq: Once | INTRAMUSCULAR | Status: DC | PRN
Start: 2024-03-07 — End: 2024-03-07
  Administered 2024-03-07: 10 mL via INTRAMUSCULAR

## 2024-03-07 MED ORDER — GLYCOPYRROLATE 0.2 MG/ML INJECTION SOLUTION
Freq: Once | INTRAMUSCULAR | Status: DC | PRN
Start: 2024-03-07 — End: 2024-03-07
  Administered 2024-03-07: .1 mg via INTRAVENOUS

## 2024-03-07 MED ORDER — ONDANSETRON HCL (PF) 4 MG/2 ML INJECTION SOLUTION
Freq: Once | INTRAMUSCULAR | Status: DC | PRN
Start: 2024-03-07 — End: 2024-03-07
  Administered 2024-03-07: 4 mg via INTRAVENOUS

## 2024-03-07 MED ORDER — SODIUM CHLORIDE 0.9 % (FLUSH) INJECTION SYRINGE
3.0000 mL | INJECTION | INTRAMUSCULAR | Status: DC | PRN
Start: 2024-03-07 — End: 2024-03-12

## 2024-03-07 MED ORDER — SODIUM CHLORIDE 0.9 % INTRAVENOUS SOLUTION
INTRAVENOUS | Status: DC
Start: 2024-03-07 — End: 2024-03-08
  Administered 2024-03-08: 0 mL via INTRAVENOUS

## 2024-03-07 MED ORDER — HYDRALAZINE 10 MG TABLET
10.0000 mg | ORAL_TABLET | Freq: Four times a day (QID) | ORAL | Status: DC | PRN
Start: 2024-03-07 — End: 2024-03-07

## 2024-03-07 MED ORDER — SODIUM CHLORIDE 0.9 % (FLUSH) INJECTION SYRINGE
3.0000 mL | INJECTION | Freq: Three times a day (TID) | INTRAMUSCULAR | Status: DC
Start: 2024-03-07 — End: 2024-03-12
  Administered 2024-03-07: 3 mL
  Administered 2024-03-07: 0 mL
  Administered 2024-03-08: 3 mL
  Administered 2024-03-08: 0 mL
  Administered 2024-03-08 – 2024-03-09 (×2): 3 mL
  Administered 2024-03-09: 0 mL
  Administered 2024-03-09 – 2024-03-10 (×2): 3 mL
  Administered 2024-03-10: 0 mL
  Administered 2024-03-10: 3 mL
  Administered 2024-03-11 (×2): 0 mL
  Administered 2024-03-11: 3 mL
  Administered 2024-03-12 (×2): 0 mL

## 2024-03-07 MED ORDER — DEXAMETHASONE SODIUM PHOSPHATE 4 MG/ML INJECTION SOLUTION
Freq: Once | INTRAMUSCULAR | Status: DC | PRN
Start: 2024-03-07 — End: 2024-03-07
  Administered 2024-03-07: 4 mg via INTRAVENOUS
  Administered 2024-03-07: 8 mg via INTRAVENOUS

## 2024-03-07 MED ORDER — ACETAMINOPHEN 1,000 MG/100 ML (10 MG/ML) INTRAVENOUS SOLUTION
1000.0000 mg | Freq: Once | INTRAVENOUS | Status: AC
Start: 2024-03-07 — End: 2024-03-07
  Administered 2024-03-07: 1000 mg via INTRAVENOUS
  Administered 2024-03-07: 0 mg via INTRAVENOUS
  Filled 2024-03-07: qty 100

## 2024-03-07 MED ORDER — PROCHLORPERAZINE MALEATE 10 MG TABLET
10.0000 mg | ORAL_TABLET | Freq: Four times a day (QID) | ORAL | Status: DC | PRN
Start: 2024-03-07 — End: 2024-03-12
  Filled 2024-03-07: qty 1

## 2024-03-07 MED ORDER — LACTATED RINGERS INTRAVENOUS SOLUTION
INTRAVENOUS | Status: DC
Start: 2024-03-07 — End: 2024-03-08
  Administered 2024-03-07: 0 via INTRAVENOUS
  Administered 2024-03-07: 0 mL via INTRAVENOUS

## 2024-03-07 MED ORDER — OXYCODONE 5 MG TABLET
10.0000 mg | ORAL_TABLET | ORAL | Status: DC | PRN
Start: 2024-03-07 — End: 2024-03-08
  Administered 2024-03-07 – 2024-03-08 (×3): 10 mg via ORAL
  Filled 2024-03-07 (×3): qty 2

## 2024-03-07 MED ORDER — NALOXONE 0.4 MG/ML INJECTION SOLUTION
0.4000 mg | INTRAMUSCULAR | Status: DC | PRN
Start: 2024-03-07 — End: 2024-03-12

## 2024-03-07 MED ORDER — FAMOTIDINE 20 MG TABLET
20.0000 mg | ORAL_TABLET | Freq: Two times a day (BID) | ORAL | Status: DC | PRN
Start: 2024-03-07 — End: 2024-03-12
  Filled 2024-03-07: qty 1

## 2024-03-07 MED ORDER — PHENYLEPHRINE 0.8 MG/10 ML (80 MCG/ML) IN 0.9 %SOD.CHLORIDE IV SYRINGE
INJECTION | Freq: Once | INTRAVENOUS | Status: DC | PRN
Start: 2024-03-07 — End: 2024-03-07
  Administered 2024-03-07: 200 ug via INTRAVENOUS
  Administered 2024-03-07: 100 ug via INTRAVENOUS

## 2024-03-07 MED ORDER — MIDAZOLAM 1 MG/ML INJECTION WRAPPER
Freq: Once | INTRAMUSCULAR | Status: DC | PRN
Start: 2024-03-07 — End: 2024-03-07
  Administered 2024-03-07: 2 mg via INTRAVENOUS

## 2024-03-07 MED ORDER — PROPOFOL 10 MG/ML IV BOLUS
INJECTION | Freq: Once | INTRAVENOUS | Status: DC | PRN
Start: 2024-03-07 — End: 2024-03-07
  Administered 2024-03-07: 200 mg via INTRAVENOUS
  Administered 2024-03-07 (×3): 50 mg via INTRAVENOUS

## 2024-03-07 MED ORDER — LACTATED RINGERS INTRAVENOUS SOLUTION
INTRAVENOUS | Status: DC | PRN
Start: 2024-03-07 — End: 2024-03-07
  Administered 2024-03-07: 0 via INTRAVENOUS

## 2024-03-07 MED ORDER — HEPARIN (PORCINE) 5,000 UNIT/ML INJECTION SOLUTION
5000.0000 [IU] | Freq: Two times a day (BID) | INTRAMUSCULAR | Status: DC
Start: 2024-03-08 — End: 2024-03-12
  Administered 2024-03-08 – 2024-03-12 (×9): 5000 [IU] via SUBCUTANEOUS
  Filled 2024-03-07 (×10): qty 1

## 2024-03-07 MED ORDER — PROCHLORPERAZINE EDISYLATE 10 MG/2 ML (5 MG/ML) INJECTION SOLUTION
10.0000 mg | Freq: Four times a day (QID) | INTRAMUSCULAR | Status: DC | PRN
Start: 2024-03-07 — End: 2024-03-12

## 2024-03-07 MED ORDER — VANCOMYCIN 1,000 MG IV POWDER - FOR OR
Freq: Once | TOPICAL | Status: DC | PRN
Start: 2024-03-07 — End: 2024-03-07
  Administered 2024-03-07: 1 g via TOPICAL

## 2024-03-07 MED ORDER — OXYCODONE 5 MG TABLET
5.0000 mg | ORAL_TABLET | ORAL | Status: DC | PRN
Start: 2024-03-07 — End: 2024-03-08

## 2024-03-07 MED ORDER — DOCUSATE SODIUM 100 MG CAPSULE
100.0000 mg | ORAL_CAPSULE | Freq: Two times a day (BID) | ORAL | Status: DC | PRN
Start: 2024-03-07 — End: 2024-03-12

## 2024-03-07 MED ORDER — SODIUM CHLORIDE 0.9 % IRRIGATION SOLUTION
Freq: Once | Status: DC | PRN
Start: 2024-03-07 — End: 2024-03-07
  Administered 2024-03-07: 1000 mL

## 2024-03-07 MED ORDER — THROMBIN (RECOMBINANT) 5,000 UNIT TOPICAL SOLUTION
CUTANEOUS | Status: AC
Start: 2024-03-07 — End: 2024-03-07
  Filled 2024-03-07: qty 2

## 2024-03-07 MED ORDER — ONDANSETRON HCL (PF) 4 MG/2 ML INJECTION SOLUTION
4.0000 mg | Freq: Four times a day (QID) | INTRAMUSCULAR | Status: DC | PRN
Start: 2024-03-07 — End: 2024-03-12
  Administered 2024-03-07: 4 mg via INTRAVENOUS
  Filled 2024-03-07: qty 2

## 2024-03-07 SURGICAL SUPPLY — 42 items
APPL 70% ISPRP 2% CHG 26ML CHLRPRP HI-LT ORNG PREP STRL LF  DISP CLR (MED SURG SUPPLIES) ×2 IMPLANT
BIT DRILL 2.4MM STRL LF  DISP (SURGICAL CUTTING SUPPLIES) IMPLANT
BIT DRILL NAVIGATED INFNT OC UPR THOR SYS STRL LF  DISP (SURGICAL CUTTING SUPPLIES) IMPLANT
COVER EXTBL CASSETTE BIG (DRAPE/PACKS/SHEETS/OR TOWEL) ×1 IMPLANT
COVER RIGID STRL LF  LIGHT HNDL PLASTIC DISP GRN (MED SURG SUPPLIES) ×2 IMPLANT
DRAPE ADH STRP SM TWL MATTE FNSH 17X11IN STRDRP LF  STRL DISP SURG PE CLR (DRAPE/PACKS/SHEETS/OR TOWEL) ×4 IMPLANT
DRAPE TEAR RST LOW LINT 76X55IN LRG STRL SURG (DRAPE/PACKS/SHEETS/OR TOWEL) ×4 IMPLANT
DRAPE TOOL SMARTDRAPE EQUIP MED STRL DISP (DRAPE/PACKS/SHEETS/OR TOWEL) IMPLANT
DRESS ALG 8X2IN SLVRLN ANTIMIC LF  STRL (WOUND CARE SUPPLY) ×1 IMPLANT
EVAC LTWT LOW LEVEL SUCT STRL_LF DISP SURG WOUND DRAIN SIL (UROLOGICAL SUPPLIES) ×1 IMPLANT
FORCEPS ESURG LBRTY SCVLE GRNWD SS 1.5MM BAYONET 7 3/4IN 12FT BIPOLAR INSULATE CORD DISP (MED SURG SUPPLIES) ×1 IMPLANT
GOWN SURG 2XL XLNG LGTH L3 HKLP CLSR RGLN SLEEVE TWL STRL LF  DISP GRN AERO BLU PRFRM FBRC (DRAPE/PACKS/SHEETS/OR TOWEL) ×4 IMPLANT
GRAFT BONE MAGNETOS MATRIX .25-1MM SM ALLOGRAFT FLX (IMPLANTS GRAFT/TISSUE) IMPLANT
KIT BONE GRAFT ACCELERATE GRAFTON DEMINERALIZED CORTICAL FIBER 6 CC CANN (IMPLANTS TRAUMA) IMPLANT
NEEDLE SPINAL PNK 3.5IN 18GA QUINCKE REG WL POLYPROP QUINCKE TIP STRL LF  DISP (MED SURG SUPPLIES) IMPLANT
PACK SURG UNIV DRP I STRL DISP LF (CUSTOM TRAYS & PACK) ×1 IMPLANT
PEN SURG MRKNG SKIN RLR LRG RESERVOIR REG TIP LBL VIOL STRL LF  6IN (MED SURG SUPPLIES) ×1 IMPLANT
POWDER ABS SRGFM PORCINE GELTN 1GM (WOUND CARE SUPPLY) ×1 IMPLANT
ROD SPINAL 30MM 3.5MM PCUT (IMPLANTS SPINE) IMPLANT
SCREW BONE INFNT 3.5MM 20MM MA SPINE OC UPR THOR NONST LF (IMPLANTS SPINE) IMPLANT
SCREW BONE INFNT 3.5MM 22MM MA SPINE OC UPR THOR NONST LF (IMPLANTS SPINE) IMPLANT
SCREW BONE INFNT 4MM 22MM MA SPINE NONST LF (IMPLANTS SPINE) IMPLANT
SCREW BONE INFNT 4MM 24MM MA SPINE NONST LF (IMPLANTS SPINE) IMPLANT
SCREW SET INFNT SPINE OC UPR THOR M6 NONST LF (IMPLANTS SPINE) IMPLANT
SPONGE GAUZE 4X4IN COTTON 12 PLY LF  STRL DISP (WOUND CARE SUPPLY) ×2 IMPLANT
SPONGE GAUZE 4X4IN MDCHC COTTON 8P LF  STRL DISP WHT (WOUND CARE SUPPLY) ×2 IMPLANT
STAPLER SKIN 4.1X6.5MM 35 W STPL CART LF  APS U DISP CLR SS PLASTIC (WOUND CARE SUPPLY) ×1 IMPLANT
SUTURE 2-0 CT1 VICRYL 27IN UNDYED BRD COAT ABS (SUTURE/WOUND CLOSURE) ×1 IMPLANT
SUTURE 5MM BP-1 MERSILENE 12IN WHT 2 ARM NONAB (SUTURE/WOUND CLOSURE) ×1 IMPLANT
SUTURE ABSORBABLE VICRYL 1 MO-5 27IN CTRL REL BRAID 8 STRND COATED VIOLET (SUTURE/WOUND CLOSURE) ×2 IMPLANT
SUTURE SILK 2-0 FS PERMAHAND 18IN BLK BRD NONAB (SUTURE/WOUND CLOSURE) ×1 IMPLANT
SYRINGE LL 10ML LF  STRL GRAD N-PYRG DEHP-FR PVC FREE MED DISP (MED SURG SUPPLIES) ×1 IMPLANT
TAPE SURG CLOTH 4IN X 10YD_2864 12/CS (WOUND CARE SUPPLY) ×1 IMPLANT
TOOL 14CM MR8 3MM MTCH HEAD DISEC STRL DISP (SURGICAL CUTTING SUPPLIES) IMPLANT
TOOL 15CM MR8 2.2MM MTCH HEAD DISEC STRL DISP (SURGICAL CUTTING SUPPLIES) IMPLANT
TRAY CATH SNAPSEC SIL 16FR 10ML 1 LAYER FOLEY DRAIN BAG URINE METER LF (MED SURG SUPPLIES) ×1 IMPLANT
TRAY LAMINECTOMY ~~LOC~~ - ~~LOC~~ HOSPITAL (CUSTOM TRAYS & PACK) ×1 IMPLANT
TRAY STERILIZATION SPHERES (MED SURG SUPPLIES) IMPLANT
TUBING SUCT CLR 10FT .25IN MEDIVAC MXGR NCDTV MALE TO MALE CONN STRL LF  DISP (MED SURG SUPPLIES) ×1 IMPLANT
TUBING SUCT CLR 6FT .25IN MEDIVAC NCDTV PLASTIC MAXIGRIP MALE / MALE CONN STRL LF  DISP (MED SURG SUPPLIES) ×1 IMPLANT
WAX BONE 2.5G STRL NATURAL (WOUND CARE SUPPLY) ×1 IMPLANT
WOUND IRRG IRRISEPT DBRD CLNSG 0.05% CHG SYSTEM STRL LF (WOUND CARE SUPPLY) ×1 IMPLANT

## 2024-03-07 NOTE — Consults (Signed)
 [facility]  Internal Medicine Consultation  Date of Service:  03/07/2024  Steve, Young, 63 y.o. male  Date of Admission:  03/07/2024  Date of Birth:  03-Jan-1961    Reason for Consultation: Post operative medical management       Chief Complaint: C7-T1 severe facet arthropathy with severe right foraminal stenosis      History of Present Illness:  Steve Young is a 63 year old man with past medical history of hypertension, hyperlipidemia, MSLT, mild asthma who is admitted status post surgery by Dr Fraser for severe facet arthropathy and foraminal stenosis.  He was noting severe right arm radiculopathy in the C8 distribution, prompting the decision to go forward with surgery. The hospitalist team was consulted for medical management.     Patient tolerated the procedure well, although is having significant neck pain after the procedure.  Pain is currently a 10/10 despite receiving Dilaudid approximately 1 hour ago. He denies any new numbness or tingling. Prior to his procedure, he felt to be in his usual state of health and denies any recent illness, fevers, chills, nausea, vomiting, diarrhea, chest pain, dyspnea, cough, urinary complaints, headache and continues to deny these symptoms.     ROS  CONSTITUTIONAL: Patient denies any fatigue, weight loss, fever or chills.  EYES: Patient denies any double vision, blurred vision or loss of vision.  ENT: Patient denies any dizziness  CARDIOVASCULAR: Patient denies any heart racing.  RESPIRATORY: Patient denies any shortness of breath, noisy breathing, wheezing asthma or cough.   NEUROLOGICAL: Patient denies any numbness, tingling, seizures or headaches.  GI: Patient denies any nausea, vomiting, indigestion, or heartburn.   GU: Patient denies any increased urinary frequency or painful urination.   SKIN: Patient denies any rashes or lesions.   MUSCULOSKELETAL:  10/10 neck pain, no new weakness  HEM/LYMPH: Patient denies any bleeding or easy  bruising.  PSYCHIATRIC: Patient denies any anxiety or depression.      History:    Past Medical:    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    Asthma     Asthma exacerbation 01/09/2023    Atherosclerosis of both carotid arteries 03/04/2020    Cervicalgia 03/21/2018    Chronic abdominal pain 10/04/2018    Chronic back pain 03/21/2018    Chronic chest pain 03/21/2018    Chronic insomnia     Constipation in male 09/13/2017    COVID 12/2022    COVID-19 04/28/2021    Degenerative cervical spinal stenosis 03/30/2023    Diverticulitis 01/09/2023    Elevated lactic acid level 01/09/2023    Esophageal reflux     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    Hepatic lesion 10/04/2018    Hyperlipidemia     Hypertension     Hypertriglyceridemia 05/15/2022    Hypokalemia 11/18/2020    Impingement syndrome of left shoulder 12/28/2017    Metabolic dysfunction-associated steatotic liver disease (MASLD) 10/04/2018    Mild anemia 02/07/2023    Mild persistent asthma without complication 03/21/2018    NAFL (nonalcoholic fatty liver)     Normal cardiac stress test 03/21/2018    Nosebleed     Obesity     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    Partial tear of common extensor tendon of elbow 12/13/2017    Pituitary tumor  Prediabetes 02/18/2020    Rib pain 10/04/2018    Sepsis 01/09/2023    Sexual dysfunction     Tear of talofibular ligament 05/30/2013    RIGHT    Type 2 diabetes mellitus with other specified complication, unspecified whether long term insulin  use (CMS HCC) 01/11/2023    Vitamin D  deficiency       Past Surgical:    Past Surgical History:   Procedure Laterality Date    COLONOSCOPY      ELBOW SURGERY Left 12/2017    HX OTHER  2004     SPINAL FUSION,ANT,EA ADNL LEVEL c3-c7    HX OTHER Right     right great toe    HX SHOULDER SURGERY Bilateral     NOSE SURGERY         Family:    Family Medical History:       Problem Relation (Age of Onset)    Cancer Mother, Sister    Gout Father    Heart Disease Mother, Sister, Leonel           Social:   reports that he has never smoked. He has never used smokeless tobacco. He reports that he does not currently use alcohol. He reports that he does not use drugs.     Allergies[1]    Medications:  Medications Prior to Admission       Prescriptions    albuterol  sulfate (PROVENTIL  OR VENTOLIN  OR PROAIR ) 90 mcg/actuation Inhalation oral inhaler    Take 1-2 Puffs by inhalation Every 4 hours as needed    albuterol  sulfate (PROVENTIL ) 2.5 mg /3 mL (0.083 %) Inhalation nebulizer solution    Take 3 mL (2.5 mg total) by nebulization Four times a day as needed for Wheezing Indications: asthma attack    amitriptyline (ELAVIL) 25 mg Oral Tablet    Take 1 Tablet (25 mg total) by mouth Every night Indications: difficulty sleeping, neuropathic pain    aspirin  (ECOTRIN) 81 mg Oral Tablet, Delayed Release (E.C.)    Take 1 Tablet (81 mg total) by mouth Daily Indications: treatment to prevent a heart attack    Patient not taking:  No sig reported    atorvastatin  (LIPITOR) 40 mg Oral Tablet    Take 1 Tablet (40 mg total) by mouth Daily Indications: high cholesterol and high triglycerides, treatment to prevent a heart attack    budesonide -formoteroL  (SYMBICORT ) 160-4.5 mcg/actuation Inhalation oral inhaler    Take 2 Puffs by inhalation Twice daily Indications: controller medication for asthma    gabapentin  (NEURONTIN ) 600 mg Oral Tablet    Take 1 Tablet (600 mg total) by mouth Three times a day    Ibuprofen  (MOTRIN ) 800 mg Oral Tablet    Take 1 Tablet (800 mg total) by mouth Every 8 hours as needed for Pain Indications: joint damage causing pain and loss of function, pain    Patient taking differently:  Take 1 Tablet (800 mg total) by mouth Every 8 hours as needed for Pain    methocarbamoL (ROBAXIN) 750 mg Oral Tablet    Take 1 Tablet (750 mg total) by mouth Three  times a day as needed Indications: muscle spasm    Patient taking differently:  Take 1 Tablet (750 mg total) by mouth Three times a day as needed    multivitamin-iron-folic acid  (CENTRUM COMPLETE) 18-400 mg-mcg Oral Tablet    Take 1 Tablet by mouth Daily Indications: treatment to prevent vitamin deficiency, vitamin deficiency    mupirocin  (BACTROBAN ) 2 % Ointment  Apply topically Twice daily for 5 days    omeprazole  (PRILOSEC) 40 mg Oral Capsule, Delayed Release(E.C.)    Take 1 Capsule (40 mg total) by mouth Daily Indications: gastroesophageal reflux disease    semaglutide  (OZEMPIC ) 1 mg/dose (4 mg/3 mL) Subcutaneous Pen Injector    Inject 1 mg under the skin Every 7 days Indications: type 2 diabetes mellitus, weight loss management for a person with obesity    Patient not taking:  Reported on 02/28/2024          acetaminophen  (TYLENOL ) tablet, 650 mg, Oral, Q4H PRN  albuterol  (PROVENTIL ) 2.5 mg / 3 mL (0.083%) neb solution, 2.5 mg, Nebulization, 4x/day PRN  albuterol  90 mcg per inhalation oral inhaler - Nursing to administer, 1 Puff, Inhalation, Q4H PRN  amitriptyline (ELAVIL) tablet, 25 mg, Oral, NIGHTLY  atorvastatin  (LIPITOR) tablet, 40 mg, Oral, Daily  budesonide -formoterol  (SYMBICORT ) 160 mcg-4.5 mcg per inhalation oral inhaler - Nursing to administer, 2 Puff, Inhalation, 2x/day  ceFAZolin (ANCEF) 2 g in NS 100 mL IVPB with adaptor, 2 g, Intravenous, Q8H  docusate sodium (COLACE) capsule, 100 mg, Oral, 2x/day PRN  famotidine (PEPCID) tablet, 20 mg, Oral, 2x/day PRN  gabapentin  (NEURONTIN ) capsule, 600 mg, Oral, 3x/day  [START ON 03/08/2024] heparin 5,000 unit/mL injection, 5,000 Units, Subcutaneous, 2x/day  HYDROmorphone (DILAUDID) 1 mg/mL injection, 1 mg, Intravenous, Q2H PRN  LR premix infusion, , Intravenous, Continuous  magnesium hydroxide (MILK OF MAGNESIA) 400mg  per 5mL oral liquid, 15 mL, Oral, Q6H PRN  methocarbamol (ROBAXIN) tablet, 750 mg, Oral, Q8H  naloxone (NARCAN) 0.4 mg/mL injection, 0.4  mg, Intravenous, Q2 MIN PRN  NS flush syringe, 3 mL, Intracatheter, Q8HRS  NS flush syringe, 3 mL, Intracatheter, Q1H PRN  NS flush syringe, 3 mL, Intracatheter, Q8HRS  NS flush syringe, 3 mL, Intracatheter, Q1H PRN  NS premix infusion, , Intravenous, Continuous  ondansetron  (ZOFRAN ) 2 mg/mL injection, 4 mg, Intravenous, Q6H PRN  oxyCODONE  (ROXICODONE ) immediate release tablet, 5 mg, Oral, Q4H PRN  oxyCODONE  (ROXICODONE ) immediate release tablet, 10 mg, Oral, Q4H PRN  pantoprazole  (PROTONIX ) delayed release tablet, 40 mg, Oral, Daily  prochlorperazine (COMPAZINE) 5 mg/mL injection, 10 mg, Intravenous, Q6H PRN   Or  prochlorperazine (COMPAZINE) tablet, 10 mg, Oral, Q6H PRN        Physical Exam:    Temperature: 37 C (98.6 F) Heart Rate: 73 BP (Non-Invasive): (!) 158/92   Respiratory Rate: 17 SpO2: 98 %       Exam:  Nursing note and vitals reviewed.   General: No acute distress, alert and oriented x3. Wincing in pain with most body movement  HEENT: Pharynx without no erythema, exudate. PERRLA, conjunctivae are without injection or scleral icterus  Neck: Neck supple without lymphadenopathy. C collar in place  Breast: Deferred exam  Cardio: Regular rate and rhythm. Normal S1 and S2. No murmurs, gallops, or rubs. PMI located in the midclavicular line.   Resp: Clear to auscultation bilaterally. No wheezes, rales, rhonchi or crackles. Normal resp effort.  Abd: Bowel sounds present in all 4 quadrants.   GU: Deferred exam  Extremities: No edema or swelling noted. No gait disturbances or weakness. Muscle strength 5/5 in bilateral upper and lower extremities.  Skin: No rashes, ulcers, or bruising.  Neuro: No focal deficits. CN 2-12 intact.  Psych: Normal mood and affect. Labs:     Results for orders placed or performed during the hospital encounter of 03/07/24 (from the past 24 hours)   ABO & RH   Result Value Ref Range  ABO/RH(D) A POSITIVE    PTT (PARTIAL THROMBOPLASTIN TIME)   Result Value Ref Range    APTT 24.2 23.0 -  32.0 seconds   CBC   Result Value Ref Range    WBC 10.7 3.7 - 11.0 x10^3/uL    RBC 4.42 (L) 4.50 - 6.10 x10^6/uL    HGB 13.0 (L) 13.4 - 17.5 g/dL    HCT 62.3 (L) 61.0 - 52.0 %    MCV 85.1 78.0 - 100.0 fL    MCH 29.4 26.0 - 32.0 pg    MCHC 34.6 31.0 - 35.5 g/dL    RDW-CV 86.8 88.4 - 84.4 %    PLATELETS 185 150 - 400 x10^3/uL    MPV 10.5 8.7 - 12.5 fL       Imaging Studies:    Results for orders placed or performed during the hospital encounter of 03/07/24   XR SINGLE SPINE VIEW     Status: None    Narrative    Gaberiel TROY RITA  Male, 63 years old.    XR SINGLE SPINE VIEW performed on 03/07/2024 9:10 AM.    REASON FOR EXAM:  C7-T1 foraminotomy, C7-T1 foraminotomy     TECHNIQUE: 1 views/1 images submitted for interpretation.    COMPARISON: Cervical spine radiograph 02/15/2024      Impression    FINDINGS/IMPRESSION:  Single lateral intraoperative view of the cervical spine is submitted. There is redemonstration of anterior fusion hardware overlying C3, C4, and C5. There appears to be instrumentation posteriorly at the level of of C5-C6.          Radiologist location ID: [id number]     FLUORO O-ARM IN OR     Status: None    Narrative    *Procedure not read by radiology.    *Please Refer to Procedure Note for result.            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 Pain management and DVT ppx per primary     Essential HTN- Per hx. Not on BP meds. Currently having 10/10 pain. Will treat pain and monitor overnight. If remains high will intiate blood pressure regimen     HLD- Continue Atorvastatin  40 mg every evening      Moderate persistent asthma without complication-not in exacerbation.  Continue prn albuterol  and Symbicort      Type 2 diabetes mellitus with obesity- Last A1c: 5.5 on 9.25.25. Does not check his sugars at home. No longer on Metformin . He has been started on Ozempic , moreso for weight Loss. Will monitor with ACHS BG checks and add SSI if necessary.     Gastroesophageal reflux disease, unspecified whether esophagitis present- Continue Prilosec               DVT/PE Prophylaxis: Heparin   Code Status: Full       Alan Gibson Wreggelsworth, PA-C      I saw and examined the patient.  I reviewed the PA's note.  I agree with the findings and plan of care as documented in the PA's note.  Any exceptions/additions are edited/noted.     Karyle Balzarine, MD           [1]  No Known Allergies

## 2024-03-07 NOTE — Anesthesia Preprocedure Evaluation (Signed)
 ANESTHESIA PRE-OP EVALUATION  Planned Procedure: C7-T1 RIGHT FORAMINOTOMY W/POSTERIOR FUSION (Spine Cervical)  Review of Systems    PONV                 Pulmonary   asthma,   Cardiovascular    Hypertension and hyperlipidemia ,No peripheral edema,        GI/Hepatic/Renal    GERD and liver disease        Endo/Other    anemia, obesity and diverticulitis,      Neuro/Psych/MS        Cancer                        Physical Assessment      Airway       Mallampati: II    TM distance: 3 FB    Neck ROM: full  Mouth Opening: good.            Dental           (+) missing           Pulmonary    Breath sounds clear to auscultation  (-) no rhonchi, no decreased breath sounds, no wheezes, no rales and no stridor     Cardiovascular    Rhythm: regular  Rate: Normal  (-) no friction rub, carotid bruit is not present, no peripheral edema and no murmur     Other findings              Plan  ASA 3     Planned anesthesia type: general                         Intravenous induction       Anesthetic plan and risks discussed with patient

## 2024-03-07 NOTE — OR Surgeon (Signed)
 [facility]                                              SPINE OPERATIVE NOTE    Patient Name: Steve Young, Steve Young [facility] Number: [id number]  Date of Service: 03/07/2024   Date of Birth: 1961-02-05    Attending Surgeon: Camellia [name], MD    Assistants: Kristin Siemon PA-C was required for the entirety of this case as there was no Resident available to assist at time of surgery.  The physician assistant was present from beginning to end.  This was necessary for retraction, positioning and completion of the case in a safe and effective manner.    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  Post-Operative Diagnosis:  Same  Procedure(s)/Description:      1. C7-T1 bilateral posterolateral O arm navigated instrumented fusion.      2. C7-T1 right hemilaminectomy with medial facetectomy and foraminotomy.    3. Local autologous morselized bone graft.      4. Allograft bone graft with 6 cc of graft on putty and 1 cc of magnetos        Findings:  Severe right C7-T1 foraminal stenosis.    Patient understood the risks of surgery to include bleeding infection bowel or bladder dysfunction heart attack stroke numbness tingling weakness nerve damage spinal fluid leaks deep vein thrombosis PE paralysis paraplegia postoperative instability need for further surgery etcetera and wished to proceed.      TECHNIQUE:     After obtaining appropriate consent, patient was brought to the operating room.  General anesthesia was induced and SSEP monitoring was established it did not change from baseline through positioning or the procedure itself.    Patient was then placed prone on the Grubbs frame.  Care was taken to carefully pad all bony prominences.  Patient's arms were carefully padded and tucked at his sides.  Shoulders were taped distally to facilitate intraoperative x-rays.  Patient's posterior neck was prepped and draped in usual sterile fashion.  Patient's  skin was then injected with 10 cc of 0.5% lidocaine  1-200000 epinephrine solution.  A longitudinal incision was then made from the top of C6 to the bottom of T1.  Bovie cautery status down through soft tissues to the nuchal fascia which then split along the midline spinous processes and stripped out laterally to expose the posterior elements from C6-T1.  Intraoperative x-rays confirmed positioning.      The wound was copiously irrigated.  Deep self-retaining retractors were placed.  The O arm navigation marker was placed on the T1 spinous process in the O arm was brought in and spun.  Under O arm navigated guidance, a bur hole was made at the entrance of the pedicle of C7 and T1 bilaterally.  These were drilled again under O arm navigated guidance bilaterally and sounded with a ball-tip probe to confirm positioning within the pedicle at each level.  The screws on the left side were then placed after tapping the holes with a 3.5 x 22 mm screw at C7 and a 4.0 x 24 mm screw at T1.  The screws on the right side were not placed at this point but the holes were tapped.    Leksell rongeur was then used to remove the spinous process of C7 and a thin the underlying lamina centrally.  High-speed bur was then brought in along with the microscope to perform the hemilaminectomy and medial facetectomy and foraminotomy.  High-speed bur was used to thin the superior portion of the lamina of T1 in the inferior portion of lamina of C7 and then undercut with a Kerrison rongeur.  The lateral edge of the thecal sac was then identified.  Working from medial to lateral, high-speed bur was used to thin the facet joint over the C7-T1 foramen to the point where Kerrison rongeur to be used to completely decompress the exiting C8 nerve root.  A massive hypertrophied medial facet was significantly reduced in size using curette Kerrison rongeur and high-speed bur.  Once the root was completely decompressed, attention was then directed back to  the instrumentation.      On the right side then a 3.5 x 20 mm screw was placed at C7 and a 4.0 x 22 mm screw was placed at T1.  High-speed bur was then used to decorticate the dorsal elements at C7 and T1 and then rods were placed locking caps were placed and final tightened.  The wound was copiously irrigated with IrriSept and normal saline.  The arm was brought in and spun showing perfect positioning of all the instrumentation.    The wound was again irrigated.  Bipolar cautery was used for hemostasis.  A round JP drain was placed through a separate 15 blade stab incision.  A combination of local autologous bone graft denuded of soft tissue and morselized was mixed with 6 cc of graft on putty and 1 cc of magnetos and placed on the decorticated bone.  1 g of vancomycin powder was placed in the wound.    The wound was then closed in layers with 1. Vicryl simple interrupted sutures to tack muscle closed.  1. Vicryl figure-of-eight interrupted sutures for a watertight closure of the nuchal fascia.  2-0 Vicryl subcutaneously and skin staples to close skin.  Sterile dressings were placed.  Patient was then awakened and taken to recovery room in good condition.  Plan for the patient is mobilize weightbear as tolerated.  Discharge when independent.    Complications: None    Anesthesia Type:  @ANTYPEFROMLOG @  Estimated Blood Loss:  less than 100 ml   Drains:  Round JP             Disposition: to PACU  Condition:  Stable    Camellia [name], MD     This note has been generated by a word recognition computerized program. There may be grammatical, typographical, or word substitution errors which have escaped my editorial review.

## 2024-03-07 NOTE — Anesthesia Procedure Notes (Signed)
 Steve Young    PIV  Authorizing Provider:  Corbett Debby SAUNDERS, MD  Performing Provider:  Carlotta Kreg FORBES NORVA    Patient location: OR  Patient position: supine  Site Prep: alcohol  Personal Protective measures: gloves and handwashing  Infiltration: None  Angiocath  left and posterior 18 G  in the Dorsal Metacarpals  Number of attempts: 1    Secured by: transparent adhesive dressing

## 2024-03-07 NOTE — Nurses Notes (Signed)
 Dr requested PTT ordered STAT, placed order called lab for blood draw.

## 2024-03-07 NOTE — Anesthesia Postprocedure Evaluation (Signed)
 Anesthesia Post Op Evaluation    Patient: Steve Young  Procedure(s):  C7-T1 RIGHT FORAMINOTOMY W/POSTERIOR FUSION    Last Vitals:Temperature: (!) 35.6 C (96.1 F) (03/07/24 1236)  Heart Rate: 63 (03/07/24 1236)  BP (Non-Invasive): (!) 173/103 (03/07/24 1236)  Respiratory Rate: (!) 23 (03/07/24 1236)  SpO2: 99 % (03/07/24 1236)    No notable events documented.    Patient is sufficiently recovered from the effects of anesthesia to participate in the evaluation and has returned to their pre-procedure level.  Patient location during evaluation: PACU       Patient participation: complete - patient participated  Level of consciousness: awake and alert and responsive to verbal stimuli    Pain score: 1  Pain management: adequate  Airway patency: patent    Anesthetic complications: no  Cardiovascular status: acceptable  Respiratory status: acceptable  Hydration status: acceptable  Patient post-procedure temperature: Pt Normothermic   PONV Status: Absent

## 2024-03-07 NOTE — Anesthesia Transfer of Care (Signed)
 ANESTHESIA TRANSFER OF CARE   Steve Young is a 63 y.o. ,male, Weight: 86.2 kg (190 lb)   had Procedure(s):  C7-T1 RIGHT FORAMINOTOMY W/POSTERIOR FUSION  performed  03/07/24   Primary Service: Steve Young, *    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    Asthma     Asthma exacerbation 01/09/2023    Atherosclerosis of both carotid arteries 03/04/2020    Cervicalgia 03/21/2018    Chronic abdominal pain 10/04/2018    Chronic back pain 03/21/2018    Chronic chest pain 03/21/2018    Chronic insomnia     Constipation in male 09/13/2017    COVID 12/2022    COVID-19 04/28/2021    Degenerative cervical spinal stenosis 03/30/2023    Diverticulitis 01/09/2023    Elevated lactic acid level 01/09/2023    Esophageal reflux     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    Hepatic lesion 10/04/2018    Hyperlipidemia     Hypertension     Hypertriglyceridemia 05/15/2022    Hypokalemia 11/18/2020    Impingement syndrome of left shoulder 12/28/2017    Metabolic dysfunction-associated steatotic liver disease (MASLD) 10/04/2018    Mild anemia 02/07/2023    Mild persistent asthma without complication 03/21/2018    NAFL (nonalcoholic fatty liver)     Normal cardiac stress test 03/21/2018    Nosebleed     Obesity     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    Partial tear of common extensor tendon of elbow 12/13/2017    Pituitary tumor     Prediabetes 02/18/2020    Rib pain 10/04/2018    Sepsis 01/09/2023    Sexual dysfunction     Tear of talofibular ligament 05/30/2013    RIGHT    Type 2 diabetes mellitus with other specified complication, unspecified whether long term insulin  use (CMS HCC) 01/11/2023    Vitamin D  deficiency       Allergy History as of 03/07/24        No Known Allergies                  I completed my transfer of care / handoff to the receiving personnel  during which we discussed:  Access, Airway, All key/critical aspects of case discussed, Analgesia, Antibiotics, Expectation of post procedure, Fluids/Product, Gave opportunity for questions and acknowledgement of understanding, Labs and PMHx  Report given to: Steve Reusing, RN    Post Location: PACU                                                             Last OR Temp: Temperature: (!) 35.6 C (96.1 F)      Airway:* No LDAs found *  Blood pressure (!) 173/103, pulse 63, temperature (!) 35.6 C (96.1 F), resp. rate (!) 23, height 1.753 m (5' 9), weight 86.2 kg (190 lb), SpO2 99%.

## 2024-03-07 NOTE — Interval H&P Note (Signed)
 [facility]  H&P Update Form    Steve Young, Steve Young, 63 y.o. male  Encounter Start Date:  03/07/2024  Inpatient Admission Date: 03/07/2024  Date of Birth:  Apr 04, 1961    03/07/2024    STOP: IF H&P IS GREATER THAN 30 DAYS FROM SURGICAL DAY COMPLETE NEW H&P IS REQUIRED.     H & P updated the day of the procedure.  1.  H&P completed within 30 days of surgical procedure and has been reviewed within 24 hours of admission but prior to surgery or a procedure requiring anesthesia services by Dr. Fraser on the day of  the surgery, the patient has been examined, and no change has occured in the patients condition since the H&P was completed.       Change in medications: No                Comments:     2. COVID: Negative    3.  Patient continues to be appropiate candidate for planned surgical procedure. YES      [name], MD

## 2024-03-07 NOTE — Anesthesia Procedure Notes (Signed)
 Steve Young    Arterial Line Procedure      Consent:     Consent given by:  Patient    Risks discussed:  Bleeding, infection and nerve damage  Universal protocol:     Procedure explained and questions answered to patient or proxy's satisfaction: yes      Patient identity confirmed:  Verbally with patient  Pre-procedure details:     Preparation: Preprocedure hand washing was performed; sterile field was maintained         Skin Prep used: Chlorhexidine gluconate  Anesthesia (see MAR for exact dosages):     Anesthesia method:  Under general anesthesia    A 20 G Catheter type: Arrow 1 and 3/4 inch in length,  Placed on the left  radial artery  using anatomical landmarks, guidewire and palpation With  number of attempts:2.Secured with: transparent dressing   MEDICATIONS:     Post-procedure details:    Patient tolerance of procedure:  Tolerated well, no immediate complications Waveform appropriate, Correlates with cuff, Flush/aspirate well and Calibrated  Complications:none  Performed By:  Performing provider: Beth Goodlin E, SRNA Authorizing provider: Corbett Debby SAUNDERS, MD

## 2024-03-07 NOTE — Anesthesia Procedure Notes (Addendum)
 Steve Young    Airway Note  General Information and Staff   Authorizing provider: Corbett Debby SAUNDERS, MD  Performing provider: Carlotta Kreg FORBES NORVA        Reason: elective    Airway not difficult    Indications and Patient Condition  Pt location: In Or  Indications for airway management: anesthesia  Sedation level: deep      Preoxygenated: yes Patient position: neck neutral.     Mask difficulty assessment: 2 - vent by mask + OA or adjuvant +/- NMBA        Final Airway Details    Final airway type: endotracheal airway        Successful airway: ETT   Successful intubation technique: video laryngoscopy  Adjuncts used in placement: Bougie  Video scope brand: GlideScope      Type: Adult     ILMA Procedure :Standard blade  Endotracheal tube insertion site: oral  Blade: Macintosh  Blade size: #4  Airway size (mm): 7.5  Cormack-Lehane Classification: grade I - full view of glottis  Placement verified by: chest auscultation and capnometry   Marked at 24 and right  Measured from: lips  Secured with: Tape  Number of attempts at approach: 3 or more  Ventilation between attempts: torn cuff, ventilated with leak.  Number of other approaches attempted: 2Airway complications: Atraumatic  Additional Comments  First attempt easily placed ETT.  Grade I view.  Torn cuff noted due to poor dentition.  Maintained view with glidescope, replaced ETT.  Torn cuff again noted due to poor dentition.  Third attempt, maintained view with glidescope, removed ETT, placed bougie through cords.   Intubation successful over bougie.  Cuff maintained pressure.  No leak noted.  Oxygen  saturation 100% entire induction and intubation procedure.

## 2024-03-08 DIAGNOSIS — Z4789 Encounter for other orthopedic aftercare: Secondary | ICD-10-CM

## 2024-03-08 DIAGNOSIS — K760 Fatty (change of) liver, not elsewhere classified: Secondary | ICD-10-CM

## 2024-03-08 DIAGNOSIS — J45909 Unspecified asthma, uncomplicated: Secondary | ICD-10-CM

## 2024-03-08 DIAGNOSIS — Z967 Presence of other bone and tendon implants: Secondary | ICD-10-CM

## 2024-03-08 LAB — CBC WITH DIFF
BASOPHIL #: <0.1 x10ˆ3/uL (ref <=0.20)
BASOPHIL %: 0.2 %
EOSINOPHIL #: <0.1 x10ˆ3/uL (ref <=0.50)
EOSINOPHIL %: 0.1 %
HCT: 42.1 % (ref 38.9–52.0)
HGB: 14.3 g/dL (ref 13.4–17.5)
IMMATURE GRANULOCYTE #: <0.1 x10ˆ3/uL (ref <0.10)
IMMATURE GRANULOCYTE %: 0.5 % (ref 0.0–1.0)
LYMPHOCYTE #: 1.73 x10ˆ3/uL (ref 1.00–4.80)
LYMPHOCYTE %: 10 %
MCH: 29.3 pg (ref 26.0–32.0)
MCHC: 34 g/dL (ref 31.0–35.5)
MCV: 86.3 fL (ref 78.0–100.0)
MONOCYTE #: 0.73 x10ˆ3/uL (ref 0.20–1.10)
MONOCYTE %: 4.2 %
MPV: 11.2 fL (ref 8.7–12.5)
NEUTROPHIL #: 14.71 x10ˆ3/uL — ABNORMAL HIGH (ref 1.50–7.70)
NEUTROPHIL %: 85 %
PLATELETS: 246 x10ˆ3/uL (ref 150–400)
RBC: 4.88 x10ˆ6/uL (ref 4.50–6.10)
RDW-CV: 13.2 % (ref 11.5–15.5)
WBC: 17.3 x10ˆ3/uL — ABNORMAL HIGH (ref 3.7–11.0)

## 2024-03-08 LAB — BASIC METABOLIC PANEL
ANION GAP: 9 mmol/L (ref 4–13)
BUN/CREA RATIO: 12 (ref 6–22)
BUN: 12 mg/dL (ref 8–25)
CALCIUM: 9.9 mg/dL (ref 8.6–10.3)
CHLORIDE: 104 mmol/L (ref 96–111)
CO2 TOTAL: 31 mmol/L (ref 23–31)
CREATININE: 1.01 mg/dL (ref 0.75–1.35)
GLUCOSE: 143 mg/dL — ABNORMAL HIGH (ref 65–125)
POTASSIUM: 4.9 mmol/L (ref 3.5–5.1)
SODIUM: 144 mmol/L (ref 136–145)
eGFRcr - MALE: 83 mL/min/1.73mˆ2 (ref >=60)

## 2024-03-08 LAB — POC BLOOD GLUCOSE (RESULTS)
GLUCOSE, POC: 138 mg/dL — ABNORMAL HIGH (ref 70–100)
GLUCOSE, POC: 102 mg/dL — ABNORMAL HIGH (ref 70–100)
GLUCOSE, POC: 119 mg/dL — ABNORMAL HIGH (ref 70–100)
GLUCOSE, POC: 112 mg/dL — ABNORMAL HIGH (ref 70–100)

## 2024-03-08 MED ORDER — DEXTROSE 10 % IN WATER (D10W) BOLUS
250.0000 mL | INJECTION | INTRAVENOUS | Status: DC | PRN
Start: 2024-03-08 — End: 2024-03-12

## 2024-03-08 MED ORDER — DEXTROSE 40 % ORAL GEL
15.0000 g | ORAL | Status: DC | PRN
Start: 2024-03-08 — End: 2024-03-12

## 2024-03-08 MED ORDER — MELATONIN 3 MG TABLET
6.0000 mg | ORAL_TABLET | Freq: Every evening | ORAL | Status: DC
Start: 2024-03-08 — End: 2024-03-12
  Administered 2024-03-08 – 2024-03-11 (×4): 6 mg via ORAL
  Filled 2024-03-08 (×5): qty 2

## 2024-03-08 MED ORDER — GLUCAGON HCL 1 MG/ML SOLUTION FOR INJECTION
1.0000 mg | Freq: Once | INTRAMUSCULAR | Status: DC | PRN
Start: 2024-03-08 — End: 2024-03-12

## 2024-03-08 MED ORDER — CYCLOBENZAPRINE 10 MG TABLET
10.0000 mg | ORAL_TABLET | Freq: Three times a day (TID) | ORAL | Status: DC | PRN
Start: 2024-03-08 — End: 2024-03-12
  Administered 2024-03-09 (×2): 10 mg via ORAL
  Filled 2024-03-08 (×7): qty 1

## 2024-03-08 MED ORDER — INSULIN LISPRO 100 UNIT/ML SUB-Q SSIP PEN
2.0000 [IU] | INJECTION | Freq: Four times a day (QID) | SUBCUTANEOUS | Status: DC
Start: 2024-03-09 — End: 2024-03-12
  Administered 2024-03-09 (×2): 0 [IU] via SUBCUTANEOUS
  Administered 2024-03-09: 3 [IU] via SUBCUTANEOUS
  Administered 2024-03-09: 2 [IU] via SUBCUTANEOUS
  Administered 2024-03-10 (×2): 0 [IU] via SUBCUTANEOUS
  Administered 2024-03-10: 3 [IU] via SUBCUTANEOUS
  Administered 2024-03-10: 2 [IU] via SUBCUTANEOUS
  Administered 2024-03-11 (×2): 0 [IU] via SUBCUTANEOUS
  Administered 2024-03-11: 2 [IU] via SUBCUTANEOUS
  Administered 2024-03-11 – 2024-03-12 (×2): 0 [IU] via SUBCUTANEOUS
  Administered 2024-03-12: 3 [IU] via SUBCUTANEOUS
  Administered 2024-03-12: 0 [IU] via SUBCUTANEOUS
  Filled 2024-03-08: qty 300

## 2024-03-08 MED ORDER — OXYCODONE 5 MG TABLET
15.0000 mg | ORAL_TABLET | ORAL | Status: DC | PRN
Start: 2024-03-08 — End: 2024-03-12
  Administered 2024-03-08 – 2024-03-12 (×12): 15 mg via ORAL
  Filled 2024-03-08 (×12): qty 3

## 2024-03-08 MED ORDER — DEXTROSE 50 % IN WATER (D50W) INTRAVENOUS SYRINGE
12.5000 g | INJECTION | INTRAVENOUS | Status: DC | PRN
Start: 2024-03-08 — End: 2024-03-12

## 2024-03-08 MED ORDER — POLYETHYLENE GLYCOL 3350 17 GRAM ORAL POWDER PACKET
17.0000 g | Freq: Every day | ORAL | Status: DC
Start: 2024-03-09 — End: 2024-03-12
  Administered 2024-03-09 – 2024-03-10 (×2): 0 g via ORAL
  Administered 2024-03-11 – 2024-03-12 (×2): 17 g via ORAL
  Filled 2024-03-08 (×4): qty 1

## 2024-03-08 MED ORDER — OXYCODONE 5 MG TABLET
10.0000 mg | ORAL_TABLET | ORAL | Status: DC | PRN
Start: 2024-03-08 — End: 2024-03-12
  Administered 2024-03-10 – 2024-03-11 (×2): 10 mg via ORAL
  Filled 2024-03-08 (×2): qty 2

## 2024-03-08 NOTE — Care Management Notes (Signed)
 [facility]  Care Management Note    Patient Name: Steve Young  Date of Birth: Jun 22, 1960  Sex: male  Date/Time of Admission: 03/07/2024  6:06 AM  Room/Bed: 1402/A  Payor: [place] MEDICARE / Plan: [place] MEDICARE DUAL COMPLETE / Product Type: MEDICARE [facility] /    LOS: 1 day   Primary Care Providers:  Lucinda Millman, MD, MD (General)    Admitting Diagnosis:  Cervical stenosis of spinal canal [M48.02]  S/P cervical spinal fusion [Z98.1]    Assessment:      03/08/24 1527   Assessment Details   Date of Care Management Update 03/08/24   Medicare Intent to Discharge Documentation   Admit IMM given to: Patient   Admit IMM letter given date 03/08/24   Admit IMM letter time given 1520   IMM explained/reviewed with:  Patient;verbalized understanding         Discharge Plan:  Home vs home with Home Health  Pt upgraded. Admit IMM given.    The patient will continue to be evaluated for developing discharge needs.     Case Manager: Madelin Ka, RN  Phone: [phone]

## 2024-03-08 NOTE — PT Evaluation (Signed)
 [facility]  Physical Therapy Evaluation    Patient: Steve Young  MR Number: [id number]  Date of Birth: 08/12/1960  Admitting Diagnosis: Cervical stenosis of spinal canal [M48.02]  S/P cervical spinal fusion [Z98.1]    REHAB SESSION    Total PT Minutes: 45    Patient Effort: Adequate    Symptoms Noted During/After Session: fatigue, pain    Pretreatment Position: Patient seated in bedside chair/wheelchair    Post Treatment Position: Patient seated in bedside chair/wheelchair, Call light within reach and Patient safety alarm activated        GENERAL INFORMATION    History of Present Illness: Pt is a 63 y.o. male who presented to [facility] on 03/07/24 with the diagnosis of C7-T1 R foraminotomy with posterior fusion performed by Dr. Fraser on 03/07/24.       Pertinent PMH: COVID-19, Osteoarthritis, AKI, cervicalgia, cervical spinal stenosis, Gilbert's syndrome    Medical Lines: JP drain                       Respiratory Status: Room air    Precautions/Restrictions: FULL CODE: ATTEMPT RESUSCITATION/CPR, Fall risk, Spinal precautions: Logroll only, no twisting or bending, no lifting over 10 lbs, Brace on at all times: Aspen cervical collar     LIVING ENVIRONMENT     Living Situation: uncle, patient cares for this uncle    Home Set-up: One story plus     Steps:      Exterior: 1 step(s) with  no handrails         FUNCTIONAL LEVEL PRIOR    Home Equipment: none      Equipment Brought to Hospital: none    Ambulation: Independent with no assistive device     Transferring: Independent     ADLs: Independent     IADLs: Independent      COGNITIVE ASSESSMENT    Orientation Status: Alert and Oriented    Behavior/Mood Observations: Cooperative    Attention: Distractable    Follows Commands: Physical/tactile prompts required and Verbal cues/prompting required     REHAB ASSESSMENT     Skin integrity: Not Assessed    Vital signs: N/A    Numeric pain scale: 10 /10 back pain        AROM: Strength:   RUE WFL WFL   LUE WFL  WFL   RLE WFL Impaired   LLE WFL WFL      Comment:  R hip and DF 3+/5, PF 4/5, knee 4-/5                                            Bed Mobility:      Assistive devices:  none      Supine - Sit:  Not tested      Sit - Supine: Not tested    Transfers:      Assistive devices used with transfers: wheeled walker           Sit - stand:  Contact guard      Bed - Chair/ Chair - bed:  Not tested    Gait:        Ambulation distance:  60 feet       Level of assistance: Minimal assistance      Assistive device: wheeled walker      Deviations: decreased gait speed, decreased step height, decreased  step length, and unsteady      Comments: decreased sensation/proprioception R LE    Balance:      Balance while sitting         STATIC: Fair+            DYNAMIC: Fair       Balance while standing         STATIC: Fair-           DYNAMIC: Poor+    CLINICAL IMPRESSION     Assessment: Spinal precautions were reviewed. Pt lives with his uncle and was I with mobility PTA. Pt checks on his uncle who lives in the bottom level of the home. Today pt required CGA to Min A x 1 for mobility which is limited by neck pain. Pt reports decreased sensation R LE from knee down to his foot which appears to limited mobility but he reports is not new.        Initial Modified Rankin Scale: NA     Functional impairments/problem list: balance impaired, coordination impaired, endurance decreased, flexibility decreased , mobility impaired, motor control impaired , motor function impaired, neuromuscular impairment , pain, ROM decreased , safety awareness decreased, sensation decreased, and strength decreased      Rehab potential: Good          Anticipated Equipment Needs at Discharge:  [place] Finder     Anticipated Discharge Disposition: Home w/ Home Health or Inpatient rehab facility pending progress     PLAN OF CARE    Plan of Care Reviewed With: Patient    Physical Therapy Long Term Goals:      Bed Mobility: Modified independent       Transfer:  Modified independent       Ambulation: Modified independent  with wheeled walker 150 feet       Dynamic Balance: Fair +       Stair Negotiation: Modified independent         Number of Stairs: 1        Device for Stairs: wheeled walker    Therapy frequency: minimum of 3x/week    Anticipated time to achieve goals: 2-4 weeks    Planned Interventions: Balance training, Bed mobility training, Gait training, Motor coordination training, Neuromuscular re-education, Orthotic training, Pain management, Patient/Caregiver education, ROM, Safety Education, Stair negotiation, Print production planner , Stretching, Therapeutic exercises, Transfer training, and Endurance Activites     Gaetana Alert, PT

## 2024-03-08 NOTE — OT Evaluation (Signed)
 [facility]  Occupational Therapy Evaluation     Patient: Steve Young   MR Number: [id number]   Date of Birth: 08/20/1960   Admitting Diagnosis: Cervical stenosis of spinal canal [M48.02]  S/P cervical spinal fusion [Z98.1]    REHAB SESSION    Total OT Minutes: 45    Patient Effort: Adequate    Symptoms Noted During/After Session: fatigue, pain    Pretreatment Position: Patient seated in bedside chair/wheelchair    Post Treatment Position: Patient seated in bedside chair/wheelchair, Call light within reach and Patient safety alarm activated        GENERAL INFORMATION    History of Present Illness: Pt is a 63 y.o. male who presented to [facility] on 03/07/24 with the diagnosis of C7-T1 R foraminotomy with posterior fusion performed by Dr. Fraser on 03/07/24.      Pertinent PMH: COVID-19, Osteoarthritis, AKI, cervicalgia, cervical spinal stenosis, Gilbert's syndrome    Medical Lines: JP drain       Respiratory Status: Room air    Precautions/Restrictions: FULL CODE: ATTEMPT RESUSCITATION/CPR, Fall risk, Spinal precautions: Logroll only, no twisting or bending, no lifting over 10 lbs, Brace on at all times: Aspen cervical collar    LIVING ENVIRONMENT     Living Situation: uncle, patient cares for this uncle    Home Set-up: One story    Steps:      Exterior: 1 step(s) with  no handrails         FUNCTIONAL LEVEL PRIOR    Home Equipment: none      Equipment Brought to Hospital: none    Ambulation: Independent with no assistive device     Transferring: Independent     ADLs: Independent     IADLs: Independent        COGNITIVE ASSESSMENT    Orientation Status: Alert and Oriented    Behavior/Mood Observations: Cooperative    Attention: Distractable    Follows Commands: Physical/tactile prompts required and Verbal cues/prompting required    REHAB ASSESSMENT     Skin integrity: Not Assessed    Vital signs: N/A    Numeric pain scale: 10 /10 back pain       AROM: Strength:   RUE WFL WFL   LUE WFL WFL   RLE WFL  Impaired   LLE WFL WFL              Bed Mobility:      Assistive devices:  none      Supine - Sit:  Not tested      Sit - Supine: Not tested    Transfers:      Assistive devices used with transfers: wheeled walker       Sit - stand:  Contact guard      Bed - Chair/ Chair - bed:  Not tested    ADLs:      Feeding: Not assessed      Grooming: Not assessed      Bathing: Not assessed      Dressing:         UB: Not assessed         LB: Supervision using adaptive equipment      Toileting: Not assessed    Balance:      Balance while sitting          STATIC:  Fair+             DYNAMIC: Fair       Balance while standing  STATIC: Fair-            DYNAMIC: Fair-    CLINICAL IMPRESSION    Assessment: This patient lives with his uncle.  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  This therapist issued the patient adaptive equipment for LE ADLS to follow spine precautions. The patient demonstrated good understanding of the use of this equipment.    Functional impairments/problem list: balance impaired, endurance decreased, flexibility decreased , and mobility impaired    Rehab potential: Good         Anticipated Equipment Needs at Discharge:  To Be Determined    Anticipated Discharge Disposition: Home w/ Home Health or Inpatient rehab facility    PLAN OF CARE    Plan of Care Reviewed With: Patient    Occupational Therapy Long Term Goals:      Bed Mobility: Modified independent       Transfers: Modified independent       ADLs:         UE dressing: Modified independent        LE dressing: Modified independent        Bathing: Modified independent        Toileting: Modified independent          Therapy frequency: minimum of 3x/week    Anticipated time to achieve goals: <1 week    Planned Interventions: ADL retraining, balance training, bed mobility training, endurance training, transfer training        Mliss DELENA Senters, OT

## 2024-03-08 NOTE — Care Plan (Addendum)
 PT/OT eval completed.  Recommending discharge to Home w/ Home Health or Inpatient rehab facility pending progress.    Full PT/OT Eval notes to follow.  Luiz Blare, PT

## 2024-03-08 NOTE — Care Management Notes (Signed)
 [facility]  Care Management Initial Evaluation    Patient Name: Steve Young  Date of Birth: [birth year]  Sex: male  Date/Time of Admission: 03/07/2024  6:06 AM  Room/Bed: 1402/A  Payor: [place] MEDICARE / Plan: [place] MEDICARE DUAL COMPLETE / Product Type: MEDICARE MC /   Primary Care Providers:  Lucinda Millman, MD, MD (General)    Pharmacy Info:   Preferred Pharmacy       [facility] - [place], [place] - [address]    [address] [place] [address]    Phone: [phone] Fax: [phone]    Hours: Not open 24 hours    [facility] - [place], [place] - [address]    [address] [place] [address]    Phone: [phone] Fax: [phone]    Hours: Not open 24 hours          Emergency Contact Info:   Extended Emergency Contact Information  Primary Emergency Contact: [facility]  Home Phone: [phone]  Work Phone: [phone]  Mobile Phone: [phone]  Relation: Friend  Preferred language: English  Interpreter needed? No    History:   Steve Young is a 63 y.o., male, admitted cervical stenosis of spinal cord    Height/Weight: 175.3 cm (5' 9.02) / 90.7 kg (199 lb 15.3 oz)     LOS: 1 day   Admitting Diagnosis: Cervical stenosis of spinal canal [M48.02]  S/P cervical spinal fusion [Z98.1]    Assessment:      03/08/24 0857   Assessment Details   Assessment Type Admission   Date of Care Management Update 03/08/24   Date of Next DCP Update 03/09/24   Readmission   Is this a readmission? No   Insurance Information/Type   Insurance type Medicare   Employment/Financial   Patient has Prescription Coverage?  Yes        Name of Insurance Coverage for Medications [place] [employer]   Financial/Environmental Concerns none   Living Environment   Select an age group to open lives with row.  Adult   Lives With other relative(s) (specify)  (Uncle)   Living Arrangements house   Able to Return to Prior Arrangements yes   Home Safety   Home Accessibility no  concerns;stairs to enter home   Care Management Plan   Discharge Planning Status initial meeting   Projected Discharge Date 03/09/24   Discharge plan discussed with: Patient   CM will evaluate for rehabilitation potential yes   Discharge Needs Assessment   Equipment Currently Used at Home none   Equipment Needed After Discharge other (see comments)  (tbd)   Discharge Facility/Level of Care Needs Home vs Home with Home Health   Transportation Available family or friend will provide   Referral Information   Admission Type observation   Address Verified verified-no changes   Arrived From home or self-care   Observation Form   Observation Form Given MOON form given   MOON form explained/reviewed with:  Patient;verbalized understanding   MOON form date of delivery 03/08/24   MOON form time of delivery 0840   ADVANCE DIRECTIVES   Does the Patient have an Advance Directive? No, Information Offered and Refused         Discharge Plan:  Home vs home with Home Health  Steve Young admitted with cervical stenosis of spinal cord. Assessment completed at bedside with pt. Reviewed Care Managers role with discharge planning.   Verified address phone and contacts.  Pt lives in a  one level home with his uncle. There are 2 steps to enter the home and 0 steps within the home. Transportation is family. Confirmed PCP, pharmacy and insurance. Uses [facility]. Dr. Talaman is PCP. Pt has prescription coverage. Pt has no DME. Pt is not active with HH services. There is no  Chief Technology Officer on file.       MOON reviewed with pt. Verbalized understanding. Copy at bedside.      The patient will continue to be evaluated for developing discharge needs.     Case Manager: Madelin Ka, RN  Phone: [phone]

## 2024-03-08 NOTE — Consults (Signed)
 [facility]  Internal Medicine Consultation  Date of Service:  03/08/2024  Kellis, Mcadam, 63 y.o. male  Date of Admission:  03/07/2024  Date of Birth:  [birth year]    Reason for Consultation: Perioperative Medical Management    Assessment and Plan:  This is a 12 yr M patient who presented to [facility] on [facility]/22 for planned spine surgery. Significant PMH Cervical Spine Stenosis, T2DM, HTN, HLD, MASLD, Asthma, GERD, Vitamin D  Deficiency. Patient underwent C7-T Posterior Decompression and Fusion on 10/22 without complications. Anticipate patient to be medically ready for discharge in 2-3 days if meeting postoperative goals. PT/OT recommend Home Health vs IRU.    Cervical Spine Stenosis s/p C7-T Posterior Decompression and Fusion on 10/22  -further management per primary team  -PT/OT consult  -continue Elavil qhs and Gabapentin . Hold home Ibuprofen .    Leukocytosis  -most likely reactive to above  -afebrile  -monitor CBC    Asthma  -stable on RA  -continue home inhaler  -aggressive pulmonary toilet, nebs PRN    T2DM  MASLD  -hold home Ozempic   -conservative SSI  -diabetic diet    HTN  -patient not currently on any antihypertensive medications    HLD  -continue Lipitor    GERD  -continue PPI    Hold home Aspirin     DVT Prophylaxis: per primary team  Full Code  Disposition: TBD    Subjective:    Patient complaining of postoperative incisional neck pain today otherwise feels well.        acetaminophen  (TYLENOL ) tablet, 650 mg, Oral, Q4H PRN  albuterol  (PROVENTIL ) 2.5 mg / 3 mL (0.083%) neb solution, 2.5 mg, Nebulization, 4x/day PRN  albuterol  90 mcg per inhalation oral inhaler - Nursing to administer, 1 Puff, Inhalation, Q4H PRN  amitriptyline (ELAVIL) tablet, 25 mg, Oral, NIGHTLY  atorvastatin  (LIPITOR) tablet, 40 mg, Oral, Daily  budesonide -formoterol  (SYMBICORT ) 160 mcg-4.5 mcg per inhalation oral inhaler - Nursing to administer, 2 Puff, Inhalation, 2x/day  cyclobenzaprine  (FLEXERIL ) tablet, 10  mg, Oral, Q8H PRN  docusate sodium (COLACE) capsule, 100 mg, Oral, 2x/day PRN  famotidine (PEPCID) tablet, 20 mg, Oral, 2x/day PRN  gabapentin  (NEURONTIN ) capsule, 600 mg, Oral, 3x/day  heparin 5,000 unit/mL injection, 5,000 Units, Subcutaneous, 2x/day  hydrALAZINE (APRESOLINE) tablet, 25 mg, Oral, Q6H PRN  HYDROmorphone (DILAUDID) 1 mg/mL injection, 1 mg, Intravenous, Q2H PRN  LR premix infusion, , Intravenous, Continuous  magnesium hydroxide (MILK OF MAGNESIA) 400mg  per 5mL oral liquid, 15 mL, Oral, Q6H PRN  melatonin tablet, 6 mg, Oral, NIGHTLY  naloxone (NARCAN) 0.4 mg/mL injection, 0.4 mg, Intravenous, Q2 MIN PRN  NS flush syringe, 3 mL, Intracatheter, Q8HRS  NS flush syringe, 3 mL, Intracatheter, Q1H PRN  NS flush syringe, 3 mL, Intracatheter, Q8HRS  NS flush syringe, 3 mL, Intracatheter, Q1H PRN  NS premix infusion, , Intravenous, Continuous  ondansetron  (ZOFRAN ) 2 mg/mL injection, 4 mg, Intravenous, Q6H PRN  oxyCODONE  (ROXICODONE ) immediate release tablet, 15 mg, Oral, Q4H PRN  oxyCODONE  (ROXICODONE ) immediate release tablet, 10 mg, Oral, Q4H PRN  pantoprazole  (PROTONIX ) delayed release tablet, 40 mg, Oral, Daily  prochlorperazine (COMPAZINE) 5 mg/mL injection, 10 mg, Intravenous, Q6H PRN   Or  prochlorperazine (COMPAZINE) tablet, 10 mg, Oral, Q6H PRN      Physical Exam:    Temperature: 37.1 C (98.8 F) Heart Rate: 73 BP (Non-Invasive): (!) 143/88   Respiratory Rate: 17 SpO2: 92 %       Exam:  Nursing note and vitals reviewed.   General: 62  y.o. male, appears stated age, in no acute distress  Eyes: PERRLA. EOM intact. Clear conjunctivae.  HENT: NCAT. Oral and nasal mucosa moist without erythema or exudate.   Neck: +C Collar and JP drain  CV: Regular rate and rhythm without murmurs  Lungs: Clear to auscultation bilaterally. No wheezes, crackles or rhonchi. Comfortable work of breathing on RA.  Abdomen: Soft, non-tender, non-distended, bowel sounds present  Neuro: Alert and answers questions appropriately. No  apparent focal neurological deficits.  Musculoskeletal: Grossly normal limb movements without obvious deformity  Extremities: No peripheral edema  Skin:Warm and dry. No visible rashes or lesions.   Psych: Behavior, speech and affect are appropriate.     Labs:     Results for orders placed or performed during the hospital encounter of 03/07/24 (from the past 24 hours)   POC BLOOD GLUCOSE (RESULTS)   Result Value Ref Range    GLUCOSE, POC 138 (H) 70 - 100 mg/dl   BASIC METABOLIC PANEL   Result Value Ref Range    SODIUM 144 136 - 145 mmol/L    POTASSIUM 4.9 3.5 - 5.1 mmol/L    CHLORIDE 104 96 - 111 mmol/L    CO2 TOTAL 31 23 - 31 mmol/L    ANION GAP 9 4 - 13 mmol/L    CALCIUM 9.9 8.6 - 10.3 mg/dL    GLUCOSE 856 (H) 65 - 125 mg/dL    BUN 12 8 - 25 mg/dL    CREATININE 8.98 9.24 - 1.35 mg/dL    eGFRcr - MALE 83 >=39 mL/min/1.70m^2    BUN/CREA RATIO 12 6 - 22   CBC WITH DIFF   Result Value Ref Range    WBC 17.3 (H) 3.7 - 11.0 x10^3/uL    RBC 4.88 4.50 - 6.10 x10^6/uL    HGB 14.3 13.4 - 17.5 g/dL    HCT 57.8 61.0 - 47.9 %    MCV 86.3 78.0 - 100.0 fL    MCH 29.3 26.0 - 32.0 pg    MCHC 34.0 31.0 - 35.5 g/dL    RDW-CV 86.7 88.4 - 84.4 %    PLATELETS 246 150 - 400 x10^3/uL    MPV 11.2 8.7 - 12.5 fL    NEUTROPHIL % 85.0 %    LYMPHOCYTE % 10.0 %    MONOCYTE % 4.2 %    EOSINOPHIL % 0.1 %    BASOPHIL % 0.2 %    NEUTROPHIL # 14.71 (H) 1.50 - 7.70 x10^3/uL    LYMPHOCYTE # 1.73 1.00 - 4.80 x10^3/uL    MONOCYTE # 0.73 0.20 - 1.10 x10^3/uL    EOSINOPHIL # <0.10 <=0.50 x10^3/uL    BASOPHIL # <0.10 <=0.20 x10^3/uL    IMMATURE GRANULOCYTE % 0.5 0.0 - 1.0 %    IMMATURE GRANULOCYTE # <0.10 <0.10 x10^3/uL   POC BLOOD GLUCOSE (RESULTS)   Result Value Ref Range    GLUCOSE, POC 102 (H) 70 - 100 mg/dl   POC BLOOD GLUCOSE (RESULTS)   Result Value Ref Range    GLUCOSE, POC 119 (H) 70 - 100 mg/dl   POC BLOOD GLUCOSE (RESULTS)   Result Value Ref Range    GLUCOSE, POC 112 (H) 70 - 100 mg/dl       Imaging Studies:    Results for orders placed or  performed during the hospital encounter of 03/07/24   XR SINGLE SPINE VIEW     Status: None    Narrative    Gayle TROY RITA  Male, 63 years old.  XR SINGLE SPINE VIEW performed on 03/07/2024 9:10 AM.    REASON FOR EXAM:  C7-T1 foraminotomy, C7-T1 foraminotomy     TECHNIQUE: 1 views/1 images submitted for interpretation.    COMPARISON: Cervical spine radiograph 02/15/2024      Impression    FINDINGS/IMPRESSION:  Single lateral intraoperative view of the cervical spine is submitted. There is redemonstration of anterior fusion hardware overlying C3, C4, and C5. There appears to be instrumentation posteriorly at the level of of C5-C6.          Radiologist location ID: [id number]     FLUORO O-ARM IN OR     Status: None    Narrative    *Procedure not read by radiology.    *Please Refer to Procedure Note for result.     Wanda Nyhan, MD

## 2024-03-08 NOTE — Care Plan (Signed)
 Problem: Wound  Goal: Optimal Coping  Outcome: Ongoing (see interventions/notes)  Goal: Optimal Functional Ability  Outcome: Ongoing (see interventions/notes)  Goal: Absence of Infection Signs and Symptoms  Outcome: Ongoing (see interventions/notes)  Goal: Improved Oral Intake  Outcome: Ongoing (see interventions/notes)  Goal: Optimal Pain Control and Function  Outcome: Ongoing (see interventions/notes)  Goal: Skin Health and Integrity  Outcome: Ongoing (see interventions/notes)  Intervention: Optimize Skin Protection  Recent Flowsheet Documentation  Taken 03/08/2024 0917 by Benedict BROCKS, RN  Pressure Reduction Techniques:   Mobility is maximized   Frequent weight shifting encouraged  Pressure Reduction Devices: Repositioning wedges/pillows utilized  Goal: Optimal Wound Healing  Outcome: Ongoing (see interventions/notes)  Intervention: Promote Wound Healing  Recent Flowsheet Documentation  Taken 03/08/2024 0917 by Benedict BROCKS, RN  Pressure Reduction Techniques:   Mobility is maximized   Frequent weight shifting encouraged  Pressure Reduction Devices: Repositioning wedges/pillows utilized     Problem: Adult Inpatient Plan of Care  Goal: Plan of Care Review  Outcome: Ongoing (see interventions/notes)  Goal: Patient-Specific Goal (Individualized)  Outcome: Ongoing (see interventions/notes)  Goal: Absence of Hospital-Acquired Illness or Injury  Outcome: Ongoing (see interventions/notes)  Intervention: Identify and Manage Fall Risk  Recent Flowsheet Documentation  Taken 03/08/2024 0917 by Benedict BROCKS, RN  Safety Promotion/Fall Prevention:   activity supervised   nonskid shoes/slippers when out of bed   safety round/check completed   fall prevention program maintained  Intervention: Prevent Skin Injury  Recent Flowsheet Documentation  Taken 03/08/2024 0917 by Benedict BROCKS, RN  Skin Protection:   adhesive use limited   tubing/devices free from skin contact  Intervention: Prevent and Manage VTE (Venous Thromboembolism) Risk  Recent Flowsheet  Documentation  Taken 03/08/2024 0917 by Benedict BROCKS, RN  VTE Prevention/Management:   anticoagulant therapy intiated   ambulation promoted   dorsiflexion/plantar flexion performed  Taken 03/08/2024 0800 by Benedict BROCKS, RN  Sequential Compression Device (SCDs): Sequential compression device on  Intervention: Prevent Infection  Recent Flowsheet Documentation  Taken 03/08/2024 0917 by Benedict BROCKS, RN  Infection Prevention:   promote handwashing   rest/sleep promoted  Goal: Optimal Comfort and Wellbeing  Outcome: Ongoing (see interventions/notes)  Goal: Rounds/Family Conference  Outcome: Ongoing (see interventions/notes)     Problem: Skin Injury Risk Increased  Goal: Skin Health and Integrity  Outcome: Ongoing (see interventions/notes)  Intervention: Optimize Skin Protection  Recent Flowsheet Documentation  Taken 03/08/2024 0917 by Benedict BROCKS, RN  Pressure Reduction Techniques:   Mobility is maximized   Frequent weight shifting encouraged  Pressure Reduction Devices: Repositioning wedges/pillows utilized  Skin Protection:   adhesive use limited   tubing/devices free from skin contact     Problem: Health Knowledge, Opportunity to Enhance (Adult,Obstetrics,Pediatric)  Goal: Knowledgeable about Health Subject/Topic  Description: Patient will demonstrate the desired outcomes by discharge/transition of care.  Outcome: Ongoing (see interventions/notes)     Problem: Pain Acute  Goal: Optimal Pain Control and Function  Outcome: Ongoing (see interventions/notes)  Intervention: Prevent or Manage Pain  Recent Flowsheet Documentation  Taken 03/08/2024 0917 by Benedict BROCKS, RN  Medication Review/Management: medications reviewed     Problem: Spinal Surgery  Goal: Optimal Coping with Surgery  Outcome: Ongoing (see interventions/notes)  Goal: Absence of Bleeding  Outcome: Ongoing (see interventions/notes)  Goal: Effective Bowel Elimination  Outcome: Ongoing (see interventions/notes)  Goal: Fluid and Electrolyte Balance  Outcome: Ongoing (see  interventions/notes)  Goal: Optimal Functional Ability  Outcome: Ongoing (see interventions/notes)  Goal: Absence of Infection Signs and Symptoms  Outcome:  Ongoing (see interventions/notes)  Intervention: Prevent or Manage Infection  Recent Flowsheet Documentation  Taken 03/08/2024 0917 by Benedict BROCKS, RN  Infection Prevention:   promote handwashing   rest/sleep promoted  Goal: Optimal Neurologic Function  Outcome: Ongoing (see interventions/notes)  Intervention: Optimize Neurologic Function  Recent Flowsheet Documentation  Taken 03/08/2024 0917 by Benedict BROCKS, RN  Pressure Reduction Devices: Repositioning wedges/pillows utilized  Goal: Anesthesia/Sedation Recovery  Outcome: Ongoing (see interventions/notes)  Intervention: Optimize Anesthesia Recovery  Recent Flowsheet Documentation  Taken 03/08/2024 0917 by Benedict BROCKS, RN  Safety Promotion/Fall Prevention:   activity supervised   nonskid shoes/slippers when out of bed   safety round/check completed   fall prevention program maintained  Goal: Optimal Pain Control and Function  Outcome: Ongoing (see interventions/notes)  Goal: Nausea and Vomiting Relief  Outcome: Ongoing (see interventions/notes)  Goal: Effective Urinary Elimination  Outcome: Ongoing (see interventions/notes)  Goal: Effective Oxygenation and Ventilation  Outcome: Ongoing (see interventions/notes)

## 2024-03-08 NOTE — Care Plan (Signed)
 Problem: Wound  Goal: Skin Health and Integrity  Intervention: Optimize Skin Protection  Recent Flowsheet Documentation  Taken 03/08/2024 2045 by Marvina POUR, LPN  Pressure Reduction Techniques: Frequent weight shifting encouraged  Pressure Reduction Devices:   Specialty bed/mattress utilized   Repositioning wedges/pillows utilized     Problem: Adult Inpatient Plan of Care  Goal: Absence of Hospital-Acquired Illness or Injury  Intervention: Identify and Manage Fall Risk  Recent Flowsheet Documentation  Taken 03/08/2024 2045 by Marvina POUR, LPN  Safety Promotion/Fall Prevention:   activity supervised   nonskid shoes/slippers when out of bed   safety round/check completed     Problem: Adult Inpatient Plan of Care  Goal: Absence of Hospital-Acquired Illness or Injury  Intervention: Prevent and Manage VTE (Venous Thromboembolism) Risk  Recent Flowsheet Documentation  Taken 03/08/2024 2045 by Marvina POUR, LPN  Sequential Compression Device (SCDs): Sequential compression device off  VTE Prevention/Management: anticoagulant therapy maintained  Taken 03/08/2024 2000 by Rochella Benner K, LPN  Sequential Compression Device (SCDs): Sequential compression device off     Chang Tiggs, LPN

## 2024-03-08 NOTE — Progress Notes (Signed)
 [facility]  Spine Post Operative Note    Name: Steve Young   MRN: [id number]   Attending Physician: Fraser Camellia BROCKS, MD [phone] Day: 1    The patient is status post: C7-T1 PCDF    Pre-OP Diagnosis: Pre-Op Diagnosis Codes:      * Cervical stenosis of spinal canal [M48.02]   Surgery/Procedure: Procedure(s) (LRB):  C7-T1 RIGHT FORAMINOTOMY W/POSTERIOR FUSION (N/A)    Subjective:  Sitting up in chair.  Complaining of expected post-op incisional pain into shoulders and bilateral periscapular area.  No reporting any true radicular pain.  Reports that he can't tell if his pre-op right arm symptoms are gone.  Foley out, urinating without difficulty.  Ambulated to BR without problem.  Denies CP, SOB, N/V, fever/chills.    Meds:  Current Medications[1]    Objective:  Filed Vitals:    03/07/24 1854 03/07/24 2306 03/08/24 0235 03/08/24 0602   BP: (!) 167/98 (!) 152/99  (!) 148/90   Pulse: 87 85  85   Resp: 18 18 16 18    Temp: 36.6 C (97.9 F) 36.4 C (97.5 F)  36.4 C (97.5 F)   SpO2: 92% 96%  93%       Labs:  Lab Results   Component Value Date    WBC 17.3 (H) 03/08/2024    HGB 14.3 03/08/2024    HCT 42.1 03/08/2024    MCV 86.3 03/08/2024    PLTCNT 246 03/08/2024     Lab Results   Component Value Date    GLUCOSE 143 (H) 03/08/2024    CALCIUM 9.9 03/08/2024    SODIUM 144 03/08/2024    POTASSIUM 4.9 03/08/2024    CO2 31 03/08/2024    CHLORIDE 104 03/08/2024    BUN 12 03/08/2024    CREATININE 1.01 03/08/2024       General Physical Exam:    The patient is afebrile. Vital signs are stable.  Dressing clean, dry and intact.     Neurological Examination:    Mental Status: Alert and oriented x3.     Right Upper Extremity:   Motor: 5/5 in all major muscle groups.   Sensory: Intact to light touch.   Reflexes: 2 in the extremity. Hoffman's signs are negative.  Left Upper Extremity:   Motor: 5/5 in all major muscle groups.   Sensory: Intact to light touch.   Reflexes: 2 in the extremity. Hoffman's signs are  negative.    Drain:   Output by Drain (mL) 03/06/24 0700 - 03/06/24 1859 03/06/24 1900 - 03/07/24 0659 03/07/24 0700 - 03/07/24 1859 03/07/24 1900 - 03/08/24 0659 03/08/24 0700 - 03/08/24 1208   Leonce Birk Drain Posterior;Right Neck   150 100 40       Labs:   CBC (Last 24 Hours):    Recent Results last 24 hours     03/07/24  1317 03/08/24  0813   WBC 10.7 17.3*   HGB 13.0* 14.3   HCT 37.6* 42.1   MCV 85.1 86.3   PLTCNT 185 246       Problem List/ Diagnosis:  Problem List Items Addressed This Visit    None         Asessment/Plan:  Steve Young is 63 y.o. year old male POD# 1     -Afebrile. VSS. Labs Reviewed- WBC 17.3 H&H 14.3/42.1  -Continue current pain control- will change robaxin to flexeril   -JP OP remains too high to safely remove at this time.   -DVT  Prophylaxis: Heparin 5000 units BID and SCDs  -OOB with PT/OT  -Dispo- pt tells me he only has an uncle at home with him who will prob not be of much help.  Says he could get a friend to change dressing for him.  May need to discuss [facility]      The patient was seen independently with supervising/collaborating physician present for consultation if needed.     Kristin Siemon, PA-C    Electronically signed by Allean Singleton, PA-C  03/08/2024    Seen examined and agree with the above.  Patient is doing relatively well today.  He is still feels weak and a little bit numb in his right arm, but no shooting pains down his arm.  He is having pain across his shoulder blades consistent with his surgical site.  No upper motor neuron signs on exam.    Stable and doing well status post posterior cervical decompression and fusion.  Continue collar.  Continue drain for now.  We will increase his pain medication to help cover this postoperative pain.    Dr. Fraser    This note has been generated by a word recognition computerized program. There may be grammatical, typographical, or word substitution errors which have escaped my editorial review.         [1]   Current  Facility-Administered Medications:     acetaminophen  (TYLENOL ) tablet, 650 mg, Oral, Q4H PRN, Siemon, Kristin, PA-C    albuterol  (PROVENTIL ) 2.5 mg / 3 mL (0.083%) neb solution, 2.5 mg, Nebulization, 4x/day PRN, Siemon, Kristin, PA-C    albuterol  90 mcg per inhalation oral inhaler - Nursing to administer, 1 Puff, Inhalation, Q4H PRN, Siemon, Kristin, PA-C    amitriptyline (ELAVIL) tablet, 25 mg, Oral, NIGHTLY, Siemon, Kristin, PA-C, 25 mg at 03/07/24 2217    atorvastatin  (LIPITOR) tablet, 40 mg, Oral, Daily, Siemon, Kristin, PA-C, 40 mg at 03/07/24 2216    budesonide -formoterol  (SYMBICORT ) 160 mcg-4.5 mcg per inhalation oral inhaler - Nursing to administer, 2 Puff, Inhalation, 2x/day, Siemon, Kristin, PA-C, 2 Puff at 03/08/24 0918    docusate sodium (COLACE) capsule, 100 mg, Oral, 2x/day PRN, Siemon, Kristin, PA-C    famotidine (PEPCID) tablet, 20 mg, Oral, 2x/day PRN, Siemon, Kristin, PA-C    gabapentin  (NEURONTIN ) capsule, 600 mg, Oral, 3x/day, Siemon, Kristin, PA-C, 600 mg at 03/08/24 0916    heparin 5,000 unit/mL injection, 5,000 Units, Subcutaneous, 2x/day, Siemon, Kristin, PA-C, 5,000 Units at 03/08/24 9082    hydrALAZINE (APRESOLINE) tablet, 25 mg, Oral, Q6H PRN, Wreggelsworth, Alan Gibson, PA-C    HYDROmorphone (DILAUDID) 1 mg/mL injection, 1 mg, Intravenous, Q2H PRN, Siemon, Kristin, PA-C, 1 mg at 03/07/24 2252    LR premix infusion, , Intravenous, Continuous, Fraser Camellia BROCKS, MD, Stopped at 03/07/24 1617    magnesium hydroxide (MILK OF MAGNESIA) 400mg  per 5mL oral liquid, 15 mL, Oral, Q6H PRN, Siemon, Kristin, PA-C    methocarbamol (ROBAXIN) tablet, 750 mg, Oral, Q8H, Siemon, Kristin, PA-C, 750 mg at 03/08/24 0918    naloxone (NARCAN) 0.4 mg/mL injection, 0.4 mg, Intravenous, Q2 MIN PRN, Siemon, Kristin, PA-C    NS flush syringe, 3 mL, Intracatheter, Q8HRS, Siemon, Kristin, PA-C, 3 mL at 03/07/24 2200    NS flush syringe, 3 mL, Intracatheter, Q1H PRN, Siemon, Kristin, PA-C    NS flush syringe, 3  mL, Intracatheter, Q8HRS, Siemon, Kristin, PA-C, 3 mL at 03/07/24 2200    NS flush syringe, 3 mL, Intracatheter, Q1H PRN, Siemon, Kristin, PA-C    NS premix infusion, ,  Intravenous, Continuous, Siemon, Kristin, PA-C, Last Rate: 100 mL/hr at 03/07/24 1830, New Bag at 03/07/24 1830    ondansetron  (ZOFRAN ) 2 mg/mL injection, 4 mg, Intravenous, Q6H PRN, Siemon, Kristin, PA-C, 4 mg at 03/07/24 2255    oxyCODONE  (ROXICODONE ) immediate release tablet, 5 mg, Oral, Q4H PRN, Siemon, Kristin, PA-C    oxyCODONE  (ROXICODONE ) immediate release tablet, 10 mg, Oral, Q4H PRN, Siemon, Kristin, PA-C, 10 mg at 03/08/24 9082    pantoprazole  (PROTONIX ) delayed release tablet, 40 mg, Oral, Daily, Siemon, Kristin, PA-C, 40 mg at 03/08/24 0641    prochlorperazine (COMPAZINE) 5 mg/mL injection, 10 mg, Intravenous, Q6H PRN **OR** prochlorperazine (COMPAZINE) tablet, 10 mg, Oral, Q6H PRN, Siemon, Kristin, PA-C

## 2024-03-08 NOTE — Pharmacy (Signed)
 [employer] [facility]  Medication History Changes  03/08/2024    Patient: Steve Young, Steve Young    MRN: [id number]  Date of Birth:  [birth year]    The following medications were    Changed:  0    Added:  potassium     Removed:  0    Information was collected from:  Patient

## 2024-03-08 NOTE — Care Plan (Signed)
 Problem: Wound  Goal: Optimal Coping  Outcome: Ongoing (see interventions/notes)  Goal: Optimal Functional Ability  Outcome: Ongoing (see interventions/notes)  Goal: Absence of Infection Signs and Symptoms  Outcome: Ongoing (see interventions/notes)  Goal: Improved Oral Intake  Outcome: Ongoing (see interventions/notes)  Goal: Optimal Pain Control and Function  Outcome: Ongoing (see interventions/notes)  Goal: Skin Health and Integrity  Outcome: Ongoing (see interventions/notes)  Intervention: Optimize Skin Protection  Recent Flowsheet Documentation  Taken 03/08/2024 0200 by Lang NOVAK, LPN  Pressure Reduction Techniques: Frequent weight shifting encouraged  Pressure Reduction Devices: Repositioning wedges/pillows utilized  Goal: Optimal Wound Healing  Outcome: Ongoing (see interventions/notes)  Intervention: Promote Wound Healing  Recent Flowsheet Documentation  Taken 03/08/2024 0200 by Lang B, LPN  Pressure Reduction Techniques: Frequent weight shifting encouraged  Pressure Reduction Devices: Repositioning wedges/pillows utilized     Problem: Adult Inpatient Plan of Care  Goal: Plan of Care Review  Outcome: Ongoing (see interventions/notes)  Goal: Patient-Specific Goal (Individualized)  Outcome: Ongoing (see interventions/notes)  Goal: Absence of Hospital-Acquired Illness or Injury  Outcome: Ongoing (see interventions/notes)  Intervention: Identify and Manage Fall Risk  Recent Flowsheet Documentation  Taken 03/08/2024 0200 by Lang NOVAK, LPN  Safety Promotion/Fall Prevention:   activity supervised   safety round/check completed   nonskid shoes/slippers when out of bed   fall prevention program maintained  Intervention: Prevent Skin Injury  Recent Flowsheet Documentation  Taken 03/08/2024 0200 by Lang NOVAK, LPN  Skin Protection:   adhesive use limited   tubing/devices free from skin contact  Intervention: Prevent and Manage VTE (Venous Thromboembolism) Risk  Recent Flowsheet Documentation  Taken 03/08/2024 0200 by Lang NOVAK, LPN  Sequential Compression Device (SCDs): Sequential compression device on  VTE Prevention/Management:   anticoagulant therapy maintained   dorsiflexion/plantar flexion performed  Intervention: Prevent Infection  Recent Flowsheet Documentation  Taken 03/08/2024 0200 by Lang NOVAK, LPN  Infection Prevention:   single patient room provided   rest/sleep promoted   promote handwashing  Goal: Optimal Comfort and Wellbeing  Outcome: Ongoing (see interventions/notes)  Goal: Rounds/Family Conference  Outcome: Ongoing (see interventions/notes)     Problem: Skin Injury Risk Increased  Goal: Skin Health and Integrity  Outcome: Ongoing (see interventions/notes)  Intervention: Optimize Skin Protection  Recent Flowsheet Documentation  Taken 03/08/2024 0200 by Lang NOVAK, LPN  Pressure Reduction Techniques: Frequent weight shifting encouraged  Pressure Reduction Devices: Repositioning wedges/pillows utilized  Skin Protection:   adhesive use limited   tubing/devices free from skin contact     Problem: Health Knowledge, Opportunity to Enhance (Adult,Obstetrics,Pediatric)  Goal: Knowledgeable about Health Subject/Topic  Description: Patient will demonstrate the desired outcomes by discharge/transition of care.  Outcome: Ongoing (see interventions/notes)     Problem: Pain Acute  Goal: Optimal Pain Control and Function  Outcome: Ongoing (see interventions/notes)  Intervention: Prevent or Manage Pain  Recent Flowsheet Documentation  Taken 03/08/2024 0200 by Lang NOVAK, LPN  Medication Review/Management: medications reviewed     Problem: Spinal Surgery  Goal: Optimal Coping with Surgery  Outcome: Ongoing (see interventions/notes)  Goal: Absence of Bleeding  Outcome: Ongoing (see interventions/notes)  Goal: Effective Bowel Elimination  Outcome: Ongoing (see interventions/notes)  Goal: Fluid and Electrolyte Balance  Outcome: Ongoing (see interventions/notes)  Goal: Optimal Functional Ability  Outcome: Ongoing (see interventions/notes)  Goal:  Absence of Infection Signs and Symptoms  Outcome: Ongoing (see interventions/notes)  Intervention: Prevent or Manage Infection  Recent Flowsheet Documentation  Taken 03/08/2024 0200 by Lang NOVAK, LPN  Infection Prevention:   single  patient room provided   rest/sleep promoted   promote handwashing  Goal: Optimal Neurologic Function  Outcome: Ongoing (see interventions/notes)  Intervention: Optimize Neurologic Function  Recent Flowsheet Documentation  Taken 03/08/2024 0200 by Lang NOVAK, LPN  Pressure Reduction Devices: Repositioning wedges/pillows utilized  Range of Motion: active ROM (range of motion) encouraged  Goal: Anesthesia/Sedation Recovery  Outcome: Ongoing (see interventions/notes)  Intervention: Optimize Anesthesia Recovery  Recent Flowsheet Documentation  Taken 03/08/2024 0200 by Lang NOVAK, LPN  Safety Promotion/Fall Prevention:   activity supervised   safety round/check completed   nonskid shoes/slippers when out of bed   fall prevention program maintained  Goal: Optimal Pain Control and Function  Outcome: Ongoing (see interventions/notes)  Goal: Nausea and Vomiting Relief  Outcome: Ongoing (see interventions/notes)  Goal: Effective Urinary Elimination  Outcome: Ongoing (see interventions/notes)  Goal: Effective Oxygenation and Ventilation  Outcome: Ongoing (see interventions/notes)

## 2024-03-09 ENCOUNTER — Inpatient Hospital Stay (HOSPITAL_COMMUNITY): Payer: Self-pay

## 2024-03-09 LAB — BASIC METABOLIC PANEL
ANION GAP: 3 mmol/L — ABNORMAL LOW (ref 4–13)
BUN/CREA RATIO: 12 (ref 6–22)
BUN: 11 mg/dL (ref 8–25)
CALCIUM: 9.1 mg/dL (ref 8.6–10.3)
CHLORIDE: 103 mmol/L (ref 96–111)
CO2 TOTAL: 36 mmol/L — ABNORMAL HIGH (ref 23–31)
CREATININE: 0.93 mg/dL (ref 0.75–1.35)
GLUCOSE: 106 mg/dL (ref 65–125)
POTASSIUM: 5.1 mmol/L (ref 3.5–5.1)
SODIUM: 142 mmol/L (ref 136–145)
eGFRcr - MALE: >90 mL/min/1.73mˆ2 (ref >=60)

## 2024-03-09 LAB — CBC WITH DIFF
BASOPHIL #: <0.1 x10ˆ3/uL (ref <=0.20)
BASOPHIL %: 0.6 %
EOSINOPHIL #: <0.1 x10ˆ3/uL (ref <=0.50)
EOSINOPHIL %: 0.8 %
HCT: 37.4 % — ABNORMAL LOW (ref 38.9–52.0)
HGB: 12.3 g/dL — ABNORMAL LOW (ref 13.4–17.5)
IMMATURE GRANULOCYTE #: <0.1 x10ˆ3/uL (ref <0.10)
IMMATURE GRANULOCYTE %: 0.4 % (ref 0.0–1.0)
LYMPHOCYTE #: 2.84 x10ˆ3/uL (ref 1.00–4.80)
LYMPHOCYTE %: 26.6 %
MCH: 29.1 pg (ref 26.0–32.0)
MCHC: 32.9 g/dL (ref 31.0–35.5)
MCV: 88.4 fL (ref 78.0–100.0)
MONOCYTE #: 0.77 x10ˆ3/uL (ref 0.20–1.10)
MONOCYTE %: 7.2 %
MPV: 11 fL (ref 8.7–12.5)
NEUTROPHIL #: 6.86 x10ˆ3/uL (ref 1.50–7.70)
NEUTROPHIL %: 64.4 %
PLATELETS: 201 x10ˆ3/uL (ref 150–400)
RBC: 4.23 x10ˆ6/uL — ABNORMAL LOW (ref 4.50–6.10)
RDW-CV: 13.5 % (ref 11.5–15.5)
WBC: 10.7 x10ˆ3/uL (ref 3.7–11.0)

## 2024-03-09 LAB — PHOSPHORUS: PHOSPHORUS: 3 mg/dL (ref 2.3–4.0)

## 2024-03-09 LAB — POC BLOOD GLUCOSE (RESULTS)
GLUCOSE, POC: 125 mg/dL — ABNORMAL HIGH (ref 70–100)
GLUCOSE, POC: 145 mg/dL — ABNORMAL HIGH (ref 70–100)
GLUCOSE, POC: 168 mg/dL — ABNORMAL HIGH (ref 70–100)
GLUCOSE, POC: 246 mg/dL — ABNORMAL HIGH (ref 70–100)

## 2024-03-09 LAB — MAGNESIUM: MAGNESIUM: 2.2 mg/dL (ref 1.8–2.6)

## 2024-03-09 MED ORDER — OXYCODONE 15 MG TABLET
15.0000 mg | ORAL_TABLET | Freq: Four times a day (QID) | ORAL | 0 refills | Status: DC | PRN
Start: 2024-03-09 — End: 2024-03-23

## 2024-03-09 MED ORDER — DEXAMETHASONE SODIUM PHOSPHATE 4 MG/ML INJECTION SOLUTION
8.0000 mg | Freq: Three times a day (TID) | INTRAMUSCULAR | Status: AC
Start: 2024-03-09 — End: 2024-03-09
  Administered 2024-03-09 (×3): 8 mg via INTRAVENOUS
  Filled 2024-03-09 (×3): qty 2

## 2024-03-09 MED ORDER — CYCLOBENZAPRINE 10 MG TABLET
10.0000 mg | ORAL_TABLET | Freq: Three times a day (TID) | ORAL | 0 refills | Status: AC | PRN
Start: 2024-03-09 — End: ?

## 2024-03-09 NOTE — Progress Notes (Signed)
 [facility]  Spine Post Operative Note    Name: Steve Young   MRN: [id number]   Attending Physician: Fraser Camellia BROCKS, MD [phone] Day: 2    The patient is status post: C7-T1 PCDF    Pre-OP Diagnosis: Pre-Op Diagnosis Codes:      * Cervical stenosis of spinal canal [M48.02]   Surgery/Procedure: Procedure(s) (LRB):  C7-T1 RIGHT FORAMINOTOMY W/POSTERIOR FUSION (N/A)    Subjective:  Sitting up in chair.  Complaining of expected post-op incisional pain into shoulders and bilateral periscapular area. Also started having aching in right forearm early this morning.  No weakness noted.  Ambulating well with walker    Meds:  Current Medications[1]    Objective:  Filed Vitals:    03/08/24 1436 03/08/24 1933 03/09/24 0044 03/09/24 0633   BP: (!) 142/90 (!) 143/88 (!) 147/92 (!) 167/90   Pulse: 74 73 69 77   Resp: 18 17 18 17    Temp: 36.9 C (98.4 F) 37.1 C (98.8 F) 36.7 C (98.1 F) 36.9 C (98.4 F)   SpO2: 97% 92% 95% 95%       Labs:  Lab Results   Component Value Date    WBC 10.7 03/09/2024    HGB 12.3 (L) 03/09/2024    HCT 37.4 (L) 03/09/2024    MCV 88.4 03/09/2024    PLTCNT 201 03/09/2024     Lab Results   Component Value Date    GLUCOSE 106 03/09/2024    CALCIUM 9.1 03/09/2024    SODIUM 142 03/09/2024    POTASSIUM 5.1 03/09/2024    CO2 36 (H) 03/09/2024    CHLORIDE 103 03/09/2024    BUN 11 03/09/2024    CREATININE 0.93 03/09/2024       General Physical Exam:    The patient is afebrile. Vital signs are stable.      Neurological Examination:    Mental Status: Alert and oriented x3.     Right Upper Extremity:   Motor: 5/5 in all major muscle groups.   Sensory: Intact to light touch.   Reflexes: 2 in the extremity. Hoffman's signs are negative.  Left Upper Extremity:   Motor: 5/5 in all major muscle groups.   Sensory: Intact to light touch.   Reflexes: 2 in the extremity. Hoffman's signs are negative.    Drain:   Output by Drain (mL) 03/07/24 0700 - 03/07/24 1859 03/07/24 1900 - 03/08/24 0659  03/08/24 0700 - 03/08/24 1859 03/08/24 1900 - 03/09/24 0659 03/09/24 0700 - 03/09/24 1147   Jackson Pratt Drain Posterior;Right Neck 150 100 40 95        Labs:   CBC (Last 24 Hours):    Recent Results last 24 hours     03/09/24  0708   WBC 10.7   HGB 12.3*   HCT 37.4*   MCV 88.4   PLTCNT 201       Problem List/ Diagnosis:  Problem List Items Addressed This Visit          Musculoskeletal    * (Principal) Cervical stenosis of spinal canal - Primary    Relevant Orders    DME - WALKER Wheeled Walker; Walker Accessories: None       Other    S/P cervical spinal fusion    Relevant Orders    DME - WALKER Wheeled Walker; Walker Accessories: None          Asessment/Plan:  [name] is 63 y.o. year old male POD# 2     -  Afebrile. VSS. Labs Reviewed- WBC 10.7 H&H 12.3/37.4  -Continue current pain control- added decadron  -JP OP remains too high to safely remove at this time.   -DVT Prophylaxis: Heparin 5000 units BID and SCDs  -OOB with PT/OT  -Dispo- possibly home over the weekend if drain able to be removed and pain better controlled      The patient was seen independently with supervising/collaborating physician present for consultation if needed.     Avanthika Dehnert, PA-C    Electronically signed by Allean Singleton, PA-C  03/09/2024    This note has been generated by a word recognition computerized program. There may be grammatical, typographical, or word substitution errors which have escaped my editorial review.           [1]   Current Facility-Administered Medications:     acetaminophen  (TYLENOL ) tablet, 650 mg, Oral, Q4H PRN, Oliver Heitzenrater, PA-C    albuterol  (PROVENTIL ) 2.5 mg / 3 mL (0.083%) neb solution, 2.5 mg, Nebulization, 4x/day PRN, Kuulei Kleier, PA-C    albuterol  90 mcg per inhalation oral inhaler - Nursing to administer, 1 Puff, Inhalation, Q4H PRN, Derran Sear, PA-C    amitriptyline (ELAVIL) tablet, 25 mg, Oral, NIGHTLY, Marcello Tuzzolino, PA-C, 25 mg at 03/08/24 2045    atorvastatin  (LIPITOR)  tablet, 40 mg, Oral, Daily, Jaleigh Mccroskey, PA-C, 40 mg at 03/08/24 2045    budesonide -formoterol  (SYMBICORT ) 160 mcg-4.5 mcg per inhalation oral inhaler - Nursing to administer, 2 Puff, Inhalation, 2x/day, Nivedita Mirabella, PA-C, 2 Puff at 03/09/24 9144    Correction/SSIP insulin  lispro 100 units/mL injection pen, 2-9 Units, Subcutaneous, 4x/day AC, Olevia Reusing, MD    cyclobenzaprine  (FLEXERIL ) tablet, 10 mg, Oral, Q8H PRN, Nora Sabey, PA-C, 10 mg at 03/09/24 0549    D10W bolus infusion 250 mL, 250 mL, Intravenous, Q1H PRN, Flinn, Christine, MD    dexAMETHasone 4 mg/mL injection, 8 mg, Intravenous, Q8H, Fraser Camellia BROCKS, MD, 8 mg at 03/09/24 9071    dextrose  (GLUTOSE) 40% oral gel, 15 g, Oral, Q15 Min PRN, Flinn, Christine, MD    dextrose  50% (0.5 g/mL) injection - syringe, 12.5 g, Intravenous, Q15 Min PRN, Flinn, Christine, MD    docusate sodium (COLACE) capsule, 100 mg, Oral, 2x/day PRN, Wakisha Alberts, PA-C    famotidine (PEPCID) tablet, 20 mg, Oral, 2x/day PRN, Nehemie Casserly, PA-C    gabapentin  (NEURONTIN ) capsule, 600 mg, Oral, 3x/day, Neythan Kozlov, PA-C, 600 mg at 03/09/24 9072    glucagon injection 1 mg, 1 mg, IntraMUSCULAR, Once PRN, Flinn, Christine, MD    heparin 5,000 unit/mL injection, 5,000 Units, Subcutaneous, 2x/day, Jamine Highfill, PA-C, 5,000 Units at 03/09/24 0855    HYDROmorphone (DILAUDID) 1 mg/mL injection, 1 mg, Intravenous, Q2H PRN, Fady Stamps, PA-C, 1 mg at 03/09/24 0809    magnesium hydroxide (MILK OF MAGNESIA) 400mg  per 5mL oral liquid, 15 mL, Oral, Q6H PRN, Rosaleah Person, PA-C    melatonin tablet, 6 mg, Oral, NIGHTLY, Flinn, Christine, MD, 6 mg at 03/08/24 2046    naloxone (NARCAN) 0.4 mg/mL injection, 0.4 mg, Intravenous, Q2 MIN PRN, Heddy Vidana, PA-C    NS flush syringe, 3 mL, Intracatheter, Q8HRS, Hayle Parisi, PA-C, 3 mL at 03/08/24 2048    NS flush syringe, 3 mL, Intracatheter, Q1H PRN, Jalilah Wiltsie, PA-C    NS flush syringe, 3 mL, Intracatheter,  Q8HRS, Tesslyn Baumert, PA-C, 3 mL at 03/08/24 2048    NS flush syringe, 3 mL, Intracatheter, Q1H PRN, Arriyanna Mersch, PA-C    ondansetron  (ZOFRAN ) 2 mg/mL  injection, 4 mg, Intravenous, Q6H PRN, Rosaria Kubin, PA-C, 4 mg at 03/07/24 2255    oxyCODONE  (ROXICODONE ) immediate release tablet, 15 mg, Oral, Q4H PRN, Fraser Camellia BROCKS, MD, 15 mg at 03/09/24 1133    oxyCODONE  (ROXICODONE ) immediate release tablet, 10 mg, Oral, Q4H PRN, Fraser Camellia BROCKS, MD    pantoprazole  (PROTONIX ) delayed release tablet, 40 mg, Oral, Daily, Tamitha Norell, PA-C, 40 mg at 03/09/24 9070    polyethylene glycol (MIRALAX) oral packet, 17 g, Oral, Daily, Flinn, Christine, MD    prochlorperazine (COMPAZINE) 5 mg/mL injection, 10 mg, Intravenous, Q6H PRN **OR** prochlorperazine (COMPAZINE) tablet, 10 mg, Oral, Q6H PRN, Sharlena Kristensen, PA-C

## 2024-03-09 NOTE — OT Treatment (Addendum)
 [facility]  Occupational Therapy Progress Note     03/09/24     Patient:  Steve Young   MR Number:  [id number]   Date of Birth: [birth year]   Admitting Diagnosis: Cervical stenosis of spinal canal [M48.02]  S/P cervical spinal fusion [Z98.1]     Treatment Minutes: 15     Precautions/Restrictions: FULL CODE: ATTEMPT RESUSCITATION/CPR, Fall risk, Spinal precautions: Logroll only, no twisting or bending, no lifting over 10 lbs, Brace on at all times: Systems Developer   Medical Lines: JP drain      Respiratory Status: Room air   Patient Effort: Adequate   Symptoms Noted During/After Session: fatigue, pain     Pretreatment status: Patient seated in bedside chair/wheelchair          Mental Status: Alert and Oriented   Behavior/Mood Observations: Cooperative   Follows Commands: Physical/tactile prompts required and Verbal cues/prompting required     Numeric pain scale:    10/10 neck pain     Skin integrity: Not Assessed     Areas of functional mobility addressed:     Assistive devices used with bed mobility:  bed rails and head of bed elevated     Supine - Sit:  Supervision     Sit - Supine: Moderate Assist     Sit - stand:  Minimum Assist     Bed - Chair/ Chair - bed:  Not tested     Balance while sitting STATIC:  Fair+    DYNAMIC: Fair      Balance while standing STATIC: Fair-   DYNAMIC: Poor+        ADL's Addressed:     UB Dressing:Not tested     UB Bathing:Not tested     LB Dressing: Supervision using adaptive equipment     LB Bathing: Not tested     Toileting: Not tested     Grooming: Not tested     Self Feeding: Not tested     Post Treatment Position: Patient seated in bedside chair/wheelchair Call light within reach and Sitter select activated     Clinical Impression:    Assessment: This therapist reviewed the use of reacher and sock aid with donning socks and pants.  He demonstrated good understanding. Also, this therapist issued the patient a toilet aid to avoid twisting and follow spine  precautions.        Anticipated Discharge Disposition: Inpatient rehab facility         Mliss DELENA Senters, OT

## 2024-03-09 NOTE — Consults (Addendum)
 [facility]  Internal Medicine Consultation  Date of Service:  03/09/2024  Steve Young, Steve Young, 63 y.o. male  Date of Admission:  03/07/2024  Date of Birth:  20-Jan-1961    Reason for Consultation: Perioperative Medical Management    Assessment and Plan:  This is a 63 yr M patient who presented to [facility] on 10/22 for planned spine surgery. Significant PMH Cervical Spine Stenosis, T2DM, HTN, HLD, MASLD, Asthma, GERD, Vitamin D  Deficiency. Patient underwent C7-T Posterior Decompression and Fusion on 10/22 without complications. Anticipate patient to be medically ready for discharge in 2-3 days if meeting postoperative goals. PT/OT recommend IRU.    Cervical Spine Stenosis s/p C7-T Posterior Decompression and Fusion on 10/22  -further management per primary team. Start on IV Dexamethasone.  -PT/OT consult  -continue Elavil qhs and Gabapentin . Hold home Ibuprofen .    Leukocytosis - Resolved  -most likely reactive to above  -afebrile  -monitor CBC    Asthma  -stable on RA  -continue home inhaler  -aggressive pulmonary toilet, nebs PRN    T2DM  MASLD  -hold home Ozempic   -conservative SSI  -diabetic diet    HTN  -patient not currently on any antihypertensive medications    HLD  -continue Lipitor    GERD  -continue PPI    Hold home Aspirin     DVT Prophylaxis: per primary team  Full Code  Disposition: TBD    Subjective:    Patient seen after receiving therapy. He is stating that he has developed a new pain in his right arm that was not present previously otherwise no complaints outside of expected postoperative pain.      acetaminophen  (TYLENOL ) tablet, 650 mg, Oral, Q4H PRN  albuterol  (PROVENTIL ) 2.5 mg / 3 mL (0.083%) neb solution, 2.5 mg, Nebulization, 4x/day PRN  albuterol  90 mcg per inhalation oral inhaler - Nursing to administer, 1 Puff, Inhalation, Q4H PRN  amitriptyline (ELAVIL) tablet, 25 mg, Oral, NIGHTLY  atorvastatin  (LIPITOR) tablet, 40 mg, Oral, Daily  budesonide -formoterol  (SYMBICORT ) 160  mcg-4.5 mcg per inhalation oral inhaler - Nursing to administer, 2 Puff, Inhalation, 2x/day  Correction/SSIP insulin  lispro 100 units/mL injection pen, 2-9 Units, Subcutaneous, 4x/day AC  cyclobenzaprine  (FLEXERIL ) tablet, 10 mg, Oral, Q8H PRN  D10W bolus infusion 250 mL, 250 mL, Intravenous, Q1H PRN  dexAMETHasone 4 mg/mL injection, 8 mg, Intravenous, Q8H  dextrose  (GLUTOSE) 40% oral gel, 15 g, Oral, Q15 Min PRN  dextrose  50% (0.5 g/mL) injection - syringe, 12.5 g, Intravenous, Q15 Min PRN  docusate sodium (COLACE) capsule, 100 mg, Oral, 2x/day PRN  famotidine (PEPCID) tablet, 20 mg, Oral, 2x/day PRN  gabapentin  (NEURONTIN ) capsule, 600 mg, Oral, 3x/day  glucagon injection 1 mg, 1 mg, IntraMUSCULAR, Once PRN  heparin 5,000 unit/mL injection, 5,000 Units, Subcutaneous, 2x/day  HYDROmorphone (DILAUDID) 1 mg/mL injection, 1 mg, Intravenous, Q2H PRN  magnesium hydroxide (MILK OF MAGNESIA) 400mg  per 5mL oral liquid, 15 mL, Oral, Q6H PRN  melatonin tablet, 6 mg, Oral, NIGHTLY  naloxone (NARCAN) 0.4 mg/mL injection, 0.4 mg, Intravenous, Q2 MIN PRN  NS flush syringe, 3 mL, Intracatheter, Q8HRS  NS flush syringe, 3 mL, Intracatheter, Q1H PRN  NS flush syringe, 3 mL, Intracatheter, Q8HRS  NS flush syringe, 3 mL, Intracatheter, Q1H PRN  ondansetron  (ZOFRAN ) 2 mg/mL injection, 4 mg, Intravenous, Q6H PRN  oxyCODONE  (ROXICODONE ) immediate release tablet, 15 mg, Oral, Q4H PRN  oxyCODONE  (ROXICODONE ) immediate release tablet, 10 mg, Oral, Q4H PRN  pantoprazole  (PROTONIX ) delayed release tablet, 40 mg, Oral, Daily  polyethylene  glycol (MIRALAX) oral packet, 17 g, Oral, Daily  prochlorperazine (COMPAZINE) 5 mg/mL injection, 10 mg, Intravenous, Q6H PRN   Or  prochlorperazine (COMPAZINE) tablet, 10 mg, Oral, Q6H PRN      Physical Exam:    Temperature: 36.6 C (97.9 F) Heart Rate: 87 BP (Non-Invasive): (!) 130/99   Respiratory Rate: 16 SpO2: 95 %       Exam:  Nursing note and vitals reviewed.   General: 63 y.o. male, appears stated  age, in no acute distress, sitting upright in bed  Eyes: PERRLA. EOM intact. Clear conjunctivae.  HENT: NCAT. Oral and nasal mucosa moist without erythema or exudate.   Neck: +C Collar and JP drain  CV: Regular rate and rhythm without murmurs  Lungs: Clear to auscultation bilaterally. No wheezes, crackles or rhonchi. Comfortable work of breathing on RA.  Abdomen: Soft, non-tender, non-distended, bowel sounds present  Neuro: Alert and answers questions appropriately. No apparent focal neurological deficits.  Musculoskeletal: Grossly normal limb movements without obvious deformity  Extremities: No peripheral edema  Skin:Warm and dry. No visible rashes or lesions.   Psych: Behavior, speech and affect are appropriate.     Labs:     Results for orders placed or performed during the hospital encounter of 03/07/24 (from the past 24 hours)   POC BLOOD GLUCOSE (RESULTS)   Result Value Ref Range    GLUCOSE, POC 112 (H) 70 - 100 mg/dl   PHOSPHORUS   Result Value Ref Range    PHOSPHORUS 3.0 2.3 - 4.0 mg/dL   MAGNESIUM   Result Value Ref Range    MAGNESIUM 2.2 1.8 - 2.6 mg/dL   BASIC METABOLIC PANEL   Result Value Ref Range    SODIUM 142 136 - 145 mmol/L    POTASSIUM 5.1 3.5 - 5.1 mmol/L    CHLORIDE 103 96 - 111 mmol/L    CO2 TOTAL 36 (H) 23 - 31 mmol/L    ANION GAP 3 (L) 4 - 13 mmol/L    CALCIUM 9.1 8.6 - 10.3 mg/dL    GLUCOSE 893 65 - 874 mg/dL    BUN 11 8 - 25 mg/dL    CREATININE 9.06 9.24 - 1.35 mg/dL    eGFRcr - MALE >09 >=39 mL/min/1.64m^2    BUN/CREA RATIO 12 6 - 22   CBC WITH DIFF   Result Value Ref Range    WBC 10.7 3.7 - 11.0 x10^3/uL    RBC 4.23 (L) 4.50 - 6.10 x10^6/uL    HGB 12.3 (L) 13.4 - 17.5 g/dL    HCT 62.5 (L) 61.0 - 52.0 %    MCV 88.4 78.0 - 100.0 fL    MCH 29.1 26.0 - 32.0 pg    MCHC 32.9 31.0 - 35.5 g/dL    RDW-CV 86.4 88.4 - 84.4 %    PLATELETS 201 150 - 400 x10^3/uL    MPV 11.0 8.7 - 12.5 fL    NEUTROPHIL % 64.4 %    LYMPHOCYTE % 26.6 %    MONOCYTE % 7.2 %    EOSINOPHIL % 0.8 %    BASOPHIL % 0.6 %     NEUTROPHIL # 6.86 1.50 - 7.70 x10^3/uL    LYMPHOCYTE # 2.84 1.00 - 4.80 x10^3/uL    MONOCYTE # 0.77 0.20 - 1.10 x10^3/uL    EOSINOPHIL # <0.10 <=0.50 x10^3/uL    BASOPHIL # <0.10 <=0.20 x10^3/uL    IMMATURE GRANULOCYTE % 0.4 0.0 - 1.0 %    IMMATURE GRANULOCYTE # <0.10 <0.10  x10^3/uL   POC BLOOD GLUCOSE (RESULTS)   Result Value Ref Range    GLUCOSE, POC 125 (H) 70 - 100 mg/dl   POC BLOOD GLUCOSE (RESULTS)   Result Value Ref Range    GLUCOSE, POC 145 (H) 70 - 100 mg/dl   POC BLOOD GLUCOSE (RESULTS)   Result Value Ref Range    GLUCOSE, POC 168 (H) 70 - 100 mg/dl       Imaging Studies:    Results for orders placed or performed during the hospital encounter of 03/07/24   XR SINGLE SPINE VIEW     Status: None    Narrative    Quindell LANI PORTA  Male, 63 years old.    XR SINGLE SPINE VIEW performed on 03/07/2024 9:10 AM.    REASON FOR EXAM:  C7-T1 foraminotomy, C7-T1 foraminotomy     TECHNIQUE: 1 views/1 images submitted for interpretation.    COMPARISON: Cervical spine radiograph 02/15/2024      Impression    FINDINGS/IMPRESSION:  Single lateral intraoperative view of the cervical spine is submitted. There is redemonstration of anterior fusion hardware overlying C3, C4, and C5. There appears to be instrumentation posteriorly at the level of of C5-C6.          Radiologist location ID: [id number]     FLUORO O-ARM IN OR     Status: None    Narrative    *Procedure not read by radiology.    *Please Refer to Procedure Note for result.     Wanda Nyhan, MD

## 2024-03-09 NOTE — Care Management Notes (Addendum)
 [facility]  Care Management Note    Patient Name: Steve Young  Date of Birth: 1960/12/22  Sex: male  Date/Time of Admission: 03/07/2024  6:06 AM  Room/Bed: 1402/A  Payor: [place] MEDICARE / Plan: [place] MEDICARE DUAL COMPLETE / Product Type: MEDICARE [facility] /    LOS: 2 days   Primary Care Providers:  Lucinda Millman, MD, MD (General)    Admitting Diagnosis:  Cervical stenosis of spinal canal [M48.02]  S/P cervical spinal fusion [Z98.1]    Assessment:      03/09/24 1317   Assessment Details   Assessment Type Continued Assessment   Date of Care Management Update 03/09/24   Care Management Plan   Discharge Planning Status plan in progress   Discharge plan discussed with: Patient   Patient choice offered to patient/family Yes   Discharge Needs Assessment   Discharge Facility/Level of Care Needs Acute Rehab Placement/Return (not psych)(code 62)         Discharge Plan:  Acute Rehab Placement/Return (not psych) (code 81)  Discussed OT rec for acute rehab. 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 Pt states his friend and drury are assisting his uncle while he's in the hospital. FOC with CMS stars ratings offered for acute rehab if he is agreeable. Pt states he will let CM know.    Addendum: Pt agreeable to [facility] IRU. Referral sent via [facility].     The patient will continue to be evaluated for developing discharge needs.     Case Manager: Madelin Ka, RN  Phone: [phone]

## 2024-03-09 NOTE — PT Treatment (Signed)
 REHABILITATION SERVICES  Physical Therapy Progress Note   03/09/24     Patient:  Steve Young   MR Number:  [id number]   Date of Birth: 08-21-1960   Admitting Diagnosis: Cervical stenosis of spinal canal [M48.02]  S/P cervical spinal fusion [Z98.1]     Treatment Minutes: 20     Precautions/Restrictions: FULL CODE: ATTEMPT RESUSCITATION/CPR, Fall risk, Spinal precautions, Brace on at all times: Aspen collar   Medical Lines: JP drain      Respiratory Status: Room air   Patient Effort: Adequate   Symptoms Noted During/After Session: fatigue, increased pain      Mental Status: Alert and Oriented   Behavior/Mood Observations: Cooperative   Follows Commands: Physical/tactile prompts required and Verbal cues/prompting required    Prior Level of Function: Independent with no assistive device    Living Environment: Lives with his uncle, whom the patient cares for   Home Set-up : One story with basement (uncle lines in basement)   Steps: Exterior: 1 step(s) with no handrails     Interior: There are stairs to the basement but p doesn't need to go downstairs      Pretreatment status: Patient in bed     Comment:      Numeric pain scale:     Reports 8/10 neck pain    Skin integrity: Not Assessed      Vital Signs: Not Assessed       Areas of functional mobility addressed:     Assistive devices used with bed mobility:  bed rails and head of bed elevated     Supine - Sit:  Supervision     Sit - Supine: Moderate Assist     Sit - stand:  Minimum Assist     Bed - Chair/ Chair - bed:  Minimum Assist     Car transfers:  Minimum Assist     Balance while sitting STATIC:  Fair    DYNAMIC: Fair-      Balance while standing STATIC: Poor+   DYNAMIC: Poor+     Comments:       Areas of gait training assessed:       Ambulation distance:  90 feet     Level of assistance:  Minimum Assist     Assistive devices:  Wheeled Walker       Stair climbing: Not tested       # of Stairs:         Handrails:        Device:      Comments: deferred stair  training as pt's legs were weak and shaky after walking and performing car transfer        Post Treatment Position: Supine in bed Call light within reach, Telephone within reach, and Patient safety alarm activated      Clinical Impression:    Assessment: pt reports that he needs to be IND prior to returning home. 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 Recommended to pt to seek out further inpatient rehab. He was agreeable.       Anticipated Discharge Disposition: Inpatient rehab facility       [facility], PTA

## 2024-03-10 ENCOUNTER — Inpatient Hospital Stay (HOSPITAL_COMMUNITY): Payer: Self-pay

## 2024-03-10 DIAGNOSIS — D62 Acute posthemorrhagic anemia: Secondary | ICD-10-CM

## 2024-03-10 DIAGNOSIS — Z48811 Encounter for surgical aftercare following surgery on the nervous system: Secondary | ICD-10-CM

## 2024-03-10 LAB — BASIC METABOLIC PANEL
ANION GAP: 5 mmol/L (ref 4–13)
BUN/CREA RATIO: 16 (ref 6–22)
BUN: 13 mg/dL (ref 8–25)
CALCIUM: 9.5 mg/dL (ref 8.6–10.3)
CHLORIDE: 103 mmol/L (ref 96–111)
CO2 TOTAL: 31 mmol/L (ref 23–31)
CREATININE: 0.81 mg/dL (ref 0.75–1.35)
GLUCOSE: 138 mg/dL — ABNORMAL HIGH (ref 65–125)
POTASSIUM: 4.4 mmol/L (ref 3.5–5.1)
SODIUM: 139 mmol/L (ref 136–145)
eGFRcr - MALE: >90 mL/min/1.73mˆ2 (ref >=60)

## 2024-03-10 LAB — POC BLOOD GLUCOSE (RESULTS)
GLUCOSE, POC: 217 mg/dL — ABNORMAL HIGH (ref 70–100)
GLUCOSE, POC: 124 mg/dL — ABNORMAL HIGH (ref 70–100)
GLUCOSE, POC: 171 mg/dL — ABNORMAL HIGH (ref 70–100)
GLUCOSE, POC: 135 mg/dL — ABNORMAL HIGH (ref 70–100)

## 2024-03-10 LAB — CBC WITH DIFF
BASOPHIL #: <0.1 x10ˆ3/uL (ref <=0.20)
BASOPHIL %: 0.1 %
EOSINOPHIL #: <0.1 x10ˆ3/uL (ref <=0.50)
EOSINOPHIL %: 0 %
HCT: 37.4 % — ABNORMAL LOW (ref 38.9–52.0)
HGB: 12.5 g/dL — ABNORMAL LOW (ref 13.4–17.5)
IMMATURE GRANULOCYTE #: <0.1 x10ˆ3/uL (ref <0.10)
IMMATURE GRANULOCYTE %: 0.5 % (ref 0.0–1.0)
LYMPHOCYTE #: 1.01 x10ˆ3/uL (ref 1.00–4.80)
LYMPHOCYTE %: 6.6 %
MCH: 29.1 pg (ref 26.0–32.0)
MCHC: 33.4 g/dL (ref 31.0–35.5)
MCV: 87.2 fL (ref 78.0–100.0)
MONOCYTE #: 0.67 x10ˆ3/uL (ref 0.20–1.10)
MONOCYTE %: 4.4 %
MPV: 11.3 fL (ref 8.7–12.5)
NEUTROPHIL #: 13.57 x10ˆ3/uL — ABNORMAL HIGH (ref 1.50–7.70)
NEUTROPHIL %: 88.4 %
PLATELETS: 219 x10ˆ3/uL (ref 150–400)
RBC: 4.29 x10ˆ6/uL — ABNORMAL LOW (ref 4.50–6.10)
RDW-CV: 13.2 % (ref 11.5–15.5)
WBC: 15.4 x10ˆ3/uL — ABNORMAL HIGH (ref 3.7–11.0)

## 2024-03-10 LAB — MAGNESIUM: MAGNESIUM: 2.1 mg/dL (ref 1.8–2.6)

## 2024-03-10 LAB — PHOSPHORUS: PHOSPHORUS: 2.8 mg/dL (ref 2.3–4.0)

## 2024-03-10 NOTE — Progress Notes (Signed)
 [facility]  Spine Post Operative Note    Name: Steve Young   MRN: [id number]   Attending Physician: Fraser Camellia BROCKS, MD [phone] Day: 3    The patient is status post:  Cervicothoracic foraminotomy with instrumented arthrodesis postop day 3    Pre-OP Diagnosis: Pre-Op Diagnosis Codes:      * Cervical stenosis of spinal canal [M48.02]   Surgery/Procedure: Procedure(s) (LRB):  C7-T1 RIGHT FORAMINOTOMY W/POSTERIOR FUSION (N/A)    Subjective:  No acute events overnight.  Patient continues to have right-sided upper extremity radicular pain and subjective weakness however this is unchanged compared to 24 hours ago.  He is tolerating his diet and voiding without difficulty.  He has been up and walking around his room which he has where I found him on my examination.    Meds:  Current Medications[1]    Objective:  Filed Vitals:    03/09/24 1400 03/09/24 1429 03/09/24 1948 03/10/24 0604   BP: 117/89  (!) 130/99 (!) 147/80   Pulse: 94  87 78   Resp: 16 18 16 18    Temp: 36.9 C (98.4 F)  36.6 C (97.9 F) 36.1 C (97 F)   SpO2: 98%  95% 91%       Labs:  Lab Results   Component Value Date    WBC 15.4 (H) 03/10/2024    HGB 12.5 (L) 03/10/2024    HCT 37.4 (L) 03/10/2024    MCV 87.2 03/10/2024    PLTCNT 219 03/10/2024     Lab Results   Component Value Date    GLUCOSE 138 (H) 03/10/2024    CALCIUM 9.5 03/10/2024    SODIUM 139 03/10/2024    POTASSIUM 4.4 03/10/2024    CO2 31 03/10/2024    CHLORIDE 103 03/10/2024    BUN 13 03/10/2024    CREATININE 0.81 03/10/2024       General Physical Exam:  BP (!) 147/80   Pulse 78   Temp 36.1 C (97 F)   Resp 18   Ht 1.753 m (5' 9.02)   Wt 90.7 kg (199 lb 15.3 oz)   SpO2 91%   BMI 29.51 kg/m         Physical Exam:    The patient is afebrile. Vital signs are stable.  Dressing clean, dry and intact.     Neurological Examination:    Mental Status: Alert and oriented x3.     Right Upper Extremity:   Motor:  Deltoid 3/5 however secondary to poor effort due to  incisional pain.  Biceps triceps 5/5.  Grip strength 4/5.  Sensory: Intact to light touch.   Reflexes: 2 in the extremity. Hoffman's signs are negative.  Left Upper Extremity:   Motor: Deltoid 3/5 however secondary to poor effort due to incisional pain.  Biceps triceps 5/5.  Grip strength 5/5.  Sensory: Intact to light touch.   Reflexes: 2 in the extremity. Hoffman's signs are negative.  Right Lower Extremity:   Motor: 5/5 in all major muscle groups.   Sensory: Intact to light touch.   Reflexes: 2 in the extremity. Clonus negative.  Left Lower Extremity:   Motor: 5/5 in all major muscle groups.   Sensory: Intact to light touch.   Reflexes: 2 in the extremity. Clonus negative.    Drain:   Output by Drain (mL) 03/08/24 0700 - 03/08/24 1859 03/08/24 1900 - 03/09/24 9340 03/09/24 0700 - 03/09/24 1859 03/09/24 1900 - 03/10/24 0659 03/10/24 0700 - 03/10/24 1213  Patient has no LDAs of requested type attached.       Labs:   CBC (Last 24 Hours):    Recent Results last 24 hours     03/10/24  0720   WBC 15.4*   HGB 12.5*   HCT 37.4*   MCV 87.2   PLTCNT 219         Imaging:         Problem List/ Diagnosis:  Problem List Items Addressed This Visit       * (Principal) Cervical stenosis of spinal canal - Primary    Relevant Orders    DME - WALKER Wheeled Walker; Walker Accessories: None    S/P cervical spinal fusion    Relevant Orders    DME - WALKER Wheeled Walker; Walker Accessories: None          Asessment/Plan:    63 y.o. year old male POD# 3     His leukocytosis is most likely secondary to steroids.  He had told me that he would like to be transferred to the inpatient rehab unit but I have told him that given his ease of ambulation that this would be unlikely.  Instead, I discussed with him that the options would be either home versus a skilled nursing facility.  He told me that he would rather go however I see that he is requiring IV Dilaudid and prefers this rather than his oral pain medication.  I suggested that we a to  discharge him home tomorrow but that he focuses on taking the oral medication alone leaving the Dilaudid only for breakthrough as in his has been ordered.  He is in agreement with the plan.      -Afebrile. VSS. Labs Reviewed  -Pain Control  -TEDs/SCDs  -DVT Prophylaxis: Heparin 5000 units BID  -OOB with PT/OT  -Dispo-possible discharge home tomorrow    Electronically signed by Raynell Sheen, MD  03/10/2024    This note has been generated by a word recognition computerized program. There may be grammatical, typographical, or word substitution errors which have escaped my editorial review.         [1]   Current Facility-Administered Medications:     acetaminophen  (TYLENOL ) tablet, 650 mg, Oral, Q4H PRN, Siemon, Kristin, PA-C    albuterol  (PROVENTIL ) 2.5 mg / 3 mL (0.083%) neb solution, 2.5 mg, Nebulization, 4x/day PRN, Siemon, Kristin, PA-C    albuterol  90 mcg per inhalation oral inhaler - Nursing to administer, 1 Puff, Inhalation, Q4H PRN, Siemon, Kristin, PA-C    amitriptyline (ELAVIL) tablet, 25 mg, Oral, NIGHTLY, Siemon, Kristin, PA-C, 25 mg at 03/09/24 2044    atorvastatin  (LIPITOR) tablet, 40 mg, Oral, Daily, Siemon, Kristin, PA-C, 40 mg at 03/09/24 2044    budesonide -formoterol  (SYMBICORT ) 160 mcg-4.5 mcg per inhalation oral inhaler - Nursing to administer, 2 Puff, Inhalation, 2x/day, Siemon, Kristin, PA-C, 2 Puff at 03/10/24 9241    Correction/SSIP insulin  lispro 100 units/mL injection pen, 2-9 Units, Subcutaneous, 4x/day AC, Flinn, Christine, MD, 3 Units at 03/10/24 0757    cyclobenzaprine  (FLEXERIL ) tablet, 10 mg, Oral, Q8H PRN, Siemon, Kristin, PA-C, 10 mg at 03/09/24 1406    D10W bolus infusion 250 mL, 250 mL, Intravenous, Q1H PRN, Flinn, Christine, MD    dextrose  (GLUTOSE) 40% oral gel, 15 g, Oral, Q15 Min PRN, Flinn, Christine, MD    dextrose  50% (0.5 g/mL) injection - syringe, 12.5 g, Intravenous, Q15 Min PRN, Flinn, Christine, MD    docusate sodium (COLACE) capsule, 100 mg, Oral, 2x/day  PRN,  Siemon, Kristin, PA-C    famotidine (PEPCID) tablet, 20 mg, Oral, 2x/day PRN, Siemon, Kristin, PA-C    gabapentin  (NEURONTIN ) capsule, 600 mg, Oral, 3x/day, Siemon, Kristin, PA-C, 600 mg at 03/10/24 0759    glucagon injection 1 mg, 1 mg, IntraMUSCULAR, Once PRN, Flinn, Christine, MD    heparin 5,000 unit/mL injection, 5,000 Units, Subcutaneous, 2x/day, Siemon, Kristin, PA-C, 5,000 Units at 03/10/24 0759    HYDROmorphone (DILAUDID) 1 mg/mL injection, 1 mg, Intravenous, Q2H PRN, Siemon, Kristin, PA-C, 1 mg at 03/10/24 0802    magnesium hydroxide (MILK OF MAGNESIA) 400mg  per 5mL oral liquid, 15 mL, Oral, Q6H PRN, Siemon, Kristin, PA-C    melatonin tablet, 6 mg, Oral, NIGHTLY, Flinn, Christine, MD, 6 mg at 03/09/24 2044    naloxone (NARCAN) 0.4 mg/mL injection, 0.4 mg, Intravenous, Q2 MIN PRN, Siemon, Kristin, PA-C    NS flush syringe, 3 mL, Intracatheter, Q8HRS, Siemon, Kristin, PA-C, 3 mL at 03/09/24 2200    NS flush syringe, 3 mL, Intracatheter, Q1H PRN, Siemon, Kristin, PA-C    NS flush syringe, 3 mL, Intracatheter, Q8HRS, Siemon, Kristin, PA-C, 3 mL at 03/09/24 2200    NS flush syringe, 3 mL, Intracatheter, Q1H PRN, Siemon, Kristin, PA-C    ondansetron  (ZOFRAN ) 2 mg/mL injection, 4 mg, Intravenous, Q6H PRN, Siemon, Kristin, PA-C, 4 mg at 03/07/24 2255    oxyCODONE  (ROXICODONE ) immediate release tablet, 15 mg, Oral, Q4H PRN, Fraser Camellia BROCKS, MD, 15 mg at 03/10/24 0557    oxyCODONE  (ROXICODONE ) immediate release tablet, 10 mg, Oral, Q4H PRN, Fraser Camellia BROCKS, MD, 10 mg at 03/10/24 1107    pantoprazole  (PROTONIX ) delayed release tablet, 40 mg, Oral, Daily, Siemon, Kristin, PA-C, 40 mg at 03/10/24 0557    polyethylene glycol (MIRALAX) oral packet, 17 g, Oral, Daily, Flinn, Christine, MD    prochlorperazine (COMPAZINE) 5 mg/mL injection, 10 mg, Intravenous, Q6H PRN **OR** prochlorperazine (COMPAZINE) tablet, 10 mg, Oral, Q6H PRN, Siemon, Kristin, PA-C

## 2024-03-10 NOTE — Care Plan (Signed)
 Problem: Wound  Goal: Optimal Coping  Outcome: Ongoing (see interventions/notes)  Goal: Optimal Functional Ability  Outcome: Ongoing (see interventions/notes)  Goal: Absence of Infection Signs and Symptoms  Outcome: Ongoing (see interventions/notes)  Goal: Improved Oral Intake  Outcome: Ongoing (see interventions/notes)  Goal: Optimal Pain Control and Function  Outcome: Ongoing (see interventions/notes)  Goal: Skin Health and Integrity  Outcome: Ongoing (see interventions/notes)  Intervention: Optimize Skin Protection  Recent Flowsheet Documentation  Taken 03/10/2024 1000 by Drake HERO, RN  Pressure Reduction Techniques: Mobility is maximized  Pressure Reduction Devices: Specialty bed/mattress utilized  Goal: Optimal Wound Healing  Outcome: Ongoing (see interventions/notes)  Intervention: Promote Wound Healing  Recent Flowsheet Documentation  Taken 03/10/2024 1000 by Drake HERO, RN  Pressure Reduction Techniques: Mobility is maximized  Pressure Reduction Devices: Specialty bed/mattress utilized     Problem: Adult Inpatient Plan of Care  Goal: Plan of Care Review  Outcome: Ongoing (see interventions/notes)  Goal: Patient-Specific Goal (Individualized)  Outcome: Ongoing (see interventions/notes)  Goal: Absence of Hospital-Acquired Illness or Injury  Outcome: Ongoing (see interventions/notes)  Intervention: Identify and Manage Fall Risk  Recent Flowsheet Documentation  Taken 03/10/2024 1000 by Drake HERO, RN  Safety Promotion/Fall Prevention:   activity supervised   fall prevention program maintained   nonskid shoes/slippers when out of bed   toileting scheduled  Intervention: Prevent Skin Injury  Recent Flowsheet Documentation  Taken 03/10/2024 1000 by Drake HERO, RN  Skin Protection: adhesive use limited  Intervention: Prevent and Manage VTE (Venous Thromboembolism) Risk  Recent Flowsheet Documentation  Taken 03/10/2024 1000 by Drake HERO, RN  Sequential Compression Device (SCDs): Sequential compression device on  VTE  Prevention/Management: ambulation promoted  Intervention: Prevent Infection  Recent Flowsheet Documentation  Taken 03/10/2024 1000 by Drake HERO, RN  Infection Prevention: promote handwashing  Goal: Optimal Comfort and Wellbeing  Outcome: Ongoing (see interventions/notes)  Goal: Rounds/Family Conference  Outcome: Ongoing (see interventions/notes)     Problem: Skin Injury Risk Increased  Goal: Skin Health and Integrity  Outcome: Ongoing (see interventions/notes)  Intervention: Optimize Skin Protection  Recent Flowsheet Documentation  Taken 03/10/2024 1000 by Drake HERO, RN  Pressure Reduction Techniques: Mobility is maximized  Pressure Reduction Devices: Specialty bed/mattress utilized  Skin Protection: adhesive use limited     Problem: Health Knowledge, Opportunity to Enhance (Adult,Obstetrics,Pediatric)  Goal: Knowledgeable about Health Subject/Topic  Description: Patient will demonstrate the desired outcomes by discharge/transition of care.  Outcome: Ongoing (see interventions/notes)     Problem: Pain Acute  Goal: Optimal Pain Control and Function  Outcome: Ongoing (see interventions/notes)     Problem: Spinal Surgery  Goal: Optimal Coping with Surgery  Outcome: Ongoing (see interventions/notes)  Goal: Absence of Bleeding  Outcome: Ongoing (see interventions/notes)  Goal: Effective Bowel Elimination  Outcome: Ongoing (see interventions/notes)  Goal: Fluid and Electrolyte Balance  Outcome: Ongoing (see interventions/notes)  Goal: Optimal Functional Ability  Outcome: Ongoing (see interventions/notes)  Goal: Absence of Infection Signs and Symptoms  Outcome: Ongoing (see interventions/notes)  Intervention: Prevent or Manage Infection  Recent Flowsheet Documentation  Taken 03/10/2024 1000 by Drake HERO, RN  Infection Prevention: promote handwashing  Goal: Optimal Neurologic Function  Outcome: Ongoing (see interventions/notes)  Intervention: Optimize Neurologic Function  Recent Flowsheet Documentation  Taken 03/10/2024 1000 by Drake HERO, RN  Pressure Reduction Devices: Specialty bed/mattress utilized  Goal: Anesthesia/Sedation Recovery  Outcome: Ongoing (see interventions/notes)  Intervention: Optimize Anesthesia Recovery  Recent Flowsheet Documentation  Taken 03/10/2024 1000 by Drake HERO, RN  Safety Promotion/Fall Prevention:  activity supervised   fall prevention program maintained   nonskid shoes/slippers when out of bed   toileting scheduled  Goal: Optimal Pain Control and Function  Outcome: Ongoing (see interventions/notes)  Goal: Nausea and Vomiting Relief  Outcome: Ongoing (see interventions/notes)  Goal: Effective Urinary Elimination  Outcome: Ongoing (see interventions/notes)  Goal: Effective Oxygenation and Ventilation  Outcome: Ongoing (see interventions/notes)  RN reviewed POC with pt at bedside.  Pt will be discharged once MD discharges from MD standpoint.  Pt will be discharged to IRU versus rehab.

## 2024-03-10 NOTE — Consults (Signed)
 [facility]  Internal Medicine Consultation  Date of Service:  03/10/2024  Steve Young, Steve Young, 63 y.o. male  Date of Admission:  03/07/2024  Date of Birth:  20-Feb-1961    Reason for Consultation: Perioperative Medical Management    Assessment and Plan:  This is a 55 yr M patient who presented to [facility] on 10/22 for planned spine surgery. Significant PMH Cervical Spine Stenosis, T2DM, HTN, HLD, MASLD, Asthma, GERD, Vitamin D  Deficiency. Patient underwent C7-T Posterior Decompression and Fusion on 10/22 without complications. Anticipate patient to be medically ready for discharge in 1-2 days if meeting postoperative goals. PT/OT recommend IRU. Plan is for patient to return home with home health.    Cervical Spine Stenosis s/p C7-T Posterior Decompression and Fusion on 10/22  -further management per primary team  -PT/OT consult  -continue Elavil qhs and Gabapentin . Hold home Ibuprofen .    Leukocytosis  -most likely reactive to above  -afebrile  -monitor CBC    Anemia  -mild expected postoperative blood loss  -Hgb baseline is 14. Hgb currently stable 12-13  -monitor CBC    Asthma  -stable on RA  -continue home inhaler  -aggressive pulmonary toilet, nebs PRN    T2DM  MASLD  -hold home Ozempic   -conservative SSI  -diabetic diet    HTN  -patient not currently on any antihypertensive medications    HLD  -continue Lipitor    GERD  -continue PPI    Hold home Aspirin     DVT Prophylaxis: per primary team  Full Code  Disposition: Home with Home Health    Subjective:    Patient reports continued postoperative incisional pain. He states that he may return home tomorrow.      acetaminophen  (TYLENOL ) tablet, 650 mg, Oral, Q4H PRN  albuterol  (PROVENTIL ) 2.5 mg / 3 mL (0.083%) neb solution, 2.5 mg, Nebulization, 4x/day PRN  albuterol  90 mcg per inhalation oral inhaler - Nursing to administer, 1 Puff, Inhalation, Q4H PRN  amitriptyline (ELAVIL) tablet, 25 mg, Oral, NIGHTLY  atorvastatin  (LIPITOR) tablet, 40 mg,  Oral, Daily  budesonide -formoterol  (SYMBICORT ) 160 mcg-4.5 mcg per inhalation oral inhaler - Nursing to administer, 2 Puff, Inhalation, 2x/day  Correction/SSIP insulin  lispro 100 units/mL injection pen, 2-9 Units, Subcutaneous, 4x/day AC  cyclobenzaprine  (FLEXERIL ) tablet, 10 mg, Oral, Q8H PRN  D10W bolus infusion 250 mL, 250 mL, Intravenous, Q1H PRN  dextrose  (GLUTOSE) 40% oral gel, 15 g, Oral, Q15 Min PRN  dextrose  50% (0.5 g/mL) injection - syringe, 12.5 g, Intravenous, Q15 Min PRN  docusate sodium (COLACE) capsule, 100 mg, Oral, 2x/day PRN  famotidine (PEPCID) tablet, 20 mg, Oral, 2x/day PRN  gabapentin  (NEURONTIN ) capsule, 600 mg, Oral, 3x/day  glucagon injection 1 mg, 1 mg, IntraMUSCULAR, Once PRN  heparin 5,000 unit/mL injection, 5,000 Units, Subcutaneous, 2x/day  HYDROmorphone (DILAUDID) 1 mg/mL injection, 1 mg, Intravenous, Q2H PRN  magnesium hydroxide (MILK OF MAGNESIA) 400mg  per 5mL oral liquid, 15 mL, Oral, Q6H PRN  melatonin tablet, 6 mg, Oral, NIGHTLY  naloxone (NARCAN) 0.4 mg/mL injection, 0.4 mg, Intravenous, Q2 MIN PRN  NS flush syringe, 3 mL, Intracatheter, Q8HRS  NS flush syringe, 3 mL, Intracatheter, Q1H PRN  NS flush syringe, 3 mL, Intracatheter, Q8HRS  NS flush syringe, 3 mL, Intracatheter, Q1H PRN  ondansetron  (ZOFRAN ) 2 mg/mL injection, 4 mg, Intravenous, Q6H PRN  oxyCODONE  (ROXICODONE ) immediate release tablet, 15 mg, Oral, Q4H PRN  oxyCODONE  (ROXICODONE ) immediate release tablet, 10 mg, Oral, Q4H PRN  pantoprazole  (PROTONIX ) delayed release tablet, 40 mg, Oral, Daily  polyethylene glycol (MIRALAX) oral packet, 17 g, Oral, Daily  prochlorperazine (COMPAZINE) 5 mg/mL injection, 10 mg, Intravenous, Q6H PRN   Or  prochlorperazine (COMPAZINE) tablet, 10 mg, Oral, Q6H PRN      Physical Exam:    Temperature: 36.8 C (98.2 F) Heart Rate: 74 BP (Non-Invasive): (!) 143/78   Respiratory Rate: 18 SpO2: 95 %       Exam:  Nursing note and vitals reviewed.   General: 63 y.o. male, appears stated age,  in no acute distress, sitting upright in bed  Eyes: PERRLA. EOM intact. Clear conjunctivae.  HENT: NCAT. Oral and nasal mucosa moist without erythema or exudate.   Neck: +C Collar and JP drain  CV: Regular rate and rhythm without murmurs  Lungs: Clear to auscultation bilaterally. No wheezes, crackles or rhonchi. Comfortable work of breathing on RA.  Abdomen: Soft, non-tender, non-distended, bowel sounds present  Neuro: Alert and answers questions appropriately. No apparent focal neurological deficits.  Musculoskeletal: Grossly normal limb movements without obvious deformity  Extremities: No peripheral edema  Skin:Warm and dry. No visible rashes or lesions.   Psych: Behavior, speech and affect are appropriate.     Labs:     Results for orders placed or performed during the hospital encounter of 03/07/24 (from the past 24 hours)   POC BLOOD GLUCOSE (RESULTS)   Result Value Ref Range    GLUCOSE, POC 168 (H) 70 - 100 mg/dl   POC BLOOD GLUCOSE (RESULTS)   Result Value Ref Range    GLUCOSE, POC 246 (H) 70 - 100 mg/dl   POC BLOOD GLUCOSE (RESULTS)   Result Value Ref Range    GLUCOSE, POC 217 (H) 70 - 100 mg/dl   PHOSPHORUS   Result Value Ref Range    PHOSPHORUS 2.8 2.3 - 4.0 mg/dL   MAGNESIUM   Result Value Ref Range    MAGNESIUM 2.1 1.8 - 2.6 mg/dL   BASIC METABOLIC PANEL   Result Value Ref Range    SODIUM 139 136 - 145 mmol/L    POTASSIUM 4.4 3.5 - 5.1 mmol/L    CHLORIDE 103 96 - 111 mmol/L    CO2 TOTAL 31 23 - 31 mmol/L    ANION GAP 5 4 - 13 mmol/L    CALCIUM 9.5 8.6 - 10.3 mg/dL    GLUCOSE 861 (H) 65 - 125 mg/dL    BUN 13 8 - 25 mg/dL    CREATININE 9.18 9.24 - 1.35 mg/dL    eGFRcr - MALE >09 >=39 mL/min/1.71m^2    BUN/CREA RATIO 16 6 - 22   CBC WITH DIFF   Result Value Ref Range    WBC 15.4 (H) 3.7 - 11.0 x10^3/uL    RBC 4.29 (L) 4.50 - 6.10 x10^6/uL    HGB 12.5 (L) 13.4 - 17.5 g/dL    HCT 62.5 (L) 61.0 - 52.0 %    MCV 87.2 78.0 - 100.0 fL    MCH 29.1 26.0 - 32.0 pg    MCHC 33.4 31.0 - 35.5 g/dL    RDW-CV 86.7 88.4 -  15.5 %    PLATELETS 219 150 - 400 x10^3/uL    MPV 11.3 8.7 - 12.5 fL    NEUTROPHIL % 88.4 %    LYMPHOCYTE % 6.6 %    MONOCYTE % 4.4 %    EOSINOPHIL % 0.0 %    BASOPHIL % 0.1 %    NEUTROPHIL # 13.57 (H) 1.50 - 7.70 x10^3/uL    LYMPHOCYTE # 1.01 1.00 -  4.80 x10^3/uL    MONOCYTE # 0.67 0.20 - 1.10 x10^3/uL    EOSINOPHIL # <0.10 <=0.50 x10^3/uL    BASOPHIL # <0.10 <=0.20 x10^3/uL    IMMATURE GRANULOCYTE % 0.5 0.0 - 1.0 %    IMMATURE GRANULOCYTE # <0.10 <0.10 x10^3/uL   POC BLOOD GLUCOSE (RESULTS)   Result Value Ref Range    GLUCOSE, POC 124 (H) 70 - 100 mg/dl       Imaging Studies:    Results for orders placed or performed during the hospital encounter of 03/07/24   XR SINGLE SPINE VIEW     Status: None    Narrative    Jonh TROY RITA  Male, 63 years old.    XR SINGLE SPINE VIEW performed on 03/07/2024 9:10 AM.    REASON FOR EXAM:  C7-T1 foraminotomy, C7-T1 foraminotomy     TECHNIQUE: 1 views/1 images submitted for interpretation.    COMPARISON: Cervical spine radiograph 02/15/2024      Impression    FINDINGS/IMPRESSION:  Single lateral intraoperative view of the cervical spine is submitted. There is redemonstration of anterior fusion hardware overlying C3, C4, and C5. There appears to be instrumentation posteriorly at the level of of C5-C6.          Radiologist location ID: [id number]     FLUORO O-ARM IN OR     Status: None    Narrative    *Procedure not read by radiology.    *Please Refer to Procedure Note for result.     Wanda Nyhan, MD

## 2024-03-10 NOTE — OT Treatment (Signed)
 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.1]     Treatment Minutes: 14     Precautions/Restrictions: FULL CODE: ATTEMPT RESUSCITATION/CPR, Fall risk, Spinal precautions: Logroll only, no twisting or bending, no lifting over 10 lbs, Brace on at all times: Systems Developer    Medical Lines: None     Respiratory Status: Room air   Patient Effort: Good   Symptoms Noted During/After Session: Increased pain      Pretreatment status: Supine in bed     Mental Status: Alert and Oriented   Behavior/Mood Observations: Behavior appropriate to situation, WNL/WFL   Follows Commands: Follows directions     Numeric pain scale:  10/10 neck/shoulder blades      Skin integrity: Not assessed     Areas of functional mobility addressed:     Assistive devices used with bed mobility:  Bed rails     Supine - Sit:  Contact Guard Assist     Sit - Supine: Contact Guard Assist     Sit - stand:  Moderate Assist and 2 person assist required to Min A      Bed - Chair/ Chair - bed:  Minimum Assist     Balance while sitting STATIC:  Fair    DYNAMIC: Fair-      Balance while standing STATIC: Poor+   DYNAMIC: Poor+      ADL's Addressed:     LB Dressing: Supervision     Post Treatment Position: Supine in bed Call light within reach, Telephone within reach, and Sitter select activated     Clinical Impression:    Assessment: Pt was pleasant and cooperative. Cues for breathing techniques completing transfers to reduce pain level. Required Mod A x2 for first STS from EOB and Min A x1 for second STS from w/c level. Cues for proper hand placement. Education provided on figure-four to don/doff sock with SPV provided. Recommendation for further skilled OT services at IRF.        Anticipated Discharge Disposition: Inpatient rehab facility      Jeoffrey LOISE Re, OT

## 2024-03-10 NOTE — PT Treatment (Signed)
 [address] [place], [place], [address]  ([phone]   (Fax) [phone]    REHABILITATION SERVICES  Physical Therapy Treatment Note     03/10/24     Patient:  Steve Young   MR Number:  [id number]   Date of Birth: Feb 07, 1961   Admitting Diagnosis: Cervical stenosis of spinal canal [M48.02]  S/P cervical spinal fusion [Z98.1]     Treatment Minutes: 18     Precautions/Restrictions: FULL CODE: ATTEMPT RESUSCITATION/CPR, Fall risk, Spinal precautions, Brace on at all times: Aspen collar    Medical Lines/Equipment: JP drain     Respiratory Status: Room air   Patient Effort: Adequate   Symptoms Noted During/After Session: fatigue, increased pain     Prior Level of Function: Independent with no assistive device    Living Environment: Lives with his uncle, whom the patient cares for   Home Set-up : One story with basement (uncle lines in basement)   Steps: Exterior: 1 step(s) with no handrails                Interior: There are stairs to the basement but p doesn't need to go downstairs     Mental Status: Alert   Behavior/Mood Observations: Behavior appropriate to situation, WNL/WFL   Follows Commands: Follows directions     Pretreatment status: Patient in bed     Comment: [name] is agreeable, declines sitting in chair at end of session.     Numeric pain scale: 10/10 neck and B shoulder pain with pain extending into the R arm     Skin integrity: Not Assessed       Vital Signs: Not Assessed       Areas of functional mobility addressed:     Assistive devices used with bed mobility:  bed rails and head of bed elevated     Rolling in bed: SBA     Supine - Sit:  Contact Guard Assist     Sit - Supine: Contact Guard Assist     Sit - stand:  mod assist x 2 initially, otherwise min assist      Bed - Chair/ Chair - bed:  Minimum Assist     Balance while sitting STATIC:  Fair    DYNAMIC: Fair-      Balance while standing STATIC: Fair-   DYNAMIC: Poor+     Comments:       Areas of gait training assessed:        Ambulation distance:  50 feet x 2     Level of assistance:  Minimum Assist     Assistive devices:  Wheeled Walker     Comments: increased B knee flexion noted with stance phase, relies on UEs heavily with ww      Post Treatment Position: Patient in bed Call light within reach, Telephone within reach, and Sitter select activated     Clinical Impression:    Assessment: [name] requires mod assist x 2 to stand initially, otherwise requires min assist for remainder of transfers, gait. He relies heavily on ww during gait, demos excessive knee flexion with B stance phase. Continue to recommend IRF as he does not have support at home, needs inpatient rehab in order to meet his goals of mod IND.         Anticipated Discharge Disposition: Inpatient rehab facility        [facility], [place]  03/10/2024, 10:40

## 2024-03-10 NOTE — Care Plan (Signed)
 Problem: Wound  Goal: Skin Health and Integrity  Intervention: Optimize Skin Protection  Recent Flowsheet Documentation  Taken 03/10/2024 2006 by Marvina POUR, LPN  Pressure Reduction Techniques: Frequent weight shifting encouraged  Pressure Reduction Devices: Repositioning wedges/pillows utilized     Problem: Adult Inpatient Plan of Care  Goal: Absence of Hospital-Acquired Illness or Injury  Intervention: Identify and Manage Fall Risk  Recent Flowsheet Documentation  Taken 03/10/2024 2006 by Marvina POUR, LPN  Safety Promotion/Fall Prevention:   activity supervised   nonskid shoes/slippers when out of bed   safety round/check completed     Problem: Adult Inpatient Plan of Care  Goal: Absence of Hospital-Acquired Illness or Injury  Intervention: Prevent and Manage VTE (Venous Thromboembolism) Risk  Recent Flowsheet Documentation  Taken 03/10/2024 2006 by Marvina POUR, LPN  Sequential Compression Device (SCDs): Sequential compression device off  VTE Prevention/Management: anticoagulant therapy maintained       Vong Garringer, LPN

## 2024-03-11 LAB — CBC WITH DIFF
BASOPHIL #: <0.1 x10ˆ3/uL (ref <=0.20)
BASOPHIL %: 0.3 %
EOSINOPHIL #: 0.17 x10ˆ3/uL (ref <=0.50)
EOSINOPHIL %: 1.3 %
HCT: 36.4 % — ABNORMAL LOW (ref 38.9–52.0)
HGB: 11.7 g/dL — ABNORMAL LOW (ref 13.4–17.5)
IMMATURE GRANULOCYTE #: <0.1 x10ˆ3/uL (ref <0.10)
IMMATURE GRANULOCYTE %: 0.5 % (ref 0.0–1.0)
LYMPHOCYTE #: 3.3 x10ˆ3/uL (ref 1.00–4.80)
LYMPHOCYTE %: 25.7 %
MCH: 28.7 pg (ref 26.0–32.0)
MCHC: 32.1 g/dL (ref 31.0–35.5)
MCV: 89.4 fL (ref 78.0–100.0)
MONOCYTE #: 0.8 x10ˆ3/uL (ref 0.20–1.10)
MONOCYTE %: 6.2 %
MPV: 11.1 fL (ref 8.7–12.5)
NEUTROPHIL #: 8.45 x10ˆ3/uL — ABNORMAL HIGH (ref 1.50–7.70)
NEUTROPHIL %: 66 %
PLATELETS: 218 x10ˆ3/uL (ref 150–400)
RBC: 4.07 x10ˆ6/uL — ABNORMAL LOW (ref 4.50–6.10)
RDW-CV: 13.2 % (ref 11.5–15.5)
WBC: 12.8 x10ˆ3/uL — ABNORMAL HIGH (ref 3.7–11.0)

## 2024-03-11 LAB — BASIC METABOLIC PANEL
ANION GAP: 5 mmol/L (ref 4–13)
BUN/CREA RATIO: 16 (ref 6–22)
BUN: 16 mg/dL (ref 8–25)
CALCIUM: 9.1 mg/dL (ref 8.6–10.3)
CHLORIDE: 103 mmol/L (ref 96–111)
CO2 TOTAL: 34 mmol/L — ABNORMAL HIGH (ref 23–31)
CREATININE: 0.98 mg/dL (ref 0.75–1.35)
GLUCOSE: 110 mg/dL (ref 65–125)
POTASSIUM: 4.3 mmol/L (ref 3.5–5.1)
SODIUM: 142 mmol/L (ref 136–145)
eGFRcr - MALE: 86 mL/min/1.73mˆ2 (ref >=60)

## 2024-03-11 LAB — POC BLOOD GLUCOSE (RESULTS)
GLUCOSE, POC: 121 mg/dL — ABNORMAL HIGH (ref 70–100)
GLUCOSE, POC: 116 mg/dL — ABNORMAL HIGH (ref 70–100)
GLUCOSE, POC: 131 mg/dL — ABNORMAL HIGH (ref 70–100)
GLUCOSE, POC: 167 mg/dL — ABNORMAL HIGH (ref 70–100)

## 2024-03-11 LAB — MAGNESIUM: MAGNESIUM: 2.2 mg/dL (ref 1.8–2.6)

## 2024-03-11 LAB — PHOSPHORUS: PHOSPHORUS: 3.6 mg/dL (ref 2.3–4.0)

## 2024-03-11 MED ORDER — DEXAMETHASONE SODIUM PHOSPHATE 4 MG/ML INJECTION SOLUTION
4.0000 mg | Freq: Four times a day (QID) | INTRAMUSCULAR | Status: DC
Start: 2024-03-11 — End: 2024-03-12
  Administered 2024-03-11 – 2024-03-12 (×4): 4 mg via INTRAVENOUS
  Administered 2024-03-12: 0 mg via INTRAVENOUS
  Filled 2024-03-11 (×7): qty 1

## 2024-03-11 MED ORDER — GABAPENTIN 400 MG CAPSULE
800.0000 mg | ORAL_CAPSULE | Freq: Three times a day (TID) | ORAL | Status: DC
Start: 2024-03-11 — End: 2024-03-12
  Administered 2024-03-11 – 2024-03-12 (×3): 800 mg via ORAL
  Filled 2024-03-11 (×4): qty 2

## 2024-03-11 NOTE — Care Plan (Signed)
 Problem: Skin Injury Risk Increased  Goal: Skin Health and Integrity  Outcome: Ongoing (see interventions/notes)     Problem: Health Knowledge, Opportunity to Enhance (Adult,Obstetrics,Pediatric)  Goal: Knowledgeable about Health Subject/Topic  Description: Patient will demonstrate the desired outcomes by discharge/transition of care.  Outcome: Ongoing (see interventions/notes)     Problem: Pain Acute  Goal: Optimal Pain Control and Function  Outcome: Ongoing (see interventions/notes)       POC reviewed with patient at bedside.  Belvia Gravel, RN

## 2024-03-11 NOTE — Consults (Signed)
 [facility]  Internal Medicine Consultation  Date of Service:  03/11/2024  Steve Young, Steve Young, 63 y.o. male  Date of Admission:  03/07/2024  Date of Birth:  07-Sep-1960    Reason for Consultation: Perioperative Medical Management    Assessment and Plan:  This is a 6 yr M patient who presented to [facility] on 10/22 for planned spine surgery. Significant PMH Cervical Spine Stenosis, T2DM, HTN, HLD, MASLD, Asthma, GERD, Vitamin D  Deficiency. Patient underwent C7-T Posterior Decompression and Fusion on 10/22 without complications. Anticipate patient to be medically ready for discharge today vs tomorrow. PT/OT recommend IRU.     Cervical Spine Stenosis s/p C7-T Posterior Decompression and Fusion on 10/22  -further management per primary team  -PT/OT consult  -continue Elavil qhs and Gabapentin . Hold home Ibuprofen .    Leukocytosis - Improving  -most likely reactive to above  -afebrile  -monitor CBC    Anemia  -mild expected postoperative blood loss  -Hgb baseline is 14. Hgb currently stable 12-13  -monitor CBC    Asthma  -stable on RA  -continue home inhaler  -aggressive pulmonary toilet, nebs PRN    T2DM  MASLD  -hold home Ozempic   -conservative SSI  -diabetic diet    HTN  -patient not currently on any antihypertensive medications    HLD  -continue Lipitor    GERD  -continue PPI    Hold home Aspirin     DVT Prophylaxis: per primary team  Full Code  Disposition: TBD    Subjective:    Patient continues to report significant incisional postoperative pain today. He states that he has been able to walk around his room and perform ADLs. Tolerating regular diet and voiding appropriately.      acetaminophen  (TYLENOL ) tablet, 650 mg, Oral, Q4H PRN  albuterol  (PROVENTIL ) 2.5 mg / 3 mL (0.083%) neb solution, 2.5 mg, Nebulization, 4x/day PRN  albuterol  90 mcg per inhalation oral inhaler - Nursing to administer, 1 Puff, Inhalation, Q4H PRN  amitriptyline (ELAVIL) tablet, 25 mg, Oral, NIGHTLY  atorvastatin   (LIPITOR) tablet, 40 mg, Oral, Daily  budesonide -formoterol  (SYMBICORT ) 160 mcg-4.5 mcg per inhalation oral inhaler - Nursing to administer, 2 Puff, Inhalation, 2x/day  Correction/SSIP insulin  lispro 100 units/mL injection pen, 2-9 Units, Subcutaneous, 4x/day AC  cyclobenzaprine  (FLEXERIL ) tablet, 10 mg, Oral, Q8H PRN  D10W bolus infusion 250 mL, 250 mL, Intravenous, Q1H PRN  dextrose  (GLUTOSE) 40% oral gel, 15 g, Oral, Q15 Min PRN  dextrose  50% (0.5 g/mL) injection - syringe, 12.5 g, Intravenous, Q15 Min PRN  docusate sodium (COLACE) capsule, 100 mg, Oral, 2x/day PRN  famotidine (PEPCID) tablet, 20 mg, Oral, 2x/day PRN  gabapentin  (NEURONTIN ) capsule, 600 mg, Oral, 3x/day  glucagon injection 1 mg, 1 mg, IntraMUSCULAR, Once PRN  heparin 5,000 unit/mL injection, 5,000 Units, Subcutaneous, 2x/day  HYDROmorphone (DILAUDID) 1 mg/mL injection, 1 mg, Intravenous, Q2H PRN  magnesium hydroxide (MILK OF MAGNESIA) 400mg  per 5mL oral liquid, 15 mL, Oral, Q6H PRN  melatonin tablet, 6 mg, Oral, NIGHTLY  naloxone (NARCAN) 0.4 mg/mL injection, 0.4 mg, Intravenous, Q2 MIN PRN  NS flush syringe, 3 mL, Intracatheter, Q8HRS  NS flush syringe, 3 mL, Intracatheter, Q1H PRN  NS flush syringe, 3 mL, Intracatheter, Q8HRS  NS flush syringe, 3 mL, Intracatheter, Q1H PRN  ondansetron  (ZOFRAN ) 2 mg/mL injection, 4 mg, Intravenous, Q6H PRN  oxyCODONE  (ROXICODONE ) immediate release tablet, 15 mg, Oral, Q4H PRN  oxyCODONE  (ROXICODONE ) immediate release tablet, 10 mg, Oral, Q4H PRN  pantoprazole  (PROTONIX ) delayed release tablet, 40 mg,  Oral, Daily  polyethylene glycol (MIRALAX) oral packet, 17 g, Oral, Daily  prochlorperazine (COMPAZINE) 5 mg/mL injection, 10 mg, Intravenous, Q6H PRN   Or  prochlorperazine (COMPAZINE) tablet, 10 mg, Oral, Q6H PRN      Physical Exam:    Temperature: (!) 35.6 C (96.1 F) Heart Rate: 66 BP (Non-Invasive): 136/87   Respiratory Rate: 18 SpO2: 90 %       Exam:  Nursing note and vitals reviewed.   General: 63 y.o.  male, appears stated age, in no acute distress, sitting upright in bed  Eyes: PERRLA. EOM intact. Clear conjunctivae.  HENT: NCAT. Oral and nasal mucosa moist without erythema or exudate.   Neck: +C Collar  CV: Regular rate and rhythm without murmurs  Lungs: Clear to auscultation bilaterally. No wheezes, crackles or rhonchi. Comfortable work of breathing on RA.  Abdomen: Soft, non-tender, non-distended, bowel sounds present  Neuro: Alert and answers questions appropriately. No apparent focal neurological deficits.  Musculoskeletal: Grossly normal limb movements without obvious deformity  Extremities: No peripheral edema  Skin:Warm and dry. No visible rashes or lesions.   Psych: Behavior, speech and affect are appropriate.     Labs:     Results for orders placed or performed during the hospital encounter of 03/07/24 (from the past 24 hours)   POC BLOOD GLUCOSE (RESULTS)   Result Value Ref Range    GLUCOSE, POC 171 (H) 70 - 100 mg/dl   POC BLOOD GLUCOSE (RESULTS)   Result Value Ref Range    GLUCOSE, POC 135 (H) 70 - 100 mg/dl   POC BLOOD GLUCOSE (RESULTS)   Result Value Ref Range    GLUCOSE, POC 121 (H) 70 - 100 mg/dl   PHOSPHORUS   Result Value Ref Range    PHOSPHORUS 3.6 2.3 - 4.0 mg/dL   MAGNESIUM   Result Value Ref Range    MAGNESIUM 2.2 1.8 - 2.6 mg/dL   BASIC METABOLIC PANEL   Result Value Ref Range    SODIUM 142 136 - 145 mmol/L    POTASSIUM 4.3 3.5 - 5.1 mmol/L    CHLORIDE 103 96 - 111 mmol/L    CO2 TOTAL 34 (H) 23 - 31 mmol/L    ANION GAP 5 4 - 13 mmol/L    CALCIUM 9.1 8.6 - 10.3 mg/dL    GLUCOSE 889 65 - 874 mg/dL    BUN 16 8 - 25 mg/dL    CREATININE 9.01 9.24 - 1.35 mg/dL    eGFRcr - MALE 86 >=39 mL/min/1.57m^2    BUN/CREA RATIO 16 6 - 22   CBC WITH DIFF   Result Value Ref Range    WBC 12.8 (H) 3.7 - 11.0 x10^3/uL    RBC 4.07 (L) 4.50 - 6.10 x10^6/uL    HGB 11.7 (L) 13.4 - 17.5 g/dL    HCT 63.5 (L) 61.0 - 52.0 %    MCV 89.4 78.0 - 100.0 fL    MCH 28.7 26.0 - 32.0 pg    MCHC 32.1 31.0 - 35.5 g/dL    RDW-CV  86.7 88.4 - 15.5 %    PLATELETS 218 150 - 400 x10^3/uL    MPV 11.1 8.7 - 12.5 fL    NEUTROPHIL % 66.0 %    LYMPHOCYTE % 25.7 %    MONOCYTE % 6.2 %    EOSINOPHIL % 1.3 %    BASOPHIL % 0.3 %    NEUTROPHIL # 8.45 (H) 1.50 - 7.70 x10^3/uL    LYMPHOCYTE # 3.30 1.00 -  4.80 x10^3/uL    MONOCYTE # 0.80 0.20 - 1.10 x10^3/uL    EOSINOPHIL # 0.17 <=0.50 x10^3/uL    BASOPHIL # <0.10 <=0.20 x10^3/uL    IMMATURE GRANULOCYTE % 0.5 0.0 - 1.0 %    IMMATURE GRANULOCYTE # <0.10 <0.10 x10^3/uL       Imaging Studies:    Results for orders placed or performed during the hospital encounter of 03/07/24   XR SINGLE SPINE VIEW     Status: None    Narrative    OZELL LANI PORTA  Male, 63 years old.    XR SINGLE SPINE VIEW performed on 03/07/2024 9:10 AM.    REASON FOR EXAM:  C7-T1 foraminotomy, C7-T1 foraminotomy     TECHNIQUE: 1 views/1 images submitted for interpretation.    COMPARISON: Cervical spine radiograph 02/15/2024      Impression    FINDINGS/IMPRESSION:  Single lateral intraoperative view of the cervical spine is submitted. There is redemonstration of anterior fusion hardware overlying C3, C4, and C5. There appears to be instrumentation posteriorly at the level of of C5-C6.          Radiologist location ID: [id number]     FLUORO O-ARM IN OR     Status: None    Narrative    *Procedure not read by radiology.    *Please Refer to Procedure Note for result.     Wanda Nyhan, MD

## 2024-03-11 NOTE — Progress Notes (Signed)
 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  He held off on any IV Dilaudid but the oral oxycodone  that would not appear to be keeping his pain under control.    Meds:  Current Medications[1]    Objective:  Filed Vitals:    03/10/24 1415 03/10/24 1926 03/10/24 2312 03/11/24 0615   BP: (!) 143/78 (!) 136/97 (!) 166/88 136/87   Pulse: 74 83 70 66   Resp: 18 18 18 18    Temp: 36.8 C (98.2 F) 36.4 C (97.5 F) 36.2 C (97.2 F) (!) 35.6 C (96.1 F)   SpO2: 95% 96% 96% 90%       Labs:  Lab Results   Component Value Date    WBC 12.8 (H) 03/11/2024    HGB 11.7 (L) 03/11/2024    HCT 36.4 (L) 03/11/2024    MCV 89.4 03/11/2024    PLTCNT 218 03/11/2024     Lab Results   Component Value Date    GLUCOSE 110 03/11/2024    CALCIUM 9.1 03/11/2024    SODIUM 142 03/11/2024    POTASSIUM 4.3 03/11/2024    CO2 34 (H) 03/11/2024    CHLORIDE 103 03/11/2024    BUN 16 03/11/2024    CREATININE 0.98 03/11/2024       General Physical Exam:  BP 136/87   Pulse 66   Temp (!) 35.6 C (96.1 F)   Resp 18   Ht 1.753 m (5' 9.02)   Wt 90.7 kg (199 lb 15.3 oz)   SpO2 90%   BMI 29.51 kg/m         Physical Exam:    The patient is afebrile. Vital signs are stable.  Dressing clean, dry and intact.     Neurological Examination:    Mental Status: Alert and oriented x3.     Right Upper Extremity:   Motor: 5/5 in all major muscle groups.   Sensory: Intact to light touch.   Reflexes: 2 in the extremity. Hoffman's signs are negative.  Left Upper Extremity:   Motor: 5/5 in all major  muscle groups.   Sensory: Intact to light touch.   Reflexes: 2 in the extremity. Hoffman's signs are negative.  Right Lower Extremity:   Motor: 5/5 in all major muscle groups.   Sensory: Intact to light touch.   Reflexes: 2 in the extremity. Clonus negative.  Left Lower Extremity:   Motor: 5/5 in all major muscle groups.   Sensory: Intact to light touch.   Reflexes: 2 in the extremity. Clonus negative.    Drain:   Output by Drain (mL) 03/09/24 0700 - 03/09/24 1859 03/09/24 1900 - 03/10/24 0659 03/10/24 0700 - 03/10/24 1859 03/10/24 1900 - 03/11/24 0659 03/11/24 0700 - 03/11/24 1355   Patient has no LDAs of requested type attached.       Labs:   CBC (Last 24 Hours):    Recent Results last 24 hours     03/11/24  0742   WBC 12.8*   HGB 11.7*  HCT 36.4*   MCV 89.4   PLTCNT 218         Imaging:         Problem List/ Diagnosis:  Problem List Items Addressed This Visit       * (Principal) Cervical stenosis of spinal canal - Primary    Relevant Orders    DME - WALKER Wheeled Walker; Walker Accessories: None    S/P cervical spinal fusion    Relevant Orders    DME - WALKER Wheeled Walker; Walker Accessories: None          Asessment/Plan:      63 y.o. year old male POD# 4 status post cervicothoracic fusion    My plans where it to send him home today however given the pain he is in I will discontinue the discharge order.  Instead, I have increase his Neurontin  to 800 mg t.i.d..  I will also give him IV Decadron 4 q.6 over the next 12 hours.  Dilaudid is ordered for breakthrough however my hope is that the Neurontin  and Decadron we will help give him enough relief to be discharged tomorrow.    -Afebrile. VSS. Labs Reviewed  -Pain Control    -TEDs/SCDs  -DVT Prophylaxis: Heparin 5000 units BID  -OOB with PT/OT  -Dispo-possible home tomorrow    Electronically signed by Raynell Sheen, MD  03/11/2024    This note has been generated by a word recognition computerized program. There may be grammatical, typographical, or word  substitution errors which have escaped my editorial review.         [1]   Current Facility-Administered Medications:     acetaminophen  (TYLENOL ) tablet, 650 mg, Oral, Q4H PRN, Siemon, Kristin, PA-C    albuterol  (PROVENTIL ) 2.5 mg / 3 mL (0.083%) neb solution, 2.5 mg, Nebulization, 4x/day PRN, Siemon, Kristin, PA-C    albuterol  90 mcg per inhalation oral inhaler - Nursing to administer, 1 Puff, Inhalation, Q4H PRN, Siemon, Kristin, PA-C    amitriptyline (ELAVIL) tablet, 25 mg, Oral, NIGHTLY, Siemon, Kristin, PA-C, 25 mg at 03/10/24 2006    atorvastatin  (LIPITOR) tablet, 40 mg, Oral, Daily, Siemon, Kristin, PA-C, 40 mg at 03/10/24 2006    budesonide -formoterol  (SYMBICORT ) 160 mcg-4.5 mcg per inhalation oral inhaler - Nursing to administer, 2 Puff, Inhalation, 2x/day, Siemon, Kristin, PA-C, 2 Puff at 03/11/24 9158    Correction/SSIP insulin  lispro 100 units/mL injection pen, 2-9 Units, Subcutaneous, 4x/day AC, Flinn, Christine, MD, 2 Units at 03/10/24 1630    cyclobenzaprine  (FLEXERIL ) tablet, 10 mg, Oral, Q8H PRN, Siemon, Kristin, PA-C, 10 mg at 03/09/24 1406    D10W bolus infusion 250 mL, 250 mL, Intravenous, Q1H PRN, Flinn, Christine, MD    dextrose  (GLUTOSE) 40% oral gel, 15 g, Oral, Q15 Min PRN, Flinn, Christine, MD    dextrose  50% (0.5 g/mL) injection - syringe, 12.5 g, Intravenous, Q15 Min PRN, Flinn, Christine, MD    docusate sodium (COLACE) capsule, 100 mg, Oral, 2x/day PRN, Siemon, Kristin, PA-C    famotidine (PEPCID) tablet, 20 mg, Oral, 2x/day PRN, Siemon, Kristin, PA-C    gabapentin  (NEURONTIN ) capsule, 600 mg, Oral, 3x/day, Siemon, Kristin, PA-C, 600 mg at 03/11/24 1326    glucagon injection 1 mg, 1 mg, IntraMUSCULAR, Once PRN, Flinn, Christine, MD    heparin 5,000 unit/mL injection, 5,000 Units, Subcutaneous, 2x/day, Siemon, Kristin, PA-C, 5,000 Units at 03/11/24 0842    HYDROmorphone (DILAUDID) 1 mg/mL injection, 1 mg, Intravenous, Q2H PRN, Siemon, Kristin, PA-C, 1 mg at 03/10/24 1214    magnesium  hydroxide (MILK OF MAGNESIA) 400mg  per 5mL oral liquid, 15 mL, Oral, Q6H PRN, Siemon, Kristin, PA-C    melatonin tablet, 6 mg, Oral, NIGHTLY, Flinn, Christine, MD, 6 mg at 03/10/24 2007    naloxone (NARCAN) 0.4 mg/mL injection, 0.4 mg, Intravenous, Q2 MIN PRN, Siemon, Kristin, PA-C    NS flush syringe, 3 mL, Intracatheter, Q8HRS, Siemon, Kristin, PA-C, 3 mL at 03/10/24 2005    NS flush syringe, 3 mL, Intracatheter, Q1H PRN, Siemon, Kristin, PA-C    NS flush syringe, 3 mL, Intracatheter, Q8HRS, Siemon, Kristin, PA-C, 3 mL at 03/10/24 2005    NS flush syringe, 3 mL, Intracatheter, Q1H PRN, Siemon, Kristin, PA-C    ondansetron  (ZOFRAN ) 2 mg/mL injection, 4 mg, Intravenous, Q6H PRN, Siemon, Kristin, PA-C, 4 mg at 03/07/24 2255    oxyCODONE  (ROXICODONE ) immediate release tablet, 15 mg, Oral, Q4H PRN, Fraser Camellia BROCKS, MD, 15 mg at 03/11/24 9371    oxyCODONE  (ROXICODONE ) immediate release tablet, 10 mg, Oral, Q4H PRN, Fraser Camellia BROCKS, MD, 10 mg at 03/11/24 1325    pantoprazole  (PROTONIX ) delayed release tablet, 40 mg, Oral, Daily, Siemon, Kristin, PA-C, 40 mg at 03/11/24 0842    polyethylene glycol (MIRALAX) oral packet, 17 g, Oral, Daily, Flinn, Christine, MD, 17 g at 03/11/24 0841    prochlorperazine (COMPAZINE) 5 mg/mL injection, 10 mg, Intravenous, Q6H PRN **OR** prochlorperazine (COMPAZINE) tablet, 10 mg, Oral, Q6H PRN, Siemon, Kristin, PA-C

## 2024-03-12 ENCOUNTER — Inpatient Hospital Stay (HOSPITAL_COMMUNITY): Payer: Self-pay

## 2024-03-12 LAB — BASIC METABOLIC PANEL
ANION GAP: 6 mmol/L (ref 4–13)
BUN/CREA RATIO: 14 (ref 6–22)
BUN: 12 mg/dL (ref 8–25)
CALCIUM: 9.2 mg/dL (ref 8.6–10.3)
CHLORIDE: 101 mmol/L (ref 96–111)
CO2 TOTAL: 33 mmol/L — ABNORMAL HIGH (ref 23–31)
CREATININE: 0.87 mg/dL (ref 0.75–1.35)
GLUCOSE: 145 mg/dL — ABNORMAL HIGH (ref 65–125)
POTASSIUM: 4.9 mmol/L (ref 3.5–5.1)
SODIUM: 140 mmol/L (ref 136–145)
eGFRcr - MALE: >90 mL/min/1.73mˆ2 (ref >=60)

## 2024-03-12 LAB — MAGNESIUM: MAGNESIUM: 2.2 mg/dL (ref 1.8–2.6)

## 2024-03-12 LAB — POC BLOOD GLUCOSE (RESULTS)
GLUCOSE, POC: 141 mg/dL — ABNORMAL HIGH (ref 70–100)
GLUCOSE, POC: 227 mg/dL — ABNORMAL HIGH (ref 70–100)
GLUCOSE, POC: 153 mg/dL — ABNORMAL HIGH (ref 70–100)

## 2024-03-12 LAB — CBC WITH DIFF
BASOPHIL #: <0.1 x10ˆ3/uL (ref <=0.20)
BASOPHIL %: 0.1 %
EOSINOPHIL #: <0.1 x10ˆ3/uL (ref <=0.50)
EOSINOPHIL %: 0.1 %
HCT: 39.1 % (ref 38.9–52.0)
HGB: 12.6 g/dL — ABNORMAL LOW (ref 13.4–17.5)
IMMATURE GRANULOCYTE #: <0.1 x10ˆ3/uL (ref <0.10)
IMMATURE GRANULOCYTE %: 0.6 % (ref 0.0–1.0)
LYMPHOCYTE #: 0.91 x10ˆ3/uL — ABNORMAL LOW (ref 1.00–4.80)
LYMPHOCYTE %: 6.6 %
MCH: 28.9 pg (ref 26.0–32.0)
MCHC: 32.2 g/dL (ref 31.0–35.5)
MCV: 89.7 fL (ref 78.0–100.0)
MONOCYTE #: 0.51 x10ˆ3/uL (ref 0.20–1.10)
MONOCYTE %: 3.7 %
MPV: 11.5 fL (ref 8.7–12.5)
NEUTROPHIL #: 12.28 x10ˆ3/uL — ABNORMAL HIGH (ref 1.50–7.70)
NEUTROPHIL %: 88.9 %
PLATELETS: 281 x10ˆ3/uL (ref 150–400)
RBC: 4.36 x10ˆ6/uL — ABNORMAL LOW (ref 4.50–6.10)
RDW-CV: 13.1 % (ref 11.5–15.5)
WBC: 13.8 x10ˆ3/uL — ABNORMAL HIGH (ref 3.7–11.0)

## 2024-03-12 LAB — PHOSPHORUS: PHOSPHORUS: 2.9 mg/dL (ref 2.3–4.0)

## 2024-03-12 MED ORDER — GABAPENTIN 400 MG CAPSULE
800.0000 mg | ORAL_CAPSULE | Freq: Three times a day (TID) | ORAL | 0 refills | Status: DC
Start: 2024-03-12 — End: 2024-04-09

## 2024-03-12 MED ORDER — METHYLPREDNISOLONE 4 MG TABLETS IN A DOSE PACK
ORAL_TABLET | ORAL | 0 refills | Status: AC
Start: 2024-03-12 — End: ?

## 2024-03-12 NOTE — Care Management Notes (Signed)
 Received a message from Kaitlyn from [facility] community health. They are offering a peer to peer [phone] option 5 to be called in by 03/12/2024 230pm.   .Tobias Loss, RN

## 2024-03-12 NOTE — Care Management Notes (Signed)
 The insurance company intends to deny admission to IRU.  A Peer-Peer is being offered by calling [phone] option 5 by 2:30pm today Monday 03/12/2024.  Name: Steve Young  DOB: [birth year]  ID#: [id number]  Ref#: [id number].  Vicenta Bend, SOCIAL WORKER

## 2024-03-12 NOTE — Progress Notes (Signed)
 [facility]  Spine Post Operative Note    Name: Steve Young   MRN: [id number]   Attending Physician: Fraser Camellia BROCKS, MD [phone] Day: 5    The patient is status post:  Status post cervicothoracic decompression fusion postop day 4    Pre-OP Diagnosis: Pre-Op Diagnosis Codes:      * Cervical stenosis of spinal canal [M48.02]   Surgery/Procedure: Procedure(s) (LRB):  C7-T1 RIGHT FORAMINOTOMY W/POSTERIOR FUSION (N/A)    Subjective:  Patient continues with right upper arm pain but improved from yesterday.  No symptoms on the left.  Up and ambulating around the room without difficulty.  Feels ready to go home    Meds:  Current Medications[1]    Objective:  Filed Vitals:    03/11/24 0615 03/11/24 2029 03/12/24 0115 03/12/24 0632   BP: 136/87 (!) 154/84 135/85 (!) 147/92   Pulse: 66 90 79 76   Resp: 18 18 18 18    Temp: (!) 35.6 C (96.1 F) 37.2 C (99 F) 36.8 C (98.2 F) (!) 35.9 C (96.6 F)   SpO2: 90% 95% 96% 94%       Labs:  Lab Results   Component Value Date    WBC 13.8 (H) 03/12/2024    HGB 12.6 (L) 03/12/2024    HCT 39.1 03/12/2024    MCV 89.7 03/12/2024    PLTCNT 281 03/12/2024     Lab Results   Component Value Date    GLUCOSE 145 (H) 03/12/2024    CALCIUM 9.2 03/12/2024    SODIUM 140 03/12/2024    POTASSIUM 4.9 03/12/2024    CO2 33 (H) 03/12/2024    CHLORIDE 101 03/12/2024    BUN 12 03/12/2024    CREATININE 0.87 03/12/2024       General Physical Exam:  BP (!) 147/92   Pulse 76   Temp (!) 35.9 C (96.6 F)   Resp 18   Ht 1.753 m (5' 9.02)   Wt 90.7 kg (199 lb 15.3 oz)   SpO2 94%   BMI 29.51 kg/m         Physical Exam:    The patient is afebrile. Vital signs are stable.  Dressing clean, dry and intact.     Neurological Examination:    Mental Status: Alert and oriented x3.     Right Upper Extremity:   Motor: 5/5 in all major muscle groups, with the exception of grip which is 4/5  Sensory: Intact to light touch.   Reflexes: 2 in the extremity. Hoffman's signs are negative.  Left  Upper Extremity:   Motor: 5/5 in all major muscle groups.   Sensory: Intact to light touch.   Reflexes: 2 in the extremity. Hoffman's signs are negative.      Labs:   CBC (Last 24 Hours):    Recent Results last 24 hours     03/12/24  0615   WBC 13.8*   HGB 12.6*   HCT 39.1   MCV 89.7   PLTCNT 281       Problem List/ Diagnosis:  Problem List Items Addressed This Visit          Musculoskeletal    * (Principal) Cervical stenosis of spinal canal - Primary    Relevant Orders    DME - WALKER Wheeled Walker; Walker Accessories: None       Other    S/P cervical spinal fusion    Relevant Orders    DME - WALKER Wheeled Walker; Walker Accessories:  None          Asessment/Plan:      63 y.o. year old male POD# 5 status post cervicothoracic fusion    -Afebrile. VSS. Labs Reviewed  -Pain Control  -TEDs/SCDs  -DVT Prophylaxis: Heparin 5000 units BID  -OOB with PT/OT  -Dispo-home today, f/u in clinic in 2 weeks    Allean Singleton PA-C  03/12/2024    Seen examined and agree with the above.  With coaching patient's strength in his right grip is 4+ out of 5.  No upper motor neuron signs.  Overall he is continuing to improve.  The pain in his arm is now up into his biceps area rather than forearm.  I think this should continue to improve with time.  We are going to send him home with oral steroids and the increased dose of his gabapentin .  I will plan to see him back in the office in 2 weeks.    Dr. Fraser    This note has been generated by a word recognition computerized program. There may be grammatical, typographical, or word substitution errors which have escaped my editorial review.           [1]   Current Facility-Administered Medications:     acetaminophen  (TYLENOL ) tablet, 650 mg, Oral, Q4H PRN, Siemon, Kristin, PA-C    albuterol  (PROVENTIL ) 2.5 mg / 3 mL (0.083%) neb solution, 2.5 mg, Nebulization, 4x/day PRN, Siemon, Kristin, PA-C    albuterol  90 mcg per inhalation oral inhaler - Nursing to administer, 1 Puff, Inhalation,  Q4H PRN, Siemon, Kristin, PA-C    amitriptyline (ELAVIL) tablet, 25 mg, Oral, NIGHTLY, Siemon, Kristin, PA-C, 25 mg at 03/11/24 2037    atorvastatin  (LIPITOR) tablet, 40 mg, Oral, Daily, Siemon, Kristin, PA-C, 40 mg at 03/11/24 2038    budesonide -formoterol  (SYMBICORT ) 160 mcg-4.5 mcg per inhalation oral inhaler - Nursing to administer, 2 Puff, Inhalation, 2x/day, Siemon, Kristin, PA-C, 2 Puff at 03/12/24 9180    Correction/SSIP insulin  lispro 100 units/mL injection pen, 2-9 Units, Subcutaneous, 4x/day AC, Flinn, Christine, MD, 2 Units at 03/11/24 2039    cyclobenzaprine  (FLEXERIL ) tablet, 10 mg, Oral, Q8H PRN, Siemon, Kristin, PA-C, 10 mg at 03/09/24 1406    D10W bolus infusion 250 mL, 250 mL, Intravenous, Q1H PRN, Flinn, Christine, MD    dexAMETHasone 4 mg/mL injection, 4 mg, Intravenous, Q6H, Aguilar, Pedro, MD, 4 mg at 03/12/24 0820    dextrose  (GLUTOSE) 40% oral gel, 15 g, Oral, Q15 Min PRN, Flinn, Christine, MD    dextrose  50% (0.5 g/mL) injection - syringe, 12.5 g, Intravenous, Q15 Min PRN, Flinn, Christine, MD    docusate sodium (COLACE) capsule, 100 mg, Oral, 2x/day PRN, Siemon, Kristin, PA-C    famotidine (PEPCID) tablet, 20 mg, Oral, 2x/day PRN, Siemon, Kristin, PA-C    gabapentin  (NEURONTIN ) capsule, 800 mg, Oral, 3x/day, Aguilar, Pedro, MD, 800 mg at 03/12/24 0819    glucagon injection 1 mg, 1 mg, IntraMUSCULAR, Once PRN, Flinn, Christine, MD    heparin 5,000 unit/mL injection, 5,000 Units, Subcutaneous, 2x/day, Siemon, Kristin, PA-C, 5,000 Units at 03/12/24 9180    HYDROmorphone (DILAUDID) 1 mg/mL injection, 1 mg, Intravenous, Q2H PRN, Siemon, Kristin, PA-C, 1 mg at 03/11/24 2141    magnesium hydroxide (MILK OF MAGNESIA) 400mg  per 5mL oral liquid, 15 mL, Oral, Q6H PRN, Siemon, Kristin, PA-C    melatonin tablet, 6 mg, Oral, NIGHTLY, Flinn, Christine, MD, 6 mg at 03/11/24 2038    naloxone (NARCAN) 0.4 mg/mL injection, 0.4 mg, Intravenous, Q2  MIN PRN, Siemon, Kristin, PA-C    NS flush syringe, 3 mL,  Intracatheter, Q8HRS, Siemon, Kristin, PA-C, 3 mL at 03/11/24 2033    NS flush syringe, 3 mL, Intracatheter, Q1H PRN, Siemon, Kristin, PA-C    NS flush syringe, 3 mL, Intracatheter, Q8HRS, Siemon, Kristin, PA-C, 3 mL at 03/11/24 2033    NS flush syringe, 3 mL, Intracatheter, Q1H PRN, Siemon, Kristin, PA-C    ondansetron  (ZOFRAN ) 2 mg/mL injection, 4 mg, Intravenous, Q6H PRN, Siemon, Kristin, PA-C, 4 mg at 03/07/24 2255    oxyCODONE  (ROXICODONE ) immediate release tablet, 15 mg, Oral, Q4H PRN, Fraser Camellia BROCKS, MD, 15 mg at 03/12/24 0818    oxyCODONE  (ROXICODONE ) immediate release tablet, 10 mg, Oral, Q4H PRN, Fraser Camellia BROCKS, MD, 10 mg at 03/11/24 1325    pantoprazole  (PROTONIX ) delayed release tablet, 40 mg, Oral, Daily, Siemon, Kristin, PA-C, 40 mg at 03/12/24 0820    polyethylene glycol (MIRALAX) oral packet, 17 g, Oral, Daily, Flinn, Christine, MD, 17 g at 03/12/24 0820    prochlorperazine (COMPAZINE) 5 mg/mL injection, 10 mg, Intravenous, Q6H PRN **OR** prochlorperazine (COMPAZINE) tablet, 10 mg, Oral, Q6H PRN, Siemon, Kristin, PA-C

## 2024-03-12 NOTE — Care Plan (Signed)
 Problem: Pain Acute  Goal: Optimal Pain Control and Function  Outcome: Ongoing (see interventions/notes)       POC reviewed with patient at bedside.  Jonni Sanger, RN

## 2024-03-12 NOTE — Discharge Instructions (Signed)
 Please review DC instructions

## 2024-03-12 NOTE — Care Management Notes (Addendum)
 [facility]  Care Management Note    Patient Name: Steve Young  Date of Birth: 23-Mar-1961  Sex: male  Date/Time of Admission: 03/07/2024  6:06 AM  Room/Bed: 1402/A  Payor: [place] MEDICARE / Plan: [place] MEDICARE DUAL COMPLETE / Product Type: MEDICARE MC /    LOS: 5 days   Primary Care Providers:  Lucinda Millman, MD, MD (General)    Admitting Diagnosis:  Cervical stenosis of spinal canal [M48.02]  S/P cervical spinal fusion [Z98.1]    Assessment:      03/12/24 1139   Assessment Details   Assessment Type Continued Assessment   Date of Care Management Update 03/12/24   Care Management Plan   Discharge Planning Status plan in progress   Projected Discharge Date 03/12/24   Discharge plan discussed with: Patient   Patient choice offered to patient/family Yes   Discharge Needs Assessment   Discharge Facility/Level of Care Needs Home with Home Health (code 6)         Discharge Plan:  Home with Home Health (code 6)  Pt aware auth to IRU denied. Service is aware a P2P will need completed. CM discussed same with patient. He states that he would just like to go home. He is interested in home health. FOC with CMS stars ratings offered. Would like referrals placed to [facility] and [facility]. Referrals placed via [facility].    Addendum 1343 [facility] declined referral as payer not accepted, [facility] unable to take on service as they are pushed out until 03/21/24. Pt aware. Search expanded. Pt refusing FWW.    The patient will continue to be evaluated for developing discharge needs.     Case Manager: Madelin Ka, RN  Phone: [phone]

## 2024-03-12 NOTE — OT Treatment (Signed)
 [facility]  Occupational Therapy Progress Note     03/12/24     Patient:  Steve Young   MR Number:  [id number]   Date of Birth: [birth year]   Admitting Diagnosis: Cervical stenosis of spinal canal [M48.02]  S/P cervical spinal fusion [Z98.1]     Treatment Minutes: 10     Precautions/Restrictions: FULL CODE: ATTEMPT RESUSCITATION/CPR, Fall risk, Spinal precautions: Logroll only, no twisting or bending, no lifting over 10 lbs, Brace on at all times: Tax Inspector Lines: None     Respiratory Status: Room air   Patient Effort: Good   Symptoms Noted During/After Session: pain     Pretreatment status: Patient seated EOB      Mental Status: Alert   Behavior/Mood Observations: Cooperative   Follows Commands: Follows directions and Verbal cues/prompting required     Numeric pain scale:  Pt reports 10/10 cervical pain     Skin integrity: Aspen collar donned     Areas of functional mobility addressed:     Sit - stand:  Supervision     Bed - Chair/ Chair - bed:  Not tested     Balance while sitting STATIC:  Fair+    DYNAMIC: Fair+      Balance while standing STATIC: Fair   DYNAMIC: Fair-     ADL's Addressed:     LB Dressing: Contact Guard Assist     Post Treatment Position: Patient seated EOB Call light within reach, Telephone within reach, and Sitter select activated     Clinical Impression:    Assessment: Patient cooperative during treatment session. Pt able to doff/don slipper socks using AE with CGA. Pt continues to c/o increased cervical pain. Pt would benefit from further skilled OT services in order to maximize fxl IND.       Anticipated Discharge Disposition: Inpatient rehab facility         Delon Gaskins, [place]

## 2024-03-12 NOTE — Nurses Notes (Signed)
 Patient discharged home with family.  AVS reviewed with patient/care giver.  A written copy of the AVS and discharge instructions was given to the patient/care giver.  Questions sufficiently answered as needed.  Patient/care giver encouraged to follow up with PCP as indicated.  In the event of an emergency, patient/care giver instructed to call 911 or go to the nearest emergency room.      Jonni Sanger, RN

## 2024-03-12 NOTE — Care Management Notes (Signed)
 [facility]  Care Management Note    Patient Name: Steve Young  Date of Birth: 11-10-1960  Sex: male  Date/Time of Admission: 03/07/2024  6:06 AM  Room/Bed: 1402/A  Payor: [place] MEDICARE / Plan: [place] MEDICARE DUAL COMPLETE / Product Type: MEDICARE MC /    LOS: 5 days   Primary Care Providers:  Lucinda Millman, MD, MD (General)    Admitting Diagnosis:  Cervical stenosis of spinal canal [M48.02]  S/P cervical spinal fusion [Z98.1]    Assessment:      03/12/24 1400   Assessment Details   Date of Care Management Update 03/12/24   Medicare Intent to Discharge Documentation   Discharge IMM give to: Patient   Discharge IMM Letter Given Date 03/12/24   Discharge IMM Letter Given Time 1358   IMM explained/reviewed with:  Patient;verbalized understanding   Care Management Plan   Discharge Planning Status discharge plan complete   Patient choice offered to patient/family Yes   Facility or Agency Preferences [facility]   Discharge Needs Assessment   Discharge Facility/Level of Care Needs Home with Home Health (code 6)         Discharge Plan:  Home with Home Health (code 6)  Pt aware [facility] will take on service 03/13/24. Pt agreeable. DC IMM completed. FOC signed.    The patient will continue to be evaluated for developing discharge needs.     Case Manager: Madelin Ka, RN  Phone: [phone]

## 2024-03-12 NOTE — Consults (Signed)
 [facility]  Internal Medicine Progress Note  FULL CODE: ATTEMPT RESUSCITATION/CPR    Name: Steve Young    Date of service: 03/12/2024    Chief Complaint: Surgery       Subjective         Allergies[1]    Objective     Vital Signs:  Blood pressure (!) 147/92, pulse 76, temperature (!) 35.9 C (96.6 F), resp. rate 18, height 1.753 m (5' 9.02), weight 90.7 kg (199 lb 15.3 oz), SpO2 94%.       Physical Exam:  Nursing note and vitals reviewed.  General: No acute distress, alert and oriented x3, neck collar placed  Cardio: Regular rate and rhythm. Normal S1 and S2. No murmurs, gallops, or rubs.  Resp: Clear to auscultation bilaterally. No wheezes, rales, rhonchi or crackles. Normal resp effort.  Abd: No rebound tenderness or involuntary guarding. Liver and spleen not palpable.  Extremities: No edema or swelling noted.   Skin: No rashes, ulcers, or bruising.  Neuro: No focal deficits. CN 2-12 intact.     Lines, Drains, and Airways:  Patient Lines/Drains/Airways Status       Active Line / Dialysis Catheter / Dialysis Graft / Drain / Airway / Wound       Name Placement date Placement time Site Days    Peripheral IV Posterior;Right Dorsal Metacarpals  (top of hand) 03/11/24  1659  -- less than 1    Wound  Incision Posterior Neck 03/07/24  0854  -- 5                     Labs:  Results for orders placed or performed during the hospital encounter of 03/07/24 (from the past 24 hours)   POC BLOOD GLUCOSE (RESULTS)   Result Value Ref Range    GLUCOSE, POC 131 (H) 70 - 100 mg/dl   POC BLOOD GLUCOSE (RESULTS)   Result Value Ref Range    GLUCOSE, POC 167 (H) 70 - 100 mg/dl   PHOSPHORUS   Result Value Ref Range    PHOSPHORUS 2.9 2.3 - 4.0 mg/dL   MAGNESIUM   Result Value Ref Range    MAGNESIUM 2.2 1.8 - 2.6 mg/dL   BASIC METABOLIC PANEL   Result Value Ref Range    SODIUM 140 136 - 145 mmol/L    POTASSIUM 4.9 3.5 - 5.1 mmol/L    CHLORIDE 101 96 - 111 mmol/L    CO2 TOTAL 33 (H) 23 - 31 mmol/L    ANION GAP 6 4 - 13  mmol/L    CALCIUM 9.2 8.6 - 10.3 mg/dL    GLUCOSE 854 (H) 65 - 125 mg/dL    BUN 12 8 - 25 mg/dL    CREATININE 9.12 9.24 - 1.35 mg/dL    eGFRcr - MALE >09 >=39 mL/min/1.84m^2    BUN/CREA RATIO 14 6 - 22   CBC WITH DIFF   Result Value Ref Range    WBC 13.8 (H) 3.7 - 11.0 x10^3/uL    RBC 4.36 (L) 4.50 - 6.10 x10^6/uL    HGB 12.6 (L) 13.4 - 17.5 g/dL    HCT 60.8 61.0 - 47.9 %    MCV 89.7 78.0 - 100.0 fL    MCH 28.9 26.0 - 32.0 pg    MCHC 32.2 31.0 - 35.5 g/dL    RDW-CV 86.8 88.4 - 84.4 %    PLATELETS 281 150 - 400 x10^3/uL    MPV 11.5 8.7 - 12.5 fL  NEUTROPHIL % 88.9 %    LYMPHOCYTE % 6.6 %    MONOCYTE % 3.7 %    EOSINOPHIL % 0.1 %    BASOPHIL % 0.1 %    NEUTROPHIL # 12.28 (H) 1.50 - 7.70 x10^3/uL    LYMPHOCYTE # 0.91 (L) 1.00 - 4.80 x10^3/uL    MONOCYTE # 0.51 0.20 - 1.10 x10^3/uL    EOSINOPHIL # <0.10 <=0.50 x10^3/uL    BASOPHIL # <0.10 <=0.20 x10^3/uL    IMMATURE GRANULOCYTE % 0.6 0.0 - 1.0 %    IMMATURE GRANULOCYTE # <0.10 <0.10 x10^3/uL   POC BLOOD GLUCOSE (RESULTS)   Result Value Ref Range    GLUCOSE, POC 141 (H) 70 - 100 mg/dl   POC BLOOD GLUCOSE (RESULTS)   Result Value Ref Range    GLUCOSE, POC 227 (H) 70 - 100 mg/dl         No results found for any visits on 03/07/24 (from the past 24 hours).     Radiology:    Results for orders placed or performed during the hospital encounter of 03/07/24   XR SINGLE SPINE VIEW     Status: None    Narrative    Steve Young  Male, 63 years old.    XR SINGLE SPINE VIEW performed on 03/07/2024 9:10 AM.    REASON FOR EXAM:  C7-T1 foraminotomy, C7-T1 foraminotomy     TECHNIQUE: 1 views/1 images submitted for interpretation.    COMPARISON: Cervical spine radiograph 02/15/2024      Impression    FINDINGS/IMPRESSION:  Single lateral intraoperative view of the cervical spine is submitted. There is redemonstration of anterior fusion hardware overlying C3, C4, and C5. There appears to be instrumentation posteriorly at the level of of C5-C6.          Radiologist location ID:  [id number]     FLUORO O-ARM IN OR     Status: None    Narrative    *Procedure not read by radiology.    *Please Refer to Procedure Note for result.        Inpatient Medications:  acetaminophen  (TYLENOL ) tablet, 650 mg, Oral, Q4H PRN  albuterol  (PROVENTIL ) 2.5 mg / 3 mL (0.083%) neb solution, 2.5 mg, Nebulization, 4x/day PRN  albuterol  90 mcg per inhalation oral inhaler - Nursing to administer, 1 Puff, Inhalation, Q4H PRN  amitriptyline (ELAVIL) tablet, 25 mg, Oral, NIGHTLY  atorvastatin  (LIPITOR) tablet, 40 mg, Oral, Daily  budesonide -formoterol  (SYMBICORT ) 160 mcg-4.5 mcg per inhalation oral inhaler - Nursing to administer, 2 Puff, Inhalation, 2x/day  Correction/SSIP insulin  lispro 100 units/mL injection pen, 2-9 Units, Subcutaneous, 4x/day AC  cyclobenzaprine  (FLEXERIL ) tablet, 10 mg, Oral, Q8H PRN  D10W bolus infusion 250 mL, 250 mL, Intravenous, Q1H PRN  dexAMETHasone 4 mg/mL injection, 4 mg, Intravenous, Q6H  dextrose  (GLUTOSE) 40% oral gel, 15 g, Oral, Q15 Min PRN  dextrose  50% (0.5 g/mL) injection - syringe, 12.5 g, Intravenous, Q15 Min PRN  docusate sodium (COLACE) capsule, 100 mg, Oral, 2x/day PRN  famotidine (PEPCID) tablet, 20 mg, Oral, 2x/day PRN  gabapentin  (NEURONTIN ) capsule, 800 mg, Oral, 3x/day  glucagon injection 1 mg, 1 mg, IntraMUSCULAR, Once PRN  heparin 5,000 unit/mL injection, 5,000 Units, Subcutaneous, 2x/day  HYDROmorphone (DILAUDID) 1 mg/mL injection, 1 mg, Intravenous, Q2H PRN  magnesium hydroxide (MILK OF MAGNESIA) 400mg  per 5mL oral liquid, 15 mL, Oral, Q6H PRN  melatonin tablet, 6 mg, Oral, NIGHTLY  naloxone (NARCAN) 0.4 mg/mL injection, 0.4 mg, Intravenous, Q2 MIN PRN  NS flush syringe,  3 mL, Intracatheter, Q8HRS  NS flush syringe, 3 mL, Intracatheter, Q1H PRN  NS flush syringe, 3 mL, Intracatheter, Q8HRS  NS flush syringe, 3 mL, Intracatheter, Q1H PRN  ondansetron  (ZOFRAN ) 2 mg/mL injection, 4 mg, Intravenous, Q6H PRN  oxyCODONE  (ROXICODONE ) immediate release tablet, 15 mg,  Oral, Q4H PRN  oxyCODONE  (ROXICODONE ) immediate release tablet, 10 mg, Oral, Q4H PRN  pantoprazole  (PROTONIX ) delayed release tablet, 40 mg, Oral, Daily  polyethylene glycol (MIRALAX) oral packet, 17 g, Oral, Daily  prochlorperazine (COMPAZINE) 5 mg/mL injection, 10 mg, Intravenous, Q6H PRN   Or  prochlorperazine (COMPAZINE) tablet, 10 mg, Oral, Q6H PRN        Nutrition:    DIET DIABETIC Calorie amount: CC 2000; Do you want to initiate MNT Protocol? Yes    Additional clinical characteristics related to nutrition:    - monitor for weight changes   - monitor intake and output    - monitor bowel functions        Reason for Consultation: Perioperative Medical Management     Assessment and Plan:  This is a 62 yr M patient who presented to [facility] on 10/22 for planned spine surgery. Significant PMH Cervical Spine Stenosis, T2DM, HTN, HLD, MASLD, Asthma, GERD, Vitamin D  Deficiency. Patient underwent C7-T Posterior Decompression and Fusion on 10/22 without complications. DC today.    Vitals and hemodynamically stable, mildly elevated blood pressure in the setting of IV Decadron, leukocytosis of 13.8 likely secondary to steroids.  BMP Mag and phos unremarkable grossly     Cervical Spine Stenosis s/p C7-T Posterior Decompression and Fusion on 10/22  -further management per primary team  -PT/OT consult  -continue Elavil qhs and Gabapentin . Hold home Ibuprofen .     Leukocytosis - Improving  -most likely reactive to above  -afebrile  -monitor CBC     Anemia  -mild expected postoperative blood loss  -Hgb baseline is 14. Hgb currently stable 12-13  -monitor CBC     Asthma  -stable on RA  -continue home inhaler  -aggressive pulmonary toilet, nebs PRN     T2DM  MASLD  -hold home Ozempic   -conservative SSI  -diabetic diet     HTN  -patient not currently on any antihypertensive medications     HLD  -continue Lipitor     GERD  -continue PPI     Hold home Aspirin      DVT Prophylaxis: per primary team  Full  Code        Deatrice Deforest Nora, MD             [1] No Known Allergies

## 2024-03-12 NOTE — PT Treatment (Signed)
 [address] [place], [place], [address]  ([phone]   (Fax) [phone]    REHABILITATION SERVICES  Physical Therapy Treatment Note     03/12/24     Patient:  Steve Young   MR Number:  [id number]   Date of Birth: 1960/11/25   Admitting Diagnosis: Cervical stenosis of spinal canal [M48.02]  S/P cervical spinal fusion [Z98.1]     Treatment Minutes: 12     Precautions/Restrictions: FULL CODE: ATTEMPT RESUSCITATION/CPR, Fall risk, Spinal precautions: Logroll only, no twisting or bending, no lifting over 10 lbs, Brace on at all times:  Biochemist, clinical   Medical Lines/Equipment: None    Respiratory Status: Room air   Patient Effort: Good   Symptoms Noted During/After Session: fatigue, pain     Prior Level of Function: Independent with no assistive device    Living Environment: Lives with his uncle, whom the patient cares for   Home Set-up : One story with basement (uncle lines in basement)   Steps: Exterior: 1 step(s) with no handrails     Interior: There are stairs to the basement but p doesn't need to go downstairs    Comments:      Mental Status: Alert   Behavior/Mood Observations: Behavior appropriate to situation, WNL/WFL   Follows Commands: Follows directions     Pretreatment status: Patient seated EOB      Comment:      Numeric pain scale: Reports 10/10 neck pain     Skin integrity: Unremarkable       Vital Signs: Unremarkable       Areas of functional mobility addressed:     Assistive devices used with bed mobility:  NA     Rolling in bed: Not tested     Supine - Sit:  Not tested     Sit - Supine: Not tested     Sit - stand:  Supervision     Bed - Chair/ Chair - bed:  Not tested     Balance while sitting STATIC:  Fair+    DYNAMIC: Fair      Balance while standing STATIC: Fair   DYNAMIC: Fair-     Comments:       Areas of gait training assessed:       Ambulation distance:  80 feet     Level of assistance:  Marketing Executive devices:  Wheeled Walker     Comments:          Post  Treatment Position: Patient seated EOB Call light within reach, Telephone within reach, and Sitter select activated     Clinical Impression:    Assessment: Patient was pleasant and cooperative on this date. [name] was able to ambulate at a CGA level but would benefit from more extensive therapy at an IRF. Recommend IRF.        Anticipated Discharge Disposition: Inpatient rehab facility        [place], [place]  03/12/2024, 11:16

## 2024-03-12 NOTE — Care Plan (Signed)
 Problem: Wound  Goal: Skin Health and Integrity  Intervention: Optimize Skin Protection  Recent Flowsheet Documentation  Taken 03/11/2024 2038 by Marvina POUR, LPN  Pressure Reduction Techniques: Mobility is maximized  Pressure Reduction Devices: Specialty bed/mattress utilized     Problem: Adult Inpatient Plan of Care  Goal: Absence of Hospital-Acquired Illness or Injury  Intervention: Identify and Manage Fall Risk  Recent Flowsheet Documentation  Taken 03/11/2024 2038 by Marvina POUR, LPN  Safety Promotion/Fall Prevention:   activity supervised   nonskid shoes/slippers when out of bed   safety round/check completed     Problem: Adult Inpatient Plan of Care  Goal: Absence of Hospital-Acquired Illness or Injury  Intervention: Prevent and Manage VTE (Venous Thromboembolism) Risk  Recent Flowsheet Documentation  Taken 03/11/2024 2038 by Marvina POUR, LPN  Sequential Compression Device (SCDs): Sequential compression device off  VTE Prevention/Management: anticoagulant therapy maintained       Starkeisha Vanwinkle, LPN

## 2024-03-13 ENCOUNTER — Inpatient Hospital Stay (HOSPITAL_COMMUNITY): Payer: Self-pay

## 2024-03-13 ENCOUNTER — Telehealth (HOSPITAL_COMMUNITY): Payer: Self-pay | Admitting: Family Medicine

## 2024-03-13 NOTE — Telephone Encounter (Signed)
 Transition of Care Contact Information  Discharge Date: 03/12/2024  Transition Facility Type--Hospital (Inpatient or Observation)  Facility Name--[facility]  Interactive Contact(s): Completed or attempted contact indicated by Date/Time  Completed Contact: 03/13/2024  9:29 AM  Contact Method(s)-- Patient/Caregiver Telephone  Clinical Staff Name/Role who Alyce Satterfield, RN  Transition Assessment  Discharge Summary obtained?--Yes  How are you recovering?--Not Any Better  Discharge Meds obtained?--Yes  Discharge medication changes reviewed?--Yes  Full Medication Reconciliation Completed?--No  Medication understanding --knows new medication(s)  Medication Concerns?--No  Have everything needed for recovery?--Yes  Care Coordination:   Patient has transition follow-up appointment date and time?--Yes  Follow up appointment date:--04/20/2024  Specialist Transition Visit planned?--No  Specialist Transition Visit date:  Patient/caregiver plans to attend transition visit?--Yes  Primary Follow-up Barrier  Interventions provided --reinforced discharge instructions--patient expresses understanding of follow-up plan  Home Health or DME ordered at discharge?--Yes  Agency has contacted patient to initiate services?--Yes  Home Health or DME Agency:--[facility]  Clinician/Team notified?--No  Primary reason clinician notified?  Transition Note:Instructions/Plan/Review:     Patient requests transportation referral for follow up appointment?No  Reviewed After Visit [place] (AVS) and/or discharge summary with patient? Yes   Educated patient to contact PCP for non-acute symptoms during office business hours.  PCP number provided/reinforced. Yes   Nurse Navigator toll free number, resources available, and 24/7 hours of operation provided to patient and/or caregiver. Yes   Referrals to population health? No  Additional information/assessment:  Patient states that he is still in a lot of pain, he is taking his new medications as  prescribed. Patient refused the walker because he is unable to use it in his home.    Patient has a PCP appointment scheduled. NN discussed the importance of making follow up appointments with verbal understanding from the patient.  NN reviewed AVS with patient, including NN line information and phone number. Patient verbalizes understanding of instructions, information, education and has no further questions.      Logan Satterfield, RN

## 2024-03-13 NOTE — Discharge Summary (Signed)
 [facility]  DISCHARGE SUMMARY      PATIENT NAME:  Giomar, Gusler  MRN:  [id number]  DOB:  07-Mar-1961      INPATIENT ADMISSION DATE: 03/07/2024  DISCHARGE DATE: 03/12/2024    ADMITTING PHYSICIAN:  Camellia Barban MD  DISCHARGING PHYSICIAN :Camellia Barban MD  PRIMARY CARE PHYSICIAN: Vernell Sauce, MD       DISCHARGE DIAGNOSIS: s/p posterior cervical decompression and fusion      HOSPITAL COURSE:  HPI:  Kerby Borner is a 63 y.o. male who after appropriate medical clearance underwent C7-T1 PCDF. Patient received pre/post operative antibiotics. Patient tolerated the procedure well without immediate complications. After an uneventful course in the PACU the patient was transferred to the surgical floor in stable condition.     Patient received medication for pain management and physical and occupational therapy for mobilization.        Patient deemed surgically stable for discharge, discharge medications sent to pharmacy and discharge instructions provided.  Patient will follow up for post op visit in 2 weeks.     Patient was followed by the medicine service during admission, no acute medical issues arose.      Physical Exam:    See exam from day of discharge.       Labs:  Results for orders placed or performed during the hospital encounter of 03/07/24 (from the past 24 hours)   POC BLOOD GLUCOSE (RESULTS)   Result Value Ref Range    GLUCOSE, POC 153 (H) 70 - 100 mg/dl     Diagnostic Studies:  Results for orders placed or performed during the hospital encounter of 03/07/24   XR SINGLE SPINE VIEW     Status: None    Narrative    Maximilliano ARGYLE RITA  Male, 63 years old.    XR SINGLE SPINE VIEW performed on 03/07/2024 9:10 AM.    REASON FOR EXAM:  C7-T1 foraminotomy, C7-T1 foraminotomy     TECHNIQUE: 1 views/1 images submitted for interpretation.    COMPARISON: Cervical spine radiograph 02/15/2024      Impression    FINDINGS/IMPRESSION:  Single lateral intraoperative view of the cervical spine is  submitted. There is redemonstration of anterior fusion hardware overlying C3, C4, and C5. There appears to be instrumentation posteriorly at the level of of C5-C6.          Radiologist location ID: [id number]     FLUORO O-ARM IN OR     Status: None    Narrative    *Procedure not read by radiology.    *Please Refer to Procedure Note for result.          DISCHARGE MEDICATIONS:     Current Discharge Medication List        START taking these medications.        Details   cyclobenzaprine  10 mg Tablet  Commonly known as: FLEXERIL    10 mg, Oral, EVERY 8 HOURS PRN  Qty: 40 Tablet  Refills: 0     gabapentin  400 mg Capsule  Commonly known as: NEURONTIN   Replaces: gabapentin  600 mg Tablet   800 mg, Oral, 3 TIMES DAILY  Qty: 60 Capsule  Refills: 0     Methylprednisolone  4 mg Tablets, Dose Pack  Commonly known as: MEDROL  DOSEPACK   Take as instructed.  Qty: 21 Tablet  Refills: 0     oxyCODONE  15 mg Tablet  Commonly known as: ROXICODONE    15 mg, Oral, EVERY 6 HOURS PRN  Qty: 40 Tablet  Refills: 0            CONTINUE these medications - NO CHANGES were made during your visit.        Details   * albuterol  sulfate 90 mcg/actuation oral inhaler  Commonly known as: PROVENTIL  or VENTOLIN  or PROAIR    1-2 Puffs, Inhalation, EVERY 4 HOURS PRN  Qty: 6.7 g  Refills: 3     * albuterol  sulfate 2.5 mg /3 mL (0.083 %) nebulizer solution  Commonly known as: PROVENTIL    2.5 mg, Nebulization, 4 TIMES DAILY PRN  Qty: 360 mL  Refills: 3     amitriptyline 25 mg Tablet  Commonly known as: ELAVIL   25 mg, Oral, NIGHTLY  Qty: 30 Tablet  Refills: 3     aspirin  81 mg Tablet, Delayed Release (E.C.)  Commonly known as: ECOTRIN   81 mg, Oral, Daily  Qty: 90 Tablet  Refills: 3     atorvastatin  40 mg Tablet  Commonly known as: LIPITOR   40 mg, Oral, Daily  Qty: 90 Tablet  Refills: 3     budesonide -formoteroL  160-4.5 mcg/actuation oral inhaler  Commonly known as: Symbicort    2 Puffs, Inhalation, 2 TIMES DAILY  Qty: 10.2 g  Refills: 11     Centrum Complete  18-400 mg-mcg Tablet  Generic drug: multivitamin-iron-folic acid    1 Tablet, Oral, Daily  Qty: 90 Tablet  Refills: 3     omeprazole  40 mg Capsule, Delayed Release(E.C.)  Commonly known as: PRILOSEC   40 mg, Oral, Daily  Qty: 90 Capsule  Refills: 3     potassium chloride  10 mEq Tab Sust.Rel. Particle/Crystal  Commonly known as: K-DUR   10 mEq, 2 TIMES DAILY  Refills: 0     semaglutide  1 mg/dose (4 mg/3 mL) Pen Injector  Commonly known as: OZEMPIC    1 mg, Subcutaneous, EVERY 7 DAYS  Qty: 9 mL  Refills: 3           * This list has 2 medication(s) that are the same as other medications prescribed for you. Read the directions carefully, and ask your doctor or other care provider to review them with you.                STOP taking these medications.      gabapentin  600 mg Tablet  Commonly known as: NEURONTIN   Replaced by: gabapentin  400 mg Capsule     Ibuprofen  800 mg Tablet  Commonly known as: MOTRIN      methocarbamoL 750 mg Tablet  Commonly known as: ROBAXIN              DISCHARGE INSTRUCTIONS:    Follow-up Information       Orthopedics Spine, [facility] .    Specialty: Orthopaedics  Contact information:  [address]  [place]  [address]  [phone]                                 CONDITION ON DISCHARGE: Stable    DISCHARGE DISPOSITION:  home with home health services.    Total Time Spent: Time spent is less than 30 minutes      The patient was seen independently with supervising/collaborating physician present for consultation if needed.   Aden Youngman, PA-C

## 2024-03-14 ENCOUNTER — Inpatient Hospital Stay (HOSPITAL_COMMUNITY): Payer: Self-pay

## 2024-03-15 ENCOUNTER — Inpatient Hospital Stay (HOSPITAL_COMMUNITY): Payer: Self-pay

## 2024-03-16 ENCOUNTER — Inpatient Hospital Stay (HOSPITAL_COMMUNITY): Payer: Self-pay

## 2024-03-16 ENCOUNTER — Telehealth (INDEPENDENT_AMBULATORY_CARE_PROVIDER_SITE_OTHER): Payer: Self-pay | Admitting: Family Medicine

## 2024-03-16 DIAGNOSIS — Z9889 Other specified postprocedural states: Secondary | ICD-10-CM

## 2024-03-16 DIAGNOSIS — Z4889 Encounter for other specified surgical aftercare: Secondary | ICD-10-CM

## 2024-03-16 NOTE — Telephone Encounter (Signed)
 Patient came in asking if he could have a refill on bordered gauze with adhesive border.

## 2024-03-17 ENCOUNTER — Inpatient Hospital Stay (HOSPITAL_COMMUNITY): Payer: Self-pay

## 2024-03-18 MED ORDER — GAUZE BANDAGE
CUTANEOUS | 1 refills | Status: AC
Start: 2024-03-18 — End: ?

## 2024-03-18 NOTE — Progress Notes (Unsigned)
 Sent. May check pharmacy if accepted. Thank you.

## 2024-03-20 ENCOUNTER — Inpatient Hospital Stay (INDEPENDENT_AMBULATORY_CARE_PROVIDER_SITE_OTHER): Payer: Self-pay | Admitting: Family

## 2024-03-23 ENCOUNTER — Other Ambulatory Visit: Payer: Self-pay

## 2024-03-23 ENCOUNTER — Ambulatory Visit (HOSPITAL_COMMUNITY)
Admission: RE | Admit: 2024-03-23 | Discharge: 2024-03-23 | Disposition: A | Discharge status: Home / Self Care | Source: Ambulatory Visit | Admitting: Radiology

## 2024-03-23 ENCOUNTER — Encounter (HOSPITAL_COMMUNITY): Payer: Self-pay | Admitting: PHYSICIAN ASSISTANT

## 2024-03-23 ENCOUNTER — Ambulatory Visit: Payer: Self-pay | Attending: PHYSICIAN ASSISTANT | Admitting: PHYSICIAN ASSISTANT

## 2024-03-23 VITALS — Ht 69.0 in | Wt 199.0 lb

## 2024-03-23 DIAGNOSIS — M549 Dorsalgia, unspecified: Secondary | ICD-10-CM | POA: Insufficient documentation

## 2024-03-23 DIAGNOSIS — M4802 Spinal stenosis, cervical region: Secondary | ICD-10-CM | POA: Insufficient documentation

## 2024-03-23 DIAGNOSIS — M542 Cervicalgia: Secondary | ICD-10-CM

## 2024-03-23 DIAGNOSIS — Z9889 Other specified postprocedural states: Secondary | ICD-10-CM

## 2024-03-23 MED ORDER — OXYCODONE 10 MG TABLET
10.0000 mg | ORAL_TABLET | ORAL | 0 refills | Status: DC | PRN
Start: 2024-03-23 — End: 2024-04-09

## 2024-03-23 NOTE — Progress Notes (Signed)
 [facility]  Spine Center  Post Op Clinic Note     Name: Steve Young MRN:  [id number]   Date: 03/23/2024 Age: 63 y.o.       HPI:  Steve Young is a 63 y.o. male who is seen 2 weeks status post C7-T1 decompression and fusion.  He is doing well at this point and certainly has seen improvement in his preoperative right arm pain.  He is reporting expected pain at his incision site, more so on the left side than on the right side.  He is up and moving around throughout the day without problem.  He has had no problems with his incision, no fevers chills nausea or vomiting      General Physical Exam:  Ht 1.753 m (5' 9)   Wt 90.3 kg (199 lb)   BMI 29.39 kg/m       Physical Exam:  5/5 strength bilateral upper extremities  Sensation is intact bilaterally, Hoffman's sign is negative  Deep tendon reflexes are within normal limits and symmetric    INCISION:  Clean and dry.  Moderate swelling on the left at the cephalad part of the infusion.  Not fluctuant or tender to palpate. Some serous drainage on the dressing, no active drainage.  Surrounding erythema at the staple sites.  Incision itself is well healed.  Wound was cleansed with alcohol and Staples were removed without problem      Imaging:  X-rays of the cervical spine AP and lateral were reviewed and interpreted by myself showing all hardware to be solid and in good position, no evidence of junctional level instability    Assessment/Plan:  ENCOUNTER DIAGNOSES     ICD-10-CM   1. Neck pain  M54.2   2. Back pain  M54.9       Steve Young is a 63 year old male now 2 weeks status post C7-T1 PCDF and overall doing well.  He is certainly showing signs of improvement in preoperative nerve pain has improved greatly.  I will get him a soft collar today.  He will continue his lifting restriction and see us  back in another 4 weeks.  He will call in the meantime with any problems or concerns      The patient was seen independently with supervising/collaborating  physician present for consultation if needed.   Electronically signed by Allean Singleton, PA-C   03/23/2024    This note has been generated by a word recognition computerized program. There may be grammatical, typographical, or word substitution errors which have escaped my editorial review.

## 2024-03-24 ENCOUNTER — Other Ambulatory Visit (INDEPENDENT_AMBULATORY_CARE_PROVIDER_SITE_OTHER): Payer: Self-pay | Admitting: Family Medicine

## 2024-03-24 DIAGNOSIS — M62838 Other muscle spasm: Secondary | ICD-10-CM

## 2024-03-25 DIAGNOSIS — M542 Cervicalgia: Secondary | ICD-10-CM

## 2024-03-25 DIAGNOSIS — M549 Dorsalgia, unspecified: Secondary | ICD-10-CM

## 2024-03-25 DIAGNOSIS — M47814 Spondylosis without myelopathy or radiculopathy, thoracic region: Secondary | ICD-10-CM

## 2024-03-26 NOTE — Telephone Encounter (Signed)
 Last Visit:02/16/2024     Upcoming appointments: 04/20/2024           Mattix Imhof, MA  03/26/2024, 07:58

## 2024-04-01 ENCOUNTER — Other Ambulatory Visit (INDEPENDENT_AMBULATORY_CARE_PROVIDER_SITE_OTHER): Payer: Self-pay | Admitting: Family Medicine

## 2024-04-01 DIAGNOSIS — J4540 Moderate persistent asthma, uncomplicated: Secondary | ICD-10-CM

## 2024-04-02 NOTE — Telephone Encounter (Signed)
 Last Visit:02/16/2024     Upcoming appointments: 04/20/2024           Dasia Guerrier, MA  04/02/2024, 08:06

## 2024-04-03 ENCOUNTER — Other Ambulatory Visit (INDEPENDENT_AMBULATORY_CARE_PROVIDER_SITE_OTHER): Payer: Self-pay | Admitting: Family Medicine

## 2024-04-03 DIAGNOSIS — M5030 Other cervical disc degeneration, unspecified cervical region: Secondary | ICD-10-CM

## 2024-04-03 DIAGNOSIS — M170 Bilateral primary osteoarthritis of knee: Secondary | ICD-10-CM

## 2024-04-03 NOTE — Telephone Encounter (Signed)
 Last Visit:02/16/2024     Upcoming appointments: 04/20/2024           Kannon Granderson, MA  04/03/2024, 08:09

## 2024-04-09 ENCOUNTER — Other Ambulatory Visit (HOSPITAL_COMMUNITY): Payer: Self-pay | Admitting: ORTHOPAEDIC SURGERY

## 2024-04-09 MED ORDER — OXYCODONE 10 MG TABLET
10.0000 mg | ORAL_TABLET | ORAL | 0 refills | Status: AC | PRN
Start: 2024-04-09 — End: ?

## 2024-04-09 MED ORDER — GABAPENTIN 400 MG CAPSULE
800.0000 mg | ORAL_CAPSULE | Freq: Three times a day (TID) | ORAL | 0 refills | Status: AC
Start: 2024-04-09 — End: ?

## 2024-04-09 NOTE — Telephone Encounter (Signed)
 Patient called requesting refill of the pended medications. Please sign if allowed

## 2024-04-15 ENCOUNTER — Other Ambulatory Visit (INDEPENDENT_AMBULATORY_CARE_PROVIDER_SITE_OTHER): Payer: Self-pay | Admitting: Family Medicine

## 2024-04-15 DIAGNOSIS — I10 Essential (primary) hypertension: Secondary | ICD-10-CM

## 2024-04-15 DIAGNOSIS — E876 Hypokalemia: Secondary | ICD-10-CM

## 2024-04-16 NOTE — Telephone Encounter (Signed)
 Last Visit:02/16/2024     Upcoming appointments: 04/20/2024           Keilon Ressel, MA  04/16/2024, 08:28

## 2024-04-20 ENCOUNTER — Other Ambulatory Visit: Payer: Self-pay

## 2024-04-20 ENCOUNTER — Ambulatory Visit (INDEPENDENT_AMBULATORY_CARE_PROVIDER_SITE_OTHER): Payer: Self-pay | Admitting: Family Medicine

## 2024-04-20 ENCOUNTER — Encounter (INDEPENDENT_AMBULATORY_CARE_PROVIDER_SITE_OTHER): Payer: Self-pay | Admitting: Family Medicine

## 2024-04-20 VITALS — BP 142/88 | HR 62 | Temp 97.2°F | Resp 18 | Ht 69.0 in | Wt 202.4 lb

## 2024-04-20 DIAGNOSIS — Z7985 Long-term (current) use of injectable non-insulin antidiabetic drugs: Secondary | ICD-10-CM

## 2024-04-20 DIAGNOSIS — R1310 Dysphagia, unspecified: Secondary | ICD-10-CM

## 2024-04-20 DIAGNOSIS — J45909 Unspecified asthma, uncomplicated: Secondary | ICD-10-CM | POA: Insufficient documentation

## 2024-04-20 DIAGNOSIS — E785 Hyperlipidemia, unspecified: Secondary | ICD-10-CM

## 2024-04-20 DIAGNOSIS — I10 Essential (primary) hypertension: Secondary | ICD-10-CM

## 2024-04-20 DIAGNOSIS — E1142 Type 2 diabetes mellitus with diabetic polyneuropathy: Secondary | ICD-10-CM

## 2024-04-20 DIAGNOSIS — D649 Anemia, unspecified: Secondary | ICD-10-CM

## 2024-04-20 DIAGNOSIS — E1169 Type 2 diabetes mellitus with other specified complication: Secondary | ICD-10-CM

## 2024-04-20 DIAGNOSIS — F5104 Psychophysiologic insomnia: Secondary | ICD-10-CM

## 2024-04-20 DIAGNOSIS — R9431 Abnormal electrocardiogram [ECG] [EKG]: Secondary | ICD-10-CM

## 2024-04-20 DIAGNOSIS — Z7951 Long term (current) use of inhaled steroids: Secondary | ICD-10-CM

## 2024-04-20 DIAGNOSIS — K5909 Other constipation: Secondary | ICD-10-CM

## 2024-04-20 MED ORDER — SENNOSIDES 8.6 MG-DOCUSATE SODIUM 50 MG TABLET: 1.0000 | ORAL_TABLET | Freq: Every evening | ORAL | 3 refills | Status: AC | PRN

## 2024-04-20 MED ORDER — POLYETHYLENE GLYCOL 3350 17 GRAM ORAL POWDER PACKET: 17.0000 g | Freq: Every day | ORAL | 3 refills | Status: AC

## 2024-04-20 MED ORDER — DOXEPIN 6 MG TABLET: 1.0000 | ORAL_TABLET | Freq: Every evening | ORAL | 3 refills | Status: AC

## 2024-04-20 MED ORDER — ASPIRIN 81 MG TABLET,DELAYED RELEASE: 81.0000 mg | DELAYED_RELEASE_TABLET | Freq: Every day | ORAL | Status: AC

## 2024-04-20 MED ORDER — LOSARTAN 25 MG TABLET: 25.0000 mg | ORAL_TABLET | Freq: Every day | ORAL | 3 refills | Status: DC

## 2024-04-20 NOTE — Progress Notes (Signed)
 PRIMARY CARE, [facility]  [address] [place] [address]           Name: Steve Young  MRN: [id number]  Date of Birth: 06/10/1960    Encounter Date: 04/20/2024    Chief Complaint   Patient presents with    Follow Up     Diabetes, hyperlipidemia, hypertension, asthma, anemia, constipation, insomnia    Difficulty Swallowing     Last office visit with the provider:02/16/2024     SUBJECTIVE:  Steve Young is a 63 y.o. male,White. Patient had problems with intubation and was recommended to see ENT.  He also has abnormal ECG and was recommended to see cardiologist. He does not have the following symptoms today: fever, chills, cough, nasal congestion, sore throat, abdominal pain, headache, dizziness, eye pain or irritation, epistaxis, chest discomfort, stroke symptoms, shortness of breath, dyspnea on exertion or palpitations. Side effects of medications: no current medication side effects.     Current Outpatient Medications   Medication Sig    albuterol  sulfate (PROVENTIL  OR VENTOLIN  OR PROAIR ) 90 mcg/actuation Inhalation oral inhaler Take 1-2 Puffs by inhalation Every 4 hours as needed    albuterol  sulfate (PROVENTIL ) 2.5 mg /3 mL (0.083 %) Inhalation nebulizer solution Take 3 mL (2.5 mg total) by nebulization Four times a day as needed for Wheezing Indications: asthma attack    amitriptyline  (ELAVIL ) 25 mg Oral Tablet Take 1 Tablet (25 mg total) by mouth Every night Indications: difficulty sleeping, neuropathic pain    aspirin  (ECOTRIN) 81 mg Oral Tablet, Delayed Release (E.C.) Take 1 Tablet (81 mg total) by mouth Daily    atorvastatin  (LIPITOR) 40 mg Oral Tablet Take 1 Tablet (40 mg total) by mouth Daily Indications: high cholesterol and high triglycerides, treatment to prevent a heart attack    budesonide -formoteroL  (SYMBICORT ) 160-4.5 mcg/actuation Inhalation oral inhaler Take 2 Puffs by inhalation Twice daily Indications: controller medication for asthma    gabapentin   (NEURONTIN ) 400 mg Oral Capsule Take 2 Capsules (800 mg total) by mouth Three times a day    Gauze Bandage Bandage bordered gauze with adhesive border use daily as needed    multivitamin-iron-folic acid  (CENTRUM COMPLETE) 18-400 mg-mcg Oral Tablet Take 1 Tablet by mouth Daily Indications: treatment to prevent vitamin deficiency, vitamin deficiency    omeprazole  (PRILOSEC) 40 mg Oral Capsule, Delayed Release(E.C.) Take 1 Capsule (40 mg total) by mouth Daily Indications: gastroesophageal reflux disease    potassium chloride  (K-DUR) 10 mEq Oral Tab Sust.Rel. Particle/Crystal Take 1 Tablet (10 mEq total) by mouth Twice daily    semaglutide  (OZEMPIC ) 1 mg/dose (4 mg/3 mL) Subcutaneous Pen Injector Inject 1 mg under the skin Every 7 days Indications: type 2 diabetes mellitus, weight loss management for a person with obesity      Allergies[1]    Past Surgical History:   Procedure Laterality Date    COLONOSCOPY      ELBOW SURGERY Left 12/2017    HX OTHER  2004     SPINAL FUSION,ANT,EA ADNL LEVEL c3-c7    HX OTHER Right     right great toe    HX SHOULDER SURGERY Bilateral     NOSE SURGERY           Social History[2]    OBJECTIVE:   The patient appears to be in no acute distress.  Vitals: BP (!) 142/88 (Site: Left Arm, Patient Position: Sitting, Cuff Size: Adult Large)   Pulse 62   Temp 36.2 C (97.2  F) (Temporal)   Resp 18   Ht 1.753 m (5' 9)   Wt 91.8 kg (202 lb 6.4 oz)   SpO2 98%   BMI 29.89 kg/m   General: alert, no acute distress  Head: normocephalic, atraumatic  Eye: EOM intact, normal conjunctiva  Respiratory: Good diaphragmatic excursion. Lungs clear.  Heart: RRR  Extremities: Ambulatory, no edema  Neurologic: Awake, alert, oriented, speech clear and coherent  Skin: No jaundice, warm   Psychiatric: Cooperative, normal judgment    ASSESSMENT AND PLAN:     ICD-10-CM    1. Type 2 diabetes mellitus with hyperlipidemia (CMS HCC)  (CMS HCC)  E11.69 External Referral to OPHTHAMOLOGIST/OPTOMETRIST    E78.5 Basic  Metabolic Panel-Non Fasting  His last A1c was normal. Continue well-balanced ADA meals, semaglutide , atorvastatin , checking feet regularly, regular exercise as tolerated, weight management, control of blood pressure and cholesterol, regular eye checks.      2. Type 2 diabetes mellitus with peripheral neuropathy (CMS HCC)  E11.42 aspirin  (ECOTRIN) 81 mg Oral Tablet, Delayed Release (E.C.)     Basic Metabolic Panel-Non Fasting     CBC  Stable.  Continue gabapentin .      3. Essential hypertension  I10 TRANSTHORACIC ECHOCARDIOGRAM - ADULT COMPLETE     ECG 12 LEAD     Basic Metabolic Panel-Non Fasting     CBC  losartan  (COZAAR ) 25 mg Oral Tablet - new  His blood pressure is still not at goal.  Added losartan  daily and continue lifestyle changes.  He may return in 1-2 as nurse visit for blood pressure check.      4. 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  Continue inhalers.      5. Mild anemia  D64.9 Basic Metabolic Panel-Non Fasting     CBC  Stable.  Continue multivitamin supplement and will continue to monitor.      6. Swallowing problem  R13.10 Refer to [facility]. [facility]      7. Abnormal ECG  R94.31 TRANSTHORACIC ECHOCARDIOGRAM - ADULT COMPLETE     Refer to [facility]     ECG 12 LEAD     8. Chronic constipation  K59.09 sennosides-docusate sodium  (SENOKOT-S) 8.6-50 mg Oral Tablet - new     polyethylene glycol (MIRALAX ) 17 gram Oral Powder in Packet - new  Advised to maintain good hydration, fruits and vegetables regularly, regular exercise.  Added MiraLax  daily and Senokot daily as needed.      9. Chronic insomnia  F51.04 Doxepin  6 mg Oral Tablet - new  He may stop Elavil  and try doxepin .  He is still not sleeping well. He will call if the medicine is not effective or covered.          Importance of medication adherence discussed.  Advised patient to fill  medications regularly.  Current diabetes medications:   Active Diabetes Meds   Medication Sig    semaglutide  (OZEMPIC ) 1 mg/dose (4 mg/3 mL) Subcutaneous Pen Injector Inject 1 mg under the skin Every 7 days Indications: type 2 diabetes mellitus, weight loss management for a person with obesity       Data reviewed:   PROSTATE SPECIFIC ANTIGEN   Lab Results   Component Value Date/Time    PROSSPECAG 1.75 10/12/2023 01:29 PM        Recent Results (from the past [id number] hours)   ECG 12 LEAD  Collection Time: 02/09/24  3:52 PM   Result Value    Ventricular rate 71    Atrial Rate 71    PR Interval 224    QRS Duration 94    QT Interval 382    QTC Calculation 415    Calculated P Axis 57    Calculated R Axis 43    Calculated T Axis 33    Narrative    Sinus rhythm with 1st degree AV block  Cannot rule out Anterior infarct , age undetermined  Abnormal ECG  When compared with ECG of 12-Apr-2023 15:01,  No significant change was found     Lab Results   Component Value Date/Time    HA1C 5.5 02/09/2024 04:00 PM    MICALBCRERAT 8.6 02/09/2024 04:00 PM    BUN 12 03/12/2024 06:15 AM    CREATININE 0.87 03/12/2024 06:15 AM    GFR >90 03/12/2024 06:15 AM    GLUCOSE 145 (H) 03/12/2024 06:15 AM    CALCIUM 9.2 03/12/2024 06:15 AM    SODIUM 140 03/12/2024 06:15 AM    POTASSIUM 4.9 03/12/2024 06:15 AM    WBC 13.8 (H) 03/12/2024 06:15 AM    HGB 12.6 (L) 03/12/2024 06:15 AM    HCT 39.1 03/12/2024 06:15 AM    PLTCNT 281 03/12/2024 06:15 AM    AST 12 02/09/2024 04:00 PM    ALT 13 02/09/2024 04:00 PM    ALKPHOS 84 02/09/2024 04:00 PM    CHOLESTEROL 92 (L) 02/15/2024 12:29 PM    HDLCHOL 34 (L) 02/15/2024 12:29 PM    LDLCHOL 43 02/15/2024 12:29 PM    TRIG 66 02/15/2024 12:29 PM    TSH 0.979 03/29/2023 10:48 AM    MAGNESIUM  2.2 03/12/2024 06:15 AM     Orders Placed This Encounter    Basic Metabolic Panel-Non Fasting    CBC    External Referral to OPHTHAMOLOGIST/OPTOMETRIST    Refer to [facility]. [facility]    Refer to  [facility]    ECG 12 LEAD    TRANSTHORACIC ECHOCARDIOGRAM - ADULT COMPLETE    sennosides-docusate sodium  (SENOKOT-S) 8.6-50 mg Oral Tablet    polyethylene glycol (MIRALAX ) 17 gram Oral Powder in Packet    losartan  (COZAAR ) 25 mg Oral Tablet    Doxepin  6 mg Oral Tablet     Recheck blood work this month. Above plan was discussed with the patient and patient verbalized understanding. Patient agreed with above treatment and plan. Questions answered to patient's satisfaction. Risks, benefits, and alternatives to above treatment discussed with patient.   Follow-up in 4 months , sooner should new symptoms or problems arise.   He  was advised to contact the office if with any questions or concerns.    Vernell PARAS. Lucinda, MD  DEPT PHONE: [phone]  DEPT FAX: [phone]  Note: This chart was transcribed using voice recognition software and may contain unintended word substitution or minor typographical errors.         [1] No Known Allergies  [2]   Social History  Tobacco Use    Smoking status: Never    Smokeless tobacco: Never   Vaping Use    Vaping status: Never Used   Substance Use Topics    Alcohol use: Not Currently    Drug use: Never

## 2024-04-20 NOTE — Nursing Note (Signed)
 Pt is here today for 2 month follow up. Hasn't had diabetic eye exam yet.

## 2024-04-23 ENCOUNTER — Ambulatory Visit: Payer: Self-pay | Attending: ORTHOPAEDIC SURGERY | Admitting: ORTHOPAEDIC SURGERY

## 2024-04-23 ENCOUNTER — Ambulatory Visit (HOSPITAL_COMMUNITY): Admission: RE | Admit: 2024-04-23 | Discharge: 2024-04-23 | Disposition: A | Source: Ambulatory Visit

## 2024-04-23 ENCOUNTER — Other Ambulatory Visit: Payer: Self-pay

## 2024-04-23 ENCOUNTER — Encounter (HOSPITAL_COMMUNITY): Payer: Self-pay | Admitting: ORTHOPAEDIC SURGERY

## 2024-04-23 VITALS — Ht 69.0 in | Wt 202.0 lb

## 2024-04-23 DIAGNOSIS — Z981 Arthrodesis status: Secondary | ICD-10-CM

## 2024-04-23 MED ORDER — HYDROCODONE 5 MG-ACETAMINOPHEN 325 MG TABLET: 1.0000 | ORAL_TABLET | Freq: Four times a day (QID) | ORAL | 0 refills | Status: AC | PRN

## 2024-04-23 NOTE — Progress Notes (Signed)
 ENT, [facility]  [address]  [place] [address]  [phone]    PATIENT NAME:  Steve Young  MRN:  [id number]  DOB:  1961-02-12  DATE OF SERVICE: 04/24/2024    Chief Complaint:  No chief complaint on file.      HPI:  Steve Young is a 63 y.o. male presenting as a new patient for evaluation of ***    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    Asthma     Asthma exacerbation 01/09/2023    Atherosclerosis of both carotid arteries 03/04/2020    Cervicalgia 03/21/2018    Chronic abdominal pain 10/04/2018    Chronic back pain 03/21/2018    Chronic chest pain 03/21/2018    Chronic insomnia     Constipation in male 09/13/2017    COVID 12/2022    COVID-19 04/28/2021    Degenerative cervical spinal stenosis 03/30/2023    Diverticulitis 01/09/2023    Elevated lactic acid level 01/09/2023    Esophageal reflux     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    Hepatic lesion 10/04/2018    Hyperlipidemia     Hypertension     Hypertriglyceridemia 05/15/2022    Hypokalemia 11/18/2020    Impingement syndrome of left shoulder 12/28/2017    Metabolic dysfunction-associated steatotic liver disease (MASLD) 10/04/2018    Mild anemia 02/07/2023    Mild persistent asthma without complication 03/21/2018    NAFL (nonalcoholic fatty liver)     Normal cardiac stress test 03/21/2018    Nosebleed     Obesity     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    Partial tear of common extensor tendon of elbow 12/13/2017    Pituitary tumor     Prediabetes 02/18/2020    Rib pain 10/04/2018    Sepsis 01/09/2023    Sexual dysfunction     Tear of talofibular ligament 05/30/2013    RIGHT    Type 2 diabetes mellitus with other specified complication, unspecified whether long term insulin  use (CMS HCC) 01/11/2023    Vitamin D  deficiency           Past Surgical History:  Past Surgical History:   Procedure Laterality Date    COLONOSCOPY      ELBOW SURGERY Left 12/2017    HX OTHER  2004     SPINAL FUSION,ANT,EA ADNL LEVEL c3-c7    HX OTHER Right     right great toe    HX SHOULDER SURGERY Bilateral     NOSE SURGERY           Family History:  Family Medical History:       Problem Relation (Age of Onset)    Cancer Mother, Sister    Gout Father    Heart Disease Mother, Sister, Nephew            Social History:  Tobacco Use History[1]  Social History     Substance and Sexual Activity   Alcohol Use Not Currently     Social History     Occupational History    Occupation: retired: ship yards, drove marine scientist        Medications:  No outpatient medications have been marked as taking for the 04/24/24 encounter (Appointment) with Grisell Bissette, MD.  Allergies:  Allergies[2]    Review of Systems:                                                        All other systems reviewed and found to be negative.    Physical Exam:  There were no vitals taken for this visit.  There is no height or weight on file to calculate BMI.  General Appearance: Pleasant, cooperative, healthy, and in no acute distress.  Eyes: Conjunctivae/corneas clear, PERRLA, EOM's intact.  Head and Face: Normocephalic, atraumatic.  Face symmetric, no obvious lesions.   Right Ear:       Pinnae: Normal shape and position.        External auditory canals: *** Patent without inflammation.       Tympanic membranes:  Intact, translucent, midposition, middle ear aerated.  Left Ear:       Pinnae: Normal shape and position.        External auditory canals: *** Patent without inflammation.       Tympanic membranes:  Intact, translucent, midposition, middle ear aerated.  Nose: External pyramid midline. Mucosa normal. No purulence, polyps, or crusts.   Oral Cavity/Oropharynx: No mucosal lesions, masses, or pharyngeal asymmetry.  Tonsils: Deferred.  Nasopharynx: Deferred.  Hypopharynx/Larynx:  Deferred.  Neck: no cervical adenopathy, no palpable thyroid  or salivary gland masses  Thyroid : no significant thyroid  abnormality by palpation  Cardiovascular: Good perfusion of upper extremities.  No cyanosis of the hands or fingers.  Lungs: No apparent stridorous breathing. No acute distress.  Skin: Skin warm and dry.  Neurologic: Cranial nerves:  grossly intact.  Psychiatric: Alert and oriented x 3.    Procedure:       Data Reviewed:  N/A    Assessment:  ***    Plan:  No orders of the defined types were placed in this encounter.    ***  F/U in *** for reevaluation.    Donnice Lack, MD  Department of Otolaryngology  [facility]    PCP: Vernell Sauce, MD  [address]  [place] [address]   REF: Sauce Vernell, MD  [address]  [place],  [place] [address]       ***                    [1]   Social History  Tobacco Use   Smoking Status Never   Smokeless Tobacco Never   [2] No Known Allergies

## 2024-04-23 NOTE — Progress Notes (Signed)
 [facility]  Post Op Clinic Note     Name: Steve Young MRN:  [id number]   Date: 04/23/2024 Age: 63 y.o.       HPI:  Steve Young is a 63 y.o. male who is seen status post C7-T1 instrumented fusion with decompression.  He is overall doing well.  Still significant neck pain, but arm symptoms improved significantly.  No fevers chills etcetera.      General Physical Exam:  Ht 1.753 m (5' 9)   Wt 91.6 kg (202 lb)   BMI 29.83 kg/m       Physical Exam:  Patient is still with weakness of grip interosseous on the right, though improved from preop.  No upper motor neuron signs today.  Incision is beautifully healed.    Imaging:  AP lateral cervical spine shows perfect positioning of instrumentation with no signs of any loosening or junctional instabilities.    Assessment/Plan:  ENCOUNTER DIAGNOSES     ICD-10-CM   1. S/P cervical spinal fusion  Z98.1   2. S/P laminectomy with spinal fusion  Z98.1       Doing very well status post decompression and fusion.  He can use the brace as needed at this point.  He does feel better in it most of the time.  I will plan to see him back in another 6 weeks.  At that point we will start some physical therapy.  We will start to wean down from his gabapentin .  He is on 800 t.i.d. and we will go to 600 t.i.d. which he has been previously put on by Dr. Lucinda.  Further weaning from that original dose I would leave to her.  Overall I am happy with his progress so far.    Electronically signed by Camellia [name], MD   04/23/2024    This note has been generated by a word recognition computerized program. There may be grammatical, typographical, or word substitution errors which have escaped my editorial review.

## 2024-04-24 ENCOUNTER — Ambulatory Visit (INDEPENDENT_AMBULATORY_CARE_PROVIDER_SITE_OTHER): Admitting: Otolaryngology

## 2024-04-24 VITALS — Temp 96.8°F | Ht 69.0 in | Wt 197.0 lb

## 2024-04-24 DIAGNOSIS — R633 Feeding difficulties, unspecified: Secondary | ICD-10-CM

## 2024-04-24 DIAGNOSIS — R1310 Dysphagia, unspecified: Secondary | ICD-10-CM

## 2024-04-24 MED ORDER — LIDOCAINE 4% AND PHENYLEPHRINE 1% NASAL SOLUTION
5.0000 [drp] | Freq: Once | NASAL | Status: AC
  Administered 2024-04-24: 5 [drp] via NASAL

## 2024-04-24 NOTE — Procedures (Signed)
 ENT, [facility]  [address] [place] [address]    Procedure Note    Name: Steve Young MRN:  [id number]   Date: 04/24/2024 DOB:  07/16/1960 (63 y.o.)         Generic Procedure    Performed by: Glena Cough, MD  Authorized by: Glena Cough, MD    Time Out:     Immediately before the procedure, a time out was called:  Yes    Patient verified:  Yes    Procedure Verified:  Yes    Site Verified:  Yes  Documentation:      Flexible Fiberoptic Nasopharyngolaryngoscopy   Anesthesia: Topical 4% lidocaine  and phenlyephrine  Findings: Visualization of the hypopharynx/larynx with mirror not adequate for examination and fiberoptic examination performed. After the nasal cavity was anesthetized/decongested with topical lidocaine  and neosynephrine, the scope was thus advanced through the RIGHT nasal passage  Nasopharynx:  Normal mucosa and no lesions of the nasopharyngeal walls. The Eustachian tube orifices/ fossae of Rosenmueller were normal.   Hypopharynx:  No lesions of the epiglottis, posterior pharyngeal walls, or piriform mucosa. There is no pooling of secretions.   Larynx:  The vocal cords are mobile and adduct to midline bilaterally.  There is good abduction with sniff. The airway is patent.   Subglottis:  Grossly normal with limited visualization.    No significant findings, clear    I am scribing for, and in the presence of Dr. Olson Lucarelli for services provided on 04/24/2024.  Stormy Bowser, SCRIBE   Stormy Bowser, SCRIBE  04/24/2024, 16:05       A time-out was performed before the procedure to validate patient identification and insure right patient, right procedure, right equipment.       Stormy Bowser, SCRIBE    I personally performed the services described in this documentation, as scribed  in my presence, and it is both accurate  and complete.    Cough Glena, MD

## 2024-04-25 DIAGNOSIS — Z981 Arthrodesis status: Secondary | ICD-10-CM

## 2024-05-04 ENCOUNTER — Other Ambulatory Visit: Payer: Self-pay

## 2024-05-04 ENCOUNTER — Ambulatory Visit (INDEPENDENT_AMBULATORY_CARE_PROVIDER_SITE_OTHER)

## 2024-05-04 VITALS — BP 160/100 | HR 87

## 2024-05-04 DIAGNOSIS — I10 Essential (primary) hypertension: Secondary | ICD-10-CM

## 2024-05-04 MED ORDER — LOSARTAN 100 MG TABLET: 100.0000 mg | ORAL_TABLET | Freq: Every day | ORAL | 3 refills | Status: AC

## 2024-05-04 NOTE — Progress Notes (Signed)
 Pt in for BP Check, 160/100, HR 87, has  been taking new meds    Increase dose of losartan  to 100 mg daily.  He can have another blood pressure check next week as nurse visit.  Thank you.

## 2024-05-04 NOTE — Addendum Note (Signed)
 Addended by: LUCINDA MILLMAN on: 05/04/2024 03:54 PM     Modules accepted: Orders

## 2024-05-04 NOTE — Progress Notes (Signed)
 [facility]  Phone: [phone]   Fax: [phone]        Called and spoke to patient. Advised per provider:  Marsa Durand, RN  05/04/2024, 15:59     Pt will pick up meds, appt scheduled

## 2024-05-11 ENCOUNTER — Ambulatory Visit (INDEPENDENT_AMBULATORY_CARE_PROVIDER_SITE_OTHER): Payer: Self-pay

## 2024-05-11 ENCOUNTER — Other Ambulatory Visit (INDEPENDENT_AMBULATORY_CARE_PROVIDER_SITE_OTHER): Payer: Self-pay | Admitting: Family Medicine

## 2024-05-11 ENCOUNTER — Other Ambulatory Visit: Payer: Self-pay

## 2024-05-11 VITALS — BP 152/88 | HR 90

## 2024-05-11 DIAGNOSIS — I10 Essential (primary) hypertension: Secondary | ICD-10-CM

## 2024-05-11 MED ORDER — AMLODIPINE 5 MG TABLET: 5.0000 mg | ORAL_TABLET | Freq: Every day | ORAL | 3 refills | Status: DC

## 2024-05-11 NOTE — Progress Notes (Signed)
 Pt in for BP check, 152/88 HR 90    Added amlodipine  daily and recheck BP next week as nurse visit. Thank you.

## 2024-05-14 ENCOUNTER — Ambulatory Visit (INDEPENDENT_AMBULATORY_CARE_PROVIDER_SITE_OTHER): Payer: Self-pay

## 2024-05-14 NOTE — Progress Notes (Signed)
 Left detailed vm for pt to schedule appt, made aware of new med sent    Marsa Durand, RN  05/14/2024 08:12

## 2024-05-15 ENCOUNTER — Ambulatory Visit (HOSPITAL_COMMUNITY): Payer: Self-pay

## 2024-05-18 ENCOUNTER — Ambulatory Visit (INDEPENDENT_AMBULATORY_CARE_PROVIDER_SITE_OTHER): Payer: Self-pay

## 2024-05-21 ENCOUNTER — Other Ambulatory Visit: Payer: Self-pay

## 2024-05-21 ENCOUNTER — Ambulatory Visit (INDEPENDENT_AMBULATORY_CARE_PROVIDER_SITE_OTHER): Admitting: Internal Medicine

## 2024-05-21 ENCOUNTER — Encounter (INDEPENDENT_AMBULATORY_CARE_PROVIDER_SITE_OTHER): Payer: Self-pay | Admitting: Internal Medicine

## 2024-05-21 VITALS — BP 130/110 | HR 87 | Temp 99.2°F | Resp 17 | Ht 69.02 in | Wt 195.4 lb

## 2024-05-21 DIAGNOSIS — I10 Essential (primary) hypertension: Secondary | ICD-10-CM

## 2024-05-21 DIAGNOSIS — E1142 Type 2 diabetes mellitus with diabetic polyneuropathy: Secondary | ICD-10-CM

## 2024-05-21 MED ORDER — AMLODIPINE 10 MG TABLET: 10.0000 mg | ORAL_TABLET | Freq: Every day | ORAL | 1 refills | Status: AC

## 2024-05-21 NOTE — Nursing Note (Signed)
 05/21/24 1127   Housing Stability   What is your housing situation? Has Housing   Are you worried about losing your housing? No   Employment   What is your current work situation? Other unemp.   Financial   In the past year, have you or any family members you live with been unable to get any of the following when it was really needed?  No   Transportation   Has lack of transportation kept you from medical appointments, meetings, work, or from getting things needed for daily living?  No   Social Connections   How often do you see or talk to people that you care about and feel close to? (For example: talking to friends on the phone, visiting friends or family, going to church or club meetings) >=5x a wk   Intimate Partner Violence   In the past year, have you been afraid of your partner or ex-partner? No   Do you feel physically safe and emotionally safe where you currently live? Yes   Help Needed   Would you like help with any of these needs? No   Are any of these needs urgent? No

## 2024-05-21 NOTE — Nursing Note (Signed)
 BP Check

## 2024-05-21 NOTE — Progress Notes (Signed)
 PRIMARY CARE, [facility]  [address]  [place] [address]      Chief Complaint:    Chief Complaint   Patient presents with    Blood Pressure Check     HPI:  Patient is here for evaluation regarding blood pressure.  He saw Dr. Talaman 04/20/2024 at which time losartan  was added to his regimen.  He had a subsequent nurse visit for BP check on 05/11/2024.  Blood pressure was elevated.  Amlodipine  was added to his regimen.  He is here for re-evaluation regarding medication adjustment.  Patient indicates that he took his medication around 530 this morning.  He is on losartan  and the amlodipine  with no missed doses.  Denies any salt intake in his diet.  He is not eating a lot of salty foods or additives.  He has switch from can foods to fresh foods.  He is not eating a lot of sweets.  He does have diabetes.  He has not been checking glucose levels at home.  Weight is down 7 lb since last visit to the office.  He is on semaglutide  for his diabetes.    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    Asthma     Asthma exacerbation 01/09/2023    Atherosclerosis of both carotid arteries 03/04/2020    Cervicalgia 03/21/2018    Chronic abdominal pain 10/04/2018    Chronic back pain 03/21/2018    Chronic chest pain 03/21/2018    Chronic insomnia     Constipation in male 09/13/2017    COVID 12/2022    COVID-19 04/28/2021    Degenerative cervical spinal stenosis 03/30/2023    Diverticulitis 01/09/2023    Elevated lactic acid level 01/09/2023    Esophageal reflux     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    Hepatic lesion 10/04/2018    Hyperlipidemia     Hypertension     Hypertriglyceridemia 05/15/2022    Hypokalemia 11/18/2020    Impingement syndrome of left shoulder 12/28/2017    Metabolic dysfunction-associated steatotic liver disease (MASLD) 10/04/2018     Mild anemia 02/07/2023    Mild persistent asthma without complication 03/21/2018    NAFL (nonalcoholic fatty liver)     Normal cardiac stress test 03/21/2018    Nosebleed     Obesity     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    Partial tear of common extensor tendon of elbow 12/13/2017    Pituitary tumor     Prediabetes 02/18/2020    Rib pain 10/04/2018    Sepsis 01/09/2023    Sexual dysfunction     Tear of talofibular ligament 05/30/2013    RIGHT    Type 2 diabetes mellitus with other specified complication, unspecified whether long term insulin  use (CMS HCC) 01/11/2023    Vitamin D  deficiency      Past Surgical History:   Procedure Laterality Date    COLONOSCOPY      ELBOW SURGERY Left 12/2017    HX OTHER  2004     SPINAL FUSION,ANT,EA ADNL LEVEL c3-c7    HX OTHER Right     right great toe    HX SHOULDER SURGERY Bilateral     NOSE SURGERY       Family Medical History:       Problem  Relation (Age of Onset)    Cancer Mother, Sister    Gout Father    Heart Disease Mother, Sister, Nephew          Social History[1]    No Known Allergies    Current Outpatient Medications   Medication Sig    albuterol  sulfate (PROVENTIL  OR VENTOLIN  OR PROAIR ) 90 mcg/actuation Inhalation oral inhaler Take 1-2 Puffs by inhalation Every 4 hours as needed    albuterol  sulfate (PROVENTIL ) 2.5 mg /3 mL (0.083 %) Inhalation nebulizer solution Take 3 mL (2.5 mg total) by nebulization Four times a day as needed for Wheezing Indications: asthma attack    amLODIPine  (NORVASC) 5 mg Oral Tablet Take 1 Tablet (5 mg total) by mouth Daily Indications: high blood pressure    aspirin  (ECOTRIN) 81 mg Oral Tablet, Delayed Release (E.C.) Take 1 Tablet (81 mg total) by mouth Daily    atorvastatin  (LIPITOR) 40 mg Oral Tablet Take 1 Tablet (40 mg total) by mouth Daily Indications: high cholesterol and high triglycerides, treatment to prevent a heart attack    budesonide -formoteroL  (SYMBICORT ) 160-4.5 mcg/actuation Inhalation oral inhaler Take 2 Puffs by  inhalation Twice daily Indications: controller medication for asthma    Doxepin  6 mg Oral Tablet Take 1 Tablet (6 mg total) by mouth Every night Indications: difficulty sleeping    gabapentin  (NEURONTIN ) 400 mg Oral Capsule Take 2 Capsules (800 mg total) by mouth Three times a day    Gauze Bandage Bandage bordered gauze with adhesive border use daily as needed    losartan  (COZAAR ) 100 mg Oral Tablet Take 1 Tablet (100 mg total) by mouth Daily Indications: high blood pressure    multivitamin-iron-folic acid  (CENTRUM COMPLETE) 18-400 mg-mcg Oral Tablet Take 1 Tablet by mouth Daily Indications: treatment to prevent vitamin deficiency, vitamin deficiency    omeprazole  (PRILOSEC) 40 mg Oral Capsule, Delayed Release(E.C.) Take 1 Capsule (40 mg total) by mouth Daily Indications: gastroesophageal reflux disease    polyethylene glycol (MIRALAX ) 17 gram Oral Powder in Packet Take 1 Packet (17 g total) by mouth Daily Indications: constipation, emptying of the bowel    potassium chloride  (K-DUR) 10 mEq Oral Tab Sust.Rel. Particle/Crystal Take 1 Tablet (10 mEq total) by mouth Twice daily    semaglutide  (OZEMPIC ) 1 mg/dose (4 mg/3 mL) Subcutaneous Pen Injector Inject 1 mg under the skin Every 7 days Indications: type 2 diabetes mellitus, weight loss management for a person with obesity    sennosides-docusate sodium  (SENOKOT-S) 8.6-50 mg Oral Tablet Take 1 Tablet by mouth Every night as needed Indications: constipation     Physical Exam:  BP (!) 130/110 (Site: Left Arm, Patient Position: Sitting, Cuff Size: Adult) Comment: took meds at 5:30AM  Pulse 87   Temp 37.3 C (99.2 F) (Temporal)   Resp 17   Ht 1.753 m (5' 9.02)   Wt 88.6 kg (195 lb 6.4 oz)   SpO2 97%   BMI 28.84 kg/m .  Repeat BP 138/86 on my exam.    Alert and pleasant.  Lungs:  Clear to auscultation bilaterally.  No wheezing, rales or rhonchi.  Cardiac:  Regular rate and rhythm.  No murmur.      ICD-10-CM    1. Essential hypertension  I10 amLODIPine  (NORVASC)  10 mg Oral Tablet      2. Type 2 diabetes mellitus with peripheral neuropathy (CMS HCC)  E11.42         BMP completed 03/12/2024 with bicarb 33 with normal calcium.  Glucose 145.  GFR >  90.     Last hemoglobin A1c 5.5% completed 02/09/2024.      Will continue same losartan  dosing.  Change amlodipine  to 10 mg daily.  Dosing adjustment reviewed with patient and new prescription sent to pharmacy.  Patient has not been checking glucose levels at home.  Continue current semaglutide  dosing.  Follow-up in 3 weeks or may return sooner as needed.    Rock [place], MD    This dictation was transcribed using voice recognition software and may contain minor typographical errors.       [1]   Social History  Tobacco Use    Smoking status: Never    Smokeless tobacco: Never   Vaping Use    Vaping status: Never Used   Substance Use Topics    Alcohol use: Not Currently    Drug use: Never

## 2024-05-23 LAB — PARACHUTE DME

## 2024-06-04 ENCOUNTER — Ambulatory Visit (HOSPITAL_COMMUNITY): Payer: Self-pay | Admitting: ORTHOPAEDIC SURGERY

## 2024-06-06 ENCOUNTER — Other Ambulatory Visit (INDEPENDENT_AMBULATORY_CARE_PROVIDER_SITE_OTHER): Payer: Self-pay | Admitting: Family Medicine

## 2024-06-06 DIAGNOSIS — F4310 Post-traumatic stress disorder, unspecified: Secondary | ICD-10-CM

## 2024-06-06 DIAGNOSIS — M792 Neuralgia and neuritis, unspecified: Secondary | ICD-10-CM

## 2024-06-06 DIAGNOSIS — F5104 Psychophysiologic insomnia: Secondary | ICD-10-CM

## 2024-06-06 NOTE — Telephone Encounter (Signed)
 Last Visit:05/21/2024     Upcoming appointments: 06/11/2024           Steve Fehrenbach, MA  06/06/2024, 08:08

## 2024-06-11 ENCOUNTER — Encounter (INDEPENDENT_AMBULATORY_CARE_PROVIDER_SITE_OTHER): Payer: Self-pay | Admitting: Internal Medicine

## 2024-06-12 ENCOUNTER — Other Ambulatory Visit: Payer: Self-pay

## 2024-06-12 ENCOUNTER — Ambulatory Visit
Admission: RE | Admit: 2024-06-12 | Discharge: 2024-06-12 | Disposition: A | Discharge status: Home / Self Care | Source: Ambulatory Visit | Attending: Otolaryngology | Admitting: Otolaryngology

## 2024-06-12 ENCOUNTER — Other Ambulatory Visit (INDEPENDENT_AMBULATORY_CARE_PROVIDER_SITE_OTHER): Payer: Self-pay | Admitting: Family Medicine

## 2024-06-12 DIAGNOSIS — F4310 Post-traumatic stress disorder, unspecified: Secondary | ICD-10-CM

## 2024-06-12 DIAGNOSIS — E1142 Type 2 diabetes mellitus with diabetic polyneuropathy: Secondary | ICD-10-CM

## 2024-06-12 DIAGNOSIS — F5104 Psychophysiologic insomnia: Secondary | ICD-10-CM

## 2024-06-12 DIAGNOSIS — R1310 Dysphagia, unspecified: Secondary | ICD-10-CM | POA: Insufficient documentation

## 2024-06-12 DIAGNOSIS — R633 Feeding difficulties, unspecified: Secondary | ICD-10-CM | POA: Insufficient documentation

## 2024-06-12 DIAGNOSIS — M792 Neuralgia and neuritis, unspecified: Secondary | ICD-10-CM

## 2024-06-12 MED ORDER — BARIUM SULFATE 40 % (W/V), 30% (W/W) ORAL PASTE
150.0000 mL | PASTE | ORAL | Status: AC
  Administered 2024-06-12: 150 mL via ORAL

## 2024-06-12 MED ORDER — BARIUM SULFATE 81 % (W/W) ORAL POWDER
30.0000 mL | ORAL | Status: AC
  Administered 2024-06-12: 30 mL via ORAL

## 2024-06-12 NOTE — Speech Evaluation (Signed)
 [facility]  OUTPATIENT MODIFIED BARIUM SWALLOW    Patient Name: Steve Young  DOB: [birth year]  MRN: [id number]  Initial Date of tx: 06/12/2024  Date of Onset: 04/24/24  Primary Care Physician: Vernell Sauce, MD  Referring Provider: Dr. Glena     Diagnosis:     ICD-10-CM    1. Swallowing problem  R13.10       2. Dysphagia, unspecified type  R13.10       3. Feeding difficulty  R63.30         HPI: This patient is a 64 year old male who presented to [facility] for an outpatient MBS. He was referred by his ENT due to complaints of solid food and pill dysphagia. Patient reports this has been chronic since his C3-C7 ACDF . Patient denies any unintentional weight loss and denies any recent respiratory infections. Flexible laryngoscopy completed 12/9 was grossly normal. Patient denies prior history of GERD.     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    Asthma     Asthma exacerbation 01/09/2023    Atherosclerosis of both carotid arteries 03/04/2020    Cervicalgia 03/21/2018    Chronic abdominal pain 10/04/2018    Chronic back pain 03/21/2018    Chronic chest pain 03/21/2018    Chronic insomnia     Constipation in male 09/13/2017    COVID 12/2022    COVID-19 04/28/2021    Degenerative cervical spinal stenosis 03/30/2023    Diverticulitis 01/09/2023    Elevated lactic acid level 01/09/2023    Esophageal reflux     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    Hepatic lesion 10/04/2018    Hyperlipidemia     Hypertension     Hypertriglyceridemia 05/15/2022    Hypokalemia 11/18/2020    Impingement syndrome of left shoulder 12/28/2017    Metabolic dysfunction-associated steatotic liver disease (MASLD) 10/04/2018    Mild anemia 02/07/2023    Mild persistent asthma without complication 03/21/2018    NAFL (nonalcoholic fatty liver)     Normal cardiac stress test  03/21/2018    Nosebleed     Obesity     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    Partial tear of common extensor tendon of elbow 12/13/2017    Pituitary tumor     Prediabetes 02/18/2020    Rib pain 10/04/2018    Sepsis 01/09/2023    Sexual dysfunction     Tear of talofibular ligament 05/30/2013    RIGHT    Type 2 diabetes mellitus with other specified complication, unspecified whether long term insulin  use (CMS HCC) 01/11/2023    Vitamin D  deficiency          Level of Consciousness: Awake, Alert, Responsive   Behavior: Cooperative  Respiration: Room Air      Oral Phase Pharyngeal Phase Causing Penetration-Aspiration Scale (PAS) Score:   Thin (lateral, A-P)    Consecutive     Normal     Weak base of tongue  Decreased epiglottic movement  Reduced Pharyngeal Stripping Wave  Decrease movement of hyolaryngeal excursion  Decreased upper esophageal sphincter distance and duration opening     Base of tongue residuals  Pharyngeal Residual: Mild (<25% of height of valleculae/pyriforms)         Penetrates larynx, remains above vocal folds, no visible laryngeal residuals  Pureed (A-P)   Within functional Limits   Weak base of tongue  Decrease movement of hyolaryngeal excursion     Base of tongue residuals  Pharyngeal Residual: Mild (<25% of height of valleculae/pyriforms)    Does not enter airway     Coarse Solid (lateral)   Normal   Weak base of tongue  Decreased epiglottic movement  Delayed initiation of hyolaryngeal excursion  Reduced Pharyngeal Stripping Wave  Decrease movement of hyolaryngeal excursion   Base of tongue residuals  Pharyngeal Residual: Mild (<25% of height of valleculae/pyriforms)    Does not enter airway       ESOPHAGEAL SCREEN   Esophageal Clearance  Esophageal Retention  Esophageal retention with retrograde flow below UES    IMPRESSIONS: This patient presents with a mild-moderate oropharyngeal dysphagia. There was no aspiration. Thin liquids resulted in x 1 instance of shallow, transient  laryngeal penetration. This occurred secondary to reduced epiglottic inversion. His primary deficit is reduced anterior movement of hyolaryngeal excursion causing reduced epiglottic inversion and reduced pharyngeal propulsion. The patient required multiple dry swallows to clear pharyngeal stasis of solids. It should be noted that even after pharyngeal stasis was cleared, he continued to complain of globus sensation within the pharynx. Patient was assessed in A-P view with solids and liquids. There was significant esophageal stasis and retrograde flow of material within th esophagus.   The patient's pharyngeal deficits are likely secondary to his prior anterior cervical neck fusion. Diet consistency modification is not indicated however he would benefit from oropharyngeal strengthening on rehab basis.   Although patient does demonstrate pharyngeal deficits, suspect an esophageal component is contributing to his significant globus sensation. Recommend full evaluation of the esophageal swallow.     RECOMMENDATIONS:  Dietary Recommendations:  Liquid Recommendation: Thin Liquids  Solids Recommendation: Regular Texture Solids   Medication Recommendations: Whole in yogurt or applesauce  Aspiration Precautions: Upright as close to 90 degrees as possible during mealtimes, Slow rate of consumption, Alternate between solids and liquids  Referrals: Consult GI    PLAN OF TREATMENT:  Recommend outpatient ST for oropharyngeal strengthening.  Recommend evaluation of the esophageal swallow.     LTG: Patient will improve oropharyngeal swallowing function  Patient will tolerate least restrictive diet without overt s/s of aspiration    Thank you for this consultation.    Meghan Lucsko, MS, [facility] - SLP  06/12/2024 13:48

## 2024-06-13 NOTE — Telephone Encounter (Signed)
 Last Visit:05/21/2024     Upcoming appointments: 06/14/2024           Lolita Macadam, MA  06/13/2024, 07:45

## 2024-06-14 ENCOUNTER — Other Ambulatory Visit: Payer: Self-pay

## 2024-06-14 ENCOUNTER — Ambulatory Visit (INDEPENDENT_AMBULATORY_CARE_PROVIDER_SITE_OTHER): Admitting: Internal Medicine

## 2024-06-14 ENCOUNTER — Encounter (INDEPENDENT_AMBULATORY_CARE_PROVIDER_SITE_OTHER): Payer: Self-pay | Admitting: Internal Medicine

## 2024-06-14 ENCOUNTER — Telehealth (INDEPENDENT_AMBULATORY_CARE_PROVIDER_SITE_OTHER): Payer: Self-pay | Admitting: Otolaryngology

## 2024-06-14 VITALS — BP 122/70 | HR 73 | Temp 97.7°F | Resp 18 | Ht 69.02 in | Wt 201.0 lb

## 2024-06-14 DIAGNOSIS — E236 Other disorders of pituitary gland: Secondary | ICD-10-CM

## 2024-06-14 DIAGNOSIS — I10 Essential (primary) hypertension: Secondary | ICD-10-CM

## 2024-06-14 DIAGNOSIS — E1169 Type 2 diabetes mellitus with other specified complication: Secondary | ICD-10-CM

## 2024-06-14 DIAGNOSIS — E785 Hyperlipidemia, unspecified: Secondary | ICD-10-CM

## 2024-06-14 NOTE — Progress Notes (Signed)
 PRIMARY CARE, [facility]  [address]  [place] [id number]      Chief Complaint:    Chief Complaint   Patient presents with    Follow Up     HPI:  Patient presents for follow-up regarding hypertension.  Amlodipine  dose increased at last visit.  He remains on losartan .  He reports compliance with medication with no missed doses.  Denies any edema.  He has diabetes.  He is not checking glucose levels at home.  He is on semaglutide .  He has prior identification a Rathke's cleft cyst.  He had seen Neurosurgery in the past.  Last visit with specialist was in 2021.  He missed the follow-up appointment.  He wanted to get scheduled with the specialist to reestablish follow-up but they indicated that he needed a new referral as well as new MRI imaging.     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    Asthma     Asthma exacerbation 01/09/2023    Atherosclerosis of both carotid arteries 03/04/2020    Cervicalgia 03/21/2018    Chronic abdominal pain 10/04/2018    Chronic back pain 03/21/2018    Chronic chest pain 03/21/2018    Chronic insomnia     Constipation in male 09/13/2017    COVID 12/2022    COVID-19 04/28/2021    Degenerative cervical spinal stenosis 03/30/2023    Diverticulitis 01/09/2023    Elevated lactic acid level 01/09/2023    Esophageal reflux     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    Hepatic lesion 10/04/2018    Hyperlipidemia     Hypertension     Hypertriglyceridemia 05/15/2022    Hypokalemia 11/18/2020    Impingement syndrome of left shoulder 12/28/2017    Metabolic dysfunction-associated steatotic liver disease (MASLD) 10/04/2018    Mild anemia 02/07/2023    Mild persistent asthma without complication 03/21/2018    NAFL (nonalcoholic fatty liver)     Normal cardiac stress test 03/21/2018    Nosebleed     Obesity      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    Partial tear of common extensor tendon of elbow 12/13/2017    Pituitary tumor     Prediabetes 02/18/2020    Rib pain 10/04/2018    Sepsis 01/09/2023    Sexual dysfunction     Tear of talofibular ligament 05/30/2013    RIGHT    Type 2 diabetes mellitus with other specified complication, unspecified whether long term insulin  use (CMS HCC) 01/11/2023    Vitamin D  deficiency      Past Surgical History:   Procedure Laterality Date    COLONOSCOPY      ELBOW SURGERY Left 12/2017    HX OTHER  2004     SPINAL FUSION,ANT,EA ADNL LEVEL c3-c7    HX OTHER Right     right great toe    HX SHOULDER SURGERY Bilateral     NOSE SURGERY       Family Medical History:       Problem Relation (Age of Onset)    Cancer Mother, Sister    Gout Father    Heart Disease Mother, Sister, Nephew          Social History[1]    No Known Allergies    Current Outpatient Medications  Medication Sig    albuterol  sulfate (PROVENTIL  OR VENTOLIN  OR PROAIR ) 90 mcg/actuation Inhalation oral inhaler Take 1-2 Puffs by inhalation Every 4 hours as needed    albuterol  sulfate (PROVENTIL ) 2.5 mg /3 mL (0.083 %) Inhalation nebulizer solution Take 3 mL (2.5 mg total) by nebulization Four times a day as needed for Wheezing Indications: asthma attack    amLODIPine  (NORVASC) 10 mg Oral Tablet Take 1 Tablet (10 mg total) by mouth Daily Indications: high blood pressure    aspirin  (ECOTRIN) 81 mg Oral Tablet, Delayed Release (E.C.) Take 1 Tablet (81 mg total) by mouth Daily    atorvastatin  (LIPITOR) 40 mg Oral Tablet Take 1 Tablet (40 mg total) by mouth Daily Indications: high cholesterol and high triglycerides, treatment to prevent a heart attack    budesonide -formoteroL  (SYMBICORT ) 160-4.5 mcg/actuation Inhalation oral inhaler Take 2 Puffs by inhalation Twice daily Indications: controller medication for asthma    Doxepin  6 mg Oral Tablet Take 1 Tablet (6 mg total) by mouth Every night Indications: difficulty sleeping     gabapentin  (NEURONTIN ) 400 mg Oral Capsule Take 2 Capsules (800 mg total) by mouth Three times a day    Gauze Bandage Bandage bordered gauze with adhesive border use daily as needed    losartan  (COZAAR ) 100 mg Oral Tablet Take 1 Tablet (100 mg total) by mouth Daily Indications: high blood pressure    multivitamin-iron-folic acid  (CENTRUM COMPLETE) 18-400 mg-mcg Oral Tablet Take 1 Tablet by mouth Daily Indications: treatment to prevent vitamin deficiency, vitamin deficiency    omeprazole  (PRILOSEC) 40 mg Oral Capsule, Delayed Release(E.C.) Take 1 Capsule (40 mg total) by mouth Daily Indications: gastroesophageal reflux disease    polyethylene glycol (MIRALAX ) 17 gram Oral Powder in Packet Take 1 Packet (17 g total) by mouth Daily Indications: constipation, emptying of the bowel    potassium chloride  (K-DUR) 10 mEq Oral Tab Sust.Rel. Particle/Crystal Take 1 Tablet (10 mEq total) by mouth Twice daily    semaglutide  (OZEMPIC ) 1 mg/dose (4 mg/3 mL) Subcutaneous Pen Injector Inject 1 mg under the skin Every 7 days Indications: type 2 diabetes mellitus, weight loss management for a person with obesity    sennosides-docusate sodium  (SENOKOT-S) 8.6-50 mg Oral Tablet Take 1 Tablet by mouth Every night as needed Indications: constipation     Physical Exam:  BP 122/70   Pulse 73   Temp 36.5 C (97.7 F) (Temporal)   Resp 18   Ht 1.753 m (5' 9.02)   Wt 91.2 kg (201 lb)   SpO2 98%   BMI 29.67 kg/m     Alert and pleasant.  HEENT:  No icterus or jaundice.  No eye drainage.  Nares without drainage.  Mucous membranes moist.  Lungs:  Clear to auscultation bilaterally.  No wheezing, rales or rhonchi.  Cardiac:  Regular rate and rhythm.  No murmur.  Extremities:  Warm with no cyanosis or edema.  No erythema.  Capillary refill less than 3 seconds.      ICD-10-CM    1. Essential hypertension  I10       2. 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  Blood pressure improved.  Will continue to monitor.  Patient is not checking glucose  levels at home.  Will continue semaglutide  for diabetic management.  Patient with hyperlipidemia as well.  Continue atorvastatin .  MRI imaging ordered for follow-up regarding Rathke's cleft cyst.  Referral to Neurosurgery ordered as well.  Will schedule appointment for next routine follow-up.  May return sooner as needed.    [place], MD    This dictation was transcribed using voice recognition software and may contain minor typographical errors.       [1]   Social History  Tobacco Use    Smoking status: Never    Smokeless tobacco: Never   Vaping Use    Vaping status: Never Used   Substance Use Topics    Alcohol use: Not Currently    Drug use: Never

## 2024-06-14 NOTE — Nursing Note (Signed)
 Follow up.

## 2024-06-14 NOTE — Telephone Encounter (Signed)
 Left voicemail for patient to call office to schedule RPV for results with Dr. Cinda Quest.

## 2024-06-15 ENCOUNTER — Encounter (HOSPITAL_COMMUNITY): Payer: Self-pay

## 2024-06-18 ENCOUNTER — Encounter (HOSPITAL_COMMUNITY): Payer: Self-pay

## 2024-06-19 ENCOUNTER — Telehealth (INDEPENDENT_AMBULATORY_CARE_PROVIDER_SITE_OTHER): Payer: Self-pay | Admitting: Internal Medicine

## 2024-06-19 NOTE — Telephone Encounter (Signed)
 Received correspondence from [facility].  Patient has been feeling different doses of amlodipine .  Please review with patient that he is only to take one dose of amlodipine .  He is supposed to be on amlodipine  10 mg daily.  Please review with patient as well as [facility].

## 2024-06-22 ENCOUNTER — Encounter (INDEPENDENT_AMBULATORY_CARE_PROVIDER_SITE_OTHER): Payer: Self-pay | Admitting: Otolaryngology

## 2024-07-11 ENCOUNTER — Ambulatory Visit

## 2024-07-16 ENCOUNTER — Ambulatory Visit (INDEPENDENT_AMBULATORY_CARE_PROVIDER_SITE_OTHER): Payer: Self-pay | Admitting: Neurological Surgery

## 2024-09-18 ENCOUNTER — Encounter (INDEPENDENT_AMBULATORY_CARE_PROVIDER_SITE_OTHER): Payer: Self-pay | Admitting: Family Medicine
